# Patient Record
Sex: Male | Born: 1960 | Race: White | Hispanic: No | Marital: Married | Smoking: Former smoker
Health system: Southern US, Community
[De-identification: ages and names within clinical notes are randomized; demographics above are authoritative.]

## PROBLEM LIST (undated history)

## (undated) DIAGNOSIS — D649 Anemia, unspecified: Secondary | ICD-10-CM

## (undated) DIAGNOSIS — E119 Type 2 diabetes mellitus without complications: Secondary | ICD-10-CM

## (undated) DIAGNOSIS — E785 Hyperlipidemia, unspecified: Secondary | ICD-10-CM

## (undated) DIAGNOSIS — K219 Gastro-esophageal reflux disease without esophagitis: Secondary | ICD-10-CM

## (undated) DIAGNOSIS — K449 Diaphragmatic hernia without obstruction or gangrene: Secondary | ICD-10-CM

## (undated) DIAGNOSIS — F32A Depression, unspecified: Secondary | ICD-10-CM

## (undated) DIAGNOSIS — I10 Essential (primary) hypertension: Secondary | ICD-10-CM

## (undated) DIAGNOSIS — R413 Other amnesia: Secondary | ICD-10-CM

## (undated) DIAGNOSIS — F329 Major depressive disorder, single episode, unspecified: Secondary | ICD-10-CM

## (undated) DIAGNOSIS — R569 Unspecified convulsions: Secondary | ICD-10-CM

## (undated) DIAGNOSIS — J439 Emphysema, unspecified: Secondary | ICD-10-CM

## (undated) DIAGNOSIS — E079 Disorder of thyroid, unspecified: Secondary | ICD-10-CM

## (undated) HISTORY — PX: DENTAL SURGERY: SHX609

## (undated) HISTORY — DX: Other amnesia: R41.3

## (undated) HISTORY — PX: TONSILLECTOMY AND ADENOIDECTOMY: SHX28

## (undated) HISTORY — PX: EYE SURGERY: SHX253

## (undated) HISTORY — PX: APPENDECTOMY: SHX54

---

## 1999-12-27 ENCOUNTER — Inpatient Hospital Stay (HOSPITAL_COMMUNITY): Admission: AD | Admit: 1999-12-27 | Discharge: 2000-01-01 | Payer: Self-pay | Admitting: *Deleted

## 2003-07-29 ENCOUNTER — Inpatient Hospital Stay (HOSPITAL_COMMUNITY): Admission: EM | Admit: 2003-07-29 | Discharge: 2003-08-03 | Payer: Self-pay | Admitting: Emergency Medicine

## 2003-08-03 ENCOUNTER — Inpatient Hospital Stay (HOSPITAL_COMMUNITY): Admission: EM | Admit: 2003-08-03 | Discharge: 2003-08-10 | Payer: Self-pay | Admitting: Psychiatry

## 2005-07-12 ENCOUNTER — Emergency Department: Payer: Self-pay | Admitting: Emergency Medicine

## 2006-02-19 ENCOUNTER — Ambulatory Visit: Payer: Self-pay | Admitting: Internal Medicine

## 2010-03-01 ENCOUNTER — Ambulatory Visit (HOSPITAL_BASED_OUTPATIENT_CLINIC_OR_DEPARTMENT_OTHER): Admission: RE | Admit: 2010-03-01 | Discharge: 2010-03-01 | Payer: Self-pay | Admitting: Ophthalmology

## 2010-03-04 ENCOUNTER — Ambulatory Visit: Payer: Self-pay | Admitting: Internal Medicine

## 2011-02-09 NOTE — Discharge Summary (Signed)
NAME:  [name], [name] NO.:  [id number]   MEDICAL RECORD NO.:  [id number]                   PATIENT TYPE:  IPS   LOCATION:  0304                                 FACILITY:  [facility]   PHYSICIAN:  Jeanice Lim, M.D.              DATE OF BIRTH:  03/22/1961   DATE OF ADMISSION:  08/03/2003  DATE OF DISCHARGE:  08/10/2003                                 DISCHARGE SUMMARY   IDENTIFYING DATA:  This is a 50 year old Caucasian  male, married,  voluntarily admitted with a history of  opiate addiction, presented to the  emergency room with chest and abdominal pain, history of communicating  threats to a questionable  daughter. He says that they called him stupid.  The patient had been behaving erratically. He had been accused of possibly  stealing morphine. The patient denied any suicidal or homicidal ideation and  has been taking Xanax, which he reports is everywhere, possibly getting from  the street.   FAMILY HISTORY:  Bipolar.   MEDICATIONS:  Synthroid, Xanax, Colace, Protonix, Lexapro, aspirin,  methadone 80 mg daily.   ALLERGIES:  PENICILLIN.   PHYSICAL EXAMINATION:  Within normal limits. Neurologically nonfocal.   LABORATORY DATA:  Routine admission labs within normal limits.   MENTAL STATUS EXAM:  Fully alert, disheveled, tearful, cooperative. Speech  within normal limits. Mood irritable, depressed, tearful. Thought process  goal directed, positive thought blocking at times. No clear suicidal  ideation. Questionably passively suicidal with no acute intent and no  psychotic symptoms. Cognitively intact. Judgment and insight fair to poor.   ADMISSION DIAGNOSIS:   AXIS I:  1. Major depressive disorder, rule out bipolar.  2. Rule out benzodiazepine dependence, possible withdrawal syndrome and     substance induced mood disorder.   AXIS II:  Deferred.   AXIS III:  1. Hypertension.  2. History of myocardial infarction.  3. Constipation.  4.  Hypothyroidism.   AXIS IV:  Severe. Family conflict, occupational problems, economic problems  and possibly legal problems.   AXIS V:  30/60.   HOSPITAL COURSE:  The patient was admitted and ordered routine p.r.n.  medications and underwent further monitoring. He was encouraged to  participate in individual, group and milieu therapy. The patient was  optimized on medications, resumed on medical medications and placed on detox  protocol. The patient reported stabilization of mood with no mood swings, no  suicidal or homicidal ideation or psychotic symptoms and no side-effects  from medications.   His condition on discharge was improved. His mood was more euthymic, affect  brighter, thought process is goal directed, thought content negative for any  psychotic symptoms or dangerous ideation. The wife was informed and was  comfortable with discharge  plan.   DISCHARGE MEDICATIONS:  1. Protonix 40 mg q.a.m.  2. Colace 100 mg 1 b.i.d.  3. Depakote ER 250 mg b.i.d. and 2 q.h.s.  4. Risperdal  2 mg q.h.s.  5. Levothyroxine 200 mcg q.a.m.  6. Avapro 150 mg q.a.m.  7. Hydrochlorothiazide 12.5 mg q.a.m.  8. Lexapro 10 mg 1/2  q.a.m.  9. Aspirin 81 mg q.a.m.   FOLLOW UP:  The patient was discharged to follow up with [facility], with a  substance abuse  therapist.   DISCHARGE DIAGNOSES:   AXIS I:  1. Major depressive disorder, rule out bipolar.  2. Rule out benzodiazepine dependence, possible withdrawal syndrome and     substance induced mood disorder.   AXIS II:  Deferred.   AXIS III:  1. Hypertension.  2. History of myocardial infarction.  3. Constipation.  4. Hypothyroidism.   AXIS IV:  Severe. Family conflict, occupational problems, economic problems  and possibly legal problems.   AXIS VZachary Ayush, M.D.    JEM/MEDQ  D:  08/29/2003  T:  08/30/2003  Job:  [id number]

## 2011-02-09 NOTE — H&P (Signed)
NAME:  [name], [name] NO.:  [id number]   MEDICAL RECORD NO.:  [id number]                   PATIENT TYPE:  INP   LOCATION:  0357                                 FACILITY:  [facility]   PHYSICIAN:  Melissa L. Ladona Ridgel, MD               DATE OF BIRTH:  May 09, 1961   DATE OF ADMISSION:  07/29/2003  DATE OF DISCHARGE:                                HISTORY & PHYSICAL   PRIMARY CARE PHYSICIAN:  The patient is unable to identify his [facility] PCP.  He does, however, see Dr. Evelene Croon for his psychiatric care.   CHIEF COMPLAINT:  Abdominal pain and nausea.   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  He presents to the emergency room  with the complaint of intense abdominal pain without nausea, vomiting,  diarrhea, fever, or chills.  He is a very poor historian who is unable to  report when the pain started or rate the pain according to a 10 pain scale.  He denies shortness of breath, PND, or orthopnea.  He has pertinent family  history for stroke and MI in family members at a young age.  The patient is  obviously depressed, with psychomotor retardation, memory deficit, and  tearfulness.  He denies homicidal or suicidal ideation.  He is, however,  very tearful.  He was given permission to contact his wife, Darl Pikes, through a  family friend by the name of Kirstie Peri at [employer], telephone  number [phone].   PAST MEDICAL HISTORY:  1. Hypothyroidism.  2. Hypertension.  3. Depression.  4. Trauma to the lower extremities.   PAST SURGICAL HISTORY:  None.   ALLERGIES:  PENICILLIN.   MEDICATIONS:  1. Synthroid 100 mcg daily.  2. Wellbutrin XL 100 mg daily.  3. Xanax 2 mg daily.  4. Lexapro 10 mg q.h.s.  5. Diovan 80/12.5 mg daily.  6. Methadone 85 mg daily.   SOCIAL HISTORY:  The patient relates that he  smokes about three packs per  day of tobacco.  He started that nine years ago.  He denies any ethanol  abuse.  No IVDA or illicit drug use.   FAMILY HISTORY:  His father is living.  He is status post MI in his 8s and  presently is recovering from CABG.  His mother is deceased secondary to  stroke at age 69.  He has a brother who is alive but living with mental  retardation.   REVIEW OF SYSTEMS:  The patient expresses depression.  Abdominal pain which  radiates to the side.  He states he has chronic pain with urination and  generally has trouble initiating his stream.  He states this has been since  a child.  He denies shortness of breath, diarrhea, melena, hematochezia.  All other  review of systems are negative.   LABORATORY VALUES:  On admission reveal white count of 8, hemoglobin of  11.3, hematocrit of 32.5, and platelet count of 205.  His basic metabolic  panel reveals a BUN of 35, creatinine of 1.5, glucose of 93.  All other  values are within normal parameters.  Liver function tests are within normal  parameters.  His lipase is 22.  His UA is negative.  His tox screen is  positive for benzodiazepines.  His first set of cardiac enzymes revealed a  CK total of 1378 and a CK-MB of 12.7 and troponin of less than 0.01.   EKG shows heart rate of 83, with no ST and T-wave changes and normal sinus  rhythm.   I was able to contact the patient's wife of seven years.  Her cell phone  number is [phone].  Her work number is [phone].  She related that the  patient developed acute onset of verbally abusive behavior, which generally  addressed issues surrounding his daughter's recent pregnancy loss.  His  threats were specific enough and scary enough that his daughter took out a  warrant against him, alleging terroristic and violent threats.  He also  behaves in an inappropriate manner in front of his wife's employer, accusing  his wife of stealing morphine from the vet where she works.   The patient was  served a warrant and taken before a physician for evaluation.  He was  released as a [employer] broadcasting/film/video.  His wife alleges that she initiated an  involuntary commitment in [place].  It is unclear whether the two events  described are the same event.  The patient relates that the patient himself  has acted this way only once before, and that was when he was taking crystal  meth.  She is not aware that he has been taking anything other than his  methadone which he gets at the [facility] [place].  According to the  patient's wife, the patient takes 85 mg daily of methadone.  The current  telephone number for the [facility] is [phone].  Interestingly enough, the  patient's drug screen is negative for opiates.  The patient's wife relates  that there is a family history of bipolar disease which was related to her  from the patient's stepfather.  The patient's wife confirmed his statements  regarding small strokes and complaints of abdominal pain for at least two  days.  The patient's wife states that she has recently applied for Medicare  for them both, and this was initiated on June 24, 2003, but the  paperwork is yet to be completed.   PHYSICAL EXAM:  VITAL SIGNS:  Temperature 98.4, blood pressure 123/83, with  earlier readings in the high 90s systolic.  Pulse is 82, respirations are  20.  He is 97% on room air.  GENERAL:  Well-developed, well-nourished white male in mild distress  secondary to abdominal pain.  HEENT:  Pupils are equal, round, reactive to light.  His extraocular muscles  are intact.  His mucous membranes are moist.  He is anicteric.  There are no  oral or nasal lesions.  NECK:  Supple.  There is no JVD, no lymph node, no bruits, no macroglossia.  CHEST:  Mild rhonchi which clear with cough.  Occasionally there is a wheeze  best heard anteriorly and over the right neck.  There does appear to be an asymmetry to the right lobe of the thyroid which is palpably larger  than  the  left.  CARDIOVASCULAR:  Regular rate and rhythm.  Positive S1, S2.  No S3 or S4.  No murmurs, rubs, or gallops.  ABDOMEN:  Soft.  Mildly tender in the midepigastric area.  Nondistended,  with positive bowel sounds.  EXTREMITIES:  Revealed 2+ pulses with no edema and no other lesions.  NEUROLOGIC:  Cranial nerves II-XII are intact.  He is awake but not oriented  to time or place.  Power is 5/5 with deep tendon reflexes that are 1 in all  extremities and are symmetrical.   ASSESSMENT AND PLAN:  This is a 50 year old white male with an unusual  affect, memory loss, depression, tearfulness, who presents with atypical  abdominal pain that may represent anginal equivalent.  He will be admitted  to telemetry to rule out myocardial infarction.  We will assess his  abdominal source for his atypical abdominal pain with a CT of his abdomen.  We will also evaluate his mental health issues trending the last 48 hours.   1. Cardiovascular:  We will obtain serial enzymes and place the patient on     telemetry to rule out MI.  We will continue his Diovan,     hydrochlorothiazide if his blood pressure holds greater than a systolic     of 100.  We will also check his TSH.  2. Endocrine:  Continue on Synthroid.  We will follow up his TSH.  If the     patient continues to deteriorate we will consider myxedema coma,  which     would also alter our therapy.  3. The patient is opiate dependent on methadone; however, his drug screen     here in the ER shows no opiates which raises the question as to where his     medications are going.  We will call [facility] in the morning to confirm the     patient's enrollment in the drug program.  4. 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  A head CT will be     examined while the patient is undergoing abdominal CT to ascertain     whether there is any new finding that could be  consistent for stroke or     mass effect.  5. Psychiatric:  The patient is obviously depressed, with recent violent     threats.  It is unclear whether there is a physical source for this or a     psychiatric cause for this.  We will at this time continue his Lexapro,     his p.r.n. Ativan.  Continue his Wellbutrin, and Dr. Jeanie Sewer has been     consulted to see him in the morning.  We will not hesitate to pursue an     involuntary commitment should the patient repeat his behavior of the last     48 hours with verbal abusiveness.  6. Abdominal pain:  Question of whether or not this is constipation versus     opiate withdrawal versus another etiology.  Abdominal CT is pending.                                               Melissa L. Ladona Ridgel, MD    MLT/MEDQ  D:  07/30/2003  T:  07/30/2003  Job:  [id number]

## 2011-02-09 NOTE — Discharge Summary (Signed)
NAME:  [name] NO.:  [id number]   MEDICAL RECORD NO.:  [id number]                   PATIENT TYPE:  INP   LOCATION:  0357                                 FACILITY:  [facility]   PHYSICIAN:  Deirdre Peer. Polite, M.D.              DATE OF BIRTH:  1961-06-08   DATE OF ADMISSION:  07/29/2003  DATE OF DISCHARGE:  08/03/2003                                 DISCHARGE SUMMARY   DISCHARGE DIAGNOSES:  1. Abdominal pain secondary to constipation.  2. Severe hypothyroidism.  3. Hypertension.  4. Depression.  5. Elevated CK with normal troponin I probably secondary to severe     hypothyroidism.   DISCHARGE MEDICATIONS:  1. Protonix 40 mg every day.  2. Methadone 30 mg every day.  3. Synthroid 25 mcg every day.  4. Lactulose 30 mg b.i.d.  5. Lexapro 5 mg every day.   CONSULTATIONS:  Dr. Jeanie Sewer, Psychiatry.   DISPOSITION:  To [facility].   HISTORY OF PRESENT ILLNESS:  This 50 year old male with history of above  medical problems including mini strokes which resulted in __________  presented to the ED with complaint of intense abdominal pain without nausea,  vomiting, diarrhea, fever, or chills.  As stated, the patient is a poor  historian with a questionable history of mini strokes.  In the ED, the  patient was seen and evaluated, and admission was deemed necessary for  further evaluation and treatment.  Please see dictated report for further  details of history of present illness.   HOSPITAL COURSE:  Problem #1:  Severe abdominal pain.  As stated, this was  the patient's presenting complaint.  The patient has several studies for  evaluation of this.  Amylase, lipase, LFTs which were within normal limits.  Patient had x-ray and ultimately CAT scan which was consistent with  constipation.  Patient was treated with laxative with good result.  There  was no further evaluation for the patient's abdominal pain.  Patient also  had severe  hypothyroidism.  This was most likely secondary to noncompliance  and this was probably a big contributing factor to his constipation.   Problem #2:  Severe hypothyroidism.  The patient's TSH was 33.4.  The  patient was initially on 100 mcg of Synthroid which was corrected to  __________.   Problem #2:  Psych disorder.  The patient was seen in consultation by  Psychiatry.  There were several consultations with the patient's wife and  there was concerns for the patient's mental stability.  After being seen by  Dr. Jeanie Sewer has recommended inpatient evaluation be undertaken.  The  patient was ultimately transferred to [facility].   Problem #3:  Elevated CK with normal troponin I and normal EKG.  Patient was  without any cardiac complaint.  It was felt the elevation in his CK was  secondary to his severe hypothyroidism.  No further evaluation was  undertaken.   Problem #4:  Narcotic dependence.  Patient was continued on baseline dose of  methadone he was receiving on an outpatient basis.   Problem #5:  Hypertension.  As stated, the patient carries a history of  hypertension, however, had been noncompliant with his medication on an  outpatient basis.  Was to re-institute his medication.  He had bouts of  hypotension and he occasionally complained of feeling weak and having  headaches.  Therefore BP meds were held and the patient's BP is  approximately 105/70.  At this time the patient is discharged without  hypertensive medications.                                               Deirdre Peer. Polite, M.D.    RDP/MEDQ  D:  08/17/2003  T:  08/17/2003  Job:  [id number]

## 2011-10-15 ENCOUNTER — Other Ambulatory Visit (HOSPITAL_COMMUNITY): Payer: Self-pay | Admitting: Radiology

## 2011-10-15 DIAGNOSIS — R609 Edema, unspecified: Secondary | ICD-10-CM

## 2011-10-16 ENCOUNTER — Ambulatory Visit (HOSPITAL_COMMUNITY): Payer: Medicare Other | Attending: Cardiology | Admitting: Radiology

## 2011-10-16 DIAGNOSIS — F17200 Nicotine dependence, unspecified, uncomplicated: Secondary | ICD-10-CM | POA: Insufficient documentation

## 2011-10-16 DIAGNOSIS — I10 Essential (primary) hypertension: Secondary | ICD-10-CM | POA: Insufficient documentation

## 2011-10-16 DIAGNOSIS — R609 Edema, unspecified: Secondary | ICD-10-CM | POA: Insufficient documentation

## 2011-10-16 DIAGNOSIS — Z8249 Family history of ischemic heart disease and other diseases of the circulatory system: Secondary | ICD-10-CM | POA: Insufficient documentation

## 2011-10-16 DIAGNOSIS — E785 Hyperlipidemia, unspecified: Secondary | ICD-10-CM | POA: Insufficient documentation

## 2011-10-16 DIAGNOSIS — E119 Type 2 diabetes mellitus without complications: Secondary | ICD-10-CM | POA: Insufficient documentation

## 2011-10-17 ENCOUNTER — Encounter (HOSPITAL_COMMUNITY): Payer: Self-pay | Admitting: Family Medicine

## 2012-04-02 ENCOUNTER — Emergency Department (HOSPITAL_COMMUNITY)
Admission: EM | Admit: 2012-04-02 | Discharge: 2012-04-02 | Disposition: A | Discharge status: Home / Self Care | Payer: Medicare Other | Attending: Emergency Medicine | Admitting: Emergency Medicine

## 2012-04-02 ENCOUNTER — Encounter (HOSPITAL_COMMUNITY): Payer: Self-pay

## 2012-04-02 DIAGNOSIS — R079 Chest pain, unspecified: Secondary | ICD-10-CM | POA: Insufficient documentation

## 2012-04-02 NOTE — ED Notes (Signed)
sts chest pain on left side, describes as cyst on back and arm. Sharp in nature. Then feels abn heart beats.

## 2014-03-03 ENCOUNTER — Inpatient Hospital Stay (HOSPITAL_COMMUNITY)
Admission: EM | Admit: 2014-03-03 | Discharge: 2014-03-05 | DRG: 871 | Disposition: A | Discharge status: Home / Self Care | Payer: Medicare Other | Attending: Internal Medicine | Admitting: Internal Medicine

## 2014-03-03 ENCOUNTER — Emergency Department (HOSPITAL_COMMUNITY): Payer: Medicare Other

## 2014-03-03 ENCOUNTER — Encounter (HOSPITAL_COMMUNITY): Payer: Self-pay | Admitting: Emergency Medicine

## 2014-03-03 DIAGNOSIS — F3289 Other specified depressive episodes: Secondary | ICD-10-CM | POA: Diagnosis present

## 2014-03-03 DIAGNOSIS — F17200 Nicotine dependence, unspecified, uncomplicated: Secondary | ICD-10-CM | POA: Diagnosis present

## 2014-03-03 DIAGNOSIS — J811 Chronic pulmonary edema: Secondary | ICD-10-CM | POA: Diagnosis present

## 2014-03-03 DIAGNOSIS — F32A Depression, unspecified: Secondary | ICD-10-CM | POA: Diagnosis present

## 2014-03-03 DIAGNOSIS — F411 Generalized anxiety disorder: Secondary | ICD-10-CM | POA: Diagnosis present

## 2014-03-03 DIAGNOSIS — E875 Hyperkalemia: Secondary | ICD-10-CM | POA: Diagnosis present

## 2014-03-03 DIAGNOSIS — D649 Anemia, unspecified: Secondary | ICD-10-CM | POA: Diagnosis present

## 2014-03-03 DIAGNOSIS — I959 Hypotension, unspecified: Secondary | ICD-10-CM

## 2014-03-03 DIAGNOSIS — E039 Hypothyroidism, unspecified: Secondary | ICD-10-CM | POA: Diagnosis present

## 2014-03-03 DIAGNOSIS — K089 Disorder of teeth and supporting structures, unspecified: Secondary | ICD-10-CM | POA: Diagnosis present

## 2014-03-03 DIAGNOSIS — K219 Gastro-esophageal reflux disease without esophagitis: Secondary | ICD-10-CM | POA: Diagnosis present

## 2014-03-03 DIAGNOSIS — N179 Acute kidney failure, unspecified: Secondary | ICD-10-CM | POA: Diagnosis present

## 2014-03-03 DIAGNOSIS — L0201 Cutaneous abscess of face: Secondary | ICD-10-CM | POA: Diagnosis present

## 2014-03-03 DIAGNOSIS — A419 Sepsis, unspecified organism: Principal | ICD-10-CM | POA: Diagnosis present

## 2014-03-03 DIAGNOSIS — R4182 Altered mental status, unspecified: Secondary | ICD-10-CM

## 2014-03-03 DIAGNOSIS — R6520 Severe sepsis without septic shock: Secondary | ICD-10-CM | POA: Diagnosis present

## 2014-03-03 DIAGNOSIS — Z7982 Long term (current) use of aspirin: Secondary | ICD-10-CM

## 2014-03-03 DIAGNOSIS — L03211 Cellulitis of face: Secondary | ICD-10-CM | POA: Diagnosis present

## 2014-03-03 DIAGNOSIS — I10 Essential (primary) hypertension: Secondary | ICD-10-CM | POA: Diagnosis present

## 2014-03-03 DIAGNOSIS — E785 Hyperlipidemia, unspecified: Secondary | ICD-10-CM | POA: Diagnosis present

## 2014-03-03 DIAGNOSIS — J438 Other emphysema: Secondary | ICD-10-CM | POA: Diagnosis present

## 2014-03-03 DIAGNOSIS — K044 Acute apical periodontitis of pulpal origin: Secondary | ICD-10-CM | POA: Diagnosis present

## 2014-03-03 DIAGNOSIS — F329 Major depressive disorder, single episode, unspecified: Secondary | ICD-10-CM | POA: Diagnosis present

## 2014-03-03 DIAGNOSIS — G9340 Encephalopathy, unspecified: Secondary | ICD-10-CM | POA: Diagnosis present

## 2014-03-03 DIAGNOSIS — K047 Periapical abscess without sinus: Secondary | ICD-10-CM

## 2014-03-03 DIAGNOSIS — E861 Hypovolemia: Secondary | ICD-10-CM | POA: Diagnosis present

## 2014-03-03 DIAGNOSIS — Z79899 Other long term (current) drug therapy: Secondary | ICD-10-CM

## 2014-03-03 DIAGNOSIS — E119 Type 2 diabetes mellitus without complications: Secondary | ICD-10-CM | POA: Diagnosis present

## 2014-03-03 DIAGNOSIS — E8779 Other fluid overload: Secondary | ICD-10-CM | POA: Diagnosis present

## 2014-03-03 DIAGNOSIS — G40909 Epilepsy, unspecified, not intractable, without status epilepticus: Secondary | ICD-10-CM | POA: Diagnosis present

## 2014-03-03 HISTORY — DX: Hyperlipidemia, unspecified: E78.5

## 2014-03-03 HISTORY — DX: Unspecified convulsions: R56.9

## 2014-03-03 HISTORY — DX: Diaphragmatic hernia without obstruction or gangrene: K44.9

## 2014-03-03 HISTORY — DX: Depression, unspecified: F32.A

## 2014-03-03 HISTORY — DX: Emphysema, unspecified: J43.9

## 2014-03-03 HISTORY — DX: Major depressive disorder, single episode, unspecified: F32.9

## 2014-03-03 HISTORY — DX: Gastro-esophageal reflux disease without esophagitis: K21.9

## 2014-03-03 HISTORY — DX: Essential (primary) hypertension: I10

## 2014-03-03 HISTORY — DX: Disorder of thyroid, unspecified: E07.9

## 2014-03-03 HISTORY — DX: Anemia, unspecified: D64.9

## 2014-03-03 HISTORY — DX: Type 2 diabetes mellitus without complications: E11.9

## 2014-03-03 LAB — CBC
HEMATOCRIT: 31.2 % — AB (ref 39.0–52.0)
Hemoglobin: 10.1 g/dL — ABNORMAL LOW (ref 13.0–17.0)
MCH: 31.3 pg (ref 26.0–34.0)
MCHC: 32.4 g/dL (ref 30.0–36.0)
MCV: 96.6 fL (ref 78.0–100.0)
PLATELETS: 136 10*3/uL — AB (ref 150–400)
RBC: 3.23 MIL/uL — ABNORMAL LOW (ref 4.22–5.81)
RDW: 14.9 % (ref 11.5–15.5)
WBC: 8 10*3/uL (ref 4.0–10.5)

## 2014-03-03 LAB — CREATININE, SERUM
Creatinine, Ser: 1.68 mg/dL — ABNORMAL HIGH (ref 0.50–1.35)
GFR calc Af Amer: 51 mL/min — ABNORMAL LOW (ref >90)
GFR, EST NON AFRICAN AMERICAN: 44 mL/min — AB (ref >90)

## 2014-03-03 LAB — CBC WITH DIFFERENTIAL/PLATELET
BASOS ABS: 0 10*3/uL (ref 0.0–0.1)
Basophils Relative: 0 % (ref 0–1)
EOS PCT: 0 % (ref 0–5)
Eosinophils Absolute: 0 10*3/uL (ref 0.0–0.7)
HCT: 35.6 % — ABNORMAL LOW (ref 39.0–52.0)
Hemoglobin: 11.5 g/dL — ABNORMAL LOW (ref 13.0–17.0)
LYMPHS PCT: 40 % (ref 12–46)
Lymphs Abs: 4.3 10*3/uL — ABNORMAL HIGH (ref 0.7–4.0)
MCH: 30.7 pg (ref 26.0–34.0)
MCHC: 32.3 g/dL (ref 30.0–36.0)
MCV: 94.9 fL (ref 78.0–100.0)
MONO ABS: 1.7 10*3/uL — AB (ref 0.1–1.0)
Monocytes Relative: 16 % — ABNORMAL HIGH (ref 3–12)
NEUTROS ABS: 4.7 10*3/uL (ref 1.7–7.7)
Neutrophils Relative %: 44 % (ref 43–77)
PLATELETS: 160 10*3/uL (ref 150–400)
RBC: 3.75 MIL/uL — ABNORMAL LOW (ref 4.22–5.81)
RDW: 14.6 % (ref 11.5–15.5)
WBC: 10.8 10*3/uL — AB (ref 4.0–10.5)

## 2014-03-03 LAB — BASIC METABOLIC PANEL
BUN: 40 mg/dL — ABNORMAL HIGH (ref 6–23)
CALCIUM: 9.4 mg/dL (ref 8.4–10.5)
CHLORIDE: 95 meq/L — AB (ref 96–112)
CO2: 27 meq/L (ref 19–32)
CREATININE: 2.1 mg/dL — AB (ref 0.50–1.35)
GFR calc non Af Amer: 34 mL/min — ABNORMAL LOW (ref >90)
GFR, EST AFRICAN AMERICAN: 39 mL/min — AB (ref >90)
Glucose, Bld: 106 mg/dL — ABNORMAL HIGH (ref 70–99)
Potassium: 5.7 mEq/L — ABNORMAL HIGH (ref 3.7–5.3)
SODIUM: 136 meq/L — AB (ref 137–147)

## 2014-03-03 LAB — RAPID URINE DRUG SCREEN, HOSP PERFORMED
AMPHETAMINES: NOT DETECTED
BARBITURATES: NOT DETECTED
BENZODIAZEPINES: POSITIVE — AB
Cocaine: NOT DETECTED
Opiates: NOT DETECTED
TETRAHYDROCANNABINOL: NOT DETECTED

## 2014-03-03 LAB — CBG MONITORING, ED
GLUCOSE-CAPILLARY: 92 mg/dL (ref 70–99)
Glucose-Capillary: 112 mg/dL — ABNORMAL HIGH (ref 70–99)

## 2014-03-03 LAB — URINALYSIS, ROUTINE W REFLEX MICROSCOPIC
Bilirubin Urine: NEGATIVE
Glucose, UA: NEGATIVE mg/dL
Hgb urine dipstick: NEGATIVE
KETONES UR: NEGATIVE mg/dL
LEUKOCYTES UA: NEGATIVE
NITRITE: NEGATIVE
PROTEIN: NEGATIVE mg/dL
Specific Gravity, Urine: 1.014 (ref 1.005–1.030)
UROBILINOGEN UA: 0.2 mg/dL (ref 0.0–1.0)
pH: 5 (ref 5.0–8.0)

## 2014-03-03 LAB — COMPREHENSIVE METABOLIC PANEL
ALK PHOS: 31 U/L — AB (ref 39–117)
ALT: 8 U/L (ref 0–53)
AST: 12 U/L (ref 0–37)
Albumin: 3 g/dL — ABNORMAL LOW (ref 3.5–5.2)
BUN: 38 mg/dL — ABNORMAL HIGH (ref 6–23)
CO2: 27 mEq/L (ref 19–32)
Calcium: 8.3 mg/dL — ABNORMAL LOW (ref 8.4–10.5)
Chloride: 103 mEq/L (ref 96–112)
Creatinine, Ser: 1.9 mg/dL — ABNORMAL HIGH (ref 0.50–1.35)
GFR calc non Af Amer: 38 mL/min — ABNORMAL LOW (ref >90)
GFR, EST AFRICAN AMERICAN: 44 mL/min — AB (ref >90)
GLUCOSE: 92 mg/dL (ref 70–99)
POTASSIUM: 5.4 meq/L — AB (ref 3.7–5.3)
SODIUM: 139 meq/L (ref 137–147)
TOTAL PROTEIN: 6.2 g/dL (ref 6.0–8.3)
Total Bilirubin: <0.2 mg/dL — ABNORMAL LOW (ref 0.3–1.2)

## 2014-03-03 LAB — SALICYLATE LEVEL: Salicylate Lvl: <2 mg/dL — ABNORMAL LOW (ref 2.8–20.0)

## 2014-03-03 LAB — ETHANOL: Alcohol, Ethyl (B): <11 mg/dL (ref 0–11)

## 2014-03-03 LAB — LACTIC ACID, PLASMA: LACTIC ACID, VENOUS: 0.6 mmol/L (ref 0.5–2.2)

## 2014-03-03 LAB — TSH: TSH: 0.372 u[IU]/mL (ref 0.350–4.500)

## 2014-03-03 LAB — GLUCOSE, CAPILLARY: Glucose-Capillary: 89 mg/dL (ref 70–99)

## 2014-03-03 LAB — VALPROIC ACID LEVEL: VALPROIC ACID LVL: 70.9 ug/mL (ref 50.0–100.0)

## 2014-03-03 LAB — I-STAT CG4 LACTIC ACID, ED: Lactic Acid, Venous: 1.62 mmol/L (ref 0.5–2.2)

## 2014-03-03 MED ORDER — ALBUTEROL SULFATE (2.5 MG/3ML) 0.083% IN NEBU: 2.5000 mg | INHALATION_SOLUTION | RESPIRATORY_TRACT | Status: DC | PRN

## 2014-03-03 MED ORDER — CLINDAMYCIN PHOSPHATE 600 MG/50ML IV SOLN
600.0000 mg | Freq: Once | INTRAVENOUS | Status: AC
  Administered 2014-03-03: 600 mg via INTRAVENOUS
  Filled 2014-03-03: qty 50

## 2014-03-03 MED ORDER — SODIUM CHLORIDE 0.9 % IV SOLN
INTRAVENOUS | Status: DC
  Administered 2014-03-03: 22:00:00 via INTRAVENOUS
  Administered 2014-03-04: 100 mL/h via INTRAVENOUS

## 2014-03-03 MED ORDER — PANTOPRAZOLE SODIUM 40 MG IV SOLR
40.0000 mg | INTRAVENOUS | Status: DC
  Administered 2014-03-03 – 2014-03-04 (×2): 40 mg via INTRAVENOUS
  Filled 2014-03-03 (×2): qty 40

## 2014-03-03 MED ORDER — SODIUM CHLORIDE 0.9 % IV BOLUS (SEPSIS)
1000.0000 mL | Freq: Once | INTRAVENOUS | Status: AC
  Administered 2014-03-03: 1000 mL via INTRAVENOUS

## 2014-03-03 MED ORDER — SODIUM CHLORIDE 0.9 % IV BOLUS (SEPSIS)
1000.0000 mL | Freq: Once | INTRAVENOUS | Status: AC
Start: 2014-03-03 — End: 2014-03-03
  Administered 2014-03-03: 1000 mL via INTRAVENOUS

## 2014-03-03 MED ORDER — SODIUM POLYSTYRENE SULFONATE 15 GM/60ML PO SUSP
30.0000 g | Freq: Once | ORAL | Status: DC
  Filled 2014-03-03: qty 120

## 2014-03-03 MED ORDER — CIPROFLOXACIN IN D5W 400 MG/200ML IV SOLN
400.0000 mg | Freq: Two times a day (BID) | INTRAVENOUS | Status: DC
  Administered 2014-03-04 – 2014-03-05 (×3): 400 mg via INTRAVENOUS
  Filled 2014-03-03 (×5): qty 200

## 2014-03-03 MED ORDER — SODIUM CHLORIDE 0.9 % IV SOLN: 250.0000 mL | INTRAVENOUS | Status: DC | PRN

## 2014-03-03 MED ORDER — BIOTENE DRY MOUTH MT LIQD
15.0000 mL | Freq: Two times a day (BID) | OROMUCOSAL | Status: DC
  Administered 2014-03-04 – 2014-03-05 (×3): 15 mL via OROMUCOSAL

## 2014-03-03 MED ORDER — LEVOTHYROXINE SODIUM 100 MCG IV SOLR
87.5000 ug | Freq: Every day | INTRAVENOUS | Status: DC
  Administered 2014-03-03 – 2014-03-05 (×3): 87.5 ug via INTRAVENOUS
  Filled 2014-03-03 (×3): qty 5

## 2014-03-03 MED ORDER — CHLORHEXIDINE GLUCONATE 0.12 % MT SOLN
15.0000 mL | Freq: Two times a day (BID) | OROMUCOSAL | Status: DC
  Administered 2014-03-03 – 2014-03-05 (×4): 15 mL via OROMUCOSAL
  Filled 2014-03-03 (×5): qty 15

## 2014-03-03 MED ORDER — VALPROATE SODIUM 500 MG/5ML IV SOLN
500.0000 mg | Freq: Two times a day (BID) | INTRAVENOUS | Status: DC
  Administered 2014-03-03 – 2014-03-05 (×4): 500 mg via INTRAVENOUS
  Filled 2014-03-03 (×5): qty 5

## 2014-03-03 MED ORDER — IPRATROPIUM-ALBUTEROL 0.5-2.5 (3) MG/3ML IN SOLN
3.0000 mL | Freq: Four times a day (QID) | RESPIRATORY_TRACT | Status: DC
  Administered 2014-03-03 – 2014-03-04 (×2): 3 mL via RESPIRATORY_TRACT
  Filled 2014-03-03 (×3): qty 3

## 2014-03-03 MED ORDER — CLINDAMYCIN PHOSPHATE 600 MG/50ML IV SOLN
600.0000 mg | Freq: Three times a day (TID) | INTRAVENOUS | Status: DC
  Administered 2014-03-04 – 2014-03-05 (×4): 600 mg via INTRAVENOUS
  Filled 2014-03-03 (×8): qty 50

## 2014-03-03 MED ORDER — CIPROFLOXACIN IN D5W 400 MG/200ML IV SOLN
400.0000 mg | Freq: Once | INTRAVENOUS | Status: AC
  Administered 2014-03-03: 400 mg via INTRAVENOUS

## 2014-03-03 MED ORDER — VANCOMYCIN HCL IN DEXTROSE 750-5 MG/150ML-% IV SOLN
750.0000 mg | Freq: Two times a day (BID) | INTRAVENOUS | Status: DC
  Administered 2014-03-04 – 2014-03-05 (×3): 750 mg via INTRAVENOUS
  Filled 2014-03-03 (×4): qty 150

## 2014-03-03 MED ORDER — INSULIN ASPART 100 UNIT/ML ~~LOC~~ SOLN
0.0000 [IU] | SUBCUTANEOUS | Status: DC
  Administered 2014-03-05 (×2): 3 [IU] via SUBCUTANEOUS

## 2014-03-03 MED ORDER — VANCOMYCIN HCL IN DEXTROSE 1-5 GM/200ML-% IV SOLN
1000.0000 mg | Freq: Once | INTRAVENOUS | Status: AC
  Administered 2014-03-03: 1000 mg via INTRAVENOUS
  Filled 2014-03-03: qty 200

## 2014-03-03 MED ORDER — HEPARIN SODIUM (PORCINE) 5000 UNIT/ML IJ SOLN
5000.0000 [IU] | Freq: Three times a day (TID) | INTRAMUSCULAR | Status: DC
  Administered 2014-03-03 – 2014-03-04 (×4): 5000 [IU] via SUBCUTANEOUS
  Filled 2014-03-03 (×8): qty 1

## 2014-03-03 NOTE — ED Notes (Signed)
Pt arrived by [facility] from pcp office. Was being seen for dental abscess and breakthrough seizures. Sent here due to low bp and altered mental status. Per family, pt has focal seizures and unsure of last one, reports confusion is normal for him. bp 100/48 on arrival.

## 2014-03-03 NOTE — ED Notes (Signed)
Dr Jeraldine Loots and resident aware of abnormal vital signs

## 2014-03-03 NOTE — ED Notes (Signed)
NOTIFIED DR. LOCKWOOD IN PERSON FOR PATIENTS LAB RESULTS OF CG4+ LACTIC ACID ,@16 :00 PM .03/03/2014.

## 2014-03-03 NOTE — ED Notes (Signed)
pts wife suzanne needs to leave and will come back later, her # [phone]

## 2014-03-03 NOTE — Progress Notes (Signed)
ANTIBIOTIC CONSULT NOTE - INITIAL  Pharmacy Consult for ciprofloxacin, vancomycin and clindamycin Indication: dental abscess/sepsis  Allergies  Allergen Reactions  . Penicillins Other (See Comments)    Reaction unknown    Patient Measurements: Height: 5\' 7"  (170.2 cm) Weight: 224 lb (101.606 kg) IBW/kg (Calculated) : 66.1   Vital Signs: Temp: 97.8 F (36.6 C) (06/10 1907) Temp src: Oral (06/10 1907) BP: 106/62 mmHg (06/10 2030) Pulse Rate: 69 (06/10 2030) Intake/Output from previous day:   Intake/Output from this shift:    Labs:  Recent Labs  03/03/14 1500 03/03/14 1733  WBC 10.8*  --   HGB 11.5*  --   PLT 160  --   CREATININE 2.10* 1.90*   Estimated Creatinine Clearance: 49.9 ml/min (by C-G formula based on Cr of 1.9). No results found for this basename: VANCOTROUGH, VANCOPEAK, VANCORANDOM, GENTTROUGH, GENTPEAK, GENTRANDOM, TOBRATROUGH, TOBRAPEAK, TOBRARND, AMIKACINPEAK, AMIKACINTROU, AMIKACIN,  in the last 72 hours   Microbiology: No results found for this or any previous visit (from the past 720 hour(s)).  Medical History: Past Medical History  Diagnosis Date  . Seizures   . Hypertension   . Emphysema of lung   . Depression   . Diabetes mellitus without complication   . Thyroid disease   . Hyperlipidemia   . Hiatal hernia   . Anemia   . GERD (gastroesophageal reflux disease)     Assessment: 33 YOM presenting with left mandible swelling and mouth/tooth pain x3 days. Found to have sepsis with WBC 10.8, LA 1.62, soft BP. Currently afebrile. SCr 1.9 with est CrCL ~60mL/min. Has already received vancomycin 1000mg  IV x1 and clindamycin 600mg  IV x1, ciprofloxacin 400mg  IV x1 has been ordered, but not yet given.   Goal of Therapy:  Vancomycin trough level 15-20 mcg/ml  Plan:  1. Vancomycin 750mg  IV q12h starting at 0900  2. Clindamycin 600mg  IV q8h starting at 0400 3. Ciprofloxacin 400mg  IV q12h starting at 0800 4. Follow c/s, LOT, renal function,  trough at [facility]  Arasely Akkerman D. Angelika Jerrett, PharmD, BCPS Clinical Pharmacist Pager: [phone] 03/03/2014 9:16 PM

## 2014-03-03 NOTE — H&P (Signed)
PULMONARY / CRITICAL CARE MEDICINE   Name: [name] MRN: [id number] DOB: 1961-03-06    ADMISSION DATE:  03/03/2014 CONSULTATION DATE:  03/03/2014  REFERRING MD :  EDP PRIMARY SERVICE: PCCM  CHIEF COMPLAINT:  Severe Sepsis  BRIEF PATIENT DESCRIPTION: 53 y.o. M brought to ED with left mandible swelling and mouth/tooth pain x 3 days.  In ED pt found to have severe sepsis secondary to dental infection that was revealed on CT.  PCCM was consulted for admission.  SIGNIFICANT EVENTS / STUDIES:  6/10 maxillofacial CT >>> Multiple areas of dental infection. Possible cellulitis related to dental infection left maxilla.  LINES / TUBES: None  CULTURES: Blood 6/10 >>>  ANTIBIOTICS: Cipro 6/10 >>> Vancomycin 6/10 >>> Clindamycin 6/10 >>>   HISTORY OF PRESENT ILLNESS: [name] is a 53 y.o. M with PMH as outlined below who went to his PCP 6/10 for left facial/mandible swelling and tooth/mouth pain x 3 days.  He had been having "hot flashes" and chills at home.  Denied cough, SOB, abd pain, N/V.  He states that symptoms worsened since initial onset prompting him to go in for evaluation.  PCP was concerned for dental abscess so sent him to the ED where a maxillofacial CT revealed multiple areas of dental infection with possible cellulitis related to infection in left maxilla. In ED, he was hypotensive with SBP in 70's and had mildly elevated WBC of 11.  He was given 3L IVF and started on empiric abx.  PCCM was consulted for admission.   PAST MEDICAL HISTORY :  Past Medical History  Diagnosis Date  . Seizures   . Hypertension   . Emphysema of lung   . Depression   . Diabetes mellitus without complication   . Thyroid disease   . Hyperlipidemia   . Hiatal hernia   . Anemia   . GERD (gastroesophageal reflux disease)    History reviewed. No pertinent past surgical history. Prior to Admission medications   Medication Sig Start Date End Date Taking? Authorizing Provider  albuterol  (PROVENTIL HFA;VENTOLIN HFA) 108 (90 BASE) MCG/ACT inhaler Inhale 2 puffs into the lungs every 4 (four) hours as needed for wheezing or shortness of breath.   Yes Historical Provider, MD  alprazolam Prudy Feeler(XANAX) 2 MG tablet Take 1 mg by mouth 4 (four) times daily.    Yes Historical Provider, MD  aspirin EC 81 MG tablet Take 81 mg by mouth daily.   Yes Historical Provider, MD  atorvastatin (LIPITOR) 40 MG tablet Take 40 mg by mouth daily.   Yes Historical Provider, MD  Choline Fenofibrate (FENOFIBRIC ACID) 135 MG CPDR Take 1 capsule by mouth daily.   Yes Historical Provider, MD  citalopram (CELEXA) 40 MG tablet Take 40 mg by mouth daily.   Yes Historical Provider, MD  clindamycin (CLEOCIN) 300 MG capsule Take 300 mg by mouth 3 (three) times daily.   Yes Historical Provider, MD  cyclobenzaprine (FLEXERIL) 10 MG tablet Take 10 mg by mouth 3 (three) times daily as needed for muscle spasms.   Yes Historical Provider, MD  divalproex (DEPAKOTE ER) 500 MG 24 hr tablet Take 500 mg by mouth 2 (two) times daily.   Yes Historical Provider, MD  furosemide (LASIX) 20 MG tablet Take 10-20 mg by mouth daily as needed for fluid.    Yes Historical Provider, MD  ketoconazole (NIZORAL) 2 % cream Apply 1 application topically daily.   Yes Historical Provider, MD  levothyroxine (SYNTHROID, LEVOTHROID) 175 MCG tablet Take 175 mcg  by mouth daily.   Yes Historical Provider, MD  lisinopril (PRINIVIL,ZESTRIL) 5 MG tablet Take 5 mg by mouth daily.   Yes Historical Provider, MD  metFORMIN (GLUCOPHAGE) 850 MG tablet Take 850 mg by mouth every morning.   Yes Historical Provider, MD  omeprazole (PRILOSEC) 20 MG capsule Take 20 mg by mouth daily.   Yes Historical Provider, MD  ondansetron (ZOFRAN) 4 MG tablet Take 4 mg by mouth every 8 (eight) hours as needed for nausea or vomiting.   Yes Historical Provider, MD  oxyCODONE-acetaminophen (PERCOCET/ROXICET) 5-325 MG per tablet Take 1 tablet by mouth every 8 (eight) hours as needed for  severe pain.   Yes Historical Provider, MD  tiotropium (SPIRIVA) 18 MCG inhalation capsule Place 18 mcg into inhaler and inhale daily.   Yes Historical Provider, MD  traZODone (DESYREL) 150 MG tablet Take 150 mg by mouth at bedtime.   Yes Historical Provider, MD   Allergies  Allergen Reactions  . Penicillins Other (See Comments)    Reaction unknown    FAMILY HISTORY:  History reviewed. No pertinent family history. SOCIAL HISTORY:  reports that he has been smoking.  He does not have any smokeless tobacco history on file. He reports that he does not drink alcohol or use illicit drugs.  REVIEW OF SYSTEMS:  All negative; except for those that are bolded, which indicate positives.  Constitutional: weight loss, weight gain, night sweats, fevers, chills, fatigue, weakness.  HEENT: headaches, sore throat, sneezing, nasal congestion, post nasal drip, difficulty swallowing, tooth/dental problems, visual complaints, visual changes, ear aches. Neuro: difficulty with speech, weakness, numbness, ataxia. CV:  chest pain, orthopnea, PND, swelling in lower extremities, dizziness, palpitations, syncope.  Resp: cough, hemoptysis, dyspnea, wheezing. GI  heartburn, indigestion, abdominal pain, nausea, vomiting, diarrhea, constipation, change in bowel habits, loss of appetite, hematemesis, melena, hematochezia.  GU: dysuria, change in color of urine, urgency or frequency, flank pain, hematuria. MSK: joint pain or swelling, decreased range of motion. Psych: change in mood or affect, depression, anxiety, suicidal ideations, homicidal ideations. Skin: rash, itching, bruising.   SUBJECTIVE:  Continued tooth/mouth pain though better than it was.  VITAL SIGNS: Temp:  [97.8 F (36.6 C)-98.9 F (37.2 C)] 97.8 F (36.6 C) (06/10 1907) Pulse Rate:  [71-79] 72 (06/10 1915) Resp:  [11-21] 13 (06/10 1915) BP: (76-102)/(41-63) 98/63 mmHg (06/10 1915) SpO2:  [94 %-100 %] 100 % (06/10 1915) HEMODYNAMICS:    VENTILATOR SETTINGS:   INTAKE / OUTPUT: Intake/Output     06/10 0701 - 06/11 0700   I.V. 3250   Total Intake 3250   Net +3250         PHYSICAL EXAMINATION: General: Obese male, resting in bed, in NAD. Neuro: A&O x 3, non-focal.  HEENT: Left mandibular swelling. Poor dental hygiene, left mandible/neck tender.  PERRL, sclerae anicteric. Cardiovascular: tachy but reg, no M/R/G.  Lungs: Respirations even and unlabored.  Faint wheezes. Abdomen: BS x 4, soft, NT/ND.  Musculoskeletal: No gross deformities, no edema.  Skin: Intact, warm, no rashes.    LABS:  CBC  Recent Labs Lab 03/03/14 1500  WBC 10.8*  HGB 11.5*  HCT 35.6*  PLT 160   Coag's No results found for this basename: APTT, INR,  in the last 168 hours BMET  Recent Labs Lab 03/03/14 1500 03/03/14 1733  NA 136* 139  K 5.7* 5.4*  CL 95* 103  CO2 27 27  BUN 40* 38*  CREATININE 2.10* 1.90*  GLUCOSE 106* 92   Electrolytes  Recent Labs Lab 03/03/14 1500 03/03/14 1733  CALCIUM 9.4 8.3*   Sepsis Markers  Recent Labs Lab 03/03/14 1553  LATICACIDVEN 1.62   ABG No results found for this basename: PHART, PCO2ART, PO2ART,  in the last 168 hours Liver Enzymes  Recent Labs Lab 03/03/14 1733  AST 12  ALT 8  ALKPHOS 31*  BILITOT <0.2*  ALBUMIN 3.0*   Cardiac Enzymes No results found for this basename: TROPONINI, PROBNP,  in the last 168 hours Glucose  Recent Labs Lab 03/03/14 1509 03/03/14 1554  GLUCAP 112* 92    Imaging Ct Head Wo Contrast  03/03/2014   CLINICAL DATA:  Seizure.  Dental infection.  EXAM: CT HEAD WITHOUT CONTRAST  CT MAXILLOFACIAL WITHOUT CONTRAST  TECHNIQUE: Multidetector CT imaging of the head and maxillofacial structures were performed using the standard protocol without intravenous contrast. Multiplanar CT image reconstructions of the maxillofacial structures were also generated.  COMPARISON:  CT head 07/30/2003  FINDINGS: CT HEAD FINDINGS  Ventricle size is normal.  Negative for acute infarct. Negative for hemorrhage or mass. No significant chronic ischemia. No skull abnormality.  CT MAXILLOFACIAL FINDINGS  Numerous dental caries are present. Numerous areas of periapical lucency are present around teeth. Lucency around tooth 11 with cortical breakthrough and surrounding soft tissue swelling compatible with cellulitis. Lucency around tooth and tooth 21 and 30. Soft tissue abscess not definitely present however no intravenous contrast was given which could limit detection of soft tissue abscess.  Negative for facial fracture. No air-fluid levels in the sinuses. Mild mucosal thickening in the paranasal sinuses.  IMPRESSION: No acute intracranial abnormality.  Negative for facial fracture.  Multiple areas of dental infection. Possible cellulitis related to dental infection left maxilla.   Electronically Signed   By: Marlan Palau M.D.   On: 03/03/2014 17:45   Dg Chest Port 1 View  03/03/2014   CLINICAL DATA:  Hypotension  EXAM: PORTABLE CHEST - 1 VIEW  COMPARISON:  None.  FINDINGS: Hypoventilation with decreased lung volume and bibasilar atelectasis. Vascular congestion may be due to hypoventilation. No edema or effusion.  IMPRESSION: Hypoventilation with low lung volumes and bibasilar atelectasis.   Electronically Signed   By: Marlan Palau M.D.   On: 03/03/2014 16:07   Ct Maxillofacial Wo Cm  03/03/2014   CLINICAL DATA:  Seizure.  Dental infection.  EXAM: CT HEAD WITHOUT CONTRAST  CT MAXILLOFACIAL WITHOUT CONTRAST  TECHNIQUE: Multidetector CT imaging of the head and maxillofacial structures were performed using the standard protocol without intravenous contrast. Multiplanar CT image reconstructions of the maxillofacial structures were also generated.  COMPARISON:  CT head 07/30/2003  FINDINGS: CT HEAD FINDINGS  Ventricle size is normal. Negative for acute infarct. Negative for hemorrhage or mass. No significant chronic ischemia. No skull abnormality.  CT MAXILLOFACIAL  FINDINGS  Numerous dental caries are present. Numerous areas of periapical lucency are present around teeth. Lucency around tooth 11 with cortical breakthrough and surrounding soft tissue swelling compatible with cellulitis. Lucency around tooth and tooth 21 and 30. Soft tissue abscess not definitely present however no intravenous contrast was given which could limit detection of soft tissue abscess.  Negative for facial fracture. No air-fluid levels in the sinuses. Mild mucosal thickening in the paranasal sinuses.  IMPRESSION: No acute intracranial abnormality.  Negative for facial fracture.  Multiple areas of dental infection. Possible cellulitis related to dental infection left maxilla.   Electronically Signed   By: Marlan Palau M.D.   On: 03/03/2014 17:45  ASSESSMENT / PLAN:  PULMONARY A: Reported hx COPD - no PFT's found in EPIC. P:   - DuoNebs q6hr / Albuterol q3hr PRN.  CARDIOVASCULAR A:  Hx HTN - now hypotensive Hx HLD P:  - NS bolus x 5 total. - Hold home Lipitor, Fenofibrate, Lasix, Lisinopril.  RENAL A:   AKI - likely secondary to hypovolemia / pre-renal. Hyperkalemia P:   - NS bolus x 4 total, then 100cc/hr. - Kayexalate. - BMP in AM.  GASTROINTESTINAL A:   Nutrition GERD P:   - NPO for now. - Pantoprazole.  HEMATOLOGIC A:  Hx Anemia P:  - VTE Proph:  Heparin / SCD's. - CBC in AM.  INFECTIOUS A:   Severe Sepsis - secondary to dental abscess. P:   - Cultures and antibiotics as above, narrow abx as cultures result. - Have called ENT and was informed that Oral Surgery needs to be consulted instead as ENT does not handle dental abscesses. - Rounding team, please consult Oral Surgery in AM.  ENDOCRINE A:   DM Hypothyroidism P:   - CBG's q4hr. - SSI. - IV Synthroid at 1/2 home dose. - Hold home metformin.  NEUROLOGIC A:   Hx epileptic seizures - valproic acid level WNL (70) Acute encephalopathy - per chart review, wife notes some confusion  at baseline. Hx Depression P:   - IV Depakote while NPO. - Hold home Xanax, Celexa, Flexeril, Trazodone for now.   Rutherford Guys, [place] - [facility] Pgr: [phone]  or [phone]  03/03/2014, 8:36 PM  Reviewed above, examined and agree.  53 yo male with severe sepsis with cellulitis from dental source.  Has PCN allergy.  Will need oral surgery evaluation in AM.  CC time 40 minutes.  Coralyn Helling, MD [facility] 03/04/2014, 11:45 PM Pager:  [phone] After 3pm call: [phone]

## 2014-03-03 NOTE — ED Provider Notes (Signed)
CSN: [id number]     Arrival date & time 03/03/14  1452 History   First MD Initiated Contact with Patient 03/03/14 1505     Chief Complaint  Patient presents with  . Dental Problem  . Altered Mental Status     (Consider location/radiation/quality/duration/timing/severity/associated sxs/prior Treatment) Patient is a 53 y.o. male presenting with general illness. The history is provided by the patient and the spouse.  Illness Severity:  Severe Onset quality:  Gradual Timing:  Constant Progression:  Worsening Chronicity: acute on chronic. Associated symptoms: fatigue   Associated symptoms: no abdominal pain, no chest pain, no congestion, no cough, no diarrhea, no ear pain, no fever, no headaches, no nausea, no rash, no rhinorrhea, no shortness of breath and no vomiting     53 yo male pw facial swelling/pain and AMS. Left facial pain and swelling x3 days. Worsening. pcp today. Found to have decreased BP (as low as 70's systolics per report). Started IVF.  Per report, patient with possible increasing sz activity. Patient does not remember having sz's today, but states he usually does not remember them.  No cough. No cp or sob No abd pain, n/v.  No recent head injury.  Medication compliance. No recent decrease sleep. Denies drug or etoh use.  Reports baselin BP around 160's systolic.  Past Medical History  Diagnosis Date  . Seizures   . Hypertension   . Emphysema of lung   . Depression   . Diabetes mellitus without complication   . Thyroid disease   . Hyperlipidemia   . Hiatal hernia   . Anemia   . GERD (gastroesophageal reflux disease)    History reviewed. No pertinent past surgical history. History reviewed. No pertinent family history. History  Substance Use Topics  . Smoking status: Current Every Day Smoker  . Smokeless tobacco: Not on file  . Alcohol Use: No    Review of Systems  Constitutional: Positive for fatigue. Negative for fever and chills.  HENT: Negative  for congestion, ear pain, rhinorrhea, trouble swallowing and voice change.   Eyes: Negative for visual disturbance.  Respiratory: Negative for cough and shortness of breath.   Cardiovascular: Negative for chest pain and leg swelling.  Gastrointestinal: Negative for nausea, vomiting, abdominal pain and diarrhea.  Genitourinary: Negative for flank pain and difficulty urinating.  Musculoskeletal: Negative for back pain.  Skin: Negative for color change and rash.  Neurological: Negative for dizziness and headaches.  All other systems reviewed and are negative.     Allergies  Penicillins  Home Medications   Prior to Admission medications   Medication Sig Start Date End Date Taking? Authorizing Provider  albuterol (PROVENTIL HFA;VENTOLIN HFA) 108 (90 BASE) MCG/ACT inhaler Inhale 2 puffs into the lungs every 4 (four) hours as needed for wheezing or shortness of breath.   Yes Historical Provider, MD  alprazolam Prudy Feeler) 2 MG tablet Take 1 mg by mouth 4 (four) times daily.    Yes Historical Provider, MD  aspirin EC 81 MG tablet Take 81 mg by mouth daily.   Yes Historical Provider, MD  atorvastatin (LIPITOR) 40 MG tablet Take 40 mg by mouth daily.   Yes Historical Provider, MD  Choline Fenofibrate (FENOFIBRIC ACID) 135 MG CPDR Take 1 capsule by mouth daily.   Yes Historical Provider, MD  citalopram (CELEXA) 40 MG tablet Take 40 mg by mouth daily.   Yes Historical Provider, MD  clindamycin (CLEOCIN) 300 MG capsule Take 300 mg by mouth 3 (three) times daily.   Yes  Historical Provider, MD  cyclobenzaprine (FLEXERIL) 10 MG tablet Take 10 mg by mouth 3 (three) times daily as needed for muscle spasms.   Yes Historical Provider, MD  divalproex (DEPAKOTE ER) 500 MG 24 hr tablet Take 500 mg by mouth 2 (two) times daily.   Yes Historical Provider, MD  furosemide (LASIX) 20 MG tablet Take 10-20 mg by mouth daily as needed for fluid.    Yes Historical Provider, MD  ketoconazole (NIZORAL) 2 % cream Apply 1  application topically daily.   Yes Historical Provider, MD  levothyroxine (SYNTHROID, LEVOTHROID) 175 MCG tablet Take 175 mcg by mouth daily.   Yes Historical Provider, MD  lisinopril (PRINIVIL,ZESTRIL) 5 MG tablet Take 5 mg by mouth daily.   Yes Historical Provider, MD  metFORMIN (GLUCOPHAGE) 850 MG tablet Take 850 mg by mouth every morning.   Yes Historical Provider, MD  omeprazole (PRILOSEC) 20 MG capsule Take 20 mg by mouth daily.   Yes Historical Provider, MD  ondansetron (ZOFRAN) 4 MG tablet Take 4 mg by mouth every 8 (eight) hours as needed for nausea or vomiting.   Yes Historical Provider, MD  oxyCODONE-acetaminophen (PERCOCET/ROXICET) 5-325 MG per tablet Take 1 tablet by mouth every 8 (eight) hours as needed for severe pain.   Yes Historical Provider, MD  tiotropium (SPIRIVA) 18 MCG inhalation capsule Place 18 mcg into inhaler and inhale daily.   Yes Historical Provider, MD  traZODone (DESYREL) 150 MG tablet Take 150 mg by mouth at bedtime.   Yes Historical Provider, MD   BP 76/49  Pulse 76  Temp(Src) 98.8 F (37.1 C) (Rectal)  Resp 16  SpO2 94% Physical Exam  Nursing note and vitals reviewed. Constitutional: He is oriented to person, place, and time. He appears well-developed and well-nourished. No distress.  HENT:  Head: Atraumatic.  Nose: No sinus tenderness or nasal deformity.  No foreign bodies.  Mouth/Throat: Uvula is midline. No trismus in the jaw. Abnormal dentition (poor dentition in general. no focal dental pain or evidence of drainable fluid collection).  Mild left facial swelling to left cheek Oropharynx clear Dry mm  Eyes: Conjunctivae are normal. Right eye exhibits no discharge. Left eye exhibits no discharge.  Neck: No tracheal deviation present.  Cardiovascular: Normal heart sounds and intact distal pulses.   Pulmonary/Chest: Effort normal and breath sounds normal. No stridor. No respiratory distress. He has no wheezes. He has no rales.  Abdominal: Soft. He  exhibits no distension. There is no tenderness. There is no guarding.  Musculoskeletal: He exhibits no edema and no tenderness.  Neurological: He is alert and oriented to person, place, and time.  Skin: Skin is warm and dry.  Psychiatric: He has a normal mood and affect. His behavior is normal.    ED Course  Procedures (including critical care time) Labs Review Labs Reviewed  CBC WITH DIFFERENTIAL - Abnormal; Notable for the following:    WBC 10.8 (*)    RBC 3.75 (*)    Hemoglobin 11.5 (*)    HCT 35.6 (*)    Lymphs Abs 4.3 (*)    Monocytes Relative 16 (*)    Monocytes Absolute 1.7 (*)    All other components within normal limits  BASIC METABOLIC PANEL - Abnormal; Notable for the following:    Sodium 136 (*)    Potassium 5.7 (*)    Chloride 95 (*)    Glucose, Bld 106 (*)    BUN 40 (*)    Creatinine, Ser 2.10 (*)  GFR calc non Af Amer 34 (*)    GFR calc Af Amer 39 (*)    All other components within normal limits  URINE RAPID DRUG SCREEN (HOSP PERFORMED) - Abnormal; Notable for the following:    Benzodiazepines POSITIVE (*)    All other components within normal limits  COMPREHENSIVE METABOLIC PANEL - Abnormal; Notable for the following:    Potassium 5.4 (*)    BUN 38 (*)    Creatinine, Ser 1.90 (*)    Calcium 8.3 (*)    Albumin 3.0 (*)    Alkaline Phosphatase 31 (*)    Total Bilirubin <0.2 (*)    GFR calc non Af Amer 38 (*)    GFR calc Af Amer 44 (*)    All other components within normal limits  SALICYLATE LEVEL - Abnormal; Notable for the following:    Salicylate Lvl <2.0 (*)    All other components within normal limits  CBC - Abnormal; Notable for the following:    RBC 3.23 (*)    Hemoglobin 10.1 (*)    HCT 31.2 (*)    Platelets 136 (*)    All other components within normal limits  CREATININE, SERUM - Abnormal; Notable for the following:    Creatinine, Ser 1.68 (*)    GFR calc non Af Amer 44 (*)    GFR calc Af Amer 51 (*)    All other components within normal  limits  CBG MONITORING, ED - Abnormal; Notable for the following:    Glucose-Capillary 112 (*)    All other components within normal limits  CULTURE, BLOOD (ROUTINE X 2)  CULTURE, BLOOD (ROUTINE X 2)  MRSA PCR SCREENING  URINALYSIS, ROUTINE W REFLEX MICROSCOPIC  VALPROIC ACID LEVEL  TSH  ETHANOL  LACTIC ACID, PLASMA  GLUCOSE, CAPILLARY  CBC  BASIC METABOLIC PANEL  MAGNESIUM  PHOSPHORUS  LACTIC ACID, PLASMA  LACTIC ACID, PLASMA  CBG MONITORING, ED  I-STAT CG4 LACTIC ACID, ED    Imaging Review Ct Head Wo Contrast  03/03/2014   CLINICAL DATA:  Seizure.  Dental infection.  EXAM: CT HEAD WITHOUT CONTRAST  CT MAXILLOFACIAL WITHOUT CONTRAST  TECHNIQUE: Multidetector CT imaging of the head and maxillofacial structures were performed using the standard protocol without intravenous contrast. Multiplanar CT image reconstructions of the maxillofacial structures were also generated.  COMPARISON:  CT head 07/30/2003  FINDINGS: CT HEAD FINDINGS  Ventricle size is normal. Negative for acute infarct. Negative for hemorrhage or mass. No significant chronic ischemia. No skull abnormality.  CT MAXILLOFACIAL FINDINGS  Numerous dental caries are present. Numerous areas of periapical lucency are present around teeth. Lucency around tooth 11 with cortical breakthrough and surrounding soft tissue swelling compatible with cellulitis. Lucency around tooth and tooth 21 and 30. Soft tissue abscess not definitely present however no intravenous contrast was given which could limit detection of soft tissue abscess.  Negative for facial fracture. No air-fluid levels in the sinuses. Mild mucosal thickening in the paranasal sinuses.  IMPRESSION: No acute intracranial abnormality.  Negative for facial fracture.  Multiple areas of dental infection. Possible cellulitis related to dental infection left maxilla.   Electronically Signed   By: Marlan Palau M.D.   On: 03/03/2014 17:45   Dg Chest Port 1 View  03/03/2014    CLINICAL DATA:  Hypotension  EXAM: PORTABLE CHEST - 1 VIEW  COMPARISON:  None.  FINDINGS: Hypoventilation with decreased lung volume and bibasilar atelectasis. Vascular congestion may be due to hypoventilation. No edema or effusion.  IMPRESSION: Hypoventilation with low lung volumes and bibasilar atelectasis.   Electronically Signed   By: Marlan Palauharles  Clark M.D.   On: 03/03/2014 16:07   Ct Maxillofacial Wo Cm  03/03/2014   CLINICAL DATA:  Seizure.  Dental infection.  EXAM: CT HEAD WITHOUT CONTRAST  CT MAXILLOFACIAL WITHOUT CONTRAST  TECHNIQUE: Multidetector CT imaging of the head and maxillofacial structures were performed using the standard protocol without intravenous contrast. Multiplanar CT image reconstructions of the maxillofacial structures were also generated.  COMPARISON:  CT head 07/30/2003  FINDINGS: CT HEAD FINDINGS  Ventricle size is normal. Negative for acute infarct. Negative for hemorrhage or mass. No significant chronic ischemia. No skull abnormality.  CT MAXILLOFACIAL FINDINGS  Numerous dental caries are present. Numerous areas of periapical lucency are present around teeth. Lucency around tooth 11 with cortical breakthrough and surrounding soft tissue swelling compatible with cellulitis. Lucency around tooth and tooth 21 and 30. Soft tissue abscess not definitely present however no intravenous contrast was given which could limit detection of soft tissue abscess.  Negative for facial fracture. No air-fluid levels in the sinuses. Mild mucosal thickening in the paranasal sinuses.  IMPRESSION: No acute intracranial abnormality.  Negative for facial fracture.  Multiple areas of dental infection. Possible cellulitis related to dental infection left maxilla.   Electronically Signed   By: Marlan Palauharles  Clark M.D.   On: 03/03/2014 17:45     EKG Interpretation   Date/Time:  Wednesday March 03 2014 15:00:08 EDT Ventricular Rate:  83 PR Interval:  144 QRS Duration: 88 QT Interval:  345 QTC Calculation:  405 R Axis:   20 Text Interpretation:  Sinus rhythm Low voltage, precordial leads  Borderline ST elevation, inferior leads Sinus rhythm No significant change  since last tracing Normal ECG Confirmed by Gerhard MunchLOCKWOOD, ROBERT  MD [phone]([id number]) on  03/03/2014 7:01:09 PM      MDM   Final diagnoses:  Severe sepsis  Dental abscess  Hypotension  Altered mental status   Facial swelling and AMS.  Concern for possible sepsis although afebrile.  Hypotensive. Given NS bolus. Labs as above.  Without evidence of mucor on exam. Also without evidence of PTA, RPA, ludwigs.  Imaging with dental abscess. Given clinda.   discussed with wife. States baseline confusion and generalized weakness. Worse today. Does wax and wane. Uncertain etiology of this. No fevers. No change in meds. Reports taking "lots of goody powder" chronically. Try's to sneak it sometimes as well. Have been trying motrin as well recently. No other drug use per wife. Wife says she is unsure if patient was having sz's today or if he was just confused. No rhythmic movements.   AKI. No prior for comparison.  Salicylate level negative. Rest of labs as above.   Spoke with critical care. Plan to add vanc and cipro on top of clinda 2/2 dental abscess. BP's now 90's systolic after 3L. Patient fatigued, but arousable. Admit to ICU.  8:05 PM Patient easily arousable. BP low 90's systolic.   Labs and imaging reviewed by myself and considered in medical decision making if ordered. Imaging interpreted by radiology.   Discussed case with Dr. Jeraldine LootsLockwood who is in agreement with assessment and plan.   Stevie Kernyan Marvis Bakken, MD 03/03/14 [phone]

## 2014-03-04 ENCOUNTER — Inpatient Hospital Stay (HOSPITAL_COMMUNITY): Payer: Medicare Other

## 2014-03-04 DIAGNOSIS — N179 Acute kidney failure, unspecified: Secondary | ICD-10-CM

## 2014-03-04 DIAGNOSIS — I959 Hypotension, unspecified: Secondary | ICD-10-CM

## 2014-03-04 DIAGNOSIS — R4182 Altered mental status, unspecified: Secondary | ICD-10-CM

## 2014-03-04 DIAGNOSIS — L03211 Cellulitis of face: Secondary | ICD-10-CM | POA: Diagnosis present

## 2014-03-04 LAB — GLUCOSE, CAPILLARY
GLUCOSE-CAPILLARY: 101 mg/dL — AB (ref 70–99)
GLUCOSE-CAPILLARY: 103 mg/dL — AB (ref 70–99)
GLUCOSE-CAPILLARY: 77 mg/dL (ref 70–99)
GLUCOSE-CAPILLARY: 95 mg/dL (ref 70–99)
Glucose-Capillary: 86 mg/dL (ref 70–99)
Glucose-Capillary: 106 mg/dL — ABNORMAL HIGH (ref 70–99)
Glucose-Capillary: 159 mg/dL — ABNORMAL HIGH (ref 70–99)

## 2014-03-04 LAB — BASIC METABOLIC PANEL
BUN: 29 mg/dL — ABNORMAL HIGH (ref 6–23)
CO2: 30 mEq/L (ref 19–32)
Calcium: 8.4 mg/dL (ref 8.4–10.5)
Chloride: 102 mEq/L (ref 96–112)
Creatinine, Ser: 1.51 mg/dL — ABNORMAL HIGH (ref 0.50–1.35)
GFR, EST AFRICAN AMERICAN: 58 mL/min — AB (ref >90)
GFR, EST NON AFRICAN AMERICAN: 50 mL/min — AB (ref >90)
Glucose, Bld: 91 mg/dL (ref 70–99)
POTASSIUM: 5.7 meq/L — AB (ref 3.7–5.3)
SODIUM: 141 meq/L (ref 137–147)

## 2014-03-04 LAB — CBC
HCT: 32.3 % — ABNORMAL LOW (ref 39.0–52.0)
Hemoglobin: 10.2 g/dL — ABNORMAL LOW (ref 13.0–17.0)
MCH: 30.8 pg (ref 26.0–34.0)
MCHC: 31.6 g/dL (ref 30.0–36.0)
MCV: 97.6 fL (ref 78.0–100.0)
Platelets: 131 10*3/uL — ABNORMAL LOW (ref 150–400)
RBC: 3.31 MIL/uL — ABNORMAL LOW (ref 4.22–5.81)
RDW: 15.1 % (ref 11.5–15.5)
WBC: 7.6 10*3/uL (ref 4.0–10.5)

## 2014-03-04 LAB — LACTIC ACID, PLASMA
Lactic Acid, Venous: 0.8 mmol/L (ref 0.5–2.2)
Lactic Acid, Venous: 0.7 mmol/L (ref 0.5–2.2)

## 2014-03-04 LAB — MRSA PCR SCREENING: MRSA by PCR: NEGATIVE

## 2014-03-04 LAB — MAGNESIUM: Magnesium: 1.8 mg/dL (ref 1.5–2.5)

## 2014-03-04 LAB — PHOSPHORUS: PHOSPHORUS: 2.4 mg/dL (ref 2.3–4.6)

## 2014-03-04 MED ORDER — ALPRAZOLAM 0.25 MG PO TABS
0.2500 mg | ORAL_TABLET | Freq: Three times a day (TID) | ORAL | Status: DC
  Administered 2014-03-05: 0.25 mg via ORAL
  Filled 2014-03-04 (×2): qty 1

## 2014-03-04 MED ORDER — FENTANYL CITRATE 0.05 MG/ML IJ SOLN
25.0000 ug | INTRAMUSCULAR | Status: DC | PRN
  Administered 2014-03-04 – 2014-03-05 (×2): 50 ug via INTRAVENOUS
  Filled 2014-03-04 (×2): qty 2

## 2014-03-04 MED ORDER — ALBUTEROL SULFATE (2.5 MG/3ML) 0.083% IN NEBU
2.5000 mg | INHALATION_SOLUTION | RESPIRATORY_TRACT | Status: DC | PRN
Start: 2014-03-04 — End: 2014-03-05

## 2014-03-04 MED ORDER — FUROSEMIDE 10 MG/ML IJ SOLN
40.0000 mg | Freq: Once | INTRAMUSCULAR | Status: AC
  Administered 2014-03-04: 40 mg via INTRAVENOUS
  Filled 2014-03-04: qty 4

## 2014-03-04 MED ORDER — TRAZODONE HCL 150 MG PO TABS
150.0000 mg | ORAL_TABLET | Freq: Every day | ORAL | Status: DC
  Administered 2014-03-05: 150 mg via ORAL
  Filled 2014-03-04 (×2): qty 1

## 2014-03-04 MED ORDER — LORAZEPAM 2 MG/ML IJ SOLN: 1.0000 mg | INTRAMUSCULAR | Status: DC | PRN

## 2014-03-04 MED ORDER — ALPRAZOLAM 0.25 MG PO TABS: 0.2500 mg | ORAL_TABLET | Freq: Four times a day (QID) | ORAL | Status: DC

## 2014-03-04 MED ORDER — SODIUM POLYSTYRENE SULFONATE 15 GM/60ML PO SUSP
15.0000 g | Freq: Once | ORAL | Status: AC
  Administered 2014-03-04: 15 g via ORAL
  Filled 2014-03-04: qty 60

## 2014-03-04 MED ORDER — TIOTROPIUM BROMIDE MONOHYDRATE 18 MCG IN CAPS
18.0000 ug | ORAL_CAPSULE | Freq: Every day | RESPIRATORY_TRACT | Status: DC
  Administered 2014-03-05: 18 ug via RESPIRATORY_TRACT
  Filled 2014-03-04: qty 5

## 2014-03-04 NOTE — Consult Note (Addendum)
Reason for Consult: Left Mandibular Abscess Referring Physician: Dr. Purcell Mouton [name] is an 53 y.o. male.   HPI: [name] is 53 y.o. M brought to ED with left mandible swelling and mouth/tooth pain for three days. In the ED [name] was found to have severe sepsis secondary to an odontogenic infection (on CT scan).  PMHx:  Past Medical History  Diagnosis Date  . Seizures   . Hypertension   . Emphysema of lung   . Depression   . Diabetes mellitus without complication   . Thyroid disease   . Hyperlipidemia   . Hiatal hernia   . Anemia   . GERD (gastroesophageal reflux disease)     PSx: History reviewed. No pertinent past surgical history.  Family Hx: History reviewed. No pertinent family history.  Social Hx:  reports that he has been smoking.  He does not have any smokeless tobacco history on file. He reports that he does not drink alcohol or use illicit drugs.Currently uses an "e-cigarette."   Allergies:  Allergies  Allergen Reactions  . Penicillins Other (See Comments)    Reaction unknown    Medications: I have reviewed the patient's current medications.  Labs:  Results for orders placed during the hospital encounter of 03/03/14 (from the past 48 hour(s))  CBC WITH DIFFERENTIAL     Status: Abnormal   Collection Time    03/03/14  3:00 PM      Result Value Ref Range   WBC 10.8 (*) 4.0 - 10.5 K/uL   RBC 3.75 (*) 4.22 - 5.81 MIL/uL   Hemoglobin 11.5 (*) 13.0 - 17.0 g/dL   HCT 35.6 (*) 39.0 - 52.0 %   MCV 94.9  78.0 - 100.0 fL   MCH 30.7  26.0 - 34.0 pg   MCHC 32.3  30.0 - 36.0 g/dL   RDW 14.6  11.5 - 15.5 %   Platelets 160  150 - 400 K/uL   Neutrophils Relative % 44  43 - 77 %   Neutro Abs 4.7  1.7 - 7.7 K/uL   Lymphocytes Relative 40  12 - 46 %   Lymphs Abs 4.3 (*) 0.7 - 4.0 K/uL   Monocytes Relative 16 (*) 3 - 12 %   Monocytes Absolute 1.7 (*) 0.1 - 1.0 K/uL   Eosinophils Relative 0  0 - 5 %   Eosinophils Absolute 0.0  0.0 - 0.7 K/uL   Basophils  Relative 0  0 - 1 %   Basophils Absolute 0.0  0.0 - 0.1 K/uL  BASIC METABOLIC PANEL     Status: Abnormal   Collection Time    03/03/14  3:00 PM      Result Value Ref Range   Sodium 136 (*) 137 - 147 mEq/L   Potassium 5.7 (*) 3.7 - 5.3 mEq/L   Comment: HEMOLYSIS AT THIS LEVEL MAY AFFECT RESULT   Chloride 95 (*) 96 - 112 mEq/L   CO2 27  19 - 32 mEq/L   Glucose, Bld 106 (*) 70 - 99 mg/dL   BUN 40 (*) 6 - 23 mg/dL   Creatinine, Ser 2.10 (*) 0.50 - 1.35 mg/dL   Calcium 9.4  8.4 - 10.5 mg/dL   GFR calc non Af Amer 34 (*) >90 mL/min   GFR calc Af Amer 39 (*) >90 mL/min   Comment: (NOTE)     The eGFR has been calculated using the CKD EPI equation.     This calculation has not been validated in all  clinical situations.     eGFR's persistently <90 mL/min signify possible Chronic Kidney     Disease.  VALPROIC ACID LEVEL     Status: None   Collection Time    03/03/14  3:00 PM      Result Value Ref Range   Valproic Acid Lvl 70.9  50.0 - 100.0 ug/mL  CULTURE, BLOOD (ROUTINE X 2)     Status: None   Collection Time    03/03/14  3:01 PM      Result Value Ref Range   Specimen Description BLOOD HAND LEFT     Special Requests BOTTLES DRAWN AEROBIC AND ANAEROBIC 10CC     Culture  Setup Time       Value: 03/03/2014 22:28     Performed at [facility]   Culture       Value:        BLOOD CULTURE RECEIVED NO GROWTH TO DATE CULTURE WILL BE HELD FOR 5 DAYS BEFORE ISSUING A FINAL NEGATIVE REPORT     Performed at [facility]   Report Status PENDING    CBG MONITORING, ED     Status: Abnormal   Collection Time    03/03/14  3:09 PM      Result Value Ref Range   Glucose-Capillary 112 (*) 70 - 99 mg/dL  I-STAT CG4 LACTIC ACID, ED     Status: None   Collection Time    03/03/14  3:53 PM      Result Value Ref Range   Lactic Acid, Venous 1.62  0.5 - 2.2 mmol/L  CBG MONITORING, ED     Status: None   Collection Time    03/03/14  3:54 PM      Result Value Ref Range   Glucose-Capillary  92  70 - 99 mg/dL  TSH     Status: None   Collection Time    03/03/14  5:33 PM      Result Value Ref Range   TSH 0.372  0.350 - 4.500 uIU/mL  ETHANOL     Status: None   Collection Time    03/03/14  5:33 PM      Result Value Ref Range   Alcohol, Ethyl (B) <11  0 - 11 mg/dL   Comment:            LOWEST DETECTABLE LIMIT FOR     SERUM ALCOHOL IS 11 mg/dL     FOR MEDICAL PURPOSES ONLY  COMPREHENSIVE METABOLIC PANEL     Status: Abnormal   Collection Time    03/03/14  5:33 PM      Result Value Ref Range   Sodium 139  137 - 147 mEq/L   Potassium 5.4 (*) 3.7 - 5.3 mEq/L   Chloride 103  96 - 112 mEq/L   Comment: DELTA CHECK NOTED   CO2 27  19 - 32 mEq/L   Glucose, Bld 92  70 - 99 mg/dL   BUN 38 (*) 6 - 23 mg/dL   Creatinine, Ser 1.90 (*) 0.50 - 1.35 mg/dL   Calcium 8.3 (*) 8.4 - 10.5 mg/dL   Total Protein 6.2  6.0 - 8.3 g/dL   Albumin 3.0 (*) 3.5 - 5.2 g/dL   AST 12  0 - 37 U/L   ALT 8  0 - 53 U/L   Alkaline Phosphatase 31 (*) 39 - 117 U/L   Total Bilirubin <0.2 (*) 0.3 - 1.2 mg/dL   GFR calc non Af Amer 38 (*) >90  mL/min   GFR calc Af Amer 44 (*) >90 mL/min   Comment: (NOTE)     The eGFR has been calculated using the CKD EPI equation.     This calculation has not been validated in all clinical situations.     eGFR's persistently <90 mL/min signify possible Chronic Kidney     Disease.  SALICYLATE LEVEL     Status: Abnormal   Collection Time    03/03/14  5:33 PM      Result Value Ref Range   Salicylate Lvl <8.9 (*) 2.8 - 20.0 mg/dL  URINALYSIS, ROUTINE W REFLEX MICROSCOPIC     Status: None   Collection Time    03/03/14  5:50 PM      Result Value Ref Range   Color, Urine YELLOW  YELLOW   APPearance CLEAR  CLEAR   Specific Gravity, Urine 1.014  1.005 - 1.030   pH 5.0  5.0 - 8.0   Glucose, UA NEGATIVE  NEGATIVE mg/dL   Hgb urine dipstick NEGATIVE  NEGATIVE   Bilirubin Urine NEGATIVE  NEGATIVE   Ketones, ur NEGATIVE  NEGATIVE mg/dL   Protein, ur NEGATIVE  NEGATIVE mg/dL    Urobilinogen, UA 0.2  0.0 - 1.0 mg/dL   Nitrite NEGATIVE  NEGATIVE   Leukocytes, UA NEGATIVE  NEGATIVE   Comment: MICROSCOPIC NOT DONE ON URINES WITH NEGATIVE PROTEIN, BLOOD, LEUKOCYTES, NITRITE, OR GLUCOSE <1000 mg/dL.  URINE RAPID DRUG SCREEN (HOSP PERFORMED)     Status: Abnormal   Collection Time    03/03/14  5:50 PM      Result Value Ref Range   Opiates NONE DETECTED  NONE DETECTED   Cocaine NONE DETECTED  NONE DETECTED   Benzodiazepines POSITIVE (*) NONE DETECTED   Amphetamines NONE DETECTED  NONE DETECTED   Tetrahydrocannabinol NONE DETECTED  NONE DETECTED   Barbiturates NONE DETECTED  NONE DETECTED   Comment:            DRUG SCREEN FOR MEDICAL PURPOSES     ONLY.  IF CONFIRMATION IS NEEDED     FOR ANY PURPOSE, NOTIFY LAB     WITHIN 5 DAYS.                LOWEST DETECTABLE LIMITS     FOR URINE DRUG SCREEN     Drug Class       Cutoff (ng/mL)     Amphetamine      1000     Barbiturate      200     Benzodiazepine   381     Tricyclics       017     Opiates          300     Cocaine          300     THC              50  CBC     Status: Abnormal   Collection Time    03/03/14  8:54 PM      Result Value Ref Range   WBC 8.0  4.0 - 10.5 K/uL   RBC 3.23 (*) 4.22 - 5.81 MIL/uL   Hemoglobin 10.1 (*) 13.0 - 17.0 g/dL   HCT 31.2 (*) 39.0 - 52.0 %   MCV 96.6  78.0 - 100.0 fL   MCH 31.3  26.0 - 34.0 pg   MCHC 32.4  30.0 - 36.0 g/dL   RDW 14.9  11.5 - 15.5 %  Platelets 136 (*) 150 - 400 K/uL  CREATININE, SERUM     Status: Abnormal   Collection Time    03/03/14  8:54 PM      Result Value Ref Range   Creatinine, Ser 1.68 (*) 0.50 - 1.35 mg/dL   GFR calc non Af Amer 44 (*) >90 mL/min   GFR calc Af Amer 51 (*) >90 mL/min   Comment: (NOTE)     The eGFR has been calculated using the CKD EPI equation.     This calculation has not been validated in all clinical situations.     eGFR's persistently <90 mL/min signify possible Chronic Kidney     Disease.  GLUCOSE, CAPILLARY     Status:  None   Collection Time    03/03/14  9:28 PM      Result Value Ref Range   Glucose-Capillary 89  70 - 99 mg/dL  MRSA PCR SCREENING     Status: None   Collection Time    03/03/14  9:34 PM      Result Value Ref Range   MRSA by PCR NEGATIVE  NEGATIVE   Comment:            The GeneXpert MRSA Assay (FDA     approved for NASAL specimens     only), is one component of a     comprehensive MRSA colonization     surveillance program. It is not     intended to diagnose MRSA     infection nor to guide or     monitor treatment for     MRSA infections.  LACTIC ACID, PLASMA     Status: None   Collection Time    03/03/14 10:21 PM      Result Value Ref Range   Lactic Acid, Venous 0.6  0.5 - 2.2 mmol/L  GLUCOSE, CAPILLARY     Status: Abnormal   Collection Time    03/03/14 11:59 PM      Result Value Ref Range   Glucose-Capillary 103 (*) 70 - 99 mg/dL   Comment 1 Documented in Chart     Comment 2 Notify RN    CBC     Status: Abnormal   Collection Time    03/04/14  2:09 AM      Result Value Ref Range   WBC 7.6  4.0 - 10.5 K/uL   RBC 3.31 (*) 4.22 - 5.81 MIL/uL   Hemoglobin 10.2 (*) 13.0 - 17.0 g/dL   HCT 32.3 (*) 39.0 - 52.0 %   MCV 97.6  78.0 - 100.0 fL   MCH 30.8  26.0 - 34.0 pg   MCHC 31.6  30.0 - 36.0 g/dL   RDW 15.1  11.5 - 15.5 %   Platelets 131 (*) 150 - 400 K/uL  BASIC METABOLIC PANEL     Status: Abnormal   Collection Time    03/04/14  2:09 AM      Result Value Ref Range   Sodium 141  137 - 147 mEq/L   Potassium 5.7 (*) 3.7 - 5.3 mEq/L   Chloride 102  96 - 112 mEq/L   CO2 30  19 - 32 mEq/L   Glucose, Bld 91  70 - 99 mg/dL   BUN 29 (*) 6 - 23 mg/dL   Creatinine, Ser 1.51 (*) 0.50 - 1.35 mg/dL   Calcium 8.4  8.4 - 10.5 mg/dL   GFR calc non Af Amer 50 (*) >90 mL/min   GFR calc Af Wyvonnia Lora  58 (*) >90 mL/min   Comment: (NOTE)     The eGFR has been calculated using the CKD EPI equation.     This calculation has not been validated in all clinical situations.     eGFR's  persistently <90 mL/min signify possible Chronic Kidney     Disease.  MAGNESIUM     Status: None   Collection Time    03/04/14  2:09 AM      Result Value Ref Range   Magnesium 1.8  1.5 - 2.5 mg/dL  PHOSPHORUS     Status: None   Collection Time    03/04/14  2:09 AM      Result Value Ref Range   Phosphorus 2.4  2.3 - 4.6 mg/dL  LACTIC ACID, PLASMA     Status: None   Collection Time    03/04/14  2:10 AM      Result Value Ref Range   Lactic Acid, Venous 0.8  0.5 - 2.2 mmol/L  GLUCOSE, CAPILLARY     Status: None   Collection Time    03/04/14  4:04 AM      Result Value Ref Range   Glucose-Capillary 95  70 - 99 mg/dL  GLUCOSE, CAPILLARY     Status: None   Collection Time    03/04/14  8:04 AM      Result Value Ref Range   Glucose-Capillary 77  70 - 99 mg/dL  LACTIC ACID, PLASMA     Status: None   Collection Time    03/04/14  8:44 AM      Result Value Ref Range   Lactic Acid, Venous 0.7  0.5 - 2.2 mmol/L  GLUCOSE, CAPILLARY     Status: None   Collection Time    03/04/14 11:33 AM      Result Value Ref Range   Glucose-Capillary 86  70 - 99 mg/dL  GLUCOSE, CAPILLARY     Status: Abnormal   Collection Time    03/04/14  3:32 PM      Result Value Ref Range   Glucose-Capillary 101 (*) 70 - 99 mg/dL    Radiology: Ct Head Wo Contrast  03/03/2014   CLINICAL DATA:  Seizure.  Dental infection.  EXAM: CT HEAD WITHOUT CONTRAST  CT MAXILLOFACIAL WITHOUT CONTRAST  TECHNIQUE: Multidetector CT imaging of the head and maxillofacial structures were performed using the standard protocol without intravenous contrast. Multiplanar CT image reconstructions of the maxillofacial structures were also generated.  COMPARISON:  CT head 07/30/2003  FINDINGS: CT HEAD FINDINGS  Ventricle size is normal. Negative for acute infarct. Negative for hemorrhage or mass. No significant chronic ischemia. No skull abnormality.  CT MAXILLOFACIAL FINDINGS  Numerous dental caries are present. Numerous areas of periapical  lucency are present around teeth. Lucency around tooth 11 with cortical breakthrough and surrounding soft tissue swelling compatible with cellulitis. Lucency around tooth and tooth 21 and 30. Soft tissue abscess not definitely present however no intravenous contrast was given which could limit detection of soft tissue abscess.  Negative for facial fracture. No air-fluid levels in the sinuses. Mild mucosal thickening in the paranasal sinuses.  IMPRESSION: No acute intracranial abnormality.  Negative for facial fracture.  Multiple areas of dental infection. Possible cellulitis related to dental infection left maxilla.   Electronically Signed   By: Franchot Gallo M.D.   On: 03/03/2014 17:45   Dg Chest Port 1 View  03/03/2014   CLINICAL DATA:  Hypotension  EXAM: PORTABLE CHEST - 1 VIEW  COMPARISON:  None.  FINDINGS: Hypoventilation with decreased lung volume and bibasilar atelectasis. Vascular congestion may be due to hypoventilation. No edema or effusion.  IMPRESSION: Hypoventilation with low lung volumes and bibasilar atelectasis.   Electronically Signed   By: Franchot Gallo M.D.   On: 03/03/2014 16:07   Ct Maxillofacial Wo Cm  03/03/2014   CLINICAL DATA:  Seizure.  Dental infection.  EXAM: CT HEAD WITHOUT CONTRAST  CT MAXILLOFACIAL WITHOUT CONTRAST  TECHNIQUE: Multidetector CT imaging of the head and maxillofacial structures were performed using the standard protocol without intravenous contrast. Multiplanar CT image reconstructions of the maxillofacial structures were also generated.  COMPARISON:  CT head 07/30/2003  FINDINGS: CT HEAD FINDINGS  Ventricle size is normal. Negative for acute infarct. Negative for hemorrhage or mass. No significant chronic ischemia. No skull abnormality.  CT MAXILLOFACIAL FINDINGS  Numerous dental caries are present. Numerous areas of periapical lucency are present around teeth. Lucency around tooth 11 with cortical breakthrough and surrounding soft tissue swelling compatible with  cellulitis. Lucency around tooth and tooth 21 and 30. Soft tissue abscess not definitely present however no intravenous contrast was given which could limit detection of soft tissue abscess.  Negative for facial fracture. No air-fluid levels in the sinuses. Mild mucosal thickening in the paranasal sinuses.  IMPRESSION: No acute intracranial abnormality.  Negative for facial fracture.  Multiple areas of dental infection. Possible cellulitis related to dental infection left maxilla.   Electronically Signed   By: Franchot Gallo M.D.   On: 03/03/2014 17:45    JSH:[id number] items are noted in HPI.  Vital Signs: BP 126/62  Pulse 77  Temp(Src) 98.6 F (37 C) (Oral)  Resp 11  Ht '5\' 7"'  (1.702 m)  Wt 107 kg (235 lb 14.3 oz)  BMI 36.94 kg/m2  SpO2 97%  Physical Exam: The patient has mild facial swelling along the left mandible.  The patient explains that the swelling has gone down significantly with antibiotics. His cranial nerves are grossly intact.  Intra-orally he has rampant tooth decay.  He has gingival swelling extending from #19 to #22 along the vestibule.  The floor of the mouth is soft and non-raised and he has no dysphonia or dysphasia.    Assessment/Plan: [name] has rampant caries and an odontogenic abscess of the left mandible.   1. I recommend panorex dental radiograph 2. I also recommend the patient continue on clindamycin for at least 7 days.   3. Given that he has responded so well to medical treatment and is swelling has improved.  I would recommend that we the patient be discharged when he is stable.  We will then schedule him in our office for extraction of remaining teeth as all of this teeth are non-salvageable.   4. Please coordinate with our office at [phone].   5. The patient may eat a diet as tolerated.  [place],Tyrion Glaude L  03/04/2014, 8:06 PM     2

## 2014-03-04 NOTE — Progress Notes (Signed)
Inpatient Diabetes Program Recommendations  AACE/ADA: New Consensus Statement on Inpatient Glycemic Control (2013)  Target Ranges:  Prepandial:   less than 140 mg/dL      Peak postprandial:   less than 180 mg/dL (1-2 hours)      Critically ill patients:  140 - 180 mg/dL   Reason for Visit:  Referral received.    Diabetes history: Type 2 diabetes Outpatient Diabetes medications: Metformin 850 mg daily Current orders for Inpatient glycemic control:  Novolog resistant q 4 hours  Spoke to patient regarding home monitoring supplies.  He states that he has a meter but cannot find any strips that will go with the meter.  It has been a while since he received the meter in the mail.   MD at discharge, patient will need Rx. For meter,strips, and lancets.   Will also order patient "Living Well with Diabetes" booklet for he and his wife to review.    Thanks,  Beryl Meager, RN, BC-ADM Inpatient Diabetes Coordinator Pager [phone]

## 2014-03-04 NOTE — Progress Notes (Signed)
UR Completed.  Jaymar Loeber Jane [phone] 03/04/2014  

## 2014-03-04 NOTE — Progress Notes (Signed)
PULMONARY / CRITICAL CARE MEDICINE   Name: [name] MRN: [id number] DOB: 05-17-1961    ADMISSION DATE:  03/03/2014 CONSULTATION DATE:  03/03/14  REFERRING MD :  EDP PRIMARY SERVICE: PCCM  CHIEF COMPLAINT:  Sepsis related to dental abscess  BRIEF PATIENT DESCRIPTION: 53 y.o. M brought to ED with left mandible swelling and mouth/tooth pain x 3 days. In ED pt found to have severe sepsis secondary to dental infection that was revealed on CT. PCCM was consulted for admission.   SIGNIFICANT EVENTS:  6/10 maxillofacial CT >>> Multiple areas of dental infection. Possible cellulitis related to dental infection left maxilla  STUDIES:  CT head/CT maxillofacial 6/10: No acute intracranial abnormality. Negative for facial fracture.  Multiple areas of dental infection. Possible cellulitis related to dental infection left maxilla.  CXR 6/10: Hypoventilation with low lung volumes and bibasilar atelectasis vs pulm edema   LINES / TUBES: none  CULTURES: Blood 6/10 >>>   ANTIBIOTICS: Cipro 6/10 >>>  Vancomycin 6/10 >>>  Clindamycin 6/10 >>>   SUBJECTIVE:  Feeling better this morning  VITAL SIGNS: Temp:  [97.7 F (36.5 C)-98.9 F (37.2 C)] 98.8 F (37.1 C) (06/11 0831) Pulse Rate:  [66-86] 84 (06/11 0900) Resp:  [8-24] 15 (06/11 0900) BP: (67-131)/(41-96) 95/74 mmHg (06/11 0900) SpO2:  [89 %-100 %] 98 % (06/11 0900) Weight:  [101.606 kg (224 lb)-107 kg (235 lb 14.3 oz)] 107 kg (235 lb 14.3 oz) (06/11 0500) HEMODYNAMICS:      INTAKE / OUTPUT: Intake/Output     06/10 0701 - 06/11 0700 06/11 0701 - 06/12 0700   I.V. (mL/kg) 4200 (39.3) 100 (0.9)   IV Piggyback 305 200   Total Intake(mL/kg) 4505 (42.1) 300 (2.8)   Urine (mL/kg/hr) 900 325 (0.7)   Total Output 900 325   Net +3605 -25          PHYSICAL EXAMINATION: General:  Chronically ill appearing male Neuro: A/ox3. Tremors HEENT: [name]  Cardiovascular: RRR Lungs: diminished Abdomen: soft, non  distended Musculoskeletal: no deformity Skin: left sided facial swelling and redness, painful to palpation  LABS:  CBC  Recent Labs Lab 03/03/14 1500 03/03/14 2054 03/04/14 0209  WBC 10.8* 8.0 7.6  HGB 11.5* 10.1* 10.2*  HCT 35.6* 31.2* 32.3*  PLT 160 136* 131*   Coag's No results found for this basename: APTT, INR,  in the last 168 hours BMET  Recent Labs Lab 03/03/14 1500 03/03/14 1733 03/03/14 2054 03/04/14 0209  NA 136* 139  --  141  K 5.7* 5.4*  --  5.7*  CL 95* 103  --  102  CO2 27 27  --  30  BUN 40* 38*  --  29*  CREATININE 2.10* 1.90* 1.68* 1.51*  GLUCOSE 106* 92  --  91   Electrolytes  Recent Labs Lab 03/03/14 1500 03/03/14 1733 03/04/14 0209  CALCIUM 9.4 8.3* 8.4  MG  --   --  1.8  PHOS  --   --  2.4   Sepsis Markers  Recent Labs Lab 03/03/14 2221 03/04/14 0210 03/04/14 0844  LATICACIDVEN 0.6 0.8 0.7   ABG No results found for this basename: PHART, PCO2ART, PO2ART,  in the last 168 hours Liver Enzymes  Recent Labs Lab 03/03/14 1733  AST 12  ALT 8  ALKPHOS 31*  BILITOT <0.2*  ALBUMIN 3.0*   Cardiac Enzymes No results found for this basename: TROPONINI, PROBNP,  in the last 168 hours Glucose  Recent Labs Lab 03/03/14 1509 03/03/14 1554 03/03/14 2128  03/03/14 2359 03/04/14 0404 03/04/14 0804  GLUCAP 112* 92 89 103* 95 77    ASSESSMENT / PLAN:  PULMONARY  A:  Pulmonary Edema - related to fluid overload Reported hx COPD - no PFT's found in EPIC.  P:  F/u CXR Lasix x1 today DuoNebs q6hr / Albuterol q3hr PRN.  Restart home spiriva IS/FV  CARDIOVASCULAR  A:  Hypotension> resolved Hx HLD  P:   Given NS bolus x 5 total Hold home Lipitor, Fenofibrate, Lasix, Lisinopril.   RENAL  A:  AKI - likely secondary to hypovolemia / pre-renal - Scr improving Hyperkalemia - persistent P:  Order calcium gluconate 1 gram IV Dextrose + Insulin Order Kayexalate kvo fluids BMP in AM.   GASTROINTESTINAL  A:   Nutrition  GERD  P:  NPO until oral surgery consult Pantoprazole.   HEMATOLOGIC  A:  Hx Anemia - CBC stable P:  VTE Proph: Heparin / SCD's.  CBC in AM.   INFECTIOUS  A:  Severe Sepsis - secondary to dental abscess.  P:  Cultures and antibiotics as above, narrow abx as cultures result.  C/s Oral Surgery   ENDOCRINE  A:  DM  Hypothyroidism  P:  CBG's q4hr.  SSI.  IV Synthroid at 1/2 home dose.  Hold home metformin.   NEUROLOGIC  A:  Hx epileptic seizures - valproic acid level WNL (70)  Acute encephalopathy - per chart review, wife notes some confusion at baseline after possible silent seizures Hx Depression  P:  IV Depakote while NPO.  Hold home Xanax, Celexa, Flexeril, Trazodone for now.   TODAY'S SUMMARY:  Hypotension resolved with fluid resuscitation. Consult oral surgery today  Transfer to floor Incentive spirometry Lasix x1 Consult oral surgery  To TR[facility], PCCM off  Heber CarolinaBrent Yoav Okane, MD [place] [facility] Pager: [phone] Cell: [phone] If no response, call [phone]   03/04/2014, 11:13 AM

## 2014-03-05 ENCOUNTER — Inpatient Hospital Stay (HOSPITAL_COMMUNITY): Payer: Medicare Other

## 2014-03-05 DIAGNOSIS — G9340 Encephalopathy, unspecified: Secondary | ICD-10-CM | POA: Diagnosis present

## 2014-03-05 DIAGNOSIS — E119 Type 2 diabetes mellitus without complications: Secondary | ICD-10-CM | POA: Diagnosis present

## 2014-03-05 DIAGNOSIS — E039 Hypothyroidism, unspecified: Secondary | ICD-10-CM | POA: Diagnosis present

## 2014-03-05 DIAGNOSIS — N179 Acute kidney failure, unspecified: Secondary | ICD-10-CM | POA: Diagnosis present

## 2014-03-05 DIAGNOSIS — E785 Hyperlipidemia, unspecified: Secondary | ICD-10-CM | POA: Diagnosis present

## 2014-03-05 DIAGNOSIS — G40909 Epilepsy, unspecified, not intractable, without status epilepticus: Secondary | ICD-10-CM | POA: Diagnosis present

## 2014-03-05 DIAGNOSIS — K089 Disorder of teeth and supporting structures, unspecified: Secondary | ICD-10-CM | POA: Diagnosis present

## 2014-03-05 DIAGNOSIS — I10 Essential (primary) hypertension: Secondary | ICD-10-CM | POA: Diagnosis present

## 2014-03-05 DIAGNOSIS — F411 Generalized anxiety disorder: Secondary | ICD-10-CM | POA: Diagnosis present

## 2014-03-05 DIAGNOSIS — F32A Depression, unspecified: Secondary | ICD-10-CM | POA: Diagnosis present

## 2014-03-05 DIAGNOSIS — F329 Major depressive disorder, single episode, unspecified: Secondary | ICD-10-CM | POA: Diagnosis present

## 2014-03-05 LAB — BASIC METABOLIC PANEL
BUN: 22 mg/dL (ref 6–23)
CHLORIDE: 103 meq/L (ref 96–112)
CO2: 26 meq/L (ref 19–32)
CREATININE: 1.11 mg/dL (ref 0.50–1.35)
Calcium: 8.8 mg/dL (ref 8.4–10.5)
GFR calc non Af Amer: 73 mL/min — ABNORMAL LOW (ref >90)
GFR, EST AFRICAN AMERICAN: 85 mL/min — AB (ref >90)
Glucose, Bld: 95 mg/dL (ref 70–99)
POTASSIUM: 4.3 meq/L (ref 3.7–5.3)
Sodium: 140 mEq/L (ref 137–147)

## 2014-03-05 LAB — CBC
HCT: 32 % — ABNORMAL LOW (ref 39.0–52.0)
HEMOGLOBIN: 10.6 g/dL — AB (ref 13.0–17.0)
MCH: 30.9 pg (ref 26.0–34.0)
MCHC: 33.1 g/dL (ref 30.0–36.0)
MCV: 93.3 fL (ref 78.0–100.0)
Platelets: 172 10*3/uL (ref 150–400)
RBC: 3.43 MIL/uL — AB (ref 4.22–5.81)
RDW: 14.3 % (ref 11.5–15.5)
WBC: 7.9 10*3/uL (ref 4.0–10.5)

## 2014-03-05 LAB — GLUCOSE, CAPILLARY
GLUCOSE-CAPILLARY: 107 mg/dL — AB (ref 70–99)
GLUCOSE-CAPILLARY: 129 mg/dL — AB (ref 70–99)
GLUCOSE-CAPILLARY: 142 mg/dL — AB (ref 70–99)
Glucose-Capillary: 100 mg/dL — ABNORMAL HIGH (ref 70–99)

## 2014-03-05 MED ORDER — TRAMADOL HCL 50 MG PO TABS
100.0000 mg | ORAL_TABLET | ORAL | Status: AC
  Administered 2014-03-05: 100 mg via ORAL
  Filled 2014-03-05: qty 2

## 2014-03-05 MED ORDER — ALPRAZOLAM 0.5 MG PO TABS
0.5000 mg | ORAL_TABLET | Freq: Three times a day (TID) | ORAL | Status: DC
  Administered 2014-03-05 (×2): 0.5 mg via ORAL
  Filled 2014-03-05 (×2): qty 1

## 2014-03-05 MED ORDER — IBUPROFEN 400 MG PO TABS
400.0000 mg | ORAL_TABLET | ORAL | Status: AC
Start: 2014-03-05 — End: 2014-03-05
  Administered 2014-03-05: 400 mg via ORAL
  Filled 2014-03-05 (×2): qty 1

## 2014-03-05 MED ORDER — LIVING WELL WITH DIABETES BOOK
Freq: Once | Status: AC
  Administered 2014-03-05: 14:00:00
  Filled 2014-03-05 (×2): qty 1

## 2014-03-05 MED ORDER — CLINDAMYCIN HCL 300 MG PO CAPS: 300.0000 mg | ORAL_CAPSULE | Freq: Four times a day (QID) | ORAL | Status: DC

## 2014-03-05 MED ORDER — ACETAMINOPHEN 500 MG PO TABS
500.0000 mg | ORAL_TABLET | ORAL | Status: AC
  Administered 2014-03-05: 500 mg via ORAL
  Filled 2014-03-05: qty 1

## 2014-03-05 MED ORDER — OXYCODONE HCL 5 MG PO TABS: 5.0000 mg | ORAL_TABLET | ORAL | Status: DC | PRN

## 2014-03-05 NOTE — Discharge Summary (Signed)
Physician Discharge Summary  [name] OKH:[id number] DOB: 18-Nov-1960 DOA: 03/03/2014  PCP: Hayden Rasmussen., MD  Admit date: 03/03/2014 Discharge date: 03/05/2014  Time spent: 35 minutes  Recommendations for Outpatient Follow-up:  1. Patient will follow up with his PCP in the next month 2. He'll follow up with Dr. Buelah Manis, dentistry in the next 2 weeks for further tooth extraction 3. New medication: OxyIR 5 mg every 4 hours when necessary pain 4. New medication: Clindamycin 300 mg 4 times a day x7 days  Discharge Diagnoses:  Active Problems:   Severe sepsis   Cellulitis, face  hypertension Hypothyroidism Hyperlipidemia Diabetes mellitus Anxiety Depression Seizure disorder  Poor dentition Acute renal failure Acute encephalopathy  Discharge Condition: Improved, being discharged home  Diet recommendation: Carb modified low sodium  Filed Weights   03/03/14 2130 03/04/14 0500 03/05/14 0451  Weight: 106.2 kg (234 lb 2.1 oz) 107 kg (235 lb 14.3 oz) 102.785 kg (226 lb 9.6 oz)    History of present illness:  53 year old white male with poor dentition who presented to the emergency room on 6/10 with left mandible swelling plus mouth/tooth pain x3 days. He was found to have severe sepsis secondary to dental infection revealed on CT and admitted to the critical care service  Hospital Course:   Severe sepsis secondary to multiple dental abscess ease and secondary cellulitis of the face: Patient met criteria given a mild leukocytosis, but with hypotension and systolic blood pressure in the 70s as well as end organ decreased perfusion affecting renal function with creatinine of 2.10. In addition he also had some acute encephalopathy. Blood cultures drawn, but no growth to date. Given swelling, CT scan confirmed multiple dental abscess ease. Ear nose and throat on call at the time deferred at dental abscess he is to dentistry and oral surgery not available until 6/11. Patient started in  the interim on broad-spectrum IV antibiotics and responded well along with aggressive fluid resuscitation. Patient underwent [facility] x-ray I dentistry on 6/11 evening. This confirmed poor dentition. Patient is quite much more stable and recommended to be discharged on oral antibiotics, soft food and followup appointment with dentistry to remove remaining teeth  hypertension: Stable. Antihypertensives restarted on discharge.  Hypothyroidism: Stable. On IV Synthroid during hospitalization. Restart oral Synthroid on discharge.  Hyperlipidemia: Stable. Restart statin on discharge.  Diabetes mellitus: Normal and Glucophage. CBG stable. Restart by mouth meds on discharge.  Acute encephalopathy: Secondary part to sepsis as well as reports the patient in onset confusion after possible silent seizures. IV Depakote started while he was n.p.o. His home doses of Xanax, Celexa, Flexeril and trazodone were put on hold and highly sedating medications. This has since resolved patient is fully awake and alert by day of discharge  Anxiety: Patient is on Xanax 1 mg 4 times a day. Somewhat somnolent, so next dose taper back during his hospitalization.  Acute renal failure: Patient creatinine on admission was 2.10 with elevated BUN of 40. This is felt to be prerenal azotemia secondary to hypotension secondary to sepsis. Following aggressive fluid resuscitation, creatinine on day of discharge is 1.11 with normal BUN of 22.  Depression: Stable. When patient able to take by mouth, Celexa restarted.  Seizure disorder : Patient normally on Depakote. Valproic acid level within normal limits. Poor dentition: As underlying cause for cellulitis and secondary sepsis. Followup with dentistry as outpatient. For further tooth extraction.  Procedures: None Consultations:  Dentistry-[facility]  Critical care-[facility]  Discharge Exam: Filed Vitals:   03/05/14 9532  BP: 131/70  Pulse: 83  Temp: 98.6 F (37 C)  Resp: 17     General: Alert and oriented x3, no acute distress Cardiovascular: Regular rate and rhythm, S1 and S2 Respiratory: Clear to auscultation bilaterally  Discharge Instructions You were cared for by a hospitalist during your hospital stay. If you have any questions about your discharge medications or the care you received while you were in the hospital after you are discharged, you can call the unit and asked to speak with the hospitalist on call if the hospitalist that took care of you is not available. Once you are discharged, your primary care physician will handle any further medical issues. Please note that NO REFILLS for any discharge medications will be authorized once you are discharged, as it is imperative that you return to your primary care physician (or establish a relationship with a primary care physician if you do not have one) for your aftercare needs so that they can reassess your need for medications and monitor your lab values.     Medication List    STOP taking these medications       oxyCODONE-acetaminophen 5-325 MG per tablet  Commonly known as:  PERCOCET/ROXICET      TAKE these medications       albuterol 108 (90 BASE) MCG/ACT inhaler  Commonly known as:  PROVENTIL HFA;VENTOLIN HFA  Inhale 2 puffs into the lungs every 4 (four) hours as needed for wheezing or shortness of breath.     alprazolam 2 MG tablet  Commonly known as:  XANAX  Take 1 mg by mouth 4 (four) times daily.     aspirin EC 81 MG tablet  Take 81 mg by mouth daily.     atorvastatin 40 MG tablet  Commonly known as:  LIPITOR  Take 40 mg by mouth daily.     citalopram 40 MG tablet  Commonly known as:  CELEXA  Take 40 mg by mouth daily.     clindamycin 300 MG capsule  Commonly known as:  CLEOCIN  Take 1 capsule (300 mg total) by mouth 4 (four) times daily.     cyclobenzaprine 10 MG tablet  Commonly known as:  FLEXERIL  Take 10 mg by mouth 3 (three) times daily as needed for muscle spasms.      divalproex 500 MG 24 hr tablet  Commonly known as:  DEPAKOTE ER  Take 500 mg by mouth 2 (two) times daily.     Fenofibric Acid 135 MG Cpdr  Take 1 capsule by mouth daily.     furosemide 20 MG tablet  Commonly known as:  LASIX  Take 10-20 mg by mouth daily as needed for fluid.     ketoconazole 2 % cream  Commonly known as:  NIZORAL  Apply 1 application topically daily.     levothyroxine 175 MCG tablet  Commonly known as:  SYNTHROID, LEVOTHROID  Take 175 mcg by mouth daily.     lisinopril 5 MG tablet  Commonly known as:  PRINIVIL,ZESTRIL  Take 5 mg by mouth daily.     metFORMIN 850 MG tablet  Commonly known as:  GLUCOPHAGE  Take 850 mg by mouth every morning.     omeprazole 20 MG capsule  Commonly known as:  PRILOSEC  Take 20 mg by mouth daily.     ondansetron 4 MG tablet  Commonly known as:  ZOFRAN  Take 4 mg by mouth every 8 (eight) hours as needed for nausea or vomiting.  oxyCODONE 5 MG immediate release tablet  Commonly known as:  Oxy IR/ROXICODONE  Take 1 tablet (5 mg total) by mouth every 4 (four) hours as needed for severe pain.     tiotropium 18 MCG inhalation capsule  Commonly known as:  SPIRIVA  Place 18 mcg into inhaler and inhale daily.     traZODone 150 MG tablet  Commonly known as:  DESYREL  Take 150 mg by mouth at bedtime.       Allergies  Allergen Reactions  . Penicillins Other (See Comments)    Reaction unknown       Follow-up Information   Follow up with Horald Pollen L., MD In 1 month.   Specialty:  Family Medicine   Contact information:   [address] [place]  [address] [phone]        The results of significant diagnostics from this hospitalization (including imaging, microbiology, ancillary and laboratory) are listed below for reference.    Significant Diagnostic Studies: Dg Orthopantogram  03/04/2014   CLINICAL DATA:  53 year old male with left lower dental abscess. Initial  encounter.  EXAM: ORTHOPANTOGRAM/PANORAMIC  COMPARISON:  Face CT 03/03/2014.  FINDINGS: Multifocal poor dentition, as demonstrated on the comparison. Diffuse dental caries. Prominent periapical lucency at the residual anterior left mandibular bicuspid and posterior right mandibular bicuspid.  IMPRESSION: Widespread poor dentition.   Electronically Signed   By: Lars Pinks M.D.   On: 03/04/2014 20:37   Ct Head Wo Contrast  03/03/2014   CLINICAL DATA:  Seizure.  Dental infection.  EXAM: CT HEAD WITHOUT CONTRAST  CT MAXILLOFACIAL WITHOUT CONTRAST  TECHNIQUE: Multidetector CT imaging of the head and maxillofacial structures were performed using the standard protocol without intravenous contrast. Multiplanar CT image reconstructions of the maxillofacial structures were also generated.  COMPARISON:  CT head 07/30/2003  FINDINGS: CT HEAD FINDINGS  Ventricle size is normal. Negative for acute infarct. Negative for hemorrhage or mass. No significant chronic ischemia. No skull abnormality.  CT MAXILLOFACIAL FINDINGS  Numerous dental caries are present. Numerous areas of periapical lucency are present around teeth. Lucency around tooth 11 with cortical breakthrough and surrounding soft tissue swelling compatible with cellulitis. Lucency around tooth and tooth 21 and 30. Soft tissue abscess not definitely present however no intravenous contrast was given which could limit detection of soft tissue abscess.  Negative for facial fracture. No air-fluid levels in the sinuses. Mild mucosal thickening in the paranasal sinuses.  IMPRESSION: No acute intracranial abnormality.  Negative for facial fracture.  Multiple areas of dental infection. Possible cellulitis related to dental infection left maxilla.   Electronically Signed   By: Franchot Gallo M.D.   On: 03/03/2014 17:45   Dg Chest Port 1 View  03/05/2014   CLINICAL DATA:  Pulmonary edema  EXAM: PORTABLE CHEST - 1 VIEW  COMPARISON:  03/03/2014  FINDINGS: Cardiomediastinal  silhouette is stable. Mild improvement in aeration. No segmental infiltrate or pulmonary edema. Mild basilar atelectasis.  IMPRESSION: Mild improved aeration. No segmental infiltrate or pulmonary edema. Mild basilar atelectasis.   Electronically Signed   By: Lahoma Crocker M.D.   On: 03/05/2014 08:30   Dg Chest Port 1 View  03/03/2014   CLINICAL DATA:  Hypotension  EXAM: PORTABLE CHEST - 1 VIEW  COMPARISON:  None.  FINDINGS: Hypoventilation with decreased lung volume and bibasilar atelectasis. Vascular congestion may be due to hypoventilation. No edema or effusion.  IMPRESSION: Hypoventilation with low lung volumes and bibasilar atelectasis.   Electronically Signed  By: Franchot Gallo M.D.   On: 03/03/2014 16:07   Ct Maxillofacial Wo Cm  03/03/2014   CLINICAL DATA:  Seizure.  Dental infection.  EXAM: CT HEAD WITHOUT CONTRAST  CT MAXILLOFACIAL WITHOUT CONTRAST  TECHNIQUE: Multidetector CT imaging of the head and maxillofacial structures were performed using the standard protocol without intravenous contrast. Multiplanar CT image reconstructions of the maxillofacial structures were also generated.  COMPARISON:  CT head 07/30/2003  FINDINGS: CT HEAD FINDINGS  Ventricle size is normal. Negative for acute infarct. Negative for hemorrhage or mass. No significant chronic ischemia. No skull abnormality.  CT MAXILLOFACIAL FINDINGS  Numerous dental caries are present. Numerous areas of periapical lucency are present around teeth. Lucency around tooth 11 with cortical breakthrough and surrounding soft tissue swelling compatible with cellulitis. Lucency around tooth and tooth 21 and 30. Soft tissue abscess not definitely present however no intravenous contrast was given which could limit detection of soft tissue abscess.  Negative for facial fracture. No air-fluid levels in the sinuses. Mild mucosal thickening in the paranasal sinuses.  IMPRESSION: No acute intracranial abnormality.  Negative for facial fracture.  Multiple  areas of dental infection. Possible cellulitis related to dental infection left maxilla.   Electronically Signed   By: Franchot Gallo M.D.   On: 03/03/2014 17:45    Microbiology: Recent Results (from the past 240 hour(s))  CULTURE, BLOOD (ROUTINE X 2)     Status: None   Collection Time    03/03/14  3:01 PM      Result Value Ref Range Status   Specimen Description BLOOD HAND LEFT   Final   Special Requests BOTTLES DRAWN AEROBIC AND ANAEROBIC 10CC   Final   Culture  Setup Time     Final   Value: 03/03/2014 22:28     Performed at [facility]   Culture     Final   Value:        BLOOD CULTURE RECEIVED NO GROWTH TO DATE CULTURE WILL BE HELD FOR 5 DAYS BEFORE ISSUING A FINAL NEGATIVE REPORT     Performed at [facility]   Report Status PENDING   Incomplete  CULTURE, BLOOD (ROUTINE X 2)     Status: None   Collection Time    03/03/14  7:55 PM      Result Value Ref Range Status   Specimen Description BLOOD ARM LEFT   Final   Special Requests BOTTLES DRAWN AEROBIC ONLY 10CC   Final   Culture  Setup Time     Final   Value: 03/04/2014 01:06     Performed at [facility]   Culture     Final   Value:        BLOOD CULTURE RECEIVED NO GROWTH TO DATE CULTURE WILL BE HELD FOR 5 DAYS BEFORE ISSUING A FINAL NEGATIVE REPORT     Performed at [facility]   Report Status PENDING   Incomplete  MRSA PCR SCREENING     Status: None   Collection Time    03/03/14  9:34 PM      Result Value Ref Range Status   MRSA by PCR NEGATIVE  NEGATIVE Final   Comment:            The GeneXpert MRSA Assay (FDA     approved for NASAL specimens     only), is one component of a     comprehensive MRSA colonization     surveillance program. It is not  intended to diagnose MRSA     infection nor to guide or     monitor treatment for     MRSA infections.     Labs: Basic Metabolic Panel:  Recent Labs Lab 03/03/14 1500 03/03/14 1733 03/03/14 2054 03/04/14 0209 03/05/14 0715   NA 136* 139  --  141 140  K 5.7* 5.4*  --  5.7* 4.3  CL 95* 103  --  102 103  CO2 27 27  --  30 26  GLUCOSE 106* 92  --  91 95  BUN 40* 38*  --  29* 22  CREATININE 2.10* 1.90* 1.68* 1.51* 1.11  CALCIUM 9.4 8.3*  --  8.4 8.8  MG  --   --   --  1.8  --   PHOS  --   --   --  2.4  --    Liver Function Tests:  Recent Labs Lab 03/03/14 1733  AST 12  ALT 8  ALKPHOS 31*  BILITOT <0.2*  PROT 6.2  ALBUMIN 3.0*   No results found for this basename: LIPASE, AMYLASE,  in the last 168 hours No results found for this basename: AMMONIA,  in the last 168 hours CBC:  Recent Labs Lab 03/03/14 1500 03/03/14 2054 03/04/14 0209 03/05/14 0715  WBC 10.8* 8.0 7.6 7.9  NEUTROABS 4.7  --   --   --   HGB 11.5* 10.1* 10.2* 10.6*  HCT 35.6* 31.2* 32.3* 32.0*  MCV 94.9 96.6 97.6 93.3  PLT 160 136* 131* 172   Cardiac Enzymes: No results found for this basename: CKTOTAL, CKMB, CKMBINDEX, TROPONINI,  in the last 168 hours BNP: BNP (last 3 results) No results found for this basename: PROBNP,  in the last 8760 hours CBG:  Recent Labs Lab 03/04/14 1953 03/04/14 2241 03/05/14 0030 03/05/14 0337 03/05/14 0742  GLUCAP 106* 159* 142* 107* 100*       Signed:  KRISHNAN,SENDIL K  [facility] 03/05/2014, 11:24 AM

## 2014-03-05 NOTE — Discharge Instructions (Signed)
°  Dental Abscess °A dental abscess is a collection of infected fluid (pus) from a bacterial infection in the inner part of the tooth (pulp). It usually occurs at the end of the tooth's root.  °CAUSES  °· Severe tooth decay. °· Trauma to the tooth that allows bacteria to enter into the pulp, such as a broken or chipped tooth. °SYMPTOMS  °· Severe pain in and around the infected tooth. °· Swelling and redness around the abscessed tooth or in the mouth or face. °· Tenderness. °· Pus drainage. °· Bad breath. °· Bitter taste in the mouth. °· Difficulty swallowing. °· Difficulty opening the mouth. °· Nausea. °· Vomiting. °· Chills. °· Swollen neck glands. °DIAGNOSIS  °· A medical and dental history will be taken. °· An examination will be performed by tapping on the abscessed tooth. °· X-rays may be taken of the tooth to identify the abscess. °TREATMENT °The goal of treatment is to eliminate the infection. You may be prescribed antibiotic medicine to stop the infection from spreading. A root canal may be performed to save the tooth. If the tooth cannot be saved, it may be pulled (extracted) and the abscess may be drained.  °HOME CARE INSTRUCTIONS °· Only take over-the-counter or prescription medicines for pain, fever, or discomfort as directed by your caregiver. °· Rinse your mouth (gargle) often with salt water (¼ tsp salt in 8 oz [250 ml] of warm water) to relieve pain or swelling. °· Do not drive after taking pain medicine (narcotics). °· Do not apply heat to the outside of your face. °· Return to your dentist for further treatment as directed. °SEEK MEDICAL CARE IF: °· Your pain is not helped by medicine. °· Your pain is getting worse instead of better. °SEEK IMMEDIATE MEDICAL CARE IF: °· You have a fever or persistent symptoms for more than 2 3 days. °· You have a fever and your symptoms suddenly get worse. °· You have chills or a very bad headache. °· You have problems breathing or swallowing. °· You have trouble  opening your mouth. °· You have swelling in the neck or around the eye. °Document Released: 09/10/2005 Document Revised: 06/04/2012 Document Reviewed: 12/19/2010 °ExitCare® Patient Information ©2014 [employer]. ° ° °

## 2014-03-06 NOTE — ED Provider Notes (Signed)
This patient was seen in conjunction with Dr. Roxy CedarMcLennan (the resident physician). The documentation accurately reflects the patients ED evaluation and management with the following clarifications.  On my exams the patient the patient had variable levels of interactivity, and was not consistently oriented x3.  His evaluation for notable for AKI, hyperkalemia, and likely cellulitis of the face.    Patient was persistently hypotensive,and with (likely) new AKI, concern for possible sepsis, patient had IVF resuscitation (3L) and received broad spectrum ABX before being transferred to ICU.  I saw the ECG, agree with the interpretation.   CRITICAL CARE Performed by: Gerhard MunchLOCKWOOD, Velvia Mehrer Total critical care time: 35 Critical care time was exclusive of separately billable procedures and treating other patients. Critical care was necessary to treat or prevent imminent or life-threatening deterioration. Critical care was time spent personally by me on the following activities: development of treatment plan with patient and/or surrogate as well as nursing, discussions with consultants, evaluation of patient's response to treatment, examination of patient, obtaining history from patient or surrogate, ordering and performing treatments and interventions, ordering and review of laboratory studies, ordering and review of radiographic studies, pulse oximetry and re-evaluation of patient's condition.     Gerhard Munchobert Neri Vieyra, MD 03/06/14 Moses [id number]

## 2014-03-09 LAB — CULTURE, BLOOD (ROUTINE X 2): CULTURE: NO GROWTH

## 2014-03-10 LAB — CULTURE, BLOOD (ROUTINE X 2): Culture: NO GROWTH

## 2014-11-08 ENCOUNTER — Ambulatory Visit: Payer: Medicare Other | Admitting: Neurology

## 2014-11-15 ENCOUNTER — Ambulatory Visit (INDEPENDENT_AMBULATORY_CARE_PROVIDER_SITE_OTHER): Payer: Medicare Other | Admitting: Neurology

## 2014-11-15 ENCOUNTER — Encounter: Payer: Self-pay | Admitting: Neurology

## 2014-11-15 VITALS — BP 134/76 | HR 80 | Resp 16 | Ht 67.0 in | Wt 222.0 lb

## 2014-11-15 DIAGNOSIS — G40409 Other generalized epilepsy and epileptic syndromes, not intractable, without status epilepticus: Secondary | ICD-10-CM

## 2014-11-15 DIAGNOSIS — R569 Unspecified convulsions: Secondary | ICD-10-CM

## 2014-11-15 DIAGNOSIS — G40309 Generalized idiopathic epilepsy and epileptic syndromes, not intractable, without status epilepticus: Secondary | ICD-10-CM

## 2014-11-15 DIAGNOSIS — G40A19 Absence epileptic syndrome, intractable, without status epilepticus: Secondary | ICD-10-CM

## 2014-11-15 NOTE — Patient Instructions (Signed)
Seizure, Adult A seizure means there is unusual activity in the brain. A seizure can cause changes in attention or behavior. Seizures often cause shaking (convulsions). Seizures often last from 30 seconds to 2 minutes. HOME CARE   If you are given medicines, take them exactly as told by your doctor.  Keep all doctor visits as told.  Do not swim or drive until your doctor says it is okay.  Teach others what to do if you have a seizure. They should:  Lay you on the ground.  Put a cushion under your head.  Loosen any tight clothing around your neck.  Turn you on your side.  Stay with you until you get better. GET HELP RIGHT AWAY IF:   The seizure lasts longer than 2 to 5 minutes.  The seizure is very bad.  The person does not wake up after the seizure.  The person's attention or behavior changes. Drive the person to the emergency room or call your local emergency services (911 in [place] MAKE SURE YOU:   Understand these instructions.  Will watch your condition.  Will get help right away if you are not doing well or get worse. Document Released: 02/27/2008 Document Revised: 12/03/2011 Document Reviewed: 08/29/2011 ExitCare Patient Information 2015 [employer]. This information is not intended to replace advice given to you by your health care provider. Make sure you discuss any questions you have with your health care provider.  
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
## 2014-12-24 ENCOUNTER — Ambulatory Visit: Payer: Medicare Other | Admitting: Neurology

## 2015-01-03 ENCOUNTER — Ambulatory Visit: Payer: Medicare Other | Admitting: Neurology

## 2015-03-07 ENCOUNTER — Other Ambulatory Visit (INDEPENDENT_AMBULATORY_CARE_PROVIDER_SITE_OTHER): Payer: Medicare Other

## 2015-03-07 ENCOUNTER — Encounter: Payer: Self-pay | Admitting: Neurology

## 2015-03-07 ENCOUNTER — Ambulatory Visit (INDEPENDENT_AMBULATORY_CARE_PROVIDER_SITE_OTHER): Payer: Medicare Other | Admitting: Neurology

## 2015-03-07 VITALS — BP 128/80 | HR 65 | Resp 16 | Wt 228.0 lb

## 2015-03-07 DIAGNOSIS — R519 Headache, unspecified: Secondary | ICD-10-CM

## 2015-03-07 DIAGNOSIS — G40219 Localization-related (focal) (partial) symptomatic 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, intractable, without status epilepticus: Secondary | ICD-10-CM

## 2015-03-07 DIAGNOSIS — R51 Headache: Secondary | ICD-10-CM | POA: Diagnosis not present

## 2015-03-07 DIAGNOSIS — R413 Other amnesia: Secondary | ICD-10-CM | POA: Diagnosis not present

## 2015-03-07 MED ORDER — ZONISAMIDE 100 MG PO CAPS: ORAL_CAPSULE | ORAL | Status: DC
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
## 2015-03-08 ENCOUNTER — Encounter: Payer: Self-pay | Admitting: Neurology

## 2015-03-08 DIAGNOSIS — R51 Headache: Secondary | ICD-10-CM

## 2015-03-08 DIAGNOSIS — R519 Headache, unspecified: Secondary | ICD-10-CM | POA: Insufficient documentation

## 2015-03-08 LAB — BUN: BUN: 26 mg/dL — ABNORMAL HIGH (ref 6–23)

## 2015-03-08 LAB — CREATININE, SERUM: Creatinine, Ser: 0.92 mg/dL (ref 0.40–1.50)

## 2015-03-09 NOTE — Progress Notes (Signed)
I agree with the assessment and plan as directed by NP .The patient is known to me .   Monty Spicher, MD  

## 2015-03-09 NOTE — Progress Notes (Signed)
I agree with the assessment and plan as directed by Dr. Karel Jarvis,   The patient is known to me  I thank  her for taking care of this patient.    Dougles Kimmey, MD

## 2015-04-25 ENCOUNTER — Telehealth: Payer: Self-pay | Admitting: Neurology

## 2015-04-25 NOTE — Telephone Encounter (Signed)
Called to set up appt for 48 hour appt and left message for him to call to schedule. Stated August 22 at 9:00 am looked like the next 48 hour EEG appt. Available. SRR

## 2015-05-23 ENCOUNTER — Telehealth: Payer: Self-pay | Admitting: Neurology

## 2015-05-23 ENCOUNTER — Ambulatory Visit (INDEPENDENT_AMBULATORY_CARE_PROVIDER_SITE_OTHER): Payer: Medicare Other | Admitting: Neurology

## 2015-05-23 DIAGNOSIS — G40219 Localization-related (focal) (partial) symptomatic 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, intractable, without status epilepticus: Secondary | ICD-10-CM
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
## 2015-05-23 NOTE — Telephone Encounter (Signed)
When patient got home he pulled off electrodes. [name] is going to call the patient's wife back for advisement with EEG machine. Patient is very upset about something.
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
## 2015-06-03 ENCOUNTER — Telehealth: Payer: Self-pay | Admitting: Family Medicine

## 2015-06-03 NOTE — Telephone Encounter (Signed)
Patient's wife/[name] was notified of results & advisement.

## 2015-06-03 NOTE — Telephone Encounter (Signed)
-----   Message from Van Clines, MD sent at 06/03/2015  8:58 AM EDT ----- Pls let wife know I reviewed MRI brain, no evidence of tumor, stroke, or bleed. It shows age-related changes. The EEG did not show any seizure discharges. Continue current medications. Thanks

## 2015-06-07 ENCOUNTER — Ambulatory Visit: Payer: Medicare Other | Admitting: Neurology

## 2015-11-01 ENCOUNTER — Encounter (HOSPITAL_COMMUNITY): Payer: Self-pay | Admitting: Emergency Medicine

## 2015-11-01 ENCOUNTER — Emergency Department (HOSPITAL_COMMUNITY): Payer: Medicare Other

## 2015-11-01 ENCOUNTER — Inpatient Hospital Stay (HOSPITAL_COMMUNITY)
Admission: EM | Admit: 2015-11-01 | Discharge: 2015-11-03 | DRG: 918 | Disposition: A | Discharge status: Home / Self Care | Payer: Medicare Other | Attending: Internal Medicine | Admitting: Internal Medicine

## 2015-11-01 DIAGNOSIS — I10 Essential (primary) hypertension: Secondary | ICD-10-CM | POA: Diagnosis present

## 2015-11-01 DIAGNOSIS — J449 Chronic obstructive pulmonary disease, unspecified: Secondary | ICD-10-CM | POA: Diagnosis present

## 2015-11-01 DIAGNOSIS — T426X5A Adverse effect of other antiepileptic and sedative-hypnotic drugs, initial encounter: Principal | ICD-10-CM | POA: Diagnosis present

## 2015-11-01 DIAGNOSIS — E785 Hyperlipidemia, unspecified: Secondary | ICD-10-CM | POA: Diagnosis present

## 2015-11-01 DIAGNOSIS — E119 Type 2 diabetes mellitus without complications: Secondary | ICD-10-CM | POA: Diagnosis present

## 2015-11-01 DIAGNOSIS — G4710 Hypersomnia, unspecified: Secondary | ICD-10-CM | POA: Diagnosis present

## 2015-11-01 DIAGNOSIS — R51 Headache: Secondary | ICD-10-CM

## 2015-11-01 DIAGNOSIS — F419 Anxiety disorder, unspecified: Secondary | ICD-10-CM | POA: Diagnosis present

## 2015-11-01 DIAGNOSIS — G40219 Localization-related (focal) (partial) symptomatic 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, intractable, without status epilepticus: Secondary | ICD-10-CM | POA: Diagnosis present

## 2015-11-01 DIAGNOSIS — F329 Major depressive disorder, single episode, unspecified: Secondary | ICD-10-CM | POA: Diagnosis present

## 2015-11-01 DIAGNOSIS — R5383 Other fatigue: Secondary | ICD-10-CM | POA: Diagnosis not present

## 2015-11-01 DIAGNOSIS — Z9181 History of falling: Secondary | ICD-10-CM

## 2015-11-01 DIAGNOSIS — F17200 Nicotine dependence, unspecified, uncomplicated: Secondary | ICD-10-CM | POA: Diagnosis present

## 2015-11-01 DIAGNOSIS — G9340 Encephalopathy, unspecified: Secondary | ICD-10-CM | POA: Diagnosis not present

## 2015-11-01 DIAGNOSIS — E875 Hyperkalemia: Secondary | ICD-10-CM | POA: Diagnosis present

## 2015-11-01 DIAGNOSIS — D696 Thrombocytopenia, unspecified: Secondary | ICD-10-CM | POA: Diagnosis present

## 2015-11-01 DIAGNOSIS — R519 Headache, unspecified: Secondary | ICD-10-CM | POA: Diagnosis present

## 2015-11-01 DIAGNOSIS — L219 Seborrheic dermatitis, unspecified: Secondary | ICD-10-CM | POA: Diagnosis present

## 2015-11-01 DIAGNOSIS — F411 Generalized anxiety disorder: Secondary | ICD-10-CM | POA: Diagnosis present

## 2015-11-01 DIAGNOSIS — E039 Hypothyroidism, unspecified: Secondary | ICD-10-CM | POA: Diagnosis present

## 2015-11-01 LAB — CBC WITH DIFFERENTIAL/PLATELET
Basophils Absolute: 0 10*3/uL (ref 0.0–0.1)
Basophils Relative: 0 %
EOS PCT: 1 %
Eosinophils Absolute: 0.1 10*3/uL (ref 0.0–0.7)
HEMATOCRIT: 42.4 % (ref 39.0–52.0)
Hemoglobin: 14.3 g/dL (ref 13.0–17.0)
LYMPHS ABS: 3.1 10*3/uL (ref 0.7–4.0)
LYMPHS PCT: 49 %
MCH: 31.8 pg (ref 26.0–34.0)
MCHC: 33.7 g/dL (ref 30.0–36.0)
MCV: 94.2 fL (ref 78.0–100.0)
Monocytes Absolute: 0.8 10*3/uL (ref 0.1–1.0)
Monocytes Relative: 13 %
NEUTROS ABS: 2.3 10*3/uL (ref 1.7–7.7)
Neutrophils Relative %: 36 %
PLATELETS: 108 10*3/uL — AB (ref 150–400)
RBC: 4.5 MIL/uL (ref 4.22–5.81)
RDW: 14.2 % (ref 11.5–15.5)
WBC: 6.2 10*3/uL (ref 4.0–10.5)

## 2015-11-01 LAB — COMPREHENSIVE METABOLIC PANEL
ALT: 12 U/L — AB (ref 17–63)
AST: 17 U/L (ref 15–41)
Albumin: 3.3 g/dL — ABNORMAL LOW (ref 3.5–5.0)
Alkaline Phosphatase: 50 U/L (ref 38–126)
Anion gap: 10 (ref 5–15)
BUN: 24 mg/dL — AB (ref 6–20)
CHLORIDE: 100 mmol/L — AB (ref 101–111)
CO2: 28 mmol/L (ref 22–32)
CREATININE: 1 mg/dL (ref 0.61–1.24)
Calcium: 9.1 mg/dL (ref 8.9–10.3)
GLUCOSE: 106 mg/dL — AB (ref 65–99)
Potassium: 3.8 mmol/L (ref 3.5–5.1)
Sodium: 138 mmol/L (ref 135–145)
Total Bilirubin: 0.5 mg/dL (ref 0.3–1.2)
Total Protein: 6.9 g/dL (ref 6.5–8.1)

## 2015-11-01 LAB — AMMONIA: AMMONIA: 28 umol/L (ref 9–35)

## 2015-11-01 LAB — URINALYSIS, ROUTINE W REFLEX MICROSCOPIC
Glucose, UA: NEGATIVE mg/dL
HGB URINE DIPSTICK: NEGATIVE
Ketones, ur: 15 mg/dL — AB
Leukocytes, UA: NEGATIVE
NITRITE: NEGATIVE
PH: 7 (ref 5.0–8.0)
Protein, ur: NEGATIVE mg/dL
SPECIFIC GRAVITY, URINE: 1.025 (ref 1.005–1.030)

## 2015-11-01 LAB — RAPID URINE DRUG SCREEN, HOSP PERFORMED
Amphetamines: NOT DETECTED
Barbiturates: NOT DETECTED
Benzodiazepines: POSITIVE — AB
Cocaine: NOT DETECTED
Opiates: NOT DETECTED
TETRAHYDROCANNABINOL: POSITIVE — AB

## 2015-11-01 LAB — I-STAT ARTERIAL BLOOD GAS, ED
Acid-Base Excess: 1 mmol/L (ref 0.0–2.0)
Bicarbonate: 27.1 mEq/L — ABNORMAL HIGH (ref 20.0–24.0)
O2 Saturation: 95 %
PH ART: 7.363 (ref 7.350–7.450)
TCO2: 29 mmol/L (ref 0–100)
pCO2 arterial: 47.7 mmHg — ABNORMAL HIGH (ref 35.0–45.0)
pO2, Arterial: 82 mmHg (ref 80.0–100.0)

## 2015-11-01 LAB — CARBOXYHEMOGLOBIN
Carboxyhemoglobin: 1.1 % (ref 0.5–1.5)
Methemoglobin: 0.9 % (ref 0.0–1.5)
O2 Saturation: 78.2 %
TOTAL HEMOGLOBIN: 14.7 g/dL (ref 13.5–18.0)

## 2015-11-01 LAB — GLUCOSE, CAPILLARY
GLUCOSE-CAPILLARY: 92 mg/dL (ref 65–99)
GLUCOSE-CAPILLARY: 92 mg/dL (ref 65–99)
Glucose-Capillary: 94 mg/dL (ref 65–99)

## 2015-11-01 LAB — VALPROIC ACID LEVEL: VALPROIC ACID LVL: 103 ug/mL — AB (ref 50.0–100.0)

## 2015-11-01 LAB — CBG MONITORING, ED: GLUCOSE-CAPILLARY: 98 mg/dL (ref 65–99)

## 2015-11-01 LAB — ETHANOL: Alcohol, Ethyl (B): <5 mg/dL (ref <5)

## 2015-11-01 LAB — ACETAMINOPHEN LEVEL: Acetaminophen (Tylenol), Serum: <10 ug/mL — ABNORMAL LOW (ref 10–30)

## 2015-11-01 LAB — SALICYLATE LEVEL

## 2015-11-01 LAB — TROPONIN I

## 2015-11-01 LAB — TSH: TSH: 3.11 u[IU]/mL (ref 0.350–4.500)

## 2015-11-01 MED ORDER — SODIUM CHLORIDE 0.9 % IV SOLN
INTRAVENOUS | Status: AC
  Administered 2015-11-01: 21:00:00 via INTRAVENOUS

## 2015-11-01 MED ORDER — CETYLPYRIDINIUM CHLORIDE 0.05 % MT LIQD
7.0000 mL | Freq: Two times a day (BID) | OROMUCOSAL | Status: DC
  Administered 2015-11-01 – 2015-11-03 (×4): 7 mL via OROMUCOSAL

## 2015-11-01 MED ORDER — SODIUM CHLORIDE 0.9 % IV SOLN
INTRAVENOUS | Status: AC
  Administered 2015-11-01: 12:00:00 via INTRAVENOUS

## 2015-11-01 MED ORDER — ALPRAZOLAM 0.5 MG PO TABS: 1.0000 mg | ORAL_TABLET | Freq: Two times a day (BID) | ORAL | Status: DC

## 2015-11-01 MED ORDER — LORAZEPAM 2 MG/ML IJ SOLN: 0.5000 mg | Freq: Four times a day (QID) | INTRAMUSCULAR | Status: DC | PRN

## 2015-11-01 MED ORDER — SODIUM CHLORIDE 0.9% FLUSH
3.0000 mL | Freq: Two times a day (BID) | INTRAVENOUS | Status: DC
  Administered 2015-11-02 – 2015-11-03 (×3): 3 mL via INTRAVENOUS

## 2015-11-01 MED ORDER — TIOTROPIUM BROMIDE MONOHYDRATE 18 MCG IN CAPS
18.0000 ug | ORAL_CAPSULE | Freq: Every day | RESPIRATORY_TRACT | Status: DC
  Administered 2015-11-02 – 2015-11-03 (×2): 18 ug via RESPIRATORY_TRACT
  Filled 2015-11-01: qty 5

## 2015-11-01 MED ORDER — LORAZEPAM 2 MG/ML IJ SOLN
0.5000 mg | Freq: Once | INTRAMUSCULAR | Status: DC
Start: 2015-11-01 — End: 2015-11-01

## 2015-11-01 MED ORDER — SODIUM CHLORIDE 0.9% FLUSH
3.0000 mL | Freq: Two times a day (BID) | INTRAVENOUS | Status: DC
  Administered 2015-11-02 – 2015-11-03 (×2): 3 mL via INTRAVENOUS

## 2015-11-01 MED ORDER — LEVETIRACETAM 500 MG/5ML IV SOLN
500.0000 mg | Freq: Two times a day (BID) | INTRAVENOUS | Status: DC
  Administered 2015-11-01 – 2015-11-03 (×4): 500 mg via INTRAVENOUS
  Filled 2015-11-01 (×5): qty 5

## 2015-11-01 MED ORDER — LEVOTHYROXINE SODIUM 75 MCG PO TABS
150.0000 ug | ORAL_TABLET | Freq: Every day | ORAL | Status: DC
  Administered 2015-11-02 – 2015-11-03 (×2): 150 ug via ORAL
  Filled 2015-11-01 (×2): qty 2

## 2015-11-01 MED ORDER — ALBUTEROL SULFATE HFA 108 (90 BASE) MCG/ACT IN AERS: 2.0000 | INHALATION_SPRAY | RESPIRATORY_TRACT | Status: DC | PRN

## 2015-11-01 MED ORDER — INSULIN ASPART 100 UNIT/ML ~~LOC~~ SOLN
0.0000 [IU] | Freq: Three times a day (TID) | SUBCUTANEOUS | Status: DC
  Administered 2015-11-02: 1 [IU] via SUBCUTANEOUS

## 2015-11-01 NOTE — Consult Note (Signed)
NEURO HOSPITALIST CONSULT NOTE   Requestig physician: teaching service   Reason for Consult: lethargy  HPI:                                                                                                                                          [name] is an 55 y.o. male brought in by ambulance, who presents to the Emergency Department s/p a fall after exhibiting generalized weakness. Pt was found on the floor of the bathroom after experiencing bladder incontinence. Pt denies any head, neck, or back pain. Pt's family attempted to help pt for 2 hours PTA. Pt is alert to verbal stimuli but is lethargic per EMS.   Patient's wife is at the bedside. She reports that he has waxing and waning episodes of decreased mental status. She states that happens approximately 4 times a year. She states over the last several weeks he's been increasingly lethargic. He did fall last week and since that time has had general difficulty with his activities of daily living. She states she stood him up from the toilet or earlier this evening and his legs just buckled. She was unable to get him to ambulate out the door. Patient has a history of seizure disorder but no seizure activity noted. He is currently on Depakote 1 g twice a day.  HE is on multiple sedating medications including: Xanax, Depakote and Trazodone. VPA level 103  Past Medical History  Diagnosis Date  . Seizures (HCC)   . Hypertension   . Emphysema of lung (HCC)   . Depression   . Diabetes mellitus without complication (HCC)   . Thyroid disease   . Hyperlipidemia   . Hiatal hernia   . Anemia   . GERD (gastroesophageal reflux disease)   . Memory loss     Past Surgical History  Procedure Laterality Date  . Tonsillectomy and adenoidectomy    . Dental surgery    . Eye surgery    . Appendectomy      Family History  Problem Relation Age of Onset  . Stroke Mother   . Heart disease Father   . Thyroid disease  Father   . Thyroid disease Brother     Social History:  reports that he has been smoking E-cigarettes.  He has never used smokeless tobacco. He reports that he does not drink alcohol or use illicit drugs.  Allergies  Allergen Reactions  . Penicillins Other (See Comments)    Reaction unknown  . Topiramate Other (See Comments)  . Tegretol [Carbamazepine] Anxiety and Other (See Comments)    Induces seizures    MEDICATIONS:  Current Facility-Administered Medications  Medication Dose Route Frequency Provider Last Rate Last Dose  . 0.9 %  sodium chloride infusion   Intravenous Continuous Yolanda Manges, DO       Current Outpatient Prescriptions  Medication Sig Dispense Refill  . albuterol (PROVENTIL HFA;VENTOLIN HFA) 108 (90 BASE) MCG/ACT inhaler Inhale 2 puffs into the lungs every 4 (four) hours as needed for wheezing or shortness of breath.    . alprazolam (XANAX) 2 MG tablet Take 1 mg by mouth 4 (four) times daily.     . Aspirin-Salicylamide-Caffeine (BC HEADACHE POWDER PO) Take 2 packets by mouth 3 (three) times daily as needed (headache).    Marland Kitchen atorvastatin (LIPITOR) 40 MG tablet Take 40 mg by mouth daily.    . citalopram (CELEXA) 20 MG tablet Take 20 mg by mouth daily.     . divalproex (DEPAKOTE ER) 250 MG 24 hr tablet Take 500 mg by mouth 2 (two) times daily.     Marland Kitchen ketoconazole (NIZORAL) 2 % cream Apply 1 application topically daily.    Marland Kitchen levothyroxine (SYNTHROID, LEVOTHROID) 150 MCG tablet Take 150 mcg by mouth daily before breakfast.     . lisinopril (PRINIVIL,ZESTRIL) 5 MG tablet Take 5 mg by mouth daily.    . metFORMIN (GLUCOPHAGE-XR) 500 MG 24 hr tablet Take 500 mg by mouth daily with breakfast.    . omeprazole (PRILOSEC) 20 MG capsule Take 20 mg by mouth daily as needed (acid reflux).     Marland Kitchen tiotropium (SPIRIVA) 18 MCG inhalation capsule Place 18 mcg into inhaler and  inhale daily.    . traZODone (DESYREL) 100 MG tablet Take 100 mg by mouth at bedtime. Take 1 tablet daily        ROS:                                                                                                                                       History obtained from unobtainable from patient due to mental status   Blood pressure 124/78, pulse 78, temperature 97.5 F (36.4 C), temperature source Oral, resp. rate 16, SpO2 94 %.   Neurologic Examination:                                                                                                      HEENT-  Normocephalic, no lesions, without obvious abnormality.  Normal external eye and conjunctiva.  Normal TM's bilaterally.  Normal auditory canals and external ears. Normal external nose, mucus membranes and  septum.  Normal pharynx. Cardiovascular- S1, S2 normal, pulses palpable throughout   Lungs- chest clear, no wheezing, rales, normal symmetric air entry Abdomen- normal findings: no bruits heard Extremities- no clubbing Lymph-no adenopathy palpable Musculoskeletal-no joint tenderness, deformity or swelling Skin-warm and dry, no hyperpigmentation, vitiligo, or suspicious lesions  Neurological Examination Mental Status: Obtunded but awakens sternal rub and localizes.  Cranial Nerves: II: Discs flat bilaterally; blinks to threat, pupils equal, round, reactive to light and accommodation III,IV, VI: ptosis not present, doll's intact V,VII: smile symmetric, facial light touch sensation normal bilaterally VIII: hearing normal bilaterally IX,X: uvula rises symmetrically XI: bilateral shoulder shrug XII: midline tongue extension Motor: Moving all extremities antigravity Sensory: withdraws from pain Deep Tendon Reflexes: 2+ and symmetric throughout Plantars: Right: downgoing   Left: downgoing       Lab Results: Basic Metabolic Panel:  Recent Labs Lab 11/01/15 0432  NA 138  K 3.8  CL 100*  CO2 28  GLUCOSE 106*   BUN 24*  CREATININE 1.00  CALCIUM 9.1    Liver Function Tests:  Recent Labs Lab 11/01/15 0432  AST 17  ALT 12*  ALKPHOS 50  BILITOT 0.5  PROT 6.9  ALBUMIN 3.3*   No results for input(s): LIPASE, AMYLASE in the last 168 hours.  Recent Labs Lab 11/01/15 0757  AMMONIA 28    CBC:  Recent Labs Lab 11/01/15 0432  WBC 6.2  NEUTROABS 2.3  HGB 14.3  HCT 42.4  MCV 94.2  PLT 108*    Cardiac Enzymes: No results for input(s): CKTOTAL, CKMB, CKMBINDEX, TROPONINI in the last 168 hours.  Lipid Panel: No results for input(s): CHOL, TRIG, HDL, CHOLHDL, VLDL, LDLCALC in the last 168 hours.  CBG: No results for input(s): GLUCAP in the last 168 hours.  Microbiology: Results for orders placed or performed during the hospital encounter of 03/03/14  Blood culture (routine x 2)     Status: None   Collection Time: 03/03/14  3:01 PM  Result Value Ref Range Status   Specimen Description BLOOD HAND LEFT  Final   Special Requests BOTTLES DRAWN AEROBIC AND ANAEROBIC 10CC  Final   Culture  Setup Time   Final    03/03/2014 22:28 Performed at [facility]   Culture   Final    NO GROWTH 5 DAYS Performed at [facility]   Report Status 03/09/2014 FINAL  Final  Blood culture (routine x 2)     Status: None   Collection Time: 03/03/14  7:55 PM  Result Value Ref Range Status   Specimen Description BLOOD ARM LEFT  Final   Special Requests BOTTLES DRAWN AEROBIC ONLY 10CC  Final   Culture  Setup Time   Final    03/04/2014 01:06 Performed at [facility]   Culture   Final    NO GROWTH 5 DAYS Performed at [facility]   Report Status 03/10/2014 FINAL  Final  MRSA PCR Screening     Status: None   Collection Time: 03/03/14  9:34 PM  Result Value Ref Range Status   MRSA by PCR NEGATIVE NEGATIVE Final    Comment:        The GeneXpert MRSA Assay (FDA approved for NASAL specimens only), is one component of a comprehensive MRSA colonization surveillance  program. It is not intended to diagnose MRSA infection nor to guide or monitor treatment for MRSA infections.    Coagulation Studies: No results for input(s): LABPROT, INR in the last 72 hours.  Imaging: Ct Head Wo Contrast  11/01/2015  CLINICAL DATA:  Acute onset of lethargy and generalized weakness. Status post fall in bathroom. Incontinence. Initial encounter. EXAM: CT HEAD WITHOUT CONTRAST TECHNIQUE: Contiguous axial images were obtained from the base of the skull through the vertex without intravenous contrast. COMPARISON:  CT of the head performed 03/03/2014 FINDINGS: There is no evidence of acute infarction, mass lesion, or intra- or extra-axial hemorrhage on CT. The posterior fossa, including the cerebellum, brainstem and fourth ventricle, is within normal limits. The third and lateral ventricles, and basal ganglia are unremarkable in appearance. The cerebral hemispheres are symmetric in appearance, with normal gray-white differentiation. No mass effect or midline shift is seen. There is no evidence of fracture; visualized osseous structures are unremarkable in appearance. The orbits are within normal limits. The paranasal sinuses and mastoid air cells are well-aerated. No significant soft tissue abnormalities are seen. IMPRESSION: No evidence of traumatic intracranial injury or fracture. Electronically Signed   By: Roanna Raider M.D.   On: 11/01/2015 05:08       Assessment and plan per attending neurologist  Felicie Morn PA-C [facility] [phone]  11/01/2015, 11:30 AM   Assessment/Plan:  55 YO lethargic male with supra-thearapeutic VPA level. Etiology of lethargy not fully clear but suspect polypharmacy likely playing a role. At this time would change Depakote to Keppra 500 mg BID and stop Trazodone. Would also decrease Xanax to 1 MG BID and eventually titrate to off over time. Will follow up as needed.   Elspeth Cho,  DO [facility] [phone]  If 7pm- 7am, please page neurology on call as listed in AMION.

## 2015-11-01 NOTE — ED Provider Notes (Signed)
CSN: [id number]     Arrival date & time 11/01/15  0345 History  By signing my name below, I, Rohini Rajnarayanan, attest that this documentation has been prepared under the direction and in the presence of Shon Baton, MD Electronically Signed: Charlean Merl, ED Scribe 11/01/2015 at 4:07 AM.    Chief Complaint  Patient presents with  . Fall  . Fatigue  LEVEL 5 CAVEAT  The history is provided by the EMS personnel. The history is limited by the condition of the patient. No language interpreter was used.  HPI Comments: HPI Comments: [name] is a 55 y.o. male brought in by ambulance, who presents to the Emergency Department s/p a fall after exhibiting generalized weakness. Pt was found on the floor of the bathroom after experiencing bladder incontinence. Pt denies any head, neck, or back pain. Pt's family attempted to help pt for 2 hours PTA. Pt is alert to verbal stimuli but is lethargic per EMS.   Patient's wife is at the bedside. She reports that he has waxing and waning episodes of decreased mental status. She states that happens approximately 4 times a year. She states over the last several weeks he's been increasingly lethargic. He did fall last week and since that time has had general difficulty with his activities of daily living. She states she stood him up from the toilet or earlier this evening and his legs just buckled. She was unable to get him to ambulate out the door.  Patient has a history of seizure disorder but no seizure activity noted. He is currently on Depakote 1 g twice a day.   Past Medical History  Diagnosis Date  . Seizures (HCC)   . Hypertension   . Emphysema of lung (HCC)   . Depression   . Diabetes mellitus without complication (HCC)   . Thyroid disease   . Hyperlipidemia   . Hiatal hernia   . Anemia   . GERD (gastroesophageal reflux disease)   . Memory loss    Past Surgical History  Procedure Laterality Date  . Tonsillectomy and adenoidectomy     . Dental surgery    . Eye surgery    . Appendectomy     Family History  Problem Relation Age of Onset  . Stroke Mother   . Heart disease Father   . Thyroid disease Father   . Thyroid disease Brother    Social History  Substance Use Topics  . Smoking status: Current Every Day Smoker    Types: E-cigarettes  . Smokeless tobacco: Never Used  . Alcohol Use: No    Review of Systems  Unable to perform ROS: Mental status change  Genitourinary:       Bladder/urinary incontinence.  Musculoskeletal: Negative for back pain and neck pain.  Neurological: Negative for headaches.    Allergies  Penicillins; Topiramate; and Tegretol  Home Medications   Prior to Admission medications   Medication Sig Start Date End Date Taking? Authorizing Provider  albuterol (PROVENTIL HFA;VENTOLIN HFA) 108 (90 BASE) MCG/ACT inhaler Inhale 2 puffs into the lungs every 4 (four) hours as needed for wheezing or shortness of breath.   Yes Historical Provider, MD  alprazolam Prudy Feeler) 2 MG tablet Take 1 mg by mouth 4 (four) times daily.    Yes Historical Provider, MD  Aspirin-Salicylamide-Caffeine (BC HEADACHE POWDER PO) Take 2 packets by mouth 3 (three) times daily as needed (headache).   Yes Historical Provider, MD  atorvastatin (LIPITOR) 40 MG tablet Take 40 mg by  mouth daily.   Yes Historical Provider, MD  citalopram (CELEXA) 20 MG tablet Take 20 mg by mouth daily.  02/25/15  Yes Historical Provider, MD  divalproex (DEPAKOTE ER) 250 MG 24 hr tablet Take 500 mg by mouth 2 (two) times daily.    Yes Historical Provider, MD  ketoconazole (NIZORAL) 2 % cream Apply 1 application topically daily.   Yes Historical Provider, MD  levothyroxine (SYNTHROID, LEVOTHROID) 150 MCG tablet Take 150 mcg by mouth daily before breakfast.  12/30/14  Yes Historical Provider, MD  lisinopril (PRINIVIL,ZESTRIL) 5 MG tablet Take 5 mg by mouth daily.   Yes Historical Provider, MD  metFORMIN (GLUCOPHAGE-XR) 500 MG 24 hr tablet Take 500 mg  by mouth daily with breakfast.   Yes Historical Provider, MD  omeprazole (PRILOSEC) 20 MG capsule Take 20 mg by mouth daily as needed (acid reflux).    Yes Historical Provider, MD  tiotropium (SPIRIVA) 18 MCG inhalation capsule Place 18 mcg into inhaler and inhale daily.   Yes Historical Provider, MD  traZODone (DESYREL) 100 MG tablet Take 100 mg by mouth at bedtime. Take 1 tablet daily 02/25/15  Yes Historical Provider, MD   BP 124/70 mmHg  Pulse 76  Temp(Src) 97.5 F (36.4 C) (Oral)  Resp 13  SpO2 97% Physical Exam  Constitutional:  ABCs intact, nontoxic  HENT:  Head: Normocephalic and atraumatic.  Mucous membranes moist  Eyes:  Pupils 4 mm reactive bilaterally  Cardiovascular: Normal rate, regular rhythm and normal heart sounds.   No murmur heard. Pulmonary/Chest: Effort normal and breath sounds normal. No respiratory distress. He has no wheezes.  Abdominal: Soft. Bowel sounds are normal. There is no tenderness. There is no rebound.  Musculoskeletal: He exhibits no edema.  Neurological: He is alert.  Somnolent but arousable, follows simple commands, moves all 4 extremities symmetrically  Skin: Skin is warm and dry.  Nursing note and vitals reviewed.   ED Course  Procedures  DIAGNOSTIC STUDIES: Oxygen Saturation is 91% on RA, low by my interpretation.    COORDINATION OF CARE: 3:58 AM-Discussed treatment plan which includes blood work, urinalysis, and drug screen .   Labs Review Labs Reviewed  CBC WITH DIFFERENTIAL/PLATELET - Abnormal; Notable for the following:    Platelets 108 (*)    All other components within normal limits  COMPREHENSIVE METABOLIC PANEL - Abnormal; Notable for the following:    Chloride 100 (*)    Glucose, Bld 106 (*)    BUN 24 (*)    Albumin 3.3 (*)    ALT 12 (*)    All other components within normal limits  URINALYSIS, ROUTINE W REFLEX MICROSCOPIC (NOT AT [facility]) - Abnormal; Notable for the following:    Color, Urine AMBER (*)    Bilirubin Urine  SMALL (*)    Ketones, ur 15 (*)    All other components within normal limits  URINE RAPID DRUG SCREEN, HOSP PERFORMED - Abnormal; Notable for the following:    Benzodiazepines POSITIVE (*)    Tetrahydrocannabinol POSITIVE (*)    All other components within normal limits  VALPROIC ACID LEVEL - Abnormal; Notable for the following:    Valproic Acid Lvl 103 (*)    All other components within normal limits  I-STAT ARTERIAL BLOOD GAS, ED - Abnormal; Notable for the following:    pCO2 arterial 47.7 (*)    Bicarbonate 27.1 (*)    All other components within normal limits  ETHANOL  AMMONIA  CBG MONITORING, ED    Imaging Review  Ct Head Wo Contrast  11/01/2015  CLINICAL DATA:  Acute onset of lethargy and generalized weakness. Status post fall in bathroom. Incontinence. Initial encounter. EXAM: CT HEAD WITHOUT CONTRAST TECHNIQUE: Contiguous axial images were obtained from the base of the skull through the vertex without intravenous contrast. COMPARISON:  CT of the head performed 03/03/2014 FINDINGS: There is no evidence of acute infarction, mass lesion, or intra- or extra-axial hemorrhage on CT. The posterior fossa, including the cerebellum, brainstem and fourth ventricle, is within normal limits. The third and lateral ventricles, and basal ganglia are unremarkable in appearance. The cerebral hemispheres are symmetric in appearance, with normal gray-white differentiation. No mass effect or midline shift is seen. There is no evidence of fracture; visualized osseous structures are unremarkable in appearance. The orbits are within normal limits. The paranasal sinuses and mastoid air cells are well-aerated. No significant soft tissue abnormalities are seen. IMPRESSION: No evidence of traumatic intracranial injury or fracture. Electronically Signed   By: Roanna Raider M.D.   On: 11/01/2015 05:08   I have personally reviewed and evaluated these images and lab results as part of my medical decision-making.    EKG Interpretation None      MDM   Final diagnoses:  None   She presents with altered mental status, lethargy, and increased somnolence. No obvious focal deficits.  He is on multiple medications that can cause somnolence including trazodone, Depakote, and Xanax.  Workup initiated. UDS positive for benzos and THC.  Depakote level CIII. Discuss with neurologist. We'll obtain ammonia level as Depakote can sometimes cause hyperammonia.  Dr. Hosie Poisson requesting callback after ammonia level obtained.  I personally performed the services described in this documentation, which was scribed in my presence. The recorded information has been reviewed and is accurate.      Shon Baton, MD 11/01/15 [phone]

## 2015-11-01 NOTE — ED Notes (Signed)
Attempted report x1. 

## 2015-11-01 NOTE — Progress Notes (Signed)
Report received from Brooke, RN for admission to 5W03 

## 2015-11-01 NOTE — ED Notes (Signed)
Discussed pt status with Dr. Jeraldine Loots - pt is at baseline w/ not following commands, minimal verbal communication, opens eyes to voice.

## 2015-11-01 NOTE — ED Notes (Signed)
CBG 98  

## 2015-11-01 NOTE — ED Notes (Signed)
Pt presents from home with [facility] for fall and generalized weakness; pt was found in floor of bathroom and had an episode of incontinence- family reports pt laid in floor for 2 hrs because she could not get him out of floor; pt is alert to verbal stimuli per EMS and appears lethargic; family reports lethargy and slurred speech is normal for the patient; pt denies head, neck or back pain and passed EMS' c-spine precautions assessment

## 2015-11-01 NOTE — H&P (Signed)
Date: 11/01/2015               Patient Name:  [name] MRN: [id number]  DOB: 01-Feb-1961 Age / Sex: 55 y.o., male   PCP: Danny Mis, MD         Medical Service: Internal Medicine Teaching Service         Attending Physician: Dr. Gardiner Barefoot, MD    First Contact: [name] Pager: [phone]  Second Contact: [name] Pager: [phone]       After Hours (After 5p/  First Contact Pager: [phone]  weekends / holidays): Second Contact Pager: [phone]   Chief Complaint: increased generalized weakness x 2 weeks and somnolence since last night  History of Present Illness: [name] is a 55 year old male with PMH of seizure disorder, chronic headaches, HTN, DM, anxiety and depression here with generalized weakness and hypersomnolence.  Hx is obtained from his wife because of his somnolence.  Wife reports he has generalized weakness and intermittent confusion since seizure d/o diagnosed in 2004.  She says for the past two weeks he has seemed weaker than usual.  Sometimes he will become confused and think she is his sister, but this is not new.  She says he falls about 3 x per month because of weakness.  He fell two weeks ago and hit the back of his head on the wooden dining room table leg.  He did not loose consciousness, have bruising or change in behavior at that time.  He fell again last night while she was helping him in the bathroom.  She says he often needs help getting to and from the bathroom at night due to confusion and she feels his meds make him pretty drowsy.  He ended up falling after slipping on floor (she thinks urine got on the floor) and landed on his bottom.  He did not hit his head.  She says she attempted to lift him for about two hours but was unable to so she called 911.    Wife says he has not complained of chills or had decreased appetite, N/V, diarrhea, dark or blood stools, focal weakness or dyspnea.  He has not had recent seizure activity.  He has COPD but  no dyspnea.  He complained to his wife about chest pain last week.  He occasionally smokes THC but she says he does not use other illicit substances or EtOH.  He quit smoking 4 years ago but will occasionally have a cigarette with friends.  She has no reason to think he would have taken any other substances or overdosed on any medications.  There have been no new med changes.  He takes Depakote 500mg  BID, Xanax 2mg  3-4x daily (every day), Celexa 20mg  daily and Trazodone 100mg  daily.  Zonegran was supposed to be added to his regimen in June 2016 but she says the pharmacy never received the rx and she did not hear back from neuro office when she tried to call.  He has a walker at home that she has to encourage him to use.  She feels he is more confused at night and likens it to "sundowning."  She says this nighttime confusion has been present for about 6 months but she has not mentioned it to his neurologist.      In the ED:  VSS, he is asleep, arouses to voice but then drifts off again.   CT head - no acute abnormalities.  Neuro consulted  and IMTS asked to admit.  Meds: No current facility-administered medications for this encounter.   Current Outpatient Prescriptions  Medication Sig Dispense Refill  . albuterol (PROVENTIL HFA;VENTOLIN HFA) 108 (90 BASE) MCG/ACT inhaler Inhale 2 puffs into the lungs every 4 (four) hours as needed for wheezing or shortness of breath.    . alprazolam (XANAX) 2 MG tablet Take 1 mg by mouth 4 (four) times daily.     . Aspirin-Salicylamide-Caffeine (BC HEADACHE POWDER PO) Take 2 packets by mouth 3 (three) times daily as needed (headache).    Marland Kitchen atorvastatin (LIPITOR) 40 MG tablet Take 40 mg by mouth daily.    . citalopram (CELEXA) 20 MG tablet Take 20 mg by mouth daily.     . divalproex (DEPAKOTE ER) 250 MG 24 hr tablet Take 500 mg by mouth 2 (two) times daily.     Marland Kitchen ketoconazole (NIZORAL) 2 % cream Apply 1 application topically daily.    Marland Kitchen levothyroxine (SYNTHROID,  LEVOTHROID) 150 MCG tablet Take 150 mcg by mouth daily before breakfast.     . lisinopril (PRINIVIL,ZESTRIL) 5 MG tablet Take 5 mg by mouth daily.    . metFORMIN (GLUCOPHAGE-XR) 500 MG 24 hr tablet Take 500 mg by mouth daily with breakfast.    . omeprazole (PRILOSEC) 20 MG capsule Take 20 mg by mouth daily as needed (acid reflux).     Marland Kitchen tiotropium (SPIRIVA) 18 MCG inhalation capsule Place 18 mcg into inhaler and inhale daily.    . traZODone (DESYREL) 100 MG tablet Take 100 mg by mouth at bedtime. Take 1 tablet daily      Allergies: Allergies as of 11/01/2015 - Review Complete 11/01/2015  Allergen Reaction Noted  . Penicillins Other (See Comments) 04/02/2012  . Topiramate Other (See Comments) 03/07/2015  . Tegretol [carbamazepine] Anxiety and Other (See Comments) 03/07/2015   Past Medical History  Diagnosis Date  . Seizures (HCC)   . Hypertension   . Emphysema of lung (HCC)   . Depression   . Diabetes mellitus without complication (HCC)   . Thyroid disease   . Hyperlipidemia   . Hiatal hernia   . Anemia   . GERD (gastroesophageal reflux disease)   . Memory loss    Past Surgical History  Procedure Laterality Date  . Tonsillectomy and adenoidectomy    . Dental surgery    . Eye surgery    . Appendectomy     Family History  Problem Relation Age of Onset  . Stroke Mother   . Heart disease Father   . Thyroid disease Father   . Thyroid disease Brother    Social History   Social History  . Marital Status: Married    Spouse Name: [name]   . Number of Children: 2  . Years of Education: GED   Occupational History  .      Disabled   Social History Main Topics  . Smoking status: Current Every Day Smoker    Types: E-cigarettes  . Smokeless tobacco: Never Used  . Alcohol Use: No  . Drug Use: No  . Sexual Activity: Not on file   Other Topics Concern  . Not on file   Social History Narrative   Patient lives at home with his wife [name]).    Disabled.   Education  GED.   Left handed.   Caffeine sweet tea daily and diet coke.    Review of Systems: History could not be obtained from patient because of his acute encephalopathy. Rest per HPI.  Physical Exam: Blood pressure 110/67, pulse 75, temperature 97.5 F (36.4 C), temperature source Oral, resp. rate 18, SpO2 97 %. General: resting in bed in NAD HEENT: PERRL, EOMI, no scleral icterus, facial flushing Cardiac: RRR, no rubs, murmurs or gallops Pulm: clear to auscultation bilaterally, moving normal volumes of air Abd: soft, nontender, nondistended, BS present Ext: warm and well perfused, no pedal edema,  Neuro: alert and oriented x 1 (self), follows basic commands, cranial nerves II-XII grossly intact, upper extremity strength 5/5 B/L, lower extremity strength 4/5 B/L, sensation grossly intact and equal, he moves spontaneously and is able to roll over for lung exam, normal tone, no myoclonus  Lab results: Basic Metabolic Panel:  Recent Labs  69/62/95 0432  NA 138  K 3.8  CL 100*  CO2 28  GLUCOSE 106*  BUN 24*  CREATININE 1.00  CALCIUM 9.1   Liver Function Tests:  Recent Labs  11/01/15 0432  AST 17  ALT 12*  ALKPHOS 50  BILITOT 0.5  PROT 6.9  ALBUMIN 3.3*    Recent Labs  11/01/15 0757  AMMONIA 28   CBC:  Recent Labs  11/01/15 0432  WBC 6.2  NEUTROABS 2.3  HGB 14.3  HCT 42.4  MCV 94.2  PLT 108*   Urine Drug Screen: Drugs of Abuse     Component Value Date/Time   LABOPIA NONE DETECTED 11/01/2015 0537   COCAINSCRNUR NONE DETECTED 11/01/2015 0537   LABBENZ POSITIVE* 11/01/2015 0537   AMPHETMU NONE DETECTED 11/01/2015 0537   THCU POSITIVE* 11/01/2015 0537   LABBARB NONE DETECTED 11/01/2015 0537    Alcohol Level:  Recent Labs  11/01/15 0432  ETH <5   Urinalysis:  Recent Labs  11/01/15 0537  COLORURINE AMBER*  LABSPEC 1.025  PHURINE 7.0  GLUCOSEU NEGATIVE  HGBUR NEGATIVE  BILIRUBINUR SMALL*  KETONESUR 15*  PROTEINUR NEGATIVE  NITRITE NEGATIVE   LEUKOCYTESUR NEGATIVE    Imaging results:  Ct Head Wo Contrast  11/01/2015  CLINICAL DATA:  Acute onset of lethargy and generalized weakness. Status post fall in bathroom. Incontinence. Initial encounter. EXAM: CT HEAD WITHOUT CONTRAST TECHNIQUE: Contiguous axial images were obtained from the base of the skull through the vertex without intravenous contrast. COMPARISON:  CT of the head performed 03/03/2014 FINDINGS: There is no evidence of acute infarction, mass lesion, or intra- or extra-axial hemorrhage on CT. The posterior fossa, including the cerebellum, brainstem and fourth ventricle, is within normal limits. The third and lateral ventricles, and basal ganglia are unremarkable in appearance. The cerebral hemispheres are symmetric in appearance, with normal gray-white differentiation. No mass effect or midline shift is seen. There is no evidence of fracture; visualized osseous structures are unremarkable in appearance. The orbits are within normal limits. The paranasal sinuses and mastoid air cells are well-aerated. No significant soft tissue abnormalities are seen. IMPRESSION: No evidence of traumatic intracranial injury or fracture. Electronically Signed   By: Roanna Raider M.D.   On: 11/01/2015 05:08    Other results: EKG:  Slight ST elevation II, III, avF, also seen on prior EKG in 2015.  Assessment & Plan by Problem: 55 year old male with PMH as below here with increasing generalized weakness resulting in fall and increased somnolence.  Acute encephalopathy in the setting of epilepsy:  No leukocytosis, fever or symptoms to suggest infectious etiology, specifically meningitis/encehalitis unlikely.  PCO2 slightly elevated at 47.7 and wife reports he has hx of COPD (he is on Spiriva and prn albuterol).  No acute findings on CT.  He is on several CNS active medications for seizure and mood, including Xanax (3-4x daily per wife), Celexa, Depakote and Trazodone.  There is no fever or myoclonus to  suggest serotonin syndrome.  Depakote level is slightly above therapeutic range at 103 and wife says this hypersomnolence has happened several times in the past but never last this long.  I suspect this is due to supratherapeutic Depakote and combo of other medications.   - neurology consulted and recommendations appreciated - STOP Depakote and START Keppra per neuro recs - taper benzo - hold Celexa and trazodone for now - check CO given facial flushing - check TSH given hx of hypothyroidism on replacment - NPO until he passes swallow eval - seizure precautions  Generalized weakness and falls:  Chronic problem.  No focal findings on exam.  No acute findings on CT head.  Likely medication ADR/interactions. - CNS med changes as above - fall precautions  - PT and OT evaluations  COPD:  Stable.   - continue Spiriva and prn albuterol  Essential hypertension:  Normotensive.  Wife says he is compliant with daily lisinopril .  - hold for now while he is altered with normal BP - can likely resume once he is back to baseline mental status  Diabetes:  Blood sugar ok at 106 so not contributing to encephalopathy.  Wife says he is compliant with metformin  daily. - hold metformin during admission in case he requires contrast studies - SSI-S and CBG monitoring - resume metformin at discharge  Episode of chest pain:  Wife says he complained of chest pain one day last week.  She says he takes Tums when this happens and it resolves so he was likely reporting heartburn.  Per her report, he does not have hx of MI, heart failure, TIA or stroke.  She says he had a normal cardiac cath 10 years ago but says he required defib immediately after the procedure due to abnormal rhythm.  He denies chest pain today.  EKG unchanged from prior.  Unchanged EKG, no chest pain and troponin negative making ACS unlikely.  - telemetry for now  Generalized anxiety disorder:  Holding Celexa in the setting of acute  encephalopathy. - taper Xanax  Thrombocytopenia:  plt 108 confirmed by smear.  Other counts normal. - check HIV, HCV - hold pharmacologic VTE ppx  Diet:  NPO until he passes bedside swallow, then carb mod VTE ppx:  SCDs, no pharmacologic VTE for now given Plt 108K Code:  Full code confirmed with wife.  Dispo: Disposition is deferred at this time, awaiting improvement of current medical problems. Anticipated discharge in approximately 1 day(s).   The patient does have a current PCP (Danny Mis, MD) and does not need an [facility] hospital follow-up appointment after discharge.  The patient does not have transportation limitations that hinder transportation to clinic appointments.  Signed: Yolanda Manges, DO 11/01/2015, 9:58 AM

## 2015-11-01 NOTE — ED Provider Notes (Signed)
9:45 AM Patient remains listless, but awakens to verbal command. We have discussed this case with neurology. The recommendation is for changing to Keppra, with 500 mg, twice daily dosing. For the next 3 days, the patient should have half of his typical Depakote dosing. Subsequently the patient can stop this medication. On discharge, the patient can follow-up with go for neurology.   Gerhard Munch, MD 11/01/15 [phone]

## 2015-11-01 NOTE — Progress Notes (Signed)
Ilijah Doucet is a 55 y.o. male patient admitted from ED lethargic, responds to voice.  VSS - Blood pressure 114/65, pulse 73, temperature 97.2 F (36.2 C), temperature source Oral, resp. rate 19, height  (1.702 m), weight 102.059 kg (225 lb), SpO2 99 %.    IV in place, occlusive dsg intact without redness.   Admission INP armband ID verified with patient/family, and in place.   SR up x 2, fall assessment complete, with patient. Call light within reach, patient able to voice, and demonstrate understanding.  Skin, clean-dry- intact without evidence of bruising, or skin tears.   No evidence of skin break down noted on exam. Seizure precautions initiated.      Will cont to eval and treat per MD orders.  Kendall Flack, RN 11/01/2015 6:16 PM

## 2015-11-02 DIAGNOSIS — G40219 Localization-related (focal) (partial) symptomatic 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, intractable, without status epilepticus: Secondary | ICD-10-CM | POA: Diagnosis present

## 2015-11-02 DIAGNOSIS — J449 Chronic obstructive pulmonary disease, unspecified: Secondary | ICD-10-CM

## 2015-11-02 DIAGNOSIS — G4710 Hypersomnia, unspecified: Secondary | ICD-10-CM | POA: Diagnosis present

## 2015-11-02 DIAGNOSIS — R531 Weakness: Secondary | ICD-10-CM | POA: Diagnosis not present

## 2015-11-02 DIAGNOSIS — R5383 Other fatigue: Secondary | ICD-10-CM | POA: Diagnosis present

## 2015-11-02 DIAGNOSIS — R400 Somnolence: Secondary | ICD-10-CM

## 2015-11-02 DIAGNOSIS — F1210 Cannabis abuse, uncomplicated: Secondary | ICD-10-CM

## 2015-11-02 DIAGNOSIS — E119 Type 2 diabetes mellitus without complications: Secondary | ICD-10-CM | POA: Diagnosis present

## 2015-11-02 DIAGNOSIS — F17200 Nicotine dependence, unspecified, uncomplicated: Secondary | ICD-10-CM | POA: Diagnosis present

## 2015-11-02 DIAGNOSIS — Z7984 Long term (current) use of oral hypoglycemic drugs: Secondary | ICD-10-CM

## 2015-11-02 DIAGNOSIS — R251 Tremor, unspecified: Secondary | ICD-10-CM | POA: Diagnosis not present

## 2015-11-02 DIAGNOSIS — G9340 Encephalopathy, unspecified: Secondary | ICD-10-CM | POA: Diagnosis not present

## 2015-11-02 DIAGNOSIS — D696 Thrombocytopenia, unspecified: Secondary | ICD-10-CM

## 2015-11-02 DIAGNOSIS — Z9181 History of falling: Secondary | ICD-10-CM | POA: Diagnosis not present

## 2015-11-02 DIAGNOSIS — F411 Generalized anxiety disorder: Secondary | ICD-10-CM

## 2015-11-02 DIAGNOSIS — E875 Hyperkalemia: Secondary | ICD-10-CM | POA: Diagnosis present

## 2015-11-02 DIAGNOSIS — E785 Hyperlipidemia, unspecified: Secondary | ICD-10-CM | POA: Diagnosis present

## 2015-11-02 DIAGNOSIS — I10 Essential (primary) hypertension: Secondary | ICD-10-CM | POA: Diagnosis present

## 2015-11-02 DIAGNOSIS — E039 Hypothyroidism, unspecified: Secondary | ICD-10-CM | POA: Diagnosis present

## 2015-11-02 DIAGNOSIS — F329 Major depressive disorder, single episode, unspecified: Secondary | ICD-10-CM | POA: Diagnosis present

## 2015-11-02 DIAGNOSIS — F419 Anxiety disorder, unspecified: Secondary | ICD-10-CM | POA: Diagnosis present

## 2015-11-02 DIAGNOSIS — T426X5A Adverse effect of other antiepileptic and sedative-hypnotic drugs, initial encounter: Secondary | ICD-10-CM | POA: Diagnosis present

## 2015-11-02 DIAGNOSIS — L219 Seborrheic dermatitis, unspecified: Secondary | ICD-10-CM | POA: Diagnosis present

## 2015-11-02 DIAGNOSIS — G40909 Epilepsy, unspecified, not intractable, without status epilepticus: Secondary | ICD-10-CM | POA: Diagnosis not present

## 2015-11-02 LAB — GLUCOSE, CAPILLARY
GLUCOSE-CAPILLARY: 146 mg/dL — AB (ref 65–99)
Glucose-Capillary: 69 mg/dL (ref 65–99)
Glucose-Capillary: 130 mg/dL — ABNORMAL HIGH (ref 65–99)
Glucose-Capillary: 89 mg/dL (ref 65–99)
Glucose-Capillary: 96 mg/dL (ref 65–99)
Glucose-Capillary: 100 mg/dL — ABNORMAL HIGH (ref 65–99)

## 2015-11-02 LAB — CBC
HCT: 45.2 % (ref 39.0–52.0)
Hemoglobin: 15.4 g/dL (ref 13.0–17.0)
MCH: 31.9 pg (ref 26.0–34.0)
MCHC: 34.1 g/dL (ref 30.0–36.0)
MCV: 93.6 fL (ref 78.0–100.0)
PLATELETS: 83 10*3/uL — AB (ref 150–400)
RBC: 4.83 MIL/uL (ref 4.22–5.81)
RDW: 14.1 % (ref 11.5–15.5)
WBC: 6.2 10*3/uL (ref 4.0–10.5)

## 2015-11-02 LAB — BASIC METABOLIC PANEL
ANION GAP: 10 (ref 5–15)
BUN: 16 mg/dL (ref 6–20)
CHLORIDE: 100 mmol/L — AB (ref 101–111)
CO2: 28 mmol/L (ref 22–32)
Calcium: 9.3 mg/dL (ref 8.9–10.3)
Creatinine, Ser: 0.64 mg/dL (ref 0.61–1.24)
GFR calc non Af Amer: >60 mL/min (ref >60)
Glucose, Bld: 123 mg/dL — ABNORMAL HIGH (ref 65–99)
POTASSIUM: 4 mmol/L (ref 3.5–5.1)
SODIUM: 138 mmol/L (ref 135–145)

## 2015-11-02 LAB — COMPREHENSIVE METABOLIC PANEL
ALBUMIN: 3.3 g/dL — AB (ref 3.5–5.0)
ALK PHOS: 50 U/L (ref 38–126)
ALT: 12 U/L — AB (ref 17–63)
ANION GAP: 10 (ref 5–15)
AST: 31 U/L (ref 15–41)
BUN: 19 mg/dL (ref 6–20)
CALCIUM: 9.2 mg/dL (ref 8.9–10.3)
CHLORIDE: 102 mmol/L (ref 101–111)
CO2: 26 mmol/L (ref 22–32)
Creatinine, Ser: 0.67 mg/dL (ref 0.61–1.24)
GFR calc Af Amer: >60 mL/min (ref >60)
GFR calc non Af Amer: >60 mL/min (ref >60)
GLUCOSE: 100 mg/dL — AB (ref 65–99)
Potassium: 6.3 mmol/L (ref 3.5–5.1)
SODIUM: 138 mmol/L (ref 135–145)
Total Bilirubin: 1.3 mg/dL — ABNORMAL HIGH (ref 0.3–1.2)
Total Protein: 7.1 g/dL (ref 6.5–8.1)

## 2015-11-02 LAB — HEPATITIS C ANTIBODY (REFLEX): HCV Ab: >11 s/co ratio — ABNORMAL HIGH (ref 0.0–0.9)

## 2015-11-02 LAB — HIV ANTIBODY (ROUTINE TESTING W REFLEX): HIV SCREEN 4TH GENERATION: NONREACTIVE

## 2015-11-02 LAB — COMMENT2 - HEP PANEL

## 2015-11-02 MED ORDER — ALPRAZOLAM 0.5 MG PO TABS
1.5000 mg | ORAL_TABLET | Freq: Four times a day (QID) | ORAL | Status: DC
  Administered 2015-11-02: 1.5 mg via ORAL
  Filled 2015-11-02: qty 3

## 2015-11-02 MED ORDER — SODIUM POLYSTYRENE SULFONATE 15 GM/60ML PO SUSP
15.0000 g | Freq: Once | ORAL | Status: DC
  Filled 2015-11-02: qty 60

## 2015-11-02 MED ORDER — ACETAMINOPHEN 325 MG PO TABS
650.0000 mg | ORAL_TABLET | Freq: Once | ORAL | Status: AC
  Administered 2015-11-02: 650 mg via ORAL
  Filled 2015-11-02: qty 2

## 2015-11-02 MED ORDER — DEXTROSE 50 % IV SOLN
25.0000 mL | Freq: Once | INTRAVENOUS | Status: AC
  Administered 2015-11-02: 25 mL via INTRAVENOUS

## 2015-11-02 MED ORDER — DEXTROSE 50 % IV SOLN
INTRAVENOUS | Status: AC
Start: 2015-11-02 — End: 2015-11-02
  Filled 2015-11-02: qty 50

## 2015-11-02 MED ORDER — SODIUM CHLORIDE 0.9 % IV BOLUS (SEPSIS): 500.0000 mL | Freq: Once | INTRAVENOUS | Status: DC

## 2015-11-02 MED ORDER — ALPRAZOLAM 0.5 MG PO TABS
1.5000 mg | ORAL_TABLET | Freq: Two times a day (BID) | ORAL | Status: DC
  Administered 2015-11-02 – 2015-11-03 (×2): 1.5 mg via ORAL
  Filled 2015-11-02 (×2): qty 3

## 2015-11-02 NOTE — Evaluation (Signed)
Clinical/Bedside Swallow Evaluation Patient Details  Name: [name] MRN: [id number] Date of Birth: [birth year]  Today's Date: 11/02/2015 Time: SLP Start Time (ACUTE ONLY): 7829 SLP Stop Time (ACUTE ONLY): 0826 SLP Time Calculation (min) (ACUTE ONLY): 15 min  Past Medical History:  Past Medical History  Diagnosis Date  . Seizures (HCC)   . Hypertension   . Emphysema of lung (HCC)   . Depression   . Diabetes mellitus without complication (HCC)   . Thyroid disease   . Hyperlipidemia   . Hiatal hernia   . Anemia   . GERD (gastroesophageal reflux disease)   . Memory loss    Past Surgical History:  Past Surgical History  Procedure Laterality Date  . Tonsillectomy and adenoidectomy    . Dental surgery    . Eye surgery    . Appendectomy     HPI:  [name] is a 55 year old male with PMH of seizure disorder, COPD, GERD, hiatal hernia,chronic headaches, HTN, DM, anxiety and depression here with generalized weakness, hypersomnolence and recent falls.CT head - no acute abnormalities.    Assessment / Plan / Recommendation Clinical Impression  No evidence of dysphagia and no s/s of penetration/aspiration observed. Pt endentulous, however presented with no issue masticating. Swallow was timely and efficient. Pt educated re: diet recommendation and aspiration precautions. Recommend regular diet, thin liquids, meds whole in puree. No SLP intervention warranted at this time.    Aspiration Risk  Mild aspiration risk    Diet Recommendation Regular;Thin liquid   Liquid Administration via: Cup;Straw Medication Administration: Whole meds with puree Supervision: Patient able to self feed;Staff to assist with self feeding Compensations: Minimize environmental distractions;Small sips/bites;Slow rate Postural Changes: Seated upright at 90 degrees    Other  Recommendations Oral Care Recommendations: Oral care BID   Follow up Recommendations  None           Prognosis Prognosis for  Safe Diet Advancement: Good      Swallow Study   General HPI: [name] is a 55 year old male with PMH of seizure disorder, COPD, GERD, hiatal hernia,chronic headaches, HTN, DM, anxiety and depression here with generalized weakness, hypersomnolence and recent falls.CT head - no acute abnormalities.  Type of Study: Bedside Swallow Evaluation Previous Swallow Assessment: none found Diet Prior to this Study: NPO Temperature Spikes Noted: No Respiratory Status: Nasal cannula History of Recent Intubation: No Behavior/Cognition: Alert;Cooperative;Requires cueing Oral Cavity Assessment: Within Functional Limits Oral Care Completed by SLP: No Oral Cavity - Dentition: Edentulous Vision: Impaired for self-feeding Self-Feeding Abilities: Able to feed self;Needs assist Patient Positioning: Upright in bed Baseline Vocal Quality: Normal;Other (comment) (gravelly) Volitional Cough: Strong Volitional Swallow: Able to elicit    Oral/Motor/Sensory Function Overall Oral Motor/Sensory Function: Within functional limits   Ice Chips Ice chips: Not tested   Thin Liquid Thin Liquid: Within functional limits Presentation: Cup;Self Fed;Straw    Nectar Thick Nectar Thick Liquid: Not tested   Honey Thick Honey Thick Liquid: Not tested   Puree Puree: Within functional limits Presentation: Self Fed;Spoon   Solid   GO   Solid: Within functional limits Presentation: Self Fed    Functional Assessment Tool Used: skilled clinical judgement Functional Limitations: Swallowing Swallow Current Status (F6213): 0 percent impaired, limited or restricted Swallow Goal Status (Y8657): 0 percent impaired, limited or restricted Swallow Discharge Status [phone]): 0 percent impaired, limited or restricted   Lynita Lombard 11/02/2015,9:06 AM   Lynita Lombard, Student-SLP

## 2015-11-02 NOTE — Progress Notes (Addendum)
Hypoglycemic Event  CBG: 69  Treatment: D50 IV 25 mL  Symptoms: Pale  Follow-up CBG: Time: 0540 CBG Result: 130  Possible Reasons for Event: Inadequate meal intake  Comments/MD notified: Information passed along to day shift nurse, will monitor pt.     Cresenciano Lick M

## 2015-11-02 NOTE — Progress Notes (Signed)
Subjective: [name] is doing better this morning, he is more alert and talkative. He has no complaints. States he has taken Keppra in the past and it made him "sick."  HCV antibody screen at admission positive. Objective: Vital signs in last 24 hours: Filed Vitals:   11/02/15 0000 11/02/15 0500 11/02/15 0538 11/02/15 0910  BP:   146/64   Pulse:   58   Temp:   97.9 F (36.6 C)   TempSrc:   Oral   Resp:   18   Height:      Weight: 225 lb 5 oz (102.2 kg) 227 lb 1.2 oz (103 kg)    SpO2:   98% 92%   Weight change:   Intake/Output Summary (Last 24 hours) at 11/02/15 0953 Last data filed at 11/02/15 0900  Gross per 24 hour  Intake 1305.42 ml  Output      0 ml  Net 1305.42 ml   General: sitting up in bed, comfortable appearing, no distress, mentating well HEENT: EOMI, no scleral icterus Cardiac: RRR, no rubs, murmurs or gallops Pulm: normal work of breathing Abd: soft, nontender, nondistended Ext: warm and well perfused, no pedal edema Neuro: alert and oriented, no focal deificts, follows commands, resting and essential tremor, possible cogwheel rigidity Skin: seborrheic dermatitis present on face and abdomen  Lab Results: Basic Metabolic Panel:  Recent Labs Lab 11/01/15 0432 11/02/15 0749  NA 138 138  K 3.8 6.3*  CL 100* 102  CO2 28 26  GLUCOSE 106* 100*  BUN 24* 19  CREATININE 1.00 0.67  CALCIUM 9.1 9.2   Liver Function Tests:  Recent Labs Lab 11/01/15 0432 11/02/15 0749  AST 17 31  ALT 12* 12*  ALKPHOS 50 50  BILITOT 0.5 1.3*  PROT 6.9 7.1  ALBUMIN 3.3* 3.3*   No results for input(s): LIPASE, AMYLASE in the last 168 hours.  Recent Labs Lab 11/01/15 0757  AMMONIA 28   CBC:  Recent Labs Lab 11/01/15 0432 11/02/15 0455  WBC 6.2 6.2  NEUTROABS 2.3  --   HGB 14.3 15.4  HCT 42.4 45.2  MCV 94.2 93.6  PLT 108* 83*   Cardiac Enzymes:  Recent Labs Lab 11/01/15 1206  TROPONINI <0.03   BNP: No results for input(s): PROBNP in the last  168 hours. D-Dimer: No results for input(s): DDIMER in the last 168 hours. CBG:  Recent Labs Lab 11/01/15 1802 11/01/15 2113 11/01/15 2130 11/02/15 0435 11/02/15 0541 11/02/15 0811  GLUCAP 94 92 92 69 130* 89   Hemoglobin A1C: No results for input(s): HGBA1C in the last 168 hours. Fasting Lipid Panel: No results for input(s): CHOL, HDL, LDLCALC, TRIG, CHOLHDL, LDLDIRECT in the last 168 hours. Thyroid Function Tests:  Recent Labs Lab 11/01/15 1206  TSH 3.110   Coagulation: No results for input(s): LABPROT, INR in the last 168 hours. Anemia Panel: No results for input(s): VITAMINB12, FOLATE, FERRITIN, TIBC, IRON, RETICCTPCT in the last 168 hours. Urine Drug Screen: Drugs of Abuse     Component Value Date/Time   LABOPIA NONE DETECTED 11/01/2015 0537   COCAINSCRNUR NONE DETECTED 11/01/2015 0537   LABBENZ POSITIVE* 11/01/2015 0537   AMPHETMU NONE DETECTED 11/01/2015 0537   THCU POSITIVE* 11/01/2015 0537   LABBARB NONE DETECTED 11/01/2015 0537    Alcohol Level:  Recent Labs Lab 11/01/15 0432  ETH <5   Urinalysis:  Recent Labs Lab 11/01/15 0537  COLORURINE AMBER*  LABSPEC 1.025  PHURINE 7.0  GLUCOSEU NEGATIVE  HGBUR NEGATIVE  BILIRUBINUR  SMALL*  KETONESUR 15*  PROTEINUR NEGATIVE  NITRITE NEGATIVE  LEUKOCYTESUR NEGATIVE   Misc. Labs:   Micro Results: No results found for this or any previous visit (from the past 240 hour(s)). Studies/Results: Ct Head Wo Contrast  11/01/2015  CLINICAL DATA:  Acute onset of lethargy and generalized weakness. Status post fall in bathroom. Incontinence. Initial encounter. EXAM: CT HEAD WITHOUT CONTRAST TECHNIQUE: Contiguous axial images were obtained from the base of the skull through the vertex without intravenous contrast. COMPARISON:  CT of the head performed 03/03/2014 FINDINGS: There is no evidence of acute infarction, mass lesion, or intra- or extra-axial hemorrhage on CT. The posterior fossa, including the cerebellum,  brainstem and fourth ventricle, is within normal limits. The third and lateral ventricles, and basal ganglia are unremarkable in appearance. The cerebral hemispheres are symmetric in appearance, with normal gray-white differentiation. No mass effect or midline shift is seen. There is no evidence of fracture; visualized osseous structures are unremarkable in appearance. The orbits are within normal limits. The paranasal sinuses and mastoid air cells are well-aerated. No significant soft tissue abnormalities are seen. IMPRESSION: No evidence of traumatic intracranial injury or fracture. Electronically Signed   By: Roanna Raider M.D.   On: 11/01/2015 05:08   Medications: I have reviewed the patient's current medications. Scheduled Meds: . ALPRAZolam  1.5 mg Oral QID  . antiseptic oral rinse  7 mL Mouth Rinse BID  . insulin aspart  0-9 Units Subcutaneous TID WC  . levETIRAcetam  500 mg Intravenous Q12H  . levothyroxine  150 mcg Oral QAC breakfast  . sodium chloride  500 mL Intravenous Once  . sodium chloride flush  3 mL Intravenous Q12H  . sodium chloride flush  3 mL Intravenous Q12H  . sodium polystyrene  15 g Oral Once  . tiotropium  18 mcg Inhalation Daily   Continuous Infusions:  PRN Meds:. Assessment/Plan: Principal Problem:   Acute encephalopathy Active Problems:   Essential hypertension, benign   Diabetes (HCC)   Generalized anxiety disorder   Hypothyroidism   Localization-related symptomatic 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, intractable, without status epilepticus (HCC)   Chronic daily headache  Hyperkalemia: morning labs returned a critical value of 6.3 - stat EKG, repeat BMP - bolus - Kayexalate 15g once - repeat BMP this afternoon  Acute Encephalopathy in the Setting of Epilepsy: patient improved today and is more alert.  Depakote was stopped yesterday at the recommendation of neurology and patient started on Keppra.  Per wife,  hypersomnolence has happened many times in the past but just has not lasted this long.  Suspect this was due to combination of multiple CNS acting medications and supratherapeutic Depakote.  Due to resistant seborrheic dermatitis and resting tremor, should consider Parkinsons in the differential and will recommend evaluation as an outpatient.  Could also be related to benzodiazepine withdrawal - appreciate neurology recs - continue Keppra - taper alprazolam from 2mg  QID to 1.5 TID x 3 days then 1mg  TID x 3 days and so forth until off - hold celexa and trazodone for now.  Stop trazodone at discharge - seizure precautions - swallow study complete - TSH normal  Generalized Weakness and Falls: Chronic problem. No focal findings on exam. No acute findings on CT head. Likely medication ADR/interactions. - CNS med changes as above - fall precautions  - PT and OT evaluations pending  COPD: Stable.  - continue Spiriva and prn albuterol  Essential Hypertension: Normotensive. Wife says he is compliant  with daily lisinopril .  - hold for now while he was altered with normal BP and elevated potassium - can likely resume once he is back to baseline mental status  Diabetes: Blood sugar a little low at 69 yesterday and given amp of D-50 but was NPO. Wife says he is compliant with metformin  daily. - SSI-S and CBG monitoring - resume metformin at discharge  Generalized Anxiety Disorder: Holding Celexa in the setting of acute encephalopathy. - taper Xanax as above  Thrombocytopenia: Platelet count 108 at admission down to 83 this morning confirmed by smear. Other counts normal. - HIV negative - HCV antibody positive, will check viral load with reflex to genotype - hold pharmacologic DVT PPx  Diet: Carb modified  DVT PPx: SCDs  Code: Full code confirmed with wife.  Dispo: Disposition is deferred at this time, awaiting improvement of current medical problems.   Anticipated discharge in approximately 0-1 day(s).   The patient does have a current PCP (Macy Mis, MD) and does not need an [facility] hospital follow-up appointment after discharge.  The patient does not have transportation limitations that hinder transportation to clinic appointments.  .Services Needed at time of discharge: Y = Yes, Blank = No PT:   OT:   RN:   Equipment:   Other:       Gwynn Burly, DO 11/02/2015, 9:53 AM

## 2015-11-02 NOTE — Care Management Note (Addendum)
Case Management Note  Patient Details  Name: Marrio Scribner MRN: [id number] Date of Birth: 1960-10-26  Subjective/Objective:                 Spoke with patient and wife at the bedside. They request DME rolator and shower sheet. Referral made to [facility] OT rec SNF at [place]. Medicare in OBS will not qualify for sklilled care; if SNF patient would be self pay. Spoke with wife about HH options, chose [facility], will continue to follow for dispo plan. No other CM needs identified.   Action/Plan:  Will follow for [facility] needs. Patient would like [facility] for [facility] if needed.   Expected Discharge Date:                  Expected Discharge Plan:  Home w Home Health Services  In-House Referral:     Discharge planning Services  CM Consult  Post Acute Care Choice:  Home Health Choice offered to:  Spouse  DME Arranged:    DME Agency:     HH Arranged:    HH Agency:     Status of Service:  In process, will continue to follow  Medicare Important Message Given:    Date Medicare IM Given:    Medicare IM give by:    Date Additional Medicare IM Given:    Additional Medicare Important Message give by:     If discussed at Long Length of Stay Meetings, dates discussed:    Additional Comments:  Lawerance Sabal, RN 11/02/2015, 3:21 PM

## 2015-11-02 NOTE — Care Management Obs Status (Signed)
MEDICARE OBSERVATION STATUS NOTIFICATION   Patient Details  Name: Noor Vidales MRN: [id number] Date of Birth: November 06, 1960   Medicare Observation Status Notification Given:  Yes    Lawerance Sabal, RN 11/02/2015, 3:25 PM

## 2015-11-02 NOTE — Progress Notes (Signed)
Dr. Earlene Plater made aware K level now 4.0. Verbal order to hold kayexalate , bolus and 12 lead.

## 2015-11-02 NOTE — Progress Notes (Addendum)
CRITICAL VALUE ALERT  Critical value received:  K 6.3 Date of notification:  11/02/2015  Time of notification:  9:09 AM  Critical value read back:Yes.    Nurse who received alert:  Billee Cashing  MD made aware via telephone at  9:09 AM and stated they will place orders

## 2015-11-02 NOTE — Progress Notes (Signed)
Occupational Therapy Evaluation Patient Details Name: [name] MRN: [id number] DOB: Jul 15, 1961 Today's Date: 11/02/2015    History of Present Illness 55 year old male with PMH of seizure disorder, chronic headaches, HTN, DM, anxiety and depression here with generalized weakness and hypersomnolence.   Clinical Impression   Patient presents to OT with decreased ADL independence and safety due to the deficits listed below. Patient will benefit from skilled OT to maximize function and facilitate a safe discharge. OT will follow.    Follow Up Recommendations  SNF;Supervision/Assistance - 24 hour (if patient does not meet criteria for SNF, recommend HHOT and HH aide)   Equipment Recommendations       Recommendations for Other Services       Precautions / Restrictions Precautions Precautions: Fall;Other (comment) (seizure) Precaution Comments: chart indicates patient falls 3x/month; a fall in bathroom prompted this admission Restrictions Weight Bearing Restrictions: No      Mobility Bed Mobility Overal bed mobility: Needs Assistance Bed Mobility: Supine to Sit     Supine to sit: Min assist        Transfers Overall transfer level: Needs assistance Equipment used: Rolling walker (2 wheeled) Transfers: Sit to/from Stand Sit to Stand: Mod assist;Min assist;+2 safety/equipment         General transfer comment: min A from bed; mod A from low toilet    Balance                                            ADL Overall ADL's : Needs assistance/impaired Eating/Feeding: Set up;Bed level   Grooming: Wash/dry hands;Minimal assistance               Lower Body Dressing: Minimal assistance;Bed level (don L slipper which had fallen off)   Toilet Transfer: Moderate assistance;Ambulation;Regular Toilet;RW;Grab bars   Toileting- Clothing Manipulation and Hygiene: Minimal assistance;Sitting/lateral lean       Functional mobility during ADLs: Minimal  assistance;Moderate assistance;Rolling walker General ADL Comments: Patient lethargic, but awake enough to participate. Patient agreeable to OT/PT session. Patient participated in bed mobility, don L slipper, ambulation to bathroom, ambulation in hallway, grooming, and was left up in chair with chair alarm at end of session. He gets walker too far out in front of him and leans posteriorly. High fall risk. Also has B UE tremors. Patient is poor historian. OT will follow.     Vision Additional Comments: needed help navigating environment   Perception     Praxis      Pertinent Vitals/Pain Pain Assessment: No/denies pain     Hand Dominance Left   Extremity/Trunk Assessment Upper Extremity Assessment Upper Extremity Assessment: Generalized weakness;RUE deficits/detail;LUE deficits/detail RUE Deficits / Details: tremors noted LUE Deficits / Details: tremors noted   Lower Extremity Assessment Lower Extremity Assessment: Defer to PT evaluation   Cervical / Trunk Assessment Cervical / Trunk Assessment: Kyphotic   Communication Communication Communication: No difficulties   Cognition Arousal/Alertness: Lethargic Behavior During Therapy: Flat affect Overall Cognitive Status: No family/caregiver present to determine baseline cognitive functioning       Memory: Decreased recall of precautions;Decreased short-term memory             General Comments       Exercises       Shoulder Instructions      Home Living Family/patient expects to be discharged to:: Private residence Living Arrangements: Spouse/significant other  Available Help at Discharge: Family;Available 24 hours/day Type of Home: House Home Access: Stairs to enter [employer] of Steps: 4 Entrance Stairs-Rails: Right Home Layout: One level     Bathroom Shower/Tub: Chief Strategy Officer: Standard     Home Equipment: Cane - single point;Walker - standard;Bedside commode   Additional  Comments: patient not a good historian and this information is from patient      Prior Functioning/Environment Level of Independence: Independent with assistive device(s)        Comments: used cane at home per patient    OT Diagnosis: Generalized weakness;Cognitive deficits;Altered mental status   OT Problem List: Decreased strength;Decreased activity tolerance;Impaired balance (sitting and/or standing);Impaired vision/perception;Decreased cognition;Decreased safety awareness;Decreased knowledge of precautions   OT Treatment/Interventions: Self-care/ADL training;DME and/or AE instruction;Therapeutic activities;Patient/family education    OT Goals(Current goals can be found in the care plan section) Acute Rehab OT Goals Patient Stated Goal: none stated OT Goal Formulation: With patient Time For Goal Achievement: 11/16/15 Potential to Achieve Goals: Good ADL Goals Pt Will Perform Upper Body Bathing: with supervision;sitting Pt Will Perform Lower Body Bathing: with supervision;sit to/from stand Pt Will Perform Upper Body Dressing: with supervision;sitting Pt Will Perform Lower Body Dressing: with supervision;sit to/from stand Pt Will Transfer to Toilet: with supervision;ambulating;regular height toilet;bedside commode Pt Will Perform Toileting - Clothing Manipulation and hygiene: with supervision;sit to/from stand  OT Frequency: Min 2X/week   Barriers to D/C:            Co-evaluation PT/OT/SLP Co-Evaluation/Treatment: Yes Reason for Co-Treatment: Complexity of the patient's impairments (multi-system involvement);Necessary to address cognition/behavior during functional activity;For patient/therapist safety PT goals addressed during session: Mobility/safety with mobility OT goals addressed during session: ADL's and self-care      End of Session Equipment Utilized During Treatment: Rolling walker Nurse Communication: Mobility status  Activity Tolerance: Patient tolerated  treatment well Patient left: in chair;with call bell/phone within reach;with chair alarm set;with nursing/sitter in room   Time: 1610-9604 OT Time Calculation (min): 35 min Charges:  OT General Charges $OT Visit: 1 Procedure OT Evaluation $OT Eval Moderate Complexity: 1 Procedure G-Codes: OT G-codes **NOT FOR INPATIENT CLASS** Functional Assessment Tool Used: clinical judgment Functional Limitation: Self care Self Care Current Status (V4098): At least 40 percent but less than 60 percent impaired, limited or restricted Self Care Goal Status (J1914): At least 1 percent but less than 20 percent impaired, limited or restricted  Arraya Buck A 11/02/2015, 1:19 PM

## 2015-11-02 NOTE — Evaluation (Signed)
Physical Therapy Evaluation Patient Details Name: [name] MRN: [id number] DOB: [birth year] Today's Date: 11/02/2015   History of Present Illness  55 year old male with PMH of seizure disorder, chronic headaches, HTN, DM, anxiety and depression here with generalized weakness and hypersomnolence.  Clinical Impression  Pt admitted with/for general weakness, hypersomnolence and AMS.  Pt currently limited functionally due to the problems listed. ( See problems list.)   Pt will benefit from PT to maximize function and safety in order to get ready for next venue listed below.     Follow Up Recommendations SNF    Equipment Recommendations  Other (comment) (?RW is slow to progress)    Recommendations for Other Services       Precautions / Restrictions Precautions Precautions: Fall;Other (comment) Precaution Comments: chart indicates patient falls 3x/month; a fall in bathroom prompted this admission Restrictions Weight Bearing Restrictions: No      Mobility  Bed Mobility Overal bed mobility: Needs Assistance Bed Mobility: Supine to Sit     Supine to sit: Min assist        Transfers Overall transfer level: Needs assistance Equipment used: Rolling walker (2 wheeled) Transfers: Sit to/from Stand Sit to Stand: Mod assist;Min assist;+2 safety/equipment         General transfer comment: min A from bed; mod A from low toilet  Ambulation/Gait Ambulation/Gait assistance: Min assist Ambulation Distance (Feet): 15 Feet (to bathroom, then additional 100' with RW) Assistive device: Rolling walker (2 wheeled) Gait Pattern/deviations: Step-through pattern;Shuffle Gait velocity: slower Gait velocity interpretation: Below normal speed for age/gender General Gait Details: slow, shuffle steps becoming a little more unsteady with fatigue.  Stairs            Wheelchair Mobility    Modified Rankin (Stroke Patients Only)       Balance Overall balance assessment: Needs  assistance   Sitting balance-Leahy Scale: Fair     Standing balance support: Bilateral upper extremity supported;Single extremity supported Standing balance-Leahy Scale: Poor Standing balance comment: reliant on AD                             Pertinent Vitals/Pain Pain Assessment: No/denies pain    Home Living Family/patient expects to be discharged to:: Private residence Living Arrangements: Spouse/significant other Available Help at Discharge: Family;Available 24 hours/day Type of Home: House Home Access: Stairs to enter Entrance Stairs-Rails: Right Entrance Stairs-Number of Steps: 4 Home Layout: One level Home Equipment: Cane - single point;Walker - standard;Bedside commode Additional Comments: patient not a good historian and this information is from patient    Prior Function Level of Independence: Independent with assistive device(s)         Comments: used cane at home per patient     Hand Dominance   Dominant Hand: Left    Extremity/Trunk Assessment   Upper Extremity Assessment: Defer to OT evaluation RUE Deficits / Details: tremors noted     LUE Deficits / Details: tremors noted   Lower Extremity Assessment: Overall WFL for tasks assessed;Generalized weakness      Cervical / Trunk Assessment: Kyphotic  Communication   Communication: No difficulties  Cognition Arousal/Alertness: Lethargic Behavior During Therapy: Flat affect Overall Cognitive Status: No family/caregiver present to determine baseline cognitive functioning       Memory: Decreased recall of precautions;Decreased short-term memory              General Comments      Exercises  Assessment/Plan    PT Assessment Patient needs continued PT services  PT Diagnosis Abnormality of gait;Generalized weakness   PT Problem List Decreased strength;Decreased activity tolerance;Decreased balance;Decreased mobility;Decreased knowledge of use of DME  PT Treatment  Interventions Gait training;DME instruction;Stair training;Functional mobility training;Therapeutic activities;Balance training;Patient/family education   PT Goals (Current goals can be found in the Care Plan section) Acute Rehab PT Goals Patient Stated Goal: none stated PT Goal Formulation: Patient unable to participate in goal setting Time For Goal Achievement: 11/16/15 Potential to Achieve Goals: Good    Frequency Min 3X/week   Barriers to discharge        Co-evaluation PT/OT/SLP Co-Evaluation/Treatment: Yes Reason for Co-Treatment: Complexity of the patient's impairments (multi-system involvement);Necessary to address cognition/behavior during functional activity PT goals addressed during session: Mobility/safety with mobility OT goals addressed during session: ADL's and self-care       End of Session   Activity Tolerance: Patient tolerated treatment well Patient left: in chair;with call bell/phone within reach;with chair alarm set Nurse Communication: Mobility status         Time: 1610-9604 PT Time Calculation (min) (ACUTE ONLY): 35 min   Charges:   PT Evaluation $PT Eval Moderate Complexity: 1 Procedure     PT G Codes:        Elena Davia, Eliseo Gum 11/02/2015, 4:12 PM  11/02/2015  [place] Bing, PT [phone]  (pager)

## 2015-11-03 DIAGNOSIS — G9340 Encephalopathy, unspecified: Secondary | ICD-10-CM

## 2015-11-03 LAB — CBC
HEMATOCRIT: 39.1 % (ref 39.0–52.0)
Hemoglobin: 13.1 g/dL (ref 13.0–17.0)
MCH: 30.8 pg (ref 26.0–34.0)
MCHC: 33.5 g/dL (ref 30.0–36.0)
MCV: 92 fL (ref 78.0–100.0)
Platelets: 118 10*3/uL — ABNORMAL LOW (ref 150–400)
RBC: 4.25 MIL/uL (ref 4.22–5.81)
RDW: 14 % (ref 11.5–15.5)
WBC: 5 10*3/uL (ref 4.0–10.5)

## 2015-11-03 LAB — GLUCOSE, CAPILLARY: GLUCOSE-CAPILLARY: 111 mg/dL — AB (ref 65–99)

## 2015-11-03 LAB — VITAMIN B12: Vitamin B-12: 1113 pg/mL — ABNORMAL HIGH (ref 180–914)

## 2015-11-03 MED ORDER — LEVETIRACETAM 500 MG PO TABS: 500.0000 mg | ORAL_TABLET | Freq: Two times a day (BID) | ORAL | Status: DC

## 2015-11-03 MED ORDER — ALPRAZOLAM 2 MG PO TABS: 1.0000 mg | ORAL_TABLET | Freq: Two times a day (BID) | ORAL | Status: DC

## 2015-11-03 NOTE — Discharge Instructions (Signed)
Please go to your appointments scheduled for you.  If you are not able to make these appointments, please call their offices and reschedule.  We have stopped your Depakote and started you on Keppra for your seizures.  This medication was sent to your pharmacy.  We have stopped your Trazodone.  Slowly begin to taper your Xanax until you are no longer taking it.  For example, if you were taking  four times daily, start taking 1 mg twice daily for 4 days, then 0.5mg  twice daily for 4 days, then 0.25mg  twice daily for 4 days and so forth until off.

## 2015-11-03 NOTE — Care Management Note (Signed)
Case Management Note  Patient Details  Name: Vedant Shehadeh MRN: [id number] Date of Birth: 05-Dec-1960                    Subjective/Objective: Spoke with patient and wife at the bedside. They request DME rolator and shower sheet. Referral made to [facility]. OT rec SNF at [place]. Medicare in OBS will not qualify for sklilled care; if SNF patient would be self pay. Spoke with wife about HH options, chose [facility], will continue to follow for dispo plan. No other CM needs identified.   Action/Plan:  Will follow for [facility] needs. Patient would like [facility] for [facility] if needed. HH set up with [facility]      Expected Discharge Date:                  Expected Discharge Plan:  Home w Home Health Services  In-House Referral:     Discharge planning Services  CM Consult  Post Acute Care Choice:  Home Health Choice offered to:  Spouse  DME Arranged:  Walker rolling with seat, Shower stool DME Agency:  [facility]  HH Arranged:  RN, PT, OT, Social Work, Nurse's Aide HH Agency:  [facility]  Status of Service:  Completed, signed off  Medicare Important Message Given:    Date Medicare IM Given:    Medicare IM give by:    Date Additional Medicare IM Given:    Additional Medicare Important Message give by:     If discussed at Long Length of Stay Meetings, dates discussed:    Additional Comments:  Lawerance Sabal, RN 11/03/2015, 11:32 AM

## 2015-11-03 NOTE — Discharge Summary (Signed)
Name: [name] MRN: [id number] DOB: [birth year] 55 y.o. PCP: Macy Mis, MD  Date of Admission: 11/01/2015  3:45 AM Date of Discharge: 11/03/2015 Attending Physician: Gardiner Barefoot, MD  Discharge Diagnosis:  Principal Problem:   Acute encephalopathy Active Problems:   Essential hypertension, benign   Diabetes (HCC)   Generalized anxiety disorder   Hypothyroidism   Localization-related symptomatic 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, intractable, without status epilepticus (HCC)   Chronic daily headache  Discharge Medications:   Medication List    STOP taking these medications        BC HEADACHE POWDER PO     divalproex 250 MG 24 hr tablet  Commonly known as:  DEPAKOTE ER     traZODone 100 MG tablet  Commonly known as:  DESYREL      TAKE these medications        albuterol 108 (90 Base) MCG/ACT inhaler  Commonly known as:  PROVENTIL HFA;VENTOLIN HFA  Inhale 2 puffs into the lungs every 4 (four) hours as needed for wheezing or shortness of breath.     alprazolam 2 MG tablet  Commonly known as:  XANAX  Take 0.5 tablets (1 mg total) by mouth 2 (two) times daily.     atorvastatin 40 MG tablet  Commonly known as:  LIPITOR  Take 40 mg by mouth daily.     citalopram 20 MG tablet  Commonly known as:  CELEXA  Take 20 mg by mouth daily.     ketoconazole 2 % cream  Commonly known as:  NIZORAL  Apply 1 application topically daily.     levETIRAcetam 500 MG tablet  Commonly known as:  KEPPRA  Take 1 tablet (500 mg total) by mouth 2 (two) times daily.     levothyroxine 150 MCG tablet  Commonly known as:  SYNTHROID, LEVOTHROID  Take 150 mcg by mouth daily before breakfast.     lisinopril 5 MG tablet  Commonly known as:  PRINIVIL,ZESTRIL  Take 5 mg by mouth daily.     metFORMIN 500 MG 24 hr tablet  Commonly known as:  GLUCOPHAGE-XR  Take 500 mg by mouth daily with breakfast.     omeprazole 20 MG capsule  Commonly known as:   PRILOSEC  Take 20 mg by mouth daily as needed (acid reflux).     tiotropium 18 MCG inhalation capsule  Commonly known as:  SPIRIVA  Place 18 mcg into inhaler and inhale daily.        Disposition and follow-up:   [name] was discharged from [facility] in Stable condition.    1.  At the hospital follow up visit please address:  - adherence and tolerance of Keppra - tapering of Xanax, follow up with psychiatry - neurology follow up for consideration of parkinson's disease - possible ID referral for hepatitis C referral    2.  Labs / imaging needed at time of follow-up: CBC to monitor platelets  3.  Pending labs/ test needing follow-up: Hep C viral load and genotype  Follow-up Appointments: Follow-up Information    Follow up with Delbert Harness, MD On 11/11/2015.   Specialty:  Family Medicine   Why:  appointment time changed to 10:30   Contact information:   [address] [place] [address] [phone]       Follow up with [facility], MD. Nyra Capes on 11/16/2015.   Specialty:  Neurology   Why:  appointment time 2pm, please arrive at  130pm   Contact information:   [address] [place] [address] [phone]       Discharge Instructions:   Consultations:    Procedures Performed:  Ct Head Wo Contrast  11/01/2015  CLINICAL DATA:  Acute onset of lethargy and generalized weakness. Status post fall in bathroom. Incontinence. Initial encounter. EXAM: CT HEAD WITHOUT CONTRAST TECHNIQUE: Contiguous axial images were obtained from the base of the skull through the vertex without intravenous contrast. COMPARISON:  CT of the head performed 03/03/2014 FINDINGS: There is no evidence of acute infarction, mass lesion, or intra- or extra-axial hemorrhage on CT. The posterior fossa, including the cerebellum, brainstem and fourth ventricle, is within normal limits. The third and lateral ventricles, and basal ganglia are  unremarkable in appearance. The cerebral hemispheres are symmetric in appearance, with normal Danny-white differentiation. No mass effect or midline shift is seen. There is no evidence of fracture; visualized osseous structures are unremarkable in appearance. The orbits are within normal limits. The paranasal sinuses and mastoid air cells are well-aerated. No significant soft tissue abnormalities are seen. IMPRESSION: No evidence of traumatic intracranial injury or fracture. Electronically Signed   By: Roanna Raider M.D.   On: 11/01/2015 05:08    2D Echo:  none  Cardiac Cath:  none  Admission HPI:  [name] is a 55 year old male with PMH of seizure disorder, chronic headaches, HTN, DM, anxiety and depression here with generalized weakness and hypersomnolence. Hx is obtained from his wife because of his somnolence. Wife reports he has generalized weakness and intermittent confusion since seizure d/o diagnosed in 2004. She says for the past two weeks he has seemed weaker than usual. Sometimes he will become confused and think she is his sister, but this is not new. She says he falls about 3 x per month because of weakness. He fell two weeks ago and hit the back of his head on the wooden dining room table leg. He did not loose consciousness, have bruising or change in behavior at that time. He fell again last night while she was helping him in the bathroom. She says he often needs help getting to and from the bathroom at night due to confusion and she feels his meds make him pretty drowsy. He ended up falling after slipping on floor (she thinks urine got on the floor) and landed on his bottom. He did not hit his head. She says she attempted to lift him for about two hours but was unable to so she called 911.   Wife says he has not complained of chills or had decreased appetite, N/V, diarrhea, dark or blood stools, focal weakness or dyspnea. He has not had recent seizure activity. He has  COPD but no dyspnea. He complained to his wife about chest pain last week. He occasionally smokes THC but she says he does not use other illicit substances or EtOH. He quit smoking 4 years ago but will occasionally have a cigarette with friends. She has no reason to think he would have taken any other substances or overdosed on any medications. There have been no new med changes. He takes Depakote 500mg  BID, Xanax 2mg  3-4x daily (every day), Celexa 20mg  daily and Trazodone 100mg  daily. Zonegran was supposed to be added to his regimen in June 2016 but she says the pharmacy never received the rx and she did not hear back from neuro office when she tried to call. He has a walker at home that she has  to encourage him to use. She feels he is more confused at night and likens it to "sundowning." She says this nighttime confusion has been present for about 6 months but she has not mentioned it to his neurologist.   In the ED: VSS, he is asleep, arouses to voice but then drifts off again. CT head - no acute abnormalities. Neuro consulted and IMTS asked to admit.  Hospital Course by problem list: Principal Problem:   Acute encephalopathy Active Problems:   Essential hypertension, benign   Diabetes (HCC)   Generalized anxiety disorder   Hypothyroidism   Localization-related symptomatic 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, intractable, without status epilepticus (HCC)   Chronic daily headache   Acute Encephalopathy in the Setting of Epilepsy:neurology consulted in the emergency department.  Felt this was attributable to multiple CNS acting medications.  Recommended stopping Depakote due to being supratherapeutic and switching to Keppra 500mg  BID.  Also recommended tapering Xanax and stopping Trazodone.  Patient expressed agreement.  TSH was normal, B12 was mildly elevated above normal.  We continued his Celexa at discharge. Due to resistant seborrheic dermatitis and  resting tremor, possible cogwheel rigidity, broad-based shuffling gait w/ PT we should consider Parkinsons in the differential and will recommend evaluation as an outpatient. Tremor could also be related to benzodiazepine withdrawal, supratherapeutic Depakote  Generalized Weakness and Falls: Chronic problem. No focal findings on exam. No acute findings on CT head, MRI in September 2016 unremarkable. Likely medication ADR/interactions.  PT and OT evaluations recommending SNF. Patient states he would not want to go to SNF but is agreeable to home health servicess o these were ordered at discharge.  Positive HCV antibody: need to follow up viral load and genotype.  Consider ID referral if viral load elevated.  Generalized Anxiety Disorder: Continued celexa at discharge and recommended tapering Xanax.    Thrombocytopenia: Platelet count this morning 118 on day of discharge.  HIV negative, HCV antibody positive.  Monitor as outpatient.  Discharge Vitals:   BP 141/72 mmHg  Pulse 59  Temp(Src) 97.5 F (36.4 C) (Oral)  Resp 17  Ht 5\' 7"  (1.702 m)  Wt 228 lb 9.9 oz (103.7 kg)  BMI 35.80 kg/m2  SpO2 96%  Discharge Labs:  Results for orders placed or performed during the hospital encounter of 11/01/15 (from the past 24 hour(s))  Basic metabolic panel     Status: Abnormal   Collection Time: 11/02/15 10:05 AM  Result Value Ref Range   Sodium 138 135 - 145 mmol/L   Potassium 4.0 3.5 - 5.1 mmol/L   Chloride 100 (L) 101 - 111 mmol/L   CO2 28 22 - 32 mmol/L   Glucose, Bld 123 (H) 65 - 99 mg/dL   BUN 16 6 - 20 mg/dL   Creatinine, Ser 1.61 0.61 - 1.24 mg/dL   Calcium 9.3 8.9 - 09.6 mg/dL   GFR calc non Af Amer >60 >60 mL/min   GFR calc Af Amer >60 >60 mL/min   Anion gap 10 5 - 15  Glucose, capillary     Status: Abnormal   Collection Time: 11/02/15 12:05 PM  Result Value Ref Range   Glucose-Capillary 146 (H) 65 - 99 mg/dL  Glucose, capillary     Status: None   Collection Time: 11/02/15   4:33 PM  Result Value Ref Range   Glucose-Capillary 96 65 - 99 mg/dL  Glucose, capillary     Status: Abnormal   Collection Time: 11/02/15  9:44 PM  Result Value Ref Range   Glucose-Capillary 100 (H) 65 - 99 mg/dL  CBC     Status: Abnormal   Collection Time: 11/03/15  7:22 AM  Result Value Ref Range   WBC 5.0 4.0 - 10.5 K/uL   RBC 4.25 4.22 - 5.81 MIL/uL   Hemoglobin 13.1 13.0 - 17.0 g/dL   HCT 16.1 09.6 - 04.5 %   MCV 92.0 78.0 - 100.0 fL   MCH 30.8 26.0 - 34.0 pg   MCHC 33.5 30.0 - 36.0 g/dL   RDW 40.9 81.1 - 91.4 %   Platelets 118 (L) 150 - 400 K/uL  Glucose, capillary     Status: Abnormal   Collection Time: 11/03/15  7:47 AM  Result Value Ref Range   Glucose-Capillary 111 (H) 65 - 99 mg/dL  Vitamin N82     Status: Abnormal   Collection Time: 11/03/15  8:19 AM  Result Value Ref Range   Vitamin B-12 1113 (H) 180 - 914 pg/mL    Signed: Gwynn Burly, DO 11/03/2015, 10:04 AM    Services Ordered on Discharge: home health PT, OT, aide Equipment Ordered on Discharge: rolling walker, shower stool

## 2015-11-03 NOTE — Progress Notes (Signed)
Subjective: [name] was initially sleeping when I walked in the room but was easily arousable.  He has no complaints this morning.  He states he would like to go home and shower.  Worked with PT and OT yesterday.  Objective: Vital signs in last 24 hours: Filed Vitals:   11/02/15 0910 11/02/15 1359 11/02/15 2118 11/03/15 0410  BP:  127/64 124/66 141/72  Pulse:  82 59 59  Temp:  97.9 F (36.6 C) 98.2 F (36.8 C) 97.5 F (36.4 C)  TempSrc:  Oral Oral Oral  Resp:  Height:      Weight:    228 lb 9.9 oz (103.7 kg)  SpO2: 92% 95% 93% 96%   Weight change: 3 lb 9.9 oz (1.641 kg)  Intake/Output Summary (Last 24 hours) at 11/03/15 0929 Last data filed at 11/03/15 1610  Gross per 24 hour  Intake    108 ml  Output   2300 ml  Net  -2192 ml   General: lying in bed, comfortable appearing, no distress, mentating well HEENT: EOMI, no scleral icterus Pulm: normal work of breathing Ext: warm and well perfused, no pedal edema Neuro: alert and oriented, no focal deificts, follows commands, resting and essential tremor, possible cogwheel rigidity Skin: seborrheic dermatitis present on face  Lab Results: Basic Metabolic Panel:  Recent Labs Lab 11/02/15 0749 11/02/15 1005  NA 138 138  K 6.3* 4.0  CL 102 100*  CO2 26 28  GLUCOSE 100* 123*  BUN 19 16  CREATININE 0.67 0.64  CALCIUM 9.2 9.3   Liver Function Tests:  Recent Labs Lab 11/01/15 0432 11/02/15 0749  AST 17 31  ALT 12* 12*  ALKPHOS 50 50  BILITOT 0.5 1.3*  PROT 6.9 7.1  ALBUMIN 3.3* 3.3*   No results for input(s): LIPASE, AMYLASE in the last 168 hours.  Recent Labs Lab 11/01/15 0757  AMMONIA 28   CBC:  Recent Labs Lab 11/01/15 0432 11/02/15 0455 11/03/15 0722  WBC 6.2 6.2 5.0  NEUTROABS 2.3  --   --   HGB 14.3 15.4 13.1  HCT 42.4 45.2 39.1  MCV 94.2 93.6 92.0  PLT 108* 83* 118*   Cardiac Enzymes:  Recent Labs Lab 11/01/15 1206  TROPONINI <0.03   BNP: No results for input(s):  PROBNP in the last 168 hours. D-Dimer: No results for input(s): DDIMER in the last 168 hours. CBG:  Recent Labs Lab 11/02/15 0541 11/02/15 0811 11/02/15 1205 11/02/15 1633 11/02/15 2144 11/03/15 0747  GLUCAP 130* 89 146* 96 100* 111*   Hemoglobin A1C: No results for input(s): HGBA1C in the last 168 hours. Fasting Lipid Panel: No results for input(s): CHOL, HDL, LDLCALC, TRIG, CHOLHDL, LDLDIRECT in the last 168 hours. Thyroid Function Tests:  Recent Labs Lab 11/01/15 1206  TSH 3.110   Coagulation: No results for input(s): LABPROT, INR in the last 168 hours. Anemia Panel: No results for input(s): VITAMINB12, FOLATE, FERRITIN, TIBC, IRON, RETICCTPCT in the last 168 hours. Urine Drug Screen: Drugs of Abuse     Component Value Date/Time   LABOPIA NONE DETECTED 11/01/2015 0537   COCAINSCRNUR NONE DETECTED 11/01/2015 0537   LABBENZ POSITIVE* 11/01/2015 0537   AMPHETMU NONE DETECTED 11/01/2015 0537   THCU POSITIVE* 11/01/2015 0537   LABBARB NONE DETECTED 11/01/2015 0537    Alcohol Level:  Recent Labs Lab 11/01/15 0432  ETH <5   Urinalysis:  Recent Labs Lab 11/01/15 0537  COLORURINE AMBER*  LABSPEC 1.025  PHURINE 7.0  GLUCOSEU NEGATIVE  HGBUR NEGATIVE  BILIRUBINUR SMALL*  KETONESUR 15*  PROTEINUR NEGATIVE  NITRITE NEGATIVE  LEUKOCYTESUR NEGATIVE   Misc. Labs:   Micro Results: No results found for this or any previous visit (from the past 240 hour(s)). Studies/Results: No results found. Medications: I have reviewed the patient's current medications. Scheduled Meds: . ALPRAZolam  1.5 mg Oral BID  . antiseptic oral rinse  7 mL Mouth Rinse BID  . insulin aspart  0-9 Units Subcutaneous TID WC  . levETIRAcetam  500 mg Intravenous Q12H  . levothyroxine  150 mcg Oral QAC breakfast  . sodium chloride flush  3 mL Intravenous Q12H  . sodium chloride flush  3 mL Intravenous Q12H  . tiotropium  18 mcg Inhalation Daily   Continuous Infusions:  PRN  Meds:. Assessment/Plan: Principal Problem:   Acute encephalopathy Active Problems:   Essential hypertension, benign   Diabetes (HCC)   Generalized anxiety disorder   Hypothyroidism   Localization-related symptomatic 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, intractable, without status epilepticus (HCC)   Chronic daily headache   Acute Encephalopathy in the Setting of Epilepsy:doing well this morning and without complaints.  He is alert and oriented, tolerating switch to Keppra without issue.  Suspect his encephalopathy was due to combination of multiple CNS acting medications and supratherapeutic Depakote.  Due to resistant seborrheic dermatitis and resting tremor, possible cogwheel rigidity, broad-based shuffling gait w/ PT we should consider Parkinsons in the differential and will recommend evaluation as an outpatient.  Tremor could also be related to benzodiazepine withdrawal, supratherapeutic Depakote - appreciate neurology recs - continue Keppra - taper alprazolam >> currently on 1.5mg  BID - hold celexa and trazodone for now.  Stop trazodone at discharge - seizure precautions - MRI done in September only notable for mild cerebral atrophy - TSH normal - B12 pending  Generalized Weakness and Falls: Chronic problem. No focal findings on exam. No acute findings on CT head, MRI in September 2016 unremarkable. Likely medication ADR/interactions. - CNS med changes as above - fall precautions  - PT and OT evaluations recommending SNF.  Patient states he would not want to go to SNF but is agreeable to home health services.  COPD: Stable.  - continue Spiriva and prn albuterol  Essential Hypertension: Normotensive. Wife says he is compliant with daily lisinopril .  - hold for now - can likely resume once he is back to baseline mental status  Diabetes: Wife says he is compliant with metformin  daily. - SSI-S and CBG monitoring - resume metformin  at discharge  Generalized Anxiety Disorder: Holding Celexa in the setting of acute encephalopathy. - taper Xanax as above  Thrombocytopenia: Platelet count this morning 118. Other counts normal. - HIV negative - HCV antibody positive, viral load and genotype pending.  Will need to be followed up at hospital discharge. - hold pharmacologic DVT PPx  Diet: Carb modified  DVT PPx: SCDs  Code: Full code confirmed with wife.  Dispo: Home today  The patient does have a current PCP (Macy Mis, MD) and does not need an [facility] hospital follow-up appointment after discharge.  The patient does not have transportation limitations that hinder transportation to clinic appointments.  .Services Needed at time of discharge: Y = Yes, Blank = No PT:  home health  OT:  home health  RN:   Equipment:  rolling walker, shower stool  Other:  home health aide    LOS: 1 day   Gwynn Burly, DO 11/03/2015,  9:29 AM

## 2015-11-07 ENCOUNTER — Ambulatory Visit: Payer: Medicare Other | Admitting: Neurology

## 2015-11-08 NOTE — Progress Notes (Signed)
Lab called to inform charge RN that patient sample for Hep C quantitative was insuffincent and will have to be re drawn.Marland Kitchen Spoke to Dr. Patsy Baltimore to pass this information on.

## 2015-11-08 NOTE — Progress Notes (Signed)
I received a page from the 5W charge nurse that the sample was inadequate to obtain a Hepatitis C viral load and genotype. I called his primary care doctor and informed them that this would need to be drawn as an outpatient to guide future therapy.

## 2015-11-16 ENCOUNTER — Ambulatory Visit: Payer: Medicare Other | Admitting: Neurology

## 2015-12-05 ENCOUNTER — Ambulatory Visit: Payer: Medicare Other | Admitting: Neurology

## 2016-01-22 ENCOUNTER — Emergency Department (HOSPITAL_COMMUNITY): Payer: Medicare Other

## 2016-01-22 ENCOUNTER — Observation Stay (HOSPITAL_COMMUNITY)
Admission: EM | Admit: 2016-01-22 | Discharge: 2016-01-24 | Disposition: A | Discharge status: Home / Self Care | Payer: Medicare Other | Attending: Internal Medicine | Admitting: Internal Medicine

## 2016-01-22 ENCOUNTER — Encounter (HOSPITAL_COMMUNITY): Payer: Self-pay | Admitting: Emergency Medicine

## 2016-01-22 DIAGNOSIS — S0081XA Abrasion of other part of head, initial encounter: Secondary | ICD-10-CM | POA: Diagnosis not present

## 2016-01-22 DIAGNOSIS — D649 Anemia, unspecified: Secondary | ICD-10-CM | POA: Insufficient documentation

## 2016-01-22 DIAGNOSIS — K219 Gastro-esophageal reflux disease without esophagitis: Secondary | ICD-10-CM | POA: Diagnosis not present

## 2016-01-22 DIAGNOSIS — G92 Toxic encephalopathy: Secondary | ICD-10-CM | POA: Diagnosis present

## 2016-01-22 DIAGNOSIS — Y998 Other external cause status: Secondary | ICD-10-CM | POA: Diagnosis not present

## 2016-01-22 DIAGNOSIS — F17210 Nicotine dependence, cigarettes, uncomplicated: Secondary | ICD-10-CM | POA: Insufficient documentation

## 2016-01-22 DIAGNOSIS — E785 Hyperlipidemia, unspecified: Secondary | ICD-10-CM | POA: Insufficient documentation

## 2016-01-22 DIAGNOSIS — E038 Other specified hypothyroidism: Secondary | ICD-10-CM

## 2016-01-22 DIAGNOSIS — J439 Emphysema, unspecified: Secondary | ICD-10-CM | POA: Diagnosis not present

## 2016-01-22 DIAGNOSIS — J438 Other emphysema: Secondary | ICD-10-CM | POA: Diagnosis not present

## 2016-01-22 DIAGNOSIS — E079 Disorder of thyroid, unspecified: Secondary | ICD-10-CM | POA: Insufficient documentation

## 2016-01-22 DIAGNOSIS — Z880 Allergy status to penicillin: Secondary | ICD-10-CM | POA: Diagnosis not present

## 2016-01-22 DIAGNOSIS — G9340 Encephalopathy, unspecified: Secondary | ICD-10-CM | POA: Diagnosis not present

## 2016-01-22 DIAGNOSIS — Y9289 Other specified places as the place of occurrence of the external cause: Secondary | ICD-10-CM | POA: Diagnosis not present

## 2016-01-22 DIAGNOSIS — E119 Type 2 diabetes mellitus without complications: Secondary | ICD-10-CM | POA: Diagnosis not present

## 2016-01-22 DIAGNOSIS — G928 Other toxic encephalopathy: Secondary | ICD-10-CM | POA: Diagnosis present

## 2016-01-22 DIAGNOSIS — L8990 Pressure ulcer of unspecified site, unspecified stage: Secondary | ICD-10-CM | POA: Diagnosis present

## 2016-01-22 DIAGNOSIS — D696 Thrombocytopenia, unspecified: Secondary | ICD-10-CM

## 2016-01-22 DIAGNOSIS — J449 Chronic obstructive pulmonary disease, unspecified: Secondary | ICD-10-CM | POA: Diagnosis present

## 2016-01-22 DIAGNOSIS — S40212A Abrasion of left shoulder, initial encounter: Secondary | ICD-10-CM | POA: Diagnosis not present

## 2016-01-22 DIAGNOSIS — F329 Major depressive disorder, single episode, unspecified: Secondary | ICD-10-CM | POA: Diagnosis not present

## 2016-01-22 DIAGNOSIS — I10 Essential (primary) hypertension: Secondary | ICD-10-CM | POA: Insufficient documentation

## 2016-01-22 DIAGNOSIS — X58XXXA Exposure to other specified factors, initial encounter: Secondary | ICD-10-CM | POA: Diagnosis not present

## 2016-01-22 DIAGNOSIS — F32A Depression, unspecified: Secondary | ICD-10-CM | POA: Diagnosis present

## 2016-01-22 DIAGNOSIS — Z79899 Other long term (current) drug therapy: Secondary | ICD-10-CM | POA: Insufficient documentation

## 2016-01-22 DIAGNOSIS — Z7984 Long term (current) use of oral hypoglycemic drugs: Secondary | ICD-10-CM | POA: Diagnosis not present

## 2016-01-22 DIAGNOSIS — S0001XA Abrasion of scalp, initial encounter: Secondary | ICD-10-CM | POA: Diagnosis not present

## 2016-01-22 DIAGNOSIS — R4182 Altered mental status, unspecified: Secondary | ICD-10-CM | POA: Diagnosis present

## 2016-01-22 DIAGNOSIS — F411 Generalized anxiety disorder: Secondary | ICD-10-CM | POA: Diagnosis present

## 2016-01-22 DIAGNOSIS — E039 Hypothyroidism, unspecified: Secondary | ICD-10-CM | POA: Diagnosis present

## 2016-01-22 DIAGNOSIS — Y9389 Activity, other specified: Secondary | ICD-10-CM | POA: Insufficient documentation

## 2016-01-22 DIAGNOSIS — G40909 Epilepsy, unspecified, not intractable, without status epilepticus: Secondary | ICD-10-CM

## 2016-01-22 LAB — RAPID URINE DRUG SCREEN, HOSP PERFORMED
Amphetamines: NOT DETECTED
Barbiturates: NOT DETECTED
Benzodiazepines: POSITIVE — AB
Cocaine: NOT DETECTED
Opiates: NOT DETECTED
Tetrahydrocannabinol: NOT DETECTED

## 2016-01-22 LAB — I-STAT VENOUS BLOOD GAS, ED
ACID-BASE EXCESS: 1 mmol/L (ref 0.0–2.0)
Bicarbonate: 27.7 mEq/L — ABNORMAL HIGH (ref 20.0–24.0)
O2 Saturation: 79 %
PCO2 VEN: 49.1 mmHg (ref 45.0–50.0)
PO2 VEN: 45 mmHg (ref 31.0–45.0)
TCO2: 29 mmol/L (ref 0–100)
pH, Ven: 7.358 — ABNORMAL HIGH (ref 7.250–7.300)

## 2016-01-22 LAB — URINALYSIS, ROUTINE W REFLEX MICROSCOPIC
BILIRUBIN URINE: NEGATIVE
Glucose, UA: NEGATIVE mg/dL
HGB URINE DIPSTICK: NEGATIVE
KETONES UR: 15 mg/dL — AB
Leukocytes, UA: NEGATIVE
Nitrite: NEGATIVE
PROTEIN: 30 mg/dL — AB
Specific Gravity, Urine: 1.027 (ref 1.005–1.030)
pH: 8.5 — ABNORMAL HIGH (ref 5.0–8.0)

## 2016-01-22 LAB — COMPREHENSIVE METABOLIC PANEL
ALT: 14 U/L — AB (ref 17–63)
AST: 33 U/L (ref 15–41)
Albumin: 3.5 g/dL (ref 3.5–5.0)
Alkaline Phosphatase: 44 U/L (ref 38–126)
Anion gap: 8 (ref 5–15)
BUN: 23 mg/dL — ABNORMAL HIGH (ref 6–20)
CALCIUM: 8.9 mg/dL (ref 8.9–10.3)
CHLORIDE: 106 mmol/L (ref 101–111)
CO2: 27 mmol/L (ref 22–32)
CREATININE: 0.86 mg/dL (ref 0.61–1.24)
Glucose, Bld: 120 mg/dL — ABNORMAL HIGH (ref 65–99)
Potassium: 4.4 mmol/L (ref 3.5–5.1)
Sodium: 141 mmol/L (ref 135–145)
TOTAL PROTEIN: 6.7 g/dL (ref 6.5–8.1)
Total Bilirubin: 0.6 mg/dL (ref 0.3–1.2)

## 2016-01-22 LAB — CBC WITH DIFFERENTIAL/PLATELET
BASOS ABS: 0 10*3/uL (ref 0.0–0.1)
BASOS PCT: 0 %
Eosinophils Absolute: 0 10*3/uL (ref 0.0–0.7)
Eosinophils Relative: 0 %
HEMATOCRIT: 42.3 % (ref 39.0–52.0)
Hemoglobin: 13.9 g/dL (ref 13.0–17.0)
LYMPHS PCT: 28 %
Lymphs Abs: 2 10*3/uL (ref 0.7–4.0)
MCH: 31.9 pg (ref 26.0–34.0)
MCHC: 32.9 g/dL (ref 30.0–36.0)
MCV: 97 fL (ref 78.0–100.0)
Monocytes Absolute: 1 10*3/uL (ref 0.1–1.0)
Monocytes Relative: 14 %
NEUTROS ABS: 4.1 10*3/uL (ref 1.7–7.7)
NEUTROS PCT: 58 %
Platelets: 120 10*3/uL — ABNORMAL LOW (ref 150–400)
RBC: 4.36 MIL/uL (ref 4.22–5.81)
RDW: 13.3 % (ref 11.5–15.5)
WBC: 7.2 10*3/uL (ref 4.0–10.5)

## 2016-01-22 LAB — URINE MICROSCOPIC-ADD ON
RBC / HPF: NONE SEEN RBC/hpf (ref 0–5)
WBC UA: NONE SEEN WBC/hpf (ref 0–5)

## 2016-01-22 LAB — I-STAT TROPONIN, ED: Troponin i, poc: 0 ng/mL (ref 0.00–0.08)

## 2016-01-22 LAB — ETHANOL: Alcohol, Ethyl (B): <5 mg/dL (ref <5)

## 2016-01-22 LAB — AMMONIA: AMMONIA: 40 umol/L — AB (ref 9–35)

## 2016-01-22 LAB — VALPROIC ACID LEVEL: VALPROIC ACID LVL: 82 ug/mL (ref 50.0–100.0)

## 2016-01-22 MED ORDER — ONDANSETRON HCL 4 MG/2ML IJ SOLN: 4.0000 mg | Freq: Four times a day (QID) | INTRAMUSCULAR | Status: DC | PRN

## 2016-01-22 MED ORDER — PANTOPRAZOLE SODIUM 40 MG PO TBEC
40.0000 mg | DELAYED_RELEASE_TABLET | Freq: Every day | ORAL | Status: DC
  Administered 2016-01-23 – 2016-01-24 (×2): 40 mg via ORAL
  Filled 2016-01-22 (×2): qty 1

## 2016-01-22 MED ORDER — SODIUM CHLORIDE 0.9% FLUSH
3.0000 mL | Freq: Two times a day (BID) | INTRAVENOUS | Status: DC
  Administered 2016-01-22 – 2016-01-24 (×3): 3 mL via INTRAVENOUS

## 2016-01-22 MED ORDER — LEVOTHYROXINE SODIUM 75 MCG PO TABS
150.0000 ug | ORAL_TABLET | Freq: Every day | ORAL | Status: DC
  Administered 2016-01-23 – 2016-01-24 (×2): 150 ug via ORAL
  Filled 2016-01-22 (×2): qty 2
  Filled 2016-01-22: qty 1

## 2016-01-22 MED ORDER — LISINOPRIL 5 MG PO TABS
2.5000 mg | ORAL_TABLET | Freq: Every day | ORAL | Status: DC
  Administered 2016-01-23 – 2016-01-24 (×2): 2.5 mg via ORAL
  Filled 2016-01-22 (×3): qty 1

## 2016-01-22 MED ORDER — POLYETHYLENE GLYCOL 3350 17 G PO PACK
17.0000 g | PACK | Freq: Every day | ORAL | Status: DC | PRN
  Filled 2016-01-22: qty 1

## 2016-01-22 MED ORDER — INSULIN ASPART 100 UNIT/ML ~~LOC~~ SOLN
0.0000 [IU] | Freq: Three times a day (TID) | SUBCUTANEOUS | Status: DC
Start: 2016-01-23 — End: 2016-01-24
  Administered 2016-01-23: 1 [IU] via SUBCUTANEOUS
  Administered 2016-01-23: 2 [IU] via SUBCUTANEOUS

## 2016-01-22 MED ORDER — TIOTROPIUM BROMIDE MONOHYDRATE 18 MCG IN CAPS
18.0000 ug | ORAL_CAPSULE | Freq: Every day | RESPIRATORY_TRACT | Status: DC
  Administered 2016-01-23: 18 ug via RESPIRATORY_TRACT
  Filled 2016-01-22: qty 5

## 2016-01-22 MED ORDER — ENOXAPARIN SODIUM 40 MG/0.4ML ~~LOC~~ SOLN
40.0000 mg | Freq: Every day | SUBCUTANEOUS | Status: DC
  Administered 2016-01-23: 40 mg via SUBCUTANEOUS
  Filled 2016-01-22 (×3): qty 0.4

## 2016-01-22 MED ORDER — ALPRAZOLAM 0.5 MG PO TABS
0.5000 mg | ORAL_TABLET | Freq: Two times a day (BID) | ORAL | Status: DC | PRN
  Administered 2016-01-23 – 2016-01-24 (×2): 0.5 mg via ORAL
  Filled 2016-01-22 (×2): qty 1

## 2016-01-22 MED ORDER — ACETAMINOPHEN 325 MG PO TABS
650.0000 mg | ORAL_TABLET | Freq: Four times a day (QID) | ORAL | Status: DC | PRN
  Administered 2016-01-24 (×2): 650 mg via ORAL
  Filled 2016-01-22 (×2): qty 2

## 2016-01-22 MED ORDER — ACETAMINOPHEN 650 MG RE SUPP: 650.0000 mg | Freq: Four times a day (QID) | RECTAL | Status: DC | PRN

## 2016-01-22 MED ORDER — ALBUTEROL SULFATE HFA 108 (90 BASE) MCG/ACT IN AERS: 2.0000 | INHALATION_SPRAY | RESPIRATORY_TRACT | Status: DC | PRN

## 2016-01-22 MED ORDER — ATORVASTATIN CALCIUM 40 MG PO TABS
40.0000 mg | ORAL_TABLET | Freq: Every day | ORAL | Status: DC
  Administered 2016-01-23 – 2016-01-24 (×2): 40 mg via ORAL
  Filled 2016-01-22 (×2): qty 1

## 2016-01-22 MED ORDER — DIVALPROEX SODIUM 500 MG PO DR TAB
500.0000 mg | DELAYED_RELEASE_TABLET | Freq: Two times a day (BID) | ORAL | Status: DC
  Administered 2016-01-22 – 2016-01-24 (×4): 500 mg via ORAL
  Filled 2016-01-22 (×3): qty 1
  Filled 2016-01-22: qty 2

## 2016-01-22 MED ORDER — ONDANSETRON HCL 4 MG PO TABS: 4.0000 mg | ORAL_TABLET | Freq: Four times a day (QID) | ORAL | Status: DC | PRN

## 2016-01-22 MED ORDER — SODIUM CHLORIDE 0.9 % IV SOLN
INTRAVENOUS | Status: AC
  Administered 2016-01-22 – 2016-01-23 (×2): via INTRAVENOUS

## 2016-01-22 MED ORDER — HYDROCODONE-ACETAMINOPHEN 5-325 MG PO TABS
1.0000 | ORAL_TABLET | ORAL | Status: DC | PRN
  Administered 2016-01-23: 1 via ORAL
  Filled 2016-01-22: qty 2

## 2016-01-22 MED ORDER — CITALOPRAM HYDROBROMIDE 20 MG PO TABS
20.0000 mg | ORAL_TABLET | Freq: Every day | ORAL | Status: DC
  Administered 2016-01-23 – 2016-01-24 (×2): 20 mg via ORAL
  Filled 2016-01-22 (×2): qty 1

## 2016-01-22 MED ORDER — BISACODYL 5 MG PO TBEC
5.0000 mg | DELAYED_RELEASE_TABLET | Freq: Every day | ORAL | Status: DC | PRN
  Filled 2016-01-22: qty 1

## 2016-01-22 NOTE — H&P (Signed)
History and Physical    [name] WUJ:[id number] DOB: May 28, 1961 DOA: 01/22/2016  Referring Provider: EDP PCP: Delbert Harness, MD  Outpatient Specialists: Dr. Karel Jarvis (neurology)   Patient coming from: Home  Chief Complaint: Found down, confused  HPI: [name] is a 55 y.o. male with medical history significant for seizure disorder, depression, anxiety, COPD, hypertension, and type 2 diabetes mellitus who presents to the ED with confusion and numerous superficial abrasions. Patient reports being in his usual state of health upon waking this morning and was dropped off at a shop to work on his motorcycle at approximately 1 PM. His wife called EMS for welfare check when the patient would not answer his phone at around 6 PM and he was found on the ground in the shop with confusion and scattered superficial abrasions. Patient is unable to recall the events that led to his current state. He denies any recent fevers, chills, or increased cough. There is been no recent long distance travel or sick contacts. Patient denies chest pain, palpitations, abdominal pain, nausea, vomiting, or diarrhea. There has been no recent change in his medications, and the patient denies any recent use of alcohol or illicit substances. Of note, he was admitted to this institution in February 2017 under very similar circumstances, neurology was involved in the case, and his condition was thought to be secondary to medications. He was discharged from the hospital at that time with instructions to discontinue trazodone and taper down his Xanax.  ED Course: Upon arrival to the ED, patient is found to be afebrile, requiring 2 L/m supplemental oxygen to maintain adequate saturations, and with vitals otherwise stable. EKG features a sinus rhythm and troponin is undetectable. Chest x-ray is negative for acute cardiopulmonary disease. CT of the head and cervical spine are negative for acute intracranial or cervical pathology. CMP  features in elevation and BUN to creatinine ratio and CBC is notable for a thrombocytopenia with platelet count 120,000. UDS is positive only for benzodiazepines and urinalysis is grossly negative for infection. Patient had no real complaints in the emergency department, but remained confused. He remains hemodynamically stable and will be admitted to the hospital for ongoing evaluation and management of acute encephalopathy.  Review of Systems:  All other systems reviewed and apart from HPI, are negative.  Past Medical History  Diagnosis Date  . Seizures (HCC)   . Hypertension   . Emphysema of lung (HCC)   . Depression   . Diabetes mellitus without complication (HCC)   . Thyroid disease   . Hyperlipidemia   . Hiatal hernia   . Anemia   . GERD (gastroesophageal reflux disease)   . Memory loss     Past Surgical History  Procedure Laterality Date  . Tonsillectomy and adenoidectomy    . Dental surgery    . Eye surgery    . Appendectomy       reports that he has been smoking E-cigarettes and Cigarettes.  He has smoked for the past 40 years. He has never used smokeless tobacco. He reports that he does not drink alcohol or use illicit drugs.  Allergies  Allergen Reactions  . Levetiracetam Other (See Comments)    Severe anxiety, agitation, hyperactivity, very negative disposition  . Tegretol [Carbamazepine] Anxiety and Other (See Comments)    Increase seizures/ Induces seizures  . Penicillins Other (See Comments)    Reaction unknown  . Topiramate Other (See Comments)    Family History  Problem Relation Age of Onset  .  Stroke Mother   . Heart disease Father   . Thyroid disease Father   . Thyroid disease Brother      Prior to Admission medications   Medication Sig Start Date End Date Taking? Authorizing Provider  albuterol (PROVENTIL HFA;VENTOLIN HFA) 108 (90 BASE) MCG/ACT inhaler Inhale 2 puffs into the lungs every 4 (four) hours as needed for wheezing or shortness of  breath.   Yes Historical Provider, MD  alprazolam Prudy Feeler) 2 MG tablet Take 0.5 tablets (1 mg total) by mouth 2 (two) times daily. 11/03/15  Yes Gwynn Burly, DO  atorvastatin (LIPITOR) 40 MG tablet Take 40 mg by mouth daily.   Yes Historical Provider, MD  citalopram (CELEXA) 20 MG tablet Take 20 mg by mouth daily.  02/25/15  Yes Historical Provider, MD  divalproex (DEPAKOTE) 250 MG DR tablet Take 500 mg by mouth 2 (two) times daily. 03/23/14  Yes Historical Provider, MD  ketoconazole (NIZORAL) 2 % cream Apply 1 application topically daily.   Yes Historical Provider, MD  levothyroxine (SYNTHROID, LEVOTHROID) 150 MCG tablet Take 150 mcg by mouth daily before breakfast.  12/30/14  Yes Historical Provider, MD  lisinopril (PRINIVIL,ZESTRIL) 2.5 MG tablet Take 2.5 mg by mouth daily. 05/09/15  Yes Historical Provider, MD  metformin (FORTAMET) 500 MG (OSM) 24 hr tablet Take 500 mg by mouth 2 (two) times daily. 01/20/16 01/19/17 Yes Historical Provider, MD  omeprazole (PRILOSEC) 20 MG capsule Take 20 mg by mouth daily.   Yes Historical Provider, MD  tiotropium (SPIRIVA) 18 MCG inhalation capsule Place 18 mcg into inhaler and inhale daily.   Yes Historical Provider, MD  traZODone (DESYREL) 150 MG tablet Take 150 mg by mouth at bedtime as needed for sleep.  05/28/12  Yes Historical Provider, MD  levETIRAcetam (KEPPRA) 500 MG tablet Take 1 tablet (500 mg total) by mouth 2 (two) times daily. 11/03/15   Gwynn Burly, DO    Physical Exam: Filed Vitals:   01/22/16 1945 01/22/16 2029 01/22/16 2100 01/22/16 2200  BP: 106/66 107/55 102/57 124/86  Pulse: 86 80 79 80  Temp:      TempSrc:      Resp: 25 20 18 13   Height:      Weight:      SpO2: 95% 94% 94% 96%      Constitutional: NAD, calm, comfortable, lethargic, superficial abrasions  Eyes: PERTLA, lids and conjunctivae normal ENMT: Mucous membranes are moist. Posterior pharynx clear of any exudate or lesions.   Neck: normal, supple, no masses, no  thyromegaly Respiratory: clear to auscultation bilaterally, no wheezing, no crackles. Normal respiratory effort.   Cardiovascular: S1 & S2 heard, regular rate and rhythm, no murmurs / rubs / gallops. No extremity edema.    Abdomen: No distension, no tenderness, no masses palpated. Bowel sounds normal.  Musculoskeletal: no clubbing / cyanosis. No joint deformity upper and lower extremities. Normal muscle tone.  Skin: no rashes or ulcers. No induration. Scattered superficial abrasions Neurologic: CN 2-12 grossly intact. Sensation intact, DTR normal. Strength 5/5 in all 4 limbs. Lethargic, oriented to person, but not year, knows he is in a hospital Psychiatric:  Normal mood and affect.     Labs on Admission: I have personally reviewed following labs and imaging studies  CBC:  Recent Labs Lab 01/22/16 1736  WBC 7.2  NEUTROABS 4.1  HGB 13.9  HCT 42.3  MCV 97.0  PLT 120*   Basic Metabolic Panel:  Recent Labs Lab 01/22/16 1736  NA 141  K 4.4  CL 106  CO2 27  GLUCOSE 120*  BUN 23*  CREATININE 0.86  CALCIUM 8.9   GFR: Estimated Creatinine Clearance: 108 mL/min (by C-G formula based on Cr of 0.86). Liver Function Tests:  Recent Labs Lab 01/22/16 1736  AST 33  ALT 14*  ALKPHOS 44  BILITOT 0.6  PROT 6.7  ALBUMIN 3.5   No results for input(s): LIPASE, AMYLASE in the last 168 hours.  Recent Labs Lab 01/22/16 2134  AMMONIA 40*   Coagulation Profile: No results for input(s): INR, PROTIME in the last 168 hours. Cardiac Enzymes: No results for input(s): CKTOTAL, CKMB, CKMBINDEX, TROPONINI in the last 168 hours. BNP (last 3 results) No results for input(s): PROBNP in the last 8760 hours. HbA1C: No results for input(s): HGBA1C in the last 72 hours. CBG: No results for input(s): GLUCAP in the last 168 hours. Lipid Profile: No results for input(s): CHOL, HDL, LDLCALC, TRIG, CHOLHDL, LDLDIRECT in the last 72 hours. Thyroid Function Tests: No results for input(s):  TSH, T4TOTAL, FREET4, T3FREE, THYROIDAB in the last 72 hours. Anemia Panel: No results for input(s): VITAMINB12, FOLATE, FERRITIN, TIBC, IRON, RETICCTPCT in the last 72 hours. Urine analysis:    Component Value Date/Time   COLORURINE YELLOW 01/22/2016 1927   APPEARANCEUR CLEAR 01/22/2016 1927   LABSPEC 1.027 01/22/2016 1927   PHURINE 8.5* 01/22/2016 1927   GLUCOSEU NEGATIVE 01/22/2016 1927   HGBUR NEGATIVE 01/22/2016 1927   BILIRUBINUR NEGATIVE 01/22/2016 1927   KETONESUR 15* 01/22/2016 1927   PROTEINUR 30* 01/22/2016 1927   UROBILINOGEN 0.2 03/03/2014 1750   NITRITE NEGATIVE 01/22/2016 1927   LEUKOCYTESUR NEGATIVE 01/22/2016 1927   Sepsis Labs: (procalcitonin:4,lacticidven:4) )No results found for this or any previous visit (from the past 240 hour(s)).   Radiological Exams on Admission: Ct Head Wo Contrast  01/22/2016  CLINICAL DATA:  Found on ground next to his motorcycle.  Confused. EXAM: CT HEAD WITHOUT CONTRAST CT CERVICAL SPINE WITHOUT CONTRAST TECHNIQUE: Multidetector CT imaging of the head and cervical spine was performed following the standard protocol without intravenous contrast. Multiplanar CT image reconstructions of the cervical spine were also generated. COMPARISON:  11/01/2015 FINDINGS: CT HEAD FINDINGS There is no intracranial hemorrhage, mass or evidence of acute infarction. There is no extra-axial fluid collection. There is mild generalized atrophy. Gray matter and white matter are unremarkable, with normal differentiation. Calvarium and skullbase are intact. There is soft tissue swelling in the right posterior parietal scalp. There is a 4 mm radiopaque foreign body of the skin in this location. Orbits are intact, unremarkable. CT CERVICAL SPINE FINDINGS The vertebral column, pedicles and facet articulations are intact. There is no evidence of acute fracture. No acute soft tissue abnormalities are evident. No significant arthritic changes are evident. IMPRESSION:  1. Negative for acute intracranial traumatic injury. 2. Right posterior parietal scalp soft tissue swelling with a foreign body of the skin 3. Negative for acute cervical spine fracture. Electronically Signed   By: Ellery Plunk M.D.   On: 01/22/2016 20:37   Ct Cervical Spine Wo Contrast  01/22/2016  CLINICAL DATA:  Found on ground next to his motorcycle.  Confused. EXAM: CT HEAD WITHOUT CONTRAST CT CERVICAL SPINE WITHOUT CONTRAST TECHNIQUE: Multidetector CT imaging of the head and cervical spine was performed following the standard protocol without intravenous contrast. Multiplanar CT image reconstructions of the cervical spine were also generated. COMPARISON:  11/01/2015 FINDINGS: CT HEAD FINDINGS There is no intracranial hemorrhage, mass or evidence of acute infarction. There is no extra-axial fluid  collection. There is mild generalized atrophy. Gray matter and white matter are unremarkable, with normal differentiation. Calvarium and skullbase are intact. There is soft tissue swelling in the right posterior parietal scalp. There is a 4 mm radiopaque foreign body of the skin in this location. Orbits are intact, unremarkable. CT CERVICAL SPINE FINDINGS The vertebral column, pedicles and facet articulations are intact. There is no evidence of acute fracture. No acute soft tissue abnormalities are evident. No significant arthritic changes are evident. IMPRESSION: 1. Negative for acute intracranial traumatic injury. 2. Right posterior parietal scalp soft tissue swelling with a foreign body of the skin 3. Negative for acute cervical spine fracture. Electronically Signed   By: Ellery Plunkaniel R Mitchell M.D.   On: 01/22/2016 20:37   Dg Chest Port 1 View  01/22/2016  CLINICAL DATA:  Altered mental status. Found on ground near his motorcycle. EXAM: PORTABLE CHEST 1 VIEW COMPARISON:  03/05/2014 FINDINGS: A single AP portable view of the chest demonstrates no focal airspace consolidation or alveolar edema. The lungs are  grossly clear. There is no large effusion or pneumothorax. Cardiac and mediastinal contours appear unremarkable. IMPRESSION: No active disease. Electronically Signed   By: Ellery Plunkaniel R Mitchell M.D.   On: 01/22/2016 19:58    EKG: Independently reviewed. Sinus rhythm   Assessment/Plan  1. Acute encephalopathy  - CT with no acute intracranial findings  - Electrolytes wnl; UDS positive for benzo only (prescribed)  - Lasting longer than would expect for post-ictal period  - Suspect polypharmacy is contributing  - Check thyroid studies, B12, folate, thiamine, RPR, ammonia  - Admitted in February 2017 under similar circumstances and neuro consultant advised stopping trazodone and tapering off Xanax at that time  - Trazodone still on med list, will hold; Xanax previously 2 mg BID, reduced to 1 mg BID at time of hospital d/c in Feb '17, will drop to 0.5 mg BID now   2. Seizure disorder  - Managed with Depakote 500 BID, VPA level in the therapeutic range  - Was changed to Keppra during last admission, but reportedly experienced aggression and agitation so was discontinued - Continue current-dose Depakote  - Seizure precautions   3. COPD - Appears to be stable  - No wheezes on auscultation  - Continue Spiriva  - DuoNeb q4h prn   4. Hypothyroidism - Thyroid studies ordered and pending  - Continue current-dose Synthroid for now   5. Depression, anxiety  - Difficult to assess currently given the acute encephalopathy  - Continue Celxa  - Hold trazodone per neuro recs from last admit with same, consider discontinuing  - Continue Xanax at reduced-dose    6. Hypertension  - At goal currently  - Continue current-dose lisinopril  7. Type II DM  - Managed at home with metformin 500 mg qD  - No A1c on file  - Hold metformin while inpt  - Check CBG with meals and qHS  - Manage with low-intensity sliding-scale insulin while inpt, adjust prn  - A1c ordered   8. Thrombocytopenia  - Platelet  count 120k on admission, consistent with prior measurements  - Likely secondary to Depakote use  - No sign of active blood loss    DVT prophylaxis: sq Lovenox  Code Status: Full  Family Communication: None available  Disposition Plan: Admit to telemetry  Consults called: None  Admission status: Observation    Briscoe Deutscherimothy S Opyd MD [facility] Pager [phone]  If 7PM-7AM, please contact night-coverage [email or web address] Password [email or web address]  01/22/2016, 10:33 PM

## 2016-01-22 NOTE — ED Notes (Signed)
Cleaned the back of pt's head with Saf- Clens.

## 2016-01-22 NOTE — ED Notes (Signed)
Ems reports pt was found on the ground near his motorcycle that he was working on/ in [place] [employer] [place]. Pt is confused and doesn't remember what happened. Pt has abrasions on both elbows and head.

## 2016-01-22 NOTE — ED Provider Notes (Signed)
CSN: [id number]     Arrival date & time 01/22/16  1844 History   First MD Initiated Contact with Patient 01/22/16 1903     Chief Complaint  Patient presents with  . Altered Mental Status    The history is provided by the EMS personnel and the spouse. No language interpreter was used.   [name] is a 55 y.o. male who presents to the Emergency Department complaining of AMS. Caveat due to altered mental status. History is provided by EMS and the patient's wife. Patient's wife dropped him off at their storage building about 1 PM. He was not answering the phone about 6pm and she called police who went to check on him. He was found confused on the floor with abrasions to his head and shoulder. He has a history of seizures and confusion. Patient has no complaints in the emergency department. He does not recall what happened today.  Past Medical History  Diagnosis Date  . Seizures (HCC)   . Hypertension   . Emphysema of lung (HCC)   . Depression   . Diabetes mellitus without complication (HCC)   . Thyroid disease   . Hyperlipidemia   . Hiatal hernia   . Anemia   . GERD (gastroesophageal reflux disease)   . Memory loss    Past Surgical History  Procedure Laterality Date  . Tonsillectomy and adenoidectomy    . Dental surgery    . Eye surgery    . Appendectomy     Family History  Problem Relation Age of Onset  . Stroke Mother   . Heart disease Father   . Thyroid disease Father   . Thyroid disease Brother    Social History  Substance Use Topics  . Smoking status: Current Every Day Smoker -- 40 years    Types: E-cigarettes, Cigarettes  . Smokeless tobacco: Never Used     Comment: 11/01/2015 "did smoke 3 ppd; he's been vaping for 4 years; no nicotine for the past year; smoked a cigarette 2 days ago"  . Alcohol Use: No    Review of Systems  Unable to perform ROS: Mental status change      Allergies  Levetiracetam; Tegretol; Penicillins; and Topiramate  Home Medications    Prior to Admission medications   Medication Sig Start Date End Date Taking? Authorizing Provider  albuterol (PROVENTIL HFA;VENTOLIN HFA) 108 (90 BASE) MCG/ACT inhaler Inhale 2 puffs into the lungs every 4 (four) hours as needed for wheezing or shortness of breath.   Yes Historical Provider, MD  alprazolam Prudy Feeler(XANAX) 2 MG tablet Take 0.5 tablets (1 mg total) by mouth 2 (two) times daily. 11/03/15  Yes Gwynn BurlyAndrew Wallace, DO  atorvastatin (LIPITOR) 40 MG tablet Take 40 mg by mouth daily.   Yes Historical Provider, MD  citalopram (CELEXA) 20 MG tablet Take 20 mg by mouth daily.  02/25/15  Yes Historical Provider, MD  divalproex (DEPAKOTE) 250 MG DR tablet Take 500 mg by mouth 2 (two) times daily. 03/23/14  Yes Historical Provider, MD  ketoconazole (NIZORAL) 2 % cream Apply 1 application topically daily.   Yes Historical Provider, MD  levothyroxine (SYNTHROID, LEVOTHROID) 150 MCG tablet Take 150 mcg by mouth daily before breakfast.  12/30/14  Yes Historical Provider, MD  lisinopril (PRINIVIL,ZESTRIL) 2.5 MG tablet Take 2.5 mg by mouth daily. 05/09/15  Yes Historical Provider, MD  metformin (FORTAMET) 500 MG (OSM) 24 hr tablet Take 500 mg by mouth 2 (two) times daily. 01/20/16 01/19/17 Yes Historical Provider, MD  omeprazole (  PRILOSEC) 20 MG capsule Take 20 mg by mouth daily.   Yes Historical Provider, MD  tiotropium (SPIRIVA) 18 MCG inhalation capsule Place 18 mcg into inhaler and inhale daily.   Yes Historical Provider, MD  traZODone (DESYREL) 150 MG tablet Take 150 mg by mouth at bedtime as needed for sleep.  05/28/12  Yes Historical Provider, MD  levETIRAcetam (KEPPRA) 500 MG tablet Take 1 tablet (500 mg total) by mouth 2 (two) times daily. 11/03/15   Gwynn Burly, DO   BP 119/68 mmHg  Pulse 76  Temp(Src) 98.6 F (37 C) (Oral)  Resp 21  Ht  (1.702 m)  Wt 220 lb (99.791 kg)  BMI 34.45 kg/m2  SpO2 96% Physical Exam  Constitutional: He appears well-developed and well-nourished.  HENT:  Head:  Normocephalic.  Abrasion to left forehead and posterior scalp  Eyes: Pupils are equal, round, and reactive to light.  Cardiovascular: Normal rate and regular rhythm.   No murmur heard. Pulmonary/Chest: Effort normal and breath sounds normal. No respiratory distress.  Abdominal: Soft. There is no tenderness. There is no rebound and no guarding.  Musculoskeletal: He exhibits no tenderness.  1+ pitting edema bilateral lower x-rays. Abrasion to left shoulder without any local tenderness  Neurological:  Lethargic, arousable to verbal stimuli. Disoriented to place and time. Generalized weakness.  Skin: Skin is warm and dry.  Psychiatric: He has a normal mood and affect. His behavior is normal.  Nursing note and vitals reviewed.   ED Course  Procedures (including critical care time) Labs Review Labs Reviewed  COMPREHENSIVE METABOLIC PANEL - Abnormal; Notable for the following:    Glucose, Bld 120 (*)    BUN 23 (*)    ALT 14 (*)    All other components within normal limits  URINALYSIS, ROUTINE W REFLEX MICROSCOPIC (NOT AT [facility]) - Abnormal; Notable for the following:    pH 8.5 (*)    Ketones, ur 15 (*)    Protein, ur 30 (*)    All other components within normal limits  URINE RAPID DRUG SCREEN, HOSP PERFORMED - Abnormal; Notable for the following:    Benzodiazepines POSITIVE (*)    All other components within normal limits  CBC WITH DIFFERENTIAL/PLATELET - Abnormal; Notable for the following:    Platelets 120 (*)    All other components within normal limits  URINE MICROSCOPIC-ADD ON - Abnormal; Notable for the following:    Squamous Epithelial / LPF 0-5 (*)    Bacteria, UA RARE (*)    All other components within normal limits  AMMONIA - Abnormal; Notable for the following:    Ammonia 40 (*)    All other components within normal limits  I-STAT VENOUS BLOOD GAS, ED - Abnormal; Notable for the following:    pH, Ven 7.358 (*)    Bicarbonate 27.7 (*)    All other components within  normal limits  ETHANOL  VALPROIC ACID LEVEL  BASIC METABOLIC PANEL  TSH  T4, FREE  VITAMIN B1  VITAMIN B12  FOLATE RBC  HIV ANTIBODY (ROUTINE TESTING)  I-STAT TROPOININ, ED    Imaging Review Ct Head Wo Contrast  01/22/2016  CLINICAL DATA:  Found on ground next to his motorcycle.  Confused. EXAM: CT HEAD WITHOUT CONTRAST CT CERVICAL SPINE WITHOUT CONTRAST TECHNIQUE: Multidetector CT imaging of the head and cervical spine was performed following the standard protocol without intravenous contrast. Multiplanar CT image reconstructions of the cervical spine were also generated. COMPARISON:  11/01/2015 FINDINGS: CT HEAD FINDINGS There  is no intracranial hemorrhage, mass or evidence of acute infarction. There is no extra-axial fluid collection. There is mild generalized atrophy. Gray matter and white matter are unremarkable, with normal differentiation. Calvarium and skullbase are intact. There is soft tissue swelling in the right posterior parietal scalp. There is a 4 mm radiopaque foreign body of the skin in this location. Orbits are intact, unremarkable. CT CERVICAL SPINE FINDINGS The vertebral column, pedicles and facet articulations are intact. There is no evidence of acute fracture. No acute soft tissue abnormalities are evident. No significant arthritic changes are evident. IMPRESSION: 1. Negative for acute intracranial traumatic injury. 2. Right posterior parietal scalp soft tissue swelling with a foreign body of the skin 3. Negative for acute cervical spine fracture. Electronically Signed   By: Ellery Plunk M.D.   On: 01/22/2016 20:37   Ct Cervical Spine Wo Contrast  01/22/2016  CLINICAL DATA:  Found on ground next to his motorcycle.  Confused. EXAM: CT HEAD WITHOUT CONTRAST CT CERVICAL SPINE WITHOUT CONTRAST TECHNIQUE: Multidetector CT imaging of the head and cervical spine was performed following the standard protocol without intravenous contrast. Multiplanar CT image reconstructions of  the cervical spine were also generated. COMPARISON:  11/01/2015 FINDINGS: CT HEAD FINDINGS There is no intracranial hemorrhage, mass or evidence of acute infarction. There is no extra-axial fluid collection. There is mild generalized atrophy. Gray matter and white matter are unremarkable, with normal differentiation. Calvarium and skullbase are intact. There is soft tissue swelling in the right posterior parietal scalp. There is a 4 mm radiopaque foreign body of the skin in this location. Orbits are intact, unremarkable. CT CERVICAL SPINE FINDINGS The vertebral column, pedicles and facet articulations are intact. There is no evidence of acute fracture. No acute soft tissue abnormalities are evident. No significant arthritic changes are evident. IMPRESSION: 1. Negative for acute intracranial traumatic injury. 2. Right posterior parietal scalp soft tissue swelling with a foreign body of the skin 3. Negative for acute cervical spine fracture. Electronically Signed   By: Ellery Plunk M.D.   On: 01/22/2016 20:37   Dg Chest Port 1 View  01/22/2016  CLINICAL DATA:  Altered mental status. Found on ground near his motorcycle. EXAM: PORTABLE CHEST 1 VIEW COMPARISON:  03/05/2014 FINDINGS: A single AP portable view of the chest demonstrates no focal airspace consolidation or alveolar edema. The lungs are grossly clear. There is no large effusion or pneumothorax. Cardiac and mediastinal contours appear unremarkable. IMPRESSION: No active disease. Electronically Signed   By: Ellery Plunk M.D.   On: 01/22/2016 19:58   I have personally reviewed and evaluated these images and lab results as part of my medical decision-making.   EKG Interpretation   Date/Time:  Sunday January 22 2016 19:13:44 EDT Ventricular Rate:  83 PR Interval:  164 QRS Duration: 96 QT Interval:  390 QTC Calculation: 458 R Axis:   2 Text Interpretation:  Sinus rhythm Minimal ST elevation, inferior leads  Baseline wander in lead(s) II III  aVF Confirmed by Lincoln Brigham [phone]) on  01/22/2016 7:32:54 PM      MDM   Final diagnoses:  Acute encephalopathy    Patient here for evaluation of altered mental status after being found on the ground. He is persistently lethargic and confused in the department. He has a nonfocal neurologic exam. Plan to admit for further evaluation of this change in mental status.  Tilden Fossa, MD 01/22/16 2330

## 2016-01-23 DIAGNOSIS — G9340 Encephalopathy, unspecified: Secondary | ICD-10-CM | POA: Diagnosis not present

## 2016-01-23 DIAGNOSIS — I10 Essential (primary) hypertension: Secondary | ICD-10-CM

## 2016-01-23 DIAGNOSIS — G40909 Epilepsy, unspecified, not intractable, without status epilepticus: Secondary | ICD-10-CM

## 2016-01-23 DIAGNOSIS — L8990 Pressure ulcer of unspecified site, unspecified stage: Secondary | ICD-10-CM | POA: Diagnosis present

## 2016-01-23 DIAGNOSIS — E119 Type 2 diabetes mellitus without complications: Secondary | ICD-10-CM | POA: Insufficient documentation

## 2016-01-23 LAB — GLUCOSE, CAPILLARY
GLUCOSE-CAPILLARY: 92 mg/dL (ref 65–99)
GLUCOSE-CAPILLARY: 178 mg/dL — AB (ref 65–99)
Glucose-Capillary: 143 mg/dL — ABNORMAL HIGH (ref 65–99)

## 2016-01-23 LAB — BASIC METABOLIC PANEL
Anion gap: 9 (ref 5–15)
BUN: 20 mg/dL (ref 6–20)
CHLORIDE: 104 mmol/L (ref 101–111)
CO2: 25 mmol/L (ref 22–32)
Calcium: 9 mg/dL (ref 8.9–10.3)
Creatinine, Ser: 0.75 mg/dL (ref 0.61–1.24)
GFR calc non Af Amer: >60 mL/min (ref >60)
Glucose, Bld: 147 mg/dL — ABNORMAL HIGH (ref 65–99)
POTASSIUM: 4.4 mmol/L (ref 3.5–5.1)
SODIUM: 138 mmol/L (ref 135–145)

## 2016-01-23 LAB — VITAMIN B12: VITAMIN B 12: 845 pg/mL (ref 180–914)

## 2016-01-23 LAB — T4, FREE: FREE T4: 0.77 ng/dL (ref 0.61–1.12)

## 2016-01-23 LAB — TSH: TSH: 1.972 u[IU]/mL (ref 0.350–4.500)

## 2016-01-23 MED ORDER — SODIUM CHLORIDE 0.9% FLUSH
10.0000 mL | Freq: Two times a day (BID) | INTRAVENOUS | Status: DC
  Administered 2016-01-24: 10 mL

## 2016-01-23 MED ORDER — IPRATROPIUM-ALBUTEROL 0.5-2.5 (3) MG/3ML IN SOLN: 3.0000 mL | RESPIRATORY_TRACT | Status: DC | PRN

## 2016-01-23 MED ORDER — ALBUTEROL SULFATE (2.5 MG/3ML) 0.083% IN NEBU: 2.5000 mg | INHALATION_SOLUTION | RESPIRATORY_TRACT | Status: DC | PRN

## 2016-01-23 NOTE — Care Management Obs Status (Signed)
MEDICARE OBSERVATION STATUS NOTIFICATION   Patient Details  Name: [name] MRN: [id number] Date of Birth: 06/01/1961   Medicare Observation Status Notification Given:  Yes    [name], Annamarie MajorCheryl U, RN 01/23/2016, 11:46 AM

## 2016-01-23 NOTE — Progress Notes (Signed)
New Admission Note:  Arrival Method: Via stretcher with nurse Tech Mental Orientation: Alert and oriented x3 Telemetry: box 20, CCMD notified Assessment: Completed Skin: see doc flowsheet IV: Right AC Pain: see MAR Tubes: N/A Safety Measures: Safety Fall Prevention Plan was given, discussed and signed. Admission: initiated [facility] Orientation: Patient has been orientated to the room, unit and the staff. Family: none at bedside  Orders have been reviewed and implemented. Will continue to monitor the patient. Call light has been placed within reach and bed alarm has been activated.   Tempie DonningMercy Takyah Ciaramitaro BSN, RN  Phone Number: [phone] [facility] [place]

## 2016-01-23 NOTE — Progress Notes (Signed)
PROGRESS NOTE                                                                                                                                                                                                             Patient Demographics:    [name], is a 55 y.o. male, DOB - 11-16-1960, YNW:[id number]  Admit date - 01/22/2016   Admitting Physician Briscoe Deutscherimothy S Opyd, MD  Outpatient Primary MD for the patient is Delbert HarnessBRISCOE, KIM, MD  LOS -   Outpatient Specialists:  Dr.Aquino ( neurology)  Chief Complaint  Patient presents with  . Altered Mental Status       Brief Narrative   55 year old male with history of seizure disorder, anxiety, depression, COPD, hypertension, type 2 diabetes mellitus presented with  confusion and multiple superficial abrasions. On the morning of admission he was at his usual health and was dropped off at a shop to work on his motorcycle in the afternoon. He wasn't answering his wife's phone call by the evening and EMS was called. He was found on the ground in the shop with confusion and scattered superficial abrasions. He is unable to recall the events. Denied chest discomfort, palpitations, abdominal pain, nausea, vomiting or diarrhea. Reports being compliant with his medications and follow-ups. Denies of alcohol or illicit drug use. He was admitted to the hospital in February 2017 with similar symptoms of confusion. Neurology had evaluated him at that time and recommended this was secondary to multiple medications he is on. They have recommended stopping Depakote (due to supratherapeutic level) and switching to Keppra. Also recommended tapering Xanax and stopping trazodone. However patient continued to take Xanax and trazodone. Vitals in the ED were stable. EKG unremarkable. Depakote level therapeutic. Chest x-ray negative for infiltrate or fluid. Labs unremarkable, urine drug screen positive for benzos  only. He did remain confused and somnolent. Head and cervical CT on admission unremarkable except for a small right posterior parietal scalp hematoma. Admitted to telemetry for further management.    Subjective:   Patient reports being tired and sleepy.   Assessment  & Plan :    Principal Problem:   Acute encephalopathy Suspected due to polypharmacy. Patient continues to take 1-2 mg of Xanax 3-4 times a day, continue to take trazodone despite being told to stop it. Patient is somnolent but easily arousable and  oriented. He reports that he has unsteady gait mostly and has been addressed by his neurologist. No seizure-like activity. Depakote was discontinued during prior hospitalization but is still showing up in his home medications. It is also reported that patient is allergic to Keppra (symptoms of anxiety, hyperactivity and agitation). Continue Depakote for now. Level therapeutic. PT evaluation. Continue neuro checks.     Active Problems:   Seizure disorder (HCC) Continue Depakote. Level is therapeutic.     Essential hypertension, benign Stable. Continue lisinopril     Diabetes mellitus, type II (HCC) Continue sliding scale insulin    Depression Continue Celexa. Discontinue trazodone    Generalized anxiety disorder Minimize Xanax. Discontinue trazodone     COPD (chronic obstructive pulmonary disease) (HCC) Continue  Spiriva. Albuterol nebs when necessary   Thrombocytopenia (HCC) Mild and chronic. Stable  Hyperlipidemia Continue Lipitor  Hypothyroidism Continue Synthroid. Check TSH  Positive HCV antibody. Needs referral to outpatient ID.    Code Status : Full code  Family Communication  : None at bedside  Disposition Plan  :  Home in 1-2 days if symptoms improving  Barriers For Discharge :  Ongoing symptoms, needs at least 24 hours monitoring  Consults  :  None  Procedures  : CT head and cervical spine  DVT Prophylaxis  :  Lovenox -   Lab  Results  Component Value Date   PLT 120* 01/22/2016    Antibiotics  :  None  Anti-infectives    None        Objective:   Filed Vitals:   01/23/16 0530 01/23/16 0700 01/23/16 0938 01/23/16 1018  BP: 112/78 127/72 112/60   Pulse: 68 72 69 77  Temp:  97.6 F (36.4 C) 97.6 F (36.4 C)   TempSrc:  Oral Oral   Resp: Height:      Weight:  100.699 kg (222 lb)    SpO2: 96% 100% 98% 94%    Wt Readings from Last 3 Encounters:  01/23/16 100.699 kg (222 lb)  11/03/15 103.7 kg (228 lb 9.9 oz)  03/07/15 103.42 kg (228 lb)     Intake/Output Summary (Last 24 hours) at 01/23/16 1613 Last data filed at 01/23/16 0900  Gross per 24 hour  Intake    240 ml  Output    200 ml  Net     40 ml     Physical Exam  Gen: not in distressSomnolent but easily arousable  HEENT: no pallor, moist mucosa, supple neck, right posterior scalp hematoma  Chest: clear b/l, no added sounds CVS: N S1&S2, no murmurs, rubs or gallop GI: soft, NT, ND, BS+ Musculoskeletal: warm, no edema [id number] but arousable, oriented and nonfocal    Data Review:    CBC  Recent Labs Lab 01/22/16 1736  WBC 7.2  HGB 13.9  HCT 42.3  PLT 120*  MCV 97.0  MCH 31.9  MCHC 32.9  RDW 13.3  LYMPHSABS 2.0  MONOABS 1.0  EOSABS 0.0  BASOSABS 0.0    Chemistries   Recent Labs Lab 01/22/16 1736 01/22/16 2345  NA 141 138  K 4.4 4.4  CL 106 104  CO2 27 25  GLUCOSE 120* 147*  BUN 23* 20  CREATININE 0.86 0.75  CALCIUM 8.9 9.0  AST 33  --   ALT 14*  --   ALKPHOS 44  --   BILITOT 0.6  --    ------------------------------------------------------------------------------------------------------------------ No results for input(s): CHOL, HDL, LDLCALC, TRIG, CHOLHDL, LDLDIRECT in the  last 72 hours.  No results found for: HGBA1C ------------------------------------------------------------------------------------------------------------------  Recent Labs  01/22/16 2345  TSH 1.972    ------------------------------------------------------------------------------------------------------------------  Recent Labs  01/22/16 2345  VITAMINB12 845    Coagulation profile No results for input(s): INR, PROTIME in the last 168 hours.  No results for input(s): DDIMER in the last 72 hours.  Cardiac Enzymes No results for input(s): CKMB, TROPONINI, MYOGLOBIN in the last 168 hours.  Invalid input(s): CK ------------------------------------------------------------------------------------------------------------------ No results found for: BNP  Inpatient Medications  Scheduled Meds: . atorvastatin  40 mg Oral Daily  . citalopram  20 mg Oral Daily  . divalproex  500 mg Oral BID  . enoxaparin (LOVENOX) injection  40 mg Subcutaneous Daily  . insulin aspart  0-9 Units Subcutaneous TID WC  . levothyroxine  150 mcg Oral QAC breakfast  . lisinopril  2.5 mg Oral Daily  . pantoprazole  40 mg Oral Daily  . sodium chloride flush  3 mL Intravenous Q12H  . tiotropium  18 mcg Inhalation Daily   Continuous Infusions:  PRN Meds:.acetaminophen **OR** acetaminophen, albuterol, alprazolam, bisacodyl, HYDROcodone-acetaminophen, ipratropium-albuterol, ondansetron **OR** ondansetron (ZOFRAN) IV, polyethylene glycol  Micro Results No results found for this or any previous visit (from the past 240 hour(s)).  Radiology Reports Ct Head Wo Contrast  01/22/2016  CLINICAL DATA:  Found on ground next to his motorcycle.  Confused. EXAM: CT HEAD WITHOUT CONTRAST CT CERVICAL SPINE WITHOUT CONTRAST TECHNIQUE: Multidetector CT imaging of the head and cervical spine was performed following the standard protocol without intravenous contrast. Multiplanar CT image reconstructions of the cervical spine were also generated. COMPARISON:  11/01/2015 FINDINGS: CT HEAD FINDINGS There is no intracranial hemorrhage, mass or evidence of acute infarction. There is no extra-axial fluid collection. There is mild  generalized atrophy. Gray matter and white matter are unremarkable, with normal differentiation. Calvarium and skullbase are intact. There is soft tissue swelling in the right posterior parietal scalp. There is a 4 mm radiopaque foreign body of the skin in this location. Orbits are intact, unremarkable. CT CERVICAL SPINE FINDINGS The vertebral column, pedicles and facet articulations are intact. There is no evidence of acute fracture. No acute soft tissue abnormalities are evident. No significant arthritic changes are evident. IMPRESSION: 1. Negative for acute intracranial traumatic injury. 2. Right posterior parietal scalp soft tissue swelling with a foreign body of the skin 3. Negative for acute cervical spine fracture. Electronically Signed   By: Ellery Plunk M.D.   On: 01/22/2016 20:37   Ct Cervical Spine Wo Contrast  01/22/2016  CLINICAL DATA:  Found on ground next to his motorcycle.  Confused. EXAM: CT HEAD WITHOUT CONTRAST CT CERVICAL SPINE WITHOUT CONTRAST TECHNIQUE: Multidetector CT imaging of the head and cervical spine was performed following the standard protocol without intravenous contrast. Multiplanar CT image reconstructions of the cervical spine were also generated. COMPARISON:  11/01/2015 FINDINGS: CT HEAD FINDINGS There is no intracranial hemorrhage, mass or evidence of acute infarction. There is no extra-axial fluid collection. There is mild generalized atrophy. Gray matter and white matter are unremarkable, with normal differentiation. Calvarium and skullbase are intact. There is soft tissue swelling in the right posterior parietal scalp. There is a 4 mm radiopaque foreign body of the skin in this location. Orbits are intact, unremarkable. CT CERVICAL SPINE FINDINGS The vertebral column, pedicles and facet articulations are intact. There is no evidence of acute fracture. No acute soft tissue abnormalities are evident. No significant arthritic changes are evident. IMPRESSION: 1. Negative  for  acute intracranial traumatic injury. 2. Right posterior parietal scalp soft tissue swelling with a foreign body of the skin 3. Negative for acute cervical spine fracture. Electronically Signed   By: Ellery Plunk M.D.   On: 01/22/2016 20:37   Dg Chest Port 1 View  01/22/2016  CLINICAL DATA:  Altered mental status. Found on ground near his motorcycle. EXAM: PORTABLE CHEST 1 VIEW COMPARISON:  03/05/2014 FINDINGS: A single AP portable view of the chest demonstrates no focal airspace consolidation or alveolar edema. The lungs are grossly clear. There is no large effusion or pneumothorax. Cardiac and mediastinal contours appear unremarkable. IMPRESSION: No active disease. Electronically Signed   By: Ellery Plunk M.D.   On: 01/22/2016 19:58    Time Spent in minutes  25   Eddie North M.D on 01/23/2016 at 4:13 PM  Between 7am to 7pm - Pager - [phone]  After 7pm go to [email or web address] - password [facility]  [facility] -  Office  [phone]

## 2016-01-23 NOTE — ED Notes (Signed)
Patient given a turkey sandwich and sprite  

## 2016-01-23 NOTE — ED Notes (Signed)
Ordered patient's  Breakfast.

## 2016-01-24 DIAGNOSIS — E039 Hypothyroidism, unspecified: Secondary | ICD-10-CM | POA: Diagnosis not present

## 2016-01-24 DIAGNOSIS — I10 Essential (primary) hypertension: Secondary | ICD-10-CM | POA: Diagnosis not present

## 2016-01-24 DIAGNOSIS — G928 Other toxic encephalopathy: Secondary | ICD-10-CM | POA: Diagnosis present

## 2016-01-24 DIAGNOSIS — F329 Major depressive disorder, single episode, unspecified: Secondary | ICD-10-CM | POA: Diagnosis not present

## 2016-01-24 DIAGNOSIS — G9340 Encephalopathy, unspecified: Secondary | ICD-10-CM | POA: Diagnosis not present

## 2016-01-24 DIAGNOSIS — G92 Toxic encephalopathy: Secondary | ICD-10-CM

## 2016-01-24 LAB — GLUCOSE, CAPILLARY
GLUCOSE-CAPILLARY: 137 mg/dL — AB (ref 65–99)
Glucose-Capillary: 180 mg/dL — ABNORMAL HIGH (ref 65–99)
Glucose-Capillary: 132 mg/dL — ABNORMAL HIGH (ref 65–99)

## 2016-01-24 LAB — HIV ANTIBODY (ROUTINE TESTING W REFLEX): HIV SCREEN 4TH GENERATION: NONREACTIVE

## 2016-01-24 LAB — TSH: TSH: 2.539 u[IU]/mL (ref 0.350–4.500)

## 2016-01-24 MED ORDER — TRAZODONE HCL 50 MG PO TABS: 50.0000 mg | ORAL_TABLET | Freq: Every evening | ORAL | Status: DC | PRN

## 2016-01-24 NOTE — Discharge Summary (Signed)
Physician Discharge Summary  [name] ZOX:[id number] DOB: Jan 30, 1961 DOA: 01/22/2016  PCP: Delbert Harness, MD  Admit date: 01/22/2016 Discharge date: 01/24/2016  Time spent: 25 minutes  Recommendations for Outpatient Follow-up:  1. Discharge home with PCP follow-up in one week. 2. Please refer to ID as outpatient for positive HCV ab.   Discharge Diagnoses:  Principal Problem: Toxic metabolic encephalopathy   Active Problems:   Seizure disorder (HCC)   Essential hypertension, benign   Diabetes (HCC)   Depression   Generalized anxiety disorder   Hypothyroidism   COPD (chronic obstructive pulmonary disease) (HCC)   Thrombocytopenia (HCC)   Pressure ulcer   Discharge Condition: Fair  Diet recommendation: Diabetic  Filed Weights   01/22/16 1905 01/23/16 0700 01/23/16 2135  Weight: 99.791 kg (220 lb) 100.699 kg (222 lb) 103.193 kg (227 lb 8 oz)    History of present illness:  55 year old male with history of seizure disorder, anxiety, depression, COPD, hypertension, type 2 diabetes mellitus presented with confusion and multiple superficial abrasions. On the morning of admission he was at his usual health and was dropped off at a shop to work on his motorcycle in the afternoon. He wasn't answering his wife's phone call by the evening and EMS was called. He was found on the ground in the shop with confusion and scattered superficial abrasions. He is unable to recall the events. Denied chest discomfort, palpitations, abdominal pain, nausea, vomiting or diarrhea. Reports being compliant with his medications and follow-ups. Denies of alcohol or illicit drug use. He was admitted to the hospital in February 2017 with similar symptoms of confusion. Neurology had evaluated him at that time and recommended this was secondary to multiple medications he is on. Had  recommended tapering Xanax and stopping trazodone. However patient continued to take Xanax and trazodone at higher  dose. Vitals in the ED were stable. EKG unremarkable. Depakote level therapeutic. Chest x-ray negative for infiltrate or fluid. Labs unremarkable, urine drug screen positive for benzos only. He did remain confused and somnolent. Head and cervical CT on admission unremarkable except for a small right posterior parietal scalp hematoma. Admitted to telemetry for further management.    Hospital Course:  Principal Problem:  Acute metabolic encephalopathy Suspected due to polypharmacy. Patient continues to take 1-2 mg of Xanax 3-4 times a day for anxiety, continue to take trazodone despite being told to stop it. -Mental status at baseline now. He reports that he has unsteady gait mostly and has been addressed by his neurologist. No seizure-like activity. TSH, B12 normal. Mildly elevated ammonia level. HIV antibody negative. Urine drug positive for benzodiazepines only. -Patient was recommended to stop Depakote and switched to Keppra dating last hospitalization but could not tolerate it as it made him very anxious and agitated. His neurologist and switched him back to Depakote. Wife is concerned that patient has fall risk and is trying to be with him as much possible. I encouraged him to continue using his walker and would arrange for home health RN and PT. I have instructed patient and his wife to reduce dose of Xanax to 0.5 mg twice a day as needed only. Also reduced his dose of trazodone (takes for sleep) 50 mg at bedtime only as needed. Both these medications are likely causing drug interactions and aggravating his symptoms. Patient should follow-up with his PCP and neurologist     Active Problems:  Seizure disorder (HCC) Continue Depakote. Level is therapeutic.    Essential hypertension, benign Stable. Continue lisinopril  Diabetes mellitus, type II (HCC) Resume metformin.   Depression Continue Celexa.    Generalized anxiety disorder Minimize Xanax. Discontinue trazodone  if possible. I have reduced the dose.    COPD (chronic obstructive pulmonary disease) (HCC) Continue Spiriva.    Thrombocytopenia (HCC) Mild and chronic. Stable  Hyperlipidemia Continue Lipitor  Hypothyroidism Continue Synthroid. TSH normal.  Positive HCV antibody. Needs referral to outpatient ID.    Code Status : Full code  Family Communication : Wife at bedside  Disposition Plan :  Home with home health RN and PT    Consults : None  Procedures : CT head and cervical spine  Discharge Exam: Filed Vitals:   01/24/16 0213 01/24/16 0802  BP: 125/81 131/65  Pulse:  75  Temp: 97.6 F (36.4 C) 98.5 F (36.9 C)  Resp: 18 19    Gen: not in distress. HEENT: no pallor, moist mucosa, supple neck, small right posterior scalp hematoma  Chest: clear b/l, no added sounds CVS: N S1&S2, no murmurs, rubs or gallop GI: soft, NT, ND, BS+ Musculoskeletal: warm, no edema CNS: Alert and oriented, has slow speech, nonfocal  Discharge Instructions    Current Discharge Medication List    CONTINUE these medications which have CHANGED   Details  traZODone (DESYREL) 50 MG tablet Take 1 tablet (50 mg total) by mouth at bedtime as needed for sleep. Qty: 20 tablet, Refills: 0      CONTINUE these medications which have NOT CHANGED   Details  albuterol (PROVENTIL HFA;VENTOLIN HFA) 108 (90 BASE) MCG/ACT inhaler Inhale 2 puffs into the lungs every 4 (four) hours as needed for wheezing or shortness of breath.    alprazolam (XANAX) 2 MG tablet Take 0.5 tablets (1 mg total) by mouth 2 (two) times daily. Qty: 30 tablet, Refills: 0    atorvastatin (LIPITOR) 40 MG tablet Take 40 mg by mouth daily.    citalopram (CELEXA) 20 MG tablet Take 20 mg by mouth daily.     divalproex (DEPAKOTE) 250 MG DR tablet Take 500 mg by mouth 2 (two) times daily.    ketoconazole (NIZORAL) 2 % cream Apply 1 application topically daily.    levothyroxine (SYNTHROID, LEVOTHROID) 150 MCG tablet  Take 150 mcg by mouth daily before breakfast.     lisinopril (PRINIVIL,ZESTRIL) 2.5 MG tablet Take 2.5 mg by mouth daily.    metformin (FORTAMET) 500 MG (OSM) 24 hr tablet Take 500 mg by mouth 2 (two) times daily.    omeprazole (PRILOSEC) 20 MG capsule Take 20 mg by mouth daily.    tiotropium (SPIRIVA) 18 MCG inhalation capsule Place 18 mcg into inhaler and inhale daily.      STOP taking these medications     levETIRAcetam (KEPPRA) 500 MG tablet        Allergies  Allergen Reactions  . Levetiracetam Other (See Comments)    Severe anxiety, agitation, hyperactivity, very negative disposition  . Tegretol [Carbamazepine] Anxiety and Other (See Comments)    Increase seizures/ Induces seizures  . Penicillins Other (See Comments)    Reaction unknown  . Topiramate Other (See Comments)   Follow-up Information    Follow up with Delbert Harness, MD In 1 week.   Specialty:  Family Medicine   Contact information:   [address] [place] [address] [phone]        The results of significant diagnostics from this hospitalization (including imaging, microbiology, ancillary and laboratory) are listed below for reference.    Significant Diagnostic  Studies: Ct Head Wo Contrast  01/22/2016  CLINICAL DATA:  Found on ground next to his motorcycle.  Confused. EXAM: CT HEAD WITHOUT CONTRAST CT CERVICAL SPINE WITHOUT CONTRAST TECHNIQUE: Multidetector CT imaging of the head and cervical spine was performed following the standard protocol without intravenous contrast. Multiplanar CT image reconstructions of the cervical spine were also generated. COMPARISON:  11/01/2015 FINDINGS: CT HEAD FINDINGS There is no intracranial hemorrhage, mass or evidence of acute infarction. There is no extra-axial fluid collection. There is mild generalized atrophy. Gray matter and white matter are unremarkable, with normal differentiation. Calvarium and skullbase are intact. There is soft tissue  swelling in the right posterior parietal scalp. There is a 4 mm radiopaque foreign body of the skin in this location. Orbits are intact, unremarkable. CT CERVICAL SPINE FINDINGS The vertebral column, pedicles and facet articulations are intact. There is no evidence of acute fracture. No acute soft tissue abnormalities are evident. No significant arthritic changes are evident. IMPRESSION: 1. Negative for acute intracranial traumatic injury. 2. Right posterior parietal scalp soft tissue swelling with a foreign body of the skin 3. Negative for acute cervical spine fracture. Electronically Signed   By: Ellery Plunkaniel R Mitchell M.D.   On: 01/22/2016 20:37   Ct Cervical Spine Wo Contrast  01/22/2016  CLINICAL DATA:  Found on ground next to his motorcycle.  Confused. EXAM: CT HEAD WITHOUT CONTRAST CT CERVICAL SPINE WITHOUT CONTRAST TECHNIQUE: Multidetector CT imaging of the head and cervical spine was performed following the standard protocol without intravenous contrast. Multiplanar CT image reconstructions of the cervical spine were also generated. COMPARISON:  11/01/2015 FINDINGS: CT HEAD FINDINGS There is no intracranial hemorrhage, mass or evidence of acute infarction. There is no extra-axial fluid collection. There is mild generalized atrophy. Gray matter and white matter are unremarkable, with normal differentiation. Calvarium and skullbase are intact. There is soft tissue swelling in the right posterior parietal scalp. There is a 4 mm radiopaque foreign body of the skin in this location. Orbits are intact, unremarkable. CT CERVICAL SPINE FINDINGS The vertebral column, pedicles and facet articulations are intact. There is no evidence of acute fracture. No acute soft tissue abnormalities are evident. No significant arthritic changes are evident. IMPRESSION: 1. Negative for acute intracranial traumatic injury. 2. Right posterior parietal scalp soft tissue swelling with a foreign body of the skin 3. Negative for acute  cervical spine fracture. Electronically Signed   By: Ellery Plunkaniel R Mitchell M.D.   On: 01/22/2016 20:37   Dg Chest Port 1 View  01/22/2016  CLINICAL DATA:  Altered mental status. Found on ground near his motorcycle. EXAM: PORTABLE CHEST 1 VIEW COMPARISON:  03/05/2014 FINDINGS: A single AP portable view of the chest demonstrates no focal airspace consolidation or alveolar edema. The lungs are grossly clear. There is no large effusion or pneumothorax. Cardiac and mediastinal contours appear unremarkable. IMPRESSION: No active disease. Electronically Signed   By: Ellery Plunkaniel R Mitchell M.D.   On: 01/22/2016 19:58    Microbiology: No results found for this or any previous visit (from the past 240 hour(s)).   Labs: Basic Metabolic Panel:  Recent Labs Lab 01/22/16 1736 01/22/16 2345  NA 141 138  K 4.4 4.4  CL 106 104  CO2 27 25  GLUCOSE 120* 147*  BUN 23* 20  CREATININE 0.86 0.75  CALCIUM 8.9 9.0   Liver Function Tests:  Recent Labs Lab 01/22/16 1736  AST 33  ALT 14*  ALKPHOS 44  BILITOT 0.6  PROT 6.7  ALBUMIN 3.5  No results for input(s): LIPASE, AMYLASE in the last 168 hours.  Recent Labs Lab 01/22/16 2134  AMMONIA 40*   CBC:  Recent Labs Lab 01/22/16 1736  WBC 7.2  NEUTROABS 4.1  HGB 13.9  HCT 42.3  MCV 97.0  PLT 120*   Cardiac Enzymes: No results for input(s): CKTOTAL, CKMB, CKMBINDEX, TROPONINI in the last 168 hours. BNP: BNP (last 3 results) No results for input(s): BNP in the last 8760 hours.  ProBNP (last 3 results) No results for input(s): PROBNP in the last 8760 hours.  CBG:  Recent Labs Lab 01/23/16 0741 01/23/16 1133 01/23/16 1649 01/23/16 2107 01/24/16 0800  GLUCAP 92 178* 143* 137* 180*       Signed:  Eddie North MD.  [facility] 01/24/2016, 11:08 AM

## 2016-01-24 NOTE — Care Management Note (Signed)
Case Management Note  Patient Details  Name: [name] MRN: [id number] Date of Birth: 1961/03/17  Subjective/Objective:          CM following for progression and d/c planning.          Action/Plan: 01/24/2016 Met with pt and wife on 01/23/16 re OBS status on 01/24/2016 re [facility] services. Pt has used [facility] in the past and wishes to use these services again. [facility] notified.   Expected Discharge Date:     01/24/2016             Expected Discharge Plan:  [place]  In-House Referral:  NA  Discharge planning Services  CM Consult  Post Acute Care Choice:  Home Health Choice offered to:  Spouse  DME Arranged:  N/A DME Agency:  NA  HH Arranged:  RN, PT [place] Agency:  [place]  Status of Service:  Completed, signed off  Medicare Important Message Given:    Date Medicare IM Given:    Medicare IM give by:    Date Additional Medicare IM Given:    Additional Medicare Important Message give by:     If discussed at [place] of Stay Meetings, dates discussed:    Additional Comments:  Adron Bene, RN 01/24/2016, 12:13 PM

## 2016-01-24 NOTE — Progress Notes (Signed)
Pt being discharged home via wheelchair with family. Pt alert and oriented x4. VSS. Pt c/o no pain at this time. No signs of respiratory distress. Education complete and care plans resolved. Midline IV removed via IV team, no issues noted. No further issues at this time. Pt to follow up with PCP. Jillyn HiddenStone,Caley Volkert R, RN

## 2016-01-25 LAB — VITAMIN B1: VITAMIN B1 (THIAMINE): 196.1 nmol/L (ref 66.5–200.0)

## 2019-04-19 ENCOUNTER — Encounter (HOSPITAL_COMMUNITY): Payer: Self-pay | Admitting: Emergency Medicine

## 2019-04-19 ENCOUNTER — Emergency Department (HOSPITAL_COMMUNITY)
Admission: EM | Admit: 2019-04-19 | Discharge: 2019-04-19 | Disposition: A | Discharge status: Home / Self Care | Payer: Medicare Other | Attending: Emergency Medicine | Admitting: Emergency Medicine

## 2019-04-19 ENCOUNTER — Other Ambulatory Visit: Payer: Self-pay

## 2019-04-19 ENCOUNTER — Emergency Department (HOSPITAL_COMMUNITY): Payer: Medicare Other

## 2019-04-19 DIAGNOSIS — E119 Type 2 diabetes mellitus without complications: Secondary | ICD-10-CM | POA: Diagnosis not present

## 2019-04-19 DIAGNOSIS — R1011 Right upper quadrant pain: Secondary | ICD-10-CM | POA: Insufficient documentation

## 2019-04-19 DIAGNOSIS — I10 Essential (primary) hypertension: Secondary | ICD-10-CM | POA: Insufficient documentation

## 2019-04-19 DIAGNOSIS — F17210 Nicotine dependence, cigarettes, uncomplicated: Secondary | ICD-10-CM | POA: Insufficient documentation

## 2019-04-19 DIAGNOSIS — R1033 Periumbilical pain: Secondary | ICD-10-CM | POA: Diagnosis not present

## 2019-04-19 DIAGNOSIS — E039 Hypothyroidism, unspecified: Secondary | ICD-10-CM | POA: Diagnosis not present

## 2019-04-19 DIAGNOSIS — R109 Unspecified abdominal pain: Secondary | ICD-10-CM

## 2019-04-19 DIAGNOSIS — Z79899 Other long term (current) drug therapy: Secondary | ICD-10-CM | POA: Insufficient documentation

## 2019-04-19 LAB — COMPREHENSIVE METABOLIC PANEL
ALT: 16 U/L (ref 0–44)
AST: 17 U/L (ref 15–41)
Albumin: 4 g/dL (ref 3.5–5.0)
Alkaline Phosphatase: 66 U/L (ref 38–126)
Anion gap: 11 (ref 5–15)
BUN: 17 mg/dL (ref 6–20)
CO2: 23 mmol/L (ref 22–32)
Calcium: 9.3 mg/dL (ref 8.9–10.3)
Chloride: 96 mmol/L — ABNORMAL LOW (ref 98–111)
Creatinine, Ser: 0.83 mg/dL (ref 0.61–1.24)
GFR calc Af Amer: >60 mL/min (ref >60)
GFR calc non Af Amer: >60 mL/min (ref >60)
Glucose, Bld: 101 mg/dL — ABNORMAL HIGH (ref 70–99)
Potassium: 4.6 mmol/L (ref 3.5–5.1)
Sodium: 130 mmol/L — ABNORMAL LOW (ref 135–145)
Total Bilirubin: 0.5 mg/dL (ref 0.3–1.2)
Total Protein: 7.2 g/dL (ref 6.5–8.1)

## 2019-04-19 LAB — CBC
HCT: 45.9 % (ref 39.0–52.0)
Hemoglobin: 15.6 g/dL (ref 13.0–17.0)
MCH: 31.3 pg (ref 26.0–34.0)
MCHC: 34 g/dL (ref 30.0–36.0)
MCV: 92.2 fL (ref 80.0–100.0)
Platelets: 215 10*3/uL (ref 150–400)
RBC: 4.98 MIL/uL (ref 4.22–5.81)
RDW: 13.7 % (ref 11.5–15.5)
WBC: 8.8 10*3/uL (ref 4.0–10.5)
nRBC: 0 % (ref 0.0–0.2)

## 2019-04-19 LAB — URINALYSIS, ROUTINE W REFLEX MICROSCOPIC
Bilirubin Urine: NEGATIVE
Glucose, UA: NEGATIVE mg/dL
Hgb urine dipstick: NEGATIVE
Ketones, ur: NEGATIVE mg/dL
Leukocytes,Ua: NEGATIVE
Nitrite: NEGATIVE
Protein, ur: NEGATIVE mg/dL
Specific Gravity, Urine: 1.016 (ref 1.005–1.030)
pH: 7 (ref 5.0–8.0)

## 2019-04-19 LAB — LIPASE, BLOOD: Lipase: 25 U/L (ref 11–51)

## 2019-04-19 MED ORDER — MORPHINE SULFATE (PF) 4 MG/ML IV SOLN
4.0000 mg | Freq: Once | INTRAVENOUS | Status: AC
  Administered 2019-04-19: 4 mg via INTRAVENOUS
  Filled 2019-04-19: qty 1

## 2019-04-19 MED ORDER — SODIUM CHLORIDE 0.9% FLUSH
3.0000 mL | Freq: Once | INTRAVENOUS | Status: AC
  Administered 2019-04-19: 3 mL via INTRAVENOUS

## 2019-04-19 MED ORDER — IOHEXOL 350 MG/ML SOLN: 100.0000 mL | Freq: Once | INTRAVENOUS | Status: DC | PRN

## 2019-04-19 MED ORDER — DOXYCYCLINE HYCLATE 100 MG PO CAPS: 100.0000 mg | ORAL_CAPSULE | Freq: Two times a day (BID) | ORAL | 0 refills | Status: AC

## 2019-04-19 MED ORDER — IOHEXOL 300 MG/ML  SOLN
100.0000 mL | Freq: Once | INTRAMUSCULAR | Status: AC | PRN
  Administered 2019-04-19: 100 mL via INTRAVENOUS

## 2019-04-19 NOTE — ED Provider Notes (Signed)
[facility] Provider Note   CSN: [id number] Arrival date & time: 04/19/19  1811    History   Chief Complaint Chief Complaint  Patient presents with  . Abdominal Pain  . Flank Pain    HPI Winferd HumphreyGeorge Hurtado is a [age] y.o. male with past medical history of diabetes, GERD, hypertension, status post appendectomy, COPD, presenting to the emergency department with 1.5 weeks of right mid abdominal pain that has been gradually worsening.  Pain radiates to his back and is worse with laying flat.  Pain has no relation to meals.  He states initially he felt constipated and took MiraLAX and had multiple large bowel movements.  He states that did not provide any relief of his pain.  He has a little bit of nausea without vomiting and some urinary urgency.  He denies associated fever, new cough, diarrhea, dysuria, urinary frequency, pain with bowel movements.  He does not drink alcohol. He does note he got bitten by a deer tick over a week ago in his umbilicus, he removed the tick in whole and hasn't had any rashes or fevers.     The history is provided by the patient.    Past Medical History:  Diagnosis Date  . Anemia   . Depression   . Diabetes mellitus without complication (HCC)   . Emphysema of lung (HCC)   . GERD (gastroesophageal reflux disease)   . Hiatal hernia   . Hyperlipidemia   . Hypertension   . Memory loss   . Seizures (HCC)   . Thyroid disease     Patient Active Problem List   Diagnosis Date Noted  . Toxic metabolic encephalopathy 01/24/2016  . Pressure ulcer 01/23/2016  . Non-insulin dependent type 2 diabetes mellitus (HCC)   . Altered mental status 01/22/2016  . COPD (chronic obstructive pulmonary disease) (HCC) 01/22/2016  . Thrombocytopenia (HCC) 01/22/2016  . Acute encephalopathy 11/01/2015  . Chronic daily headache 03/08/2015  . Localization-related symptomatic 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,  intractable, without status epilepticus (HCC) 03/07/2015  . Memory loss 03/07/2015  . Seizure disorder (HCC) 03/05/2014  . Other and unspecified hyperlipidemia 03/05/2014  . Essential hypertension, benign 03/05/2014  . Diabetes (HCC) 03/05/2014  . Depression 03/05/2014  . Generalized anxiety disorder 03/05/2014  . Hypothyroidism 03/05/2014  . Poor dentition 03/05/2014    Past Surgical History:  Procedure Laterality Date  . APPENDECTOMY    . DENTAL SURGERY    . EYE SURGERY    . TONSILLECTOMY AND ADENOIDECTOMY          Home Medications    Prior to Admission medications   Medication Sig Start Date End Date Taking? Authorizing Provider  albuterol (PROVENTIL HFA;VENTOLIN HFA) 108 (90 BASE) MCG/ACT inhaler Inhale 2 puffs into the lungs every 4 (four) hours as needed for wheezing or shortness of breath.    [provider]  alprazolam Prudy Feeler(XANAX) 2 MG tablet Take 0.5 tablets (1 mg total) by mouth 2 (two) times daily. 11/03/15   Gwynn BurlyWallace, Andrew, DO  atorvastatin (LIPITOR) 40 MG tablet Take 40 mg by mouth daily.    [provider]  citalopram (CELEXA) 20 MG tablet Take 20 mg by mouth daily.  02/25/15   [provider]  divalproex (DEPAKOTE) 250 MG DR tablet Take 500 mg by mouth 2 (two) times daily. 03/23/14   [provider]  doxycycline (VIBRAMYCIN) 100 MG capsule Take 1 capsule (100 mg total) by mouth 2 (two) times daily for  7 days. 04/19/19 04/26/19  Marianna Cid, [place] N, PA-C  ketoconazole (NIZORAL) 2 % cream Apply 1 application topically daily.    [provider]  levothyroxine (SYNTHROID, LEVOTHROID) 150 MCG tablet Take 150 mcg by mouth daily before breakfast.  12/30/14   [provider]  lisinopril (PRINIVIL,ZESTRIL) 2.5 MG tablet Take 2.5 mg by mouth daily. 05/09/15   [provider]  omeprazole (PRILOSEC) 20 MG capsule Take 20 mg by mouth daily.    [provider]  tiotropium (SPIRIVA) 18 MCG inhalation capsule Place 18 mcg  into inhaler and inhale daily.    [provider]  traZODone (DESYREL) 50 MG tablet Take 1 tablet (50 mg total) by mouth at bedtime as needed for sleep. 01/24/16   Dhungel, Theda BelfastNishant, MD    Family History Family History  Problem Relation Age of Onset  . Stroke Mother   . Heart disease Father   . Thyroid disease Father   . Thyroid disease Brother     Social History Social History   Tobacco Use  . Smoking status: Current Every Day Smoker    Years: 40.00    Types: E-cigarettes, Cigarettes  . Smokeless tobacco: Never Used  . Tobacco comment: 11/01/2015 "did smoke 3 ppd; he's been vaping for 4 years; no nicotine for the past year; smoked a cigarette 2 days ago"  Substance Use Topics  . Alcohol use: No    Alcohol/week: 0.0 standard drinks  . Drug use: No     Allergies   Levetiracetam, Tegretol [carbamazepine], Penicillins, and Topiramate   Review of Systems Review of Systems  Constitutional: Negative for fever.  Respiratory: Negative for cough and shortness of breath.   Gastrointestinal: Positive for abdominal pain, constipation and nausea. Negative for diarrhea and vomiting.  Genitourinary: Negative for dysuria, frequency and hematuria.  Musculoskeletal: Positive for back pain.  All other systems reviewed and are negative.    Physical Exam Updated Vital Signs BP (!) 189/90 (BP Location: Right Arm)   Pulse (!) 58   Temp 98.7 F (37.1 C) (Oral)   Resp 13   SpO2 96%   Physical Exam Vitals signs and nursing note reviewed.  Constitutional:      General: He is not in acute distress.    Appearance: He is well-developed.  HENT:     Head: Normocephalic and atraumatic.  Eyes:     Conjunctiva/sclera: Conjunctivae normal.  Cardiovascular:     Rate and Rhythm: Normal rate and regular rhythm.  Pulmonary:     Effort: Pulmonary effort is normal.     Breath sounds: Normal breath sounds.  Abdominal:     General: Bowel sounds are normal.     Palpations: Abdomen is  soft.     Tenderness: There is abdominal tenderness in the right upper quadrant and periumbilical area. There is right CVA tenderness. There is no guarding. Negative signs include Murphy's sign.  Skin:    General: Skin is warm.     Comments: Small punctate area of redness inside umbilicus. No target lesion. No pustule/vesicle, no drainage.  nontender  Neurological:     Mental Status: He is alert.  Psychiatric:        Behavior: Behavior normal.      ED Treatments / Results  Labs (all labs ordered are listed, but only abnormal results are displayed) Labs Reviewed  COMPREHENSIVE METABOLIC PANEL - Abnormal; Notable for the following components:      Result Value   Sodium 130 (*)    Chloride 96 (*)  Glucose, Bld 101 (*)    All other components within normal limits  LIPASE, BLOOD  CBC  URINALYSIS, ROUTINE W REFLEX MICROSCOPIC    EKG None  Radiology Ct Abdomen Pelvis W Contrast  Result Date: 04/19/2019 CLINICAL DATA:  Right-sided abdominal pain radiating to back, dysuria. Prior appendectomy. EXAM: CT ABDOMEN AND PELVIS WITH CONTRAST TECHNIQUE: Multidetector CT imaging of the abdomen and pelvis was performed using the standard protocol following bolus administration of intravenous contrast. CONTRAST:  100mL OMNIPAQUE IOHEXOL 300 MG/ML  SOLN COMPARISON:  07/30/2003 FINDINGS: Lower chest: Lung bases are clear. Hepatobiliary: Liver is within normal limits. Gallbladder is unremarkable. No intrahepatic or extrahepatic ductal dilatation. Pancreas: Within normal limits. Spleen: Within normal limits. Adrenals/Urinary Tract: Adrenal glands are within normal limits. Kidneys are within normal limits.  No hydronephrosis. Bladder is within normal limits. Stomach/Bowel: Stomach is within normal limits. No evidence of bowel obstruction. Prior appendectomy. No colonic wall thickening or inflammatory changes. Vascular/Lymphatic: No evidence of abdominal aortic aneurysm. Atherosclerotic calcifications of  the abdominal aorta and branch vessels. No suspicious abdominopelvic lymphadenopathy. Reproductive: Prostate is unremarkable. Other: No abdominopelvic ascites. No evidence of inguinal or ventral hernia. Musculoskeletal: Very mild degenerative changes of the lower thoracic spine. IMPRESSION: No evidence of bowel obstruction.  Prior appendectomy. No CT findings to account for the patient's right abdominal pain. Electronically Signed   By: Charline BillsSriyesh  Krishnan M.D.   On: 04/19/2019 20:38    Procedures Procedures (including critical care time)  Medications Ordered in ED Medications  sodium chloride flush (NS) 0.9 % injection 3 mL (3 mLs Intravenous Given 04/19/19 2007)  morphine 4 MG/ML injection 4 mg (4 mg Intravenous Given 04/19/19 2006)  iohexol (OMNIPAQUE) 300 MG/ML solution 100 mL (100 mLs Intravenous Contrast Given 04/19/19 2020)     Initial Impression / Assessment and Plan / ED Course  I have reviewed the triage vital signs and the nursing notes.  Pertinent labs & imaging results that were available during my care of the patient were reviewed by me and considered in my medical decision making (see chart for details).        Patient presenting with gradual onset of right sided abdominal pain that began 1.5 weeks ago. Associated symptoms include mild intermittent nausea. Patient is nontoxic, nonseptic appearing, in no apparent distress. Abdomen is soft with mild tenderness in the right mid abdomen and upper quadrant, no guarding or rebound.   Neg murphy. Labs, CT abd and vitals reviewed. Slight hyponatremia replaced with IVF. No leukocytosis.  Normal renal and hepatic function.  Normal lipase.  Nl white count.  CT scan is negative for intra-abdominal cause of patient's symptoms.  Patient does not meet the SIRS or Sepsis criteria.    patient's pain and other symptoms adequately managed in emergency department.  Discussed with Dr. Silverio LayYao.  Patient tick bite and lack of rash or systemic symptoms is  unlikely cause of his belly pain.  Will prescribe prescription of doxycycline for patient to use only if he develops a rash localized to the tick site.  Patient discharged home with symptomatic treatment and given strict instructions for follow-up with their primary care physician.  Pt safe for discharge.   Discussed results, findings, treatment and follow up. Patient advised of return precautions. Patient verbalized understanding and agreed with plan.  Final Clinical Impressions(s) / ED Diagnoses   Final diagnoses:  Right sided abdominal pain    ED Discharge Orders         Ordered    doxycycline (  VIBRAMYCIN) 100 MG capsule  2 times daily     04/19/19 2153           Kayelee Herbig, [place] N, PA-C 04/19/19 2202    Drenda Freeze, MD 04/21/19 [phone]

## 2019-04-19 NOTE — ED Notes (Signed)
Patient transported to CT 

## 2019-04-19 NOTE — Discharge Instructions (Addendum)
Your labs and CT scan are reassuring today. It is unlikely that your tick bite is causing your abdominal pain. The site where you were bitten looks normal today. I have prescribed you an antibiotic to fill and take ONLY if you develop a rash that starts at the tick bite and is expanding. You do not need to fill this prescription unless you develop such a rash. Follow up closely with your primary care provider to follow up on your visit today. Return to the ER if you develop severely worsening abdominal pain, fever, uncontrollable vomiting, or new or concerning symptoms.

## 2019-04-19 NOTE — ED Triage Notes (Addendum)
Pt here for eval of abdominal pain radiating into the back on right side. Pt took milk of magnesia for constipation and had a large BM after. Pain is worse when lying flat. Some pain with urination. No N/V.

## 2019-04-19 NOTE — ED Notes (Signed)
Patient verbalizes understanding of discharge instructions. Opportunity for questioning and answers were provided. Armband removed by staff, pt discharged from ED.  

## 2021-08-14 ENCOUNTER — Emergency Department (HOSPITAL_COMMUNITY)
Admission: EM | Admit: 2021-08-14 | Discharge: 2021-08-14 | Disposition: A | Discharge status: Home / Self Care | Payer: Medicare (Managed Care) | Attending: Emergency Medicine | Admitting: Emergency Medicine

## 2021-08-14 ENCOUNTER — Emergency Department (HOSPITAL_COMMUNITY): Payer: Medicare (Managed Care)

## 2021-08-14 ENCOUNTER — Encounter (HOSPITAL_COMMUNITY): Payer: Self-pay | Admitting: Emergency Medicine

## 2021-08-14 DIAGNOSIS — S8012XA Contusion of left lower leg, initial encounter: Secondary | ICD-10-CM | POA: Diagnosis not present

## 2021-08-14 DIAGNOSIS — E871 Hypo-osmolality and hyponatremia: Secondary | ICD-10-CM | POA: Insufficient documentation

## 2021-08-14 DIAGNOSIS — S8992XA Unspecified injury of left lower leg, initial encounter: Secondary | ICD-10-CM | POA: Diagnosis present

## 2021-08-14 DIAGNOSIS — E119 Type 2 diabetes mellitus without complications: Secondary | ICD-10-CM | POA: Insufficient documentation

## 2021-08-14 DIAGNOSIS — T148XXA Other injury of unspecified body region, initial encounter: Secondary | ICD-10-CM

## 2021-08-14 DIAGNOSIS — W228XXA Striking against or struck by other objects, initial encounter: Secondary | ICD-10-CM | POA: Diagnosis not present

## 2021-08-14 DIAGNOSIS — Z79899 Other long term (current) drug therapy: Secondary | ICD-10-CM | POA: Diagnosis not present

## 2021-08-14 DIAGNOSIS — I10 Essential (primary) hypertension: Secondary | ICD-10-CM | POA: Diagnosis not present

## 2021-08-14 DIAGNOSIS — L02416 Cutaneous abscess of left lower limb: Secondary | ICD-10-CM | POA: Diagnosis not present

## 2021-08-14 DIAGNOSIS — F17210 Nicotine dependence, cigarettes, uncomplicated: Secondary | ICD-10-CM | POA: Insufficient documentation

## 2021-08-14 DIAGNOSIS — E039 Hypothyroidism, unspecified: Secondary | ICD-10-CM | POA: Diagnosis not present

## 2021-08-14 DIAGNOSIS — J449 Chronic obstructive pulmonary disease, unspecified: Secondary | ICD-10-CM | POA: Insufficient documentation

## 2021-08-14 DIAGNOSIS — Z7951 Long term (current) use of inhaled steroids: Secondary | ICD-10-CM | POA: Insufficient documentation

## 2021-08-14 LAB — COMPREHENSIVE METABOLIC PANEL
ALT: 10 U/L (ref 0–44)
AST: 20 U/L (ref 15–41)
Albumin: 3.8 g/dL (ref 3.5–5.0)
Alkaline Phosphatase: 48 U/L (ref 38–126)
Anion gap: 7 (ref 5–15)
BUN: 17 mg/dL (ref 8–23)
CO2: 35 mmol/L — ABNORMAL HIGH (ref 22–32)
Calcium: 9.5 mg/dL (ref 8.9–10.3)
Chloride: 90 mmol/L — ABNORMAL LOW (ref 98–111)
Creatinine, Ser: 1.13 mg/dL (ref 0.61–1.24)
GFR, Estimated: >60 mL/min (ref >60)
Glucose, Bld: 101 mg/dL — ABNORMAL HIGH (ref 70–99)
Potassium: 4.5 mmol/L (ref 3.5–5.1)
Sodium: 132 mmol/L — ABNORMAL LOW (ref 135–145)
Total Bilirubin: 0.6 mg/dL (ref 0.3–1.2)
Total Protein: 7.4 g/dL (ref 6.5–8.1)

## 2021-08-14 LAB — CBC WITH DIFFERENTIAL/PLATELET
Abs Immature Granulocytes: 0.04 10*3/uL (ref 0.00–0.07)
Basophils Absolute: 0.1 10*3/uL (ref 0.0–0.1)
Basophils Relative: 1 %
Eosinophils Absolute: 0.1 10*3/uL (ref 0.0–0.5)
Eosinophils Relative: 1 %
HCT: 44.6 % (ref 39.0–52.0)
Hemoglobin: 14.7 g/dL (ref 13.0–17.0)
Immature Granulocytes: 0 %
Lymphocytes Relative: 53 %
Lymphs Abs: 4.7 10*3/uL — ABNORMAL HIGH (ref 0.7–4.0)
MCH: 32 pg (ref 26.0–34.0)
MCHC: 33 g/dL (ref 30.0–36.0)
MCV: 97 fL (ref 80.0–100.0)
Monocytes Absolute: 0.7 10*3/uL (ref 0.1–1.0)
Monocytes Relative: 8 %
Neutro Abs: 3.3 10*3/uL (ref 1.7–7.7)
Neutrophils Relative %: 37 %
Platelets: 183 10*3/uL (ref 150–400)
RBC: 4.6 MIL/uL (ref 4.22–5.81)
RDW: 13.6 % (ref 11.5–15.5)
WBC: 8.9 10*3/uL (ref 4.0–10.5)
nRBC: 0 % (ref 0.0–0.2)

## 2021-08-14 LAB — LACTIC ACID, PLASMA: Lactic Acid, Venous: 0.9 mmol/L (ref 0.5–1.9)

## 2021-08-14 MED ORDER — SULFAMETHOXAZOLE-TRIMETHOPRIM 800-160 MG PO TABS: 1.0000 | ORAL_TABLET | Freq: Two times a day (BID) | ORAL | 0 refills | Status: AC

## 2021-08-14 MED ORDER — LIDOCAINE-EPINEPHRINE (PF) 2 %-1:200000 IJ SOLN
10.0000 mL | Freq: Once | INTRAMUSCULAR | Status: AC
  Administered 2021-08-14: 10 mL via INTRADERMAL

## 2021-08-14 NOTE — ED Notes (Addendum)
Discharge instructions reviewed with patient's wife. Wife verbalized understanding of instructions. Follow-up care and medications were reviewed. Patient ambulatory with steady gait. VSS upon discharge.

## 2021-08-14 NOTE — ED Provider Notes (Signed)
[facility] EMERGENCY DEPARTMENT Provider Note   CSN: [id number] Arrival date & time: 08/14/21  1039     History Chief Complaint  Patient presents with   Abscess    [name] is a 60 y.o. male with history of diabetes who presents with wound to the left lower leg which was sustained 9 days ago.  Patient also has history of seizure disorder on Depakote.  His wife believes that he had a seizure that led to the wound he sustained.  He states that he hit the left lower leg on the Leg of the bed 9 days ago.  Initially was quite bruised and began to swell.  He was seen by his PCP who is nonconcerning.  Eventually his home health nurse yesterday, 8 days after the initial incident, felt that the area looked like it may be getting infected.  Patient states that the area has opened up and noticed some blood but has never used any pus.  He denies any fevers or chills.  I personally reviewed the patient's medical records.  He has history of epilepsy on Depakote, COPD smoking 1.5 packs of cigarettes per day, hypothyroidism ,and type 2 diabetes.  HPI     Past Medical History:  Diagnosis Date   Anemia    Depression    Diabetes mellitus without complication (HCC)    Emphysema of lung (HCC)    GERD (gastroesophageal reflux disease)    Hiatal hernia    Hyperlipidemia    Hypertension    Memory loss    Seizures (HCC)    Thyroid disease     Patient Active Problem List   Diagnosis Date Noted   Toxic metabolic encephalopathy 01/24/2016   Pressure ulcer 01/23/2016   Non-insulin dependent type 2 diabetes mellitus (HCC)    Altered mental status 01/22/2016   COPD (chronic obstructive pulmonary disease) (HCC) 01/22/2016   Thrombocytopenia (HCC) 01/22/2016   Acute encephalopathy 11/01/2015   Chronic daily headache 03/08/2015   Localization-related symptomatic 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, intractable, without status epilepticus (HCC)  03/07/2015   Memory loss 03/07/2015   Seizure disorder (HCC) 03/05/2014   Other and unspecified hyperlipidemia 03/05/2014   Essential hypertension, benign 03/05/2014   Diabetes (HCC) 03/05/2014   Depression 03/05/2014   Generalized anxiety disorder 03/05/2014   Hypothyroidism 03/05/2014   Poor dentition 03/05/2014    Past Surgical History:  Procedure Laterality Date   APPENDECTOMY     DENTAL SURGERY     EYE SURGERY     TONSILLECTOMY AND ADENOIDECTOMY         Family History  Problem Relation Age of Onset   Stroke Mother    Heart disease Father    Thyroid disease Father    Thyroid disease Brother     Social History   Tobacco Use   Smoking status: Every Day    Years: 40.00    Types: E-cigarettes, Cigarettes   Smokeless tobacco: Never   Tobacco comments:    11/01/2015 "did smoke 3 ppd; he's been vaping for 4 years; no nicotine for the past year; smoked a cigarette 2 days ago"  Substance Use Topics   Alcohol use: No    Alcohol/week: 0.0 standard drinks   Drug use: No    Home Medications Prior to Admission medications   Medication Sig Start Date End Date Taking? Authorizing Provider  sulfamethoxazole-trimethoprim (BACTRIM DS) 800-160 MG tablet Take 1 tablet by mouth 2 (two) times daily for 7 days. 08/14/21 08/21/21  Yes Nick Armel R, PA-C  albuterol (PROVENTIL HFA;VENTOLIN HFA) 108 (90 BASE) MCG/ACT inhaler Inhale 2 puffs into the lungs every 4 (four) hours as needed for wheezing or shortness of breath.    [provider]  alprazolam Prudy Feeler) 2 MG tablet Take 0.5 tablets (1 mg total) by mouth 2 (two) times daily. 11/03/15   Kathlynn Grate, DO  atorvastatin (LIPITOR) 40 MG tablet Take 40 mg by mouth daily.    [provider]  citalopram (CELEXA) 20 MG tablet Take 20 mg by mouth daily.  02/25/15   [provider]  divalproex (DEPAKOTE) 250 MG DR tablet Take 500 mg by mouth 2 (two) times daily. 03/23/14   [provider]   ketoconazole (NIZORAL) 2 % cream Apply 1 application topically daily.    [provider]  levothyroxine (SYNTHROID, LEVOTHROID) 150 MCG tablet Take 150 mcg by mouth daily before breakfast.  12/30/14   [provider]  lisinopril (PRINIVIL,ZESTRIL) 2.5 MG tablet Take 2.5 mg by mouth daily. 05/09/15   [provider]  omeprazole (PRILOSEC) 20 MG capsule Take 20 mg by mouth daily.    [provider]  tiotropium (SPIRIVA) 18 MCG inhalation capsule Place 18 mcg into inhaler and inhale daily.    [provider]  traZODone (DESYREL) 50 MG tablet Take 1 tablet (50 mg total) by mouth at bedtime as needed for sleep. 01/24/16   Dhungel, Theda Belfast, MD    Allergies    Levetiracetam, Tegretol [carbamazepine], Penicillins, and Topiramate  Review of Systems   Review of Systems  Constitutional: Negative.   HENT: Negative.    Respiratory: Negative.    Cardiovascular: Negative.   Gastrointestinal: Negative.   Genitourinary: Negative.   Musculoskeletal: Negative.   Skin:  Positive for wound.  Neurological: Negative.    Physical Exam Updated Vital Signs BP (!) 159/102 (BP Location: Right Arm)   Pulse 67   Temp 98.6 F (37 C)   Resp 16   SpO2 97%   Physical Exam Vitals and nursing note reviewed.  Constitutional:      Appearance: He is not ill-appearing or toxic-appearing.  HENT:     Head: Normocephalic and atraumatic.     Nose: Nose normal. No congestion.     Mouth/Throat:     Mouth: Mucous membranes are moist.     Pharynx: No oropharyngeal exudate or posterior oropharyngeal erythema.  Eyes:     General:        Right eye: No discharge.        Left eye: No discharge.     Extraocular Movements: Extraocular movements intact.     Conjunctiva/sclera: Conjunctivae normal.     Pupils: Pupils are equal, round, and reactive to light.  Cardiovascular:     Rate and Rhythm: Normal rate and regular rhythm.     Pulses: Normal pulses.     Heart sounds: Normal  heart sounds. No murmur heard. Pulmonary:     Effort: Pulmonary effort is normal. No respiratory distress.     Breath sounds: Normal breath sounds. No wheezing or rales.  Abdominal:     General: Bowel sounds are normal. There is no distension.     Palpations: Abdomen is soft.     Tenderness: There is no abdominal tenderness.  Musculoskeletal:        General: No deformity.     Cervical back: Neck supple.     Right lower leg: No edema.     Left lower leg: No edema.  Legs:  Skin:    General: Skin is warm and dry.     Capillary Refill: Capillary refill takes less than 2 seconds.     Findings: Wound present.  Neurological:     General: No focal deficit present.     Mental Status: He is alert and oriented to person, place, and time. Mental status is at baseline.  Psychiatric:        Mood and Affect: Mood normal.    ED Results / Procedures / Treatments   Labs (all labs ordered are listed, but only abnormal results are displayed) Labs Reviewed  COMPREHENSIVE METABOLIC PANEL - Abnormal; Notable for the following components:      Result Value   Sodium 132 (*)    Chloride 90 (*)    CO2 35 (*)    Glucose, Bld 101 (*)    All other components within normal limits  CBC WITH DIFFERENTIAL/PLATELET - Abnormal; Notable for the following components:   Lymphs Abs 4.7 (*)    All other components within normal limits  LACTIC ACID, PLASMA  URINALYSIS, ROUTINE W REFLEX MICROSCOPIC    EKG None  Radiology DG Tibia/Fibula Left  Result Date: 08/14/2021 CLINICAL DATA:  Pain and swelling EXAM: LEFT TIBIA AND FIBULA - 2 VIEW COMPARISON:  None. FINDINGS: No fracture or dislocation is seen. There are no focal lytic lesions. There are no opaque foreign bodies. There is subcutaneous stranding, possibly edema. IMPRESSION: No radiographic abnormality is seen in the left tibia and fibula. Electronically Signed   By: Ernie Avena M.D.   On: 08/14/2021 11:35    Procedures .Marland KitchenIncision and  Drainage  Date/Time: 08/14/2021 4:12 PM Performed by: Paris Lore, PA-C Authorized by: Paris Lore, PA-C   Consent:    Consent obtained:  Verbal   Consent given by:  Patient   Risks discussed:  Bleeding, incomplete drainage, pain and damage to other organs   Alternatives discussed:  No treatment Universal protocol:    Procedure explained and questions answered to patient or proxy's satisfaction: yes     Relevant documents present and verified: yes     Test results available : yes     Imaging studies available: yes     Required blood products, implants, devices, and special equipment available: yes     Site/side marked: yes     Immediately prior to procedure, a time out was called: yes     Patient identity confirmed:  Verbally with patient Location:    Type:  Abscess   Size:  4 cm round   Location:  Lower extremity   Lower extremity location:  Leg   Leg location:  L lower leg Pre-procedure details:    Skin preparation:  Betadine Sedation:    Sedation type:  None Anesthesia:    Anesthesia method:  Local infiltration   Local anesthetic:  Lidocaine 2% WITH epi Procedure type:    Complexity:  Complex Procedure details:    Incision types:  Cruciate   Incision depth:  Subcutaneous   Wound management:  Probed and deloculated, irrigated with saline and extensive cleaning   Drainage:  Bloody   Drainage amount:  Moderate   Wound treatment:  Wound left open   Packing materials:  None Post-procedure details:    Procedure completion:  Tolerated well, no immediate complications Ultrasound ED Soft Tissue  Date/Time: 08/14/2021 4:13 PM Performed by: Paris Lore, PA-C Authorized by: Paris Lore, PA-C   Procedure details:    Indications: localization of  abscess and evaluate for cellulitis     Transverse view:  Visualized   Longitudinal view:  Visualized   Images: not archived   Location:    Location: lower extremity     Side:   Left Findings:     abscess present    cellulitis present Comments:     Abscess versus hematoma identified.  Given erythema and tenderness palpation we will proceed with I&D   Medications Ordered in ED Medications  lidocaine-EPINEPHrine (XYLOCAINE W/EPI) 2 %-1:200000 (PF) injection 10 mL (10 mLs Intradermal Given by Other 08/14/21 1513)    ED Course  I have reviewed the triage vital signs and the nursing notes.  Pertinent labs & imaging results that were available during my care of the patient were reviewed by me and considered in my medical decision making (see chart for details).    MDM Rules/Calculators/A&P                          60 year old male presents with concern for left lower extremity hematoma versus abscess sustained 9 days ago.  Differential diagnosis includes resolving acute abscess, cellulitis, hematoma, erysipelas, necrotizing soft tissue infection.  Hypertensive on intake vitals evaluation 1.  Cardiopulmonary exam is normal, abdominal exam is benign.  Left lower extremity exam revealed 4 cm hematoma versus abscess in the left lower leg with surrounding bruising, erythema, and tenderness to palpation.  Central area of the abscess/hematoma is open and oozing small amount of blood. Bedside ultrasound performed which revealed a collection of fluid though it was quite cloudy and concerning for possible coagulated blood.  Shared decision making with the patient and his wife regarding the role of incision and drainage in the setting and they expressed wish to proceed with I&D to evaluate for possible purulent drainage.  CBC unremarkable, CMP with mild hyponatremia 132.  Lactic acid is normal  Incision and drainage as above with moderate amount of bloody output, evidence of coagulated blood within, and no purulent discharge.  Favored diagnosis of hematoma secondary to injury, however given erythema of the skin and warmth to the touch we will proceed with antibiosis in the  outpatient setting as well as outpatient follow-up.  Patient with penicillin allergy, will discharge with Bactrim prescription.  No further Coborns in the ER at this time.  Casmir voiced understanding with medical evaluation and treatment plan.  He was questions answered to his expressed inspection.  Return precautions are given.  Patient is well-appearing, stable, and appropriate for discharge at this time.  This chart was dictated using voice recognition software, Dragon. Despite the best efforts of this provider to proofread and correct errors, errors may still occur which can change documentation meaning.   Final Clinical Impression(s) / ED Diagnoses Final diagnoses:  Hematoma    Rx / DC Orders ED Discharge Orders          Ordered    sulfamethoxazole-trimethoprim (BACTRIM DS) 800-160 MG tablet  2 times daily        08/14/21 1557             Percival Glasheen, Eugene Gavia, PA-C 08/14/21 1617    Terrilee Files, MD 08/15/21 1230

## 2021-08-14 NOTE — ED Notes (Signed)
Pt was found to be in lobby. EDT obtained vitals in the lobby. Discharge instructions gone over with pt's wife.

## 2021-08-14 NOTE — ED Notes (Signed)
Pt support person noted to be sitting on stretcher. Per pt's support person, pt went to the bathroom "over there" pointing towards Tra [address] and stated, "He is probably waiting for me in the waiting room, we are just waiting on discharge papers". EDT checking bathrooms to locate pt.

## 2021-08-14 NOTE — ED Triage Notes (Signed)
Patient with history of diabetes here for evaluation of abscess on left leg that appeared several days ago. Patient alert, oriented, and in no apparent distress at this time.

## 2021-08-14 NOTE — ED Provider Notes (Signed)
Emergency Medicine Provider Triage Evaluation Note  [name] , a 60 y.o. male  was evaluated in triage.  Pt complains of abscess to left lower leg x1 week.  Patient was evaluated by his primary care provider within the last week.  Patient was not prescribed antibiotics.  He has had a home health nurse to evaluate the area and was informed to return to the ED for further evaluation.  Patient has a past medical history of diabetes.  Patient denies leg pain, fever, chills.  Review of Systems  Positive: Abscess to LLE Negative: Fever  Physical Exam  BP (!) 154/70 (BP Location: Right Arm)   Pulse 76   Temp 97.7 F (36.5 C) (Oral)   Resp 14   SpO2 93%  Gen:   Awake, no distress   Resp:  Normal effort  MSK:   Moves extremities without difficulty  Other:  Area cellulitis noted to left shin.  No obvious fluctuance.  Medical Decision Making  Medically screening exam initiated at 11:06 AM.  Appropriate orders placed.  Auryn Paige was informed that the remainder of the evaluation will be completed by another provider, this initial triage assessment does not replace that evaluation, and the importance of remaining in the ED until their evaluation is complete.    Yerick Eggebrecht A, PA-C 08/14/21 1108    Mancel Bale, MD 08/14/21 1723

## 2021-08-14 NOTE — Discharge Instructions (Signed)
[name] was seen in the ER today for the swelling and skin changes to his lower leg.  This was drained with a small amount of blood removed but there was primarily clotted blood inside.  There was no pus drained which is reassuring.  Given the skin changes around the area do suspect there may be early skin infection therefore has been placed on antibiotics which she should take as prescribed for the entire course.  Please follow-up with your primary care doctor and return to the ER with any new severe symptoms.

## 2022-06-28 ENCOUNTER — Institutional Professional Consult (permissible substitution): Payer: Medicare (Managed Care) | Admitting: Pulmonary Disease

## 2022-07-10 ENCOUNTER — Telehealth: Payer: Self-pay

## 2022-07-11 ENCOUNTER — Ambulatory Visit (INDEPENDENT_AMBULATORY_CARE_PROVIDER_SITE_OTHER): Payer: Medicare (Managed Care) | Admitting: Pulmonary Disease

## 2022-07-11 ENCOUNTER — Encounter: Payer: Self-pay | Admitting: Pulmonary Disease

## 2022-07-11 VITALS — BP 118/60 | HR 69 | Ht 66.0 in | Wt 228.0 lb

## 2022-07-11 DIAGNOSIS — J439 Emphysema, unspecified: Secondary | ICD-10-CM | POA: Diagnosis not present

## 2022-07-11 DIAGNOSIS — J9611 Chronic respiratory failure with hypoxia: Secondary | ICD-10-CM

## 2022-07-11 DIAGNOSIS — D696 Thrombocytopenia, unspecified: Secondary | ICD-10-CM | POA: Diagnosis not present

## 2022-07-11 NOTE — Patient Instructions (Signed)
Advanced chronic obstructive pulmonary disease  Chronic respiratory failure  Needs oxygen supplementation  Oxygen supplementation at 3 L with activity  Follow-up in 4 weeks to see how he is doing  Schedule for pulmonary function test

## 2022-07-11 NOTE — Progress Notes (Signed)
[name]    [id number]    03/22/1961  Primary Care Physician:Briscoe, Jannifer Rodney, MD  Referring Physician: Katherina Mires, MD [address] [place],  [place] [address]  Chief complaint:   Patient being seen for shortness of breath  HPI:  He was told about low oxygen levels when he was been evaluated for cataract surgery  Patient with a history of obstructive lung disease  Active smoker, smoked about 3 packs a day, currently down to about a pack a day on walking on cutting down even further  Was previously on Spiriva, recently started on Trelegy and this seems to be working better for him  Exercise ability only limited to about 25 yards or less He is shaky on his feet  He does have cough and congestion, usually worse with smoking  Has a history of anxiety/depression/seizure disorder, diabetes, type 2 diabetes  His spouse was present with him today Outpatient Encounter Medications as of 07/11/2022  Medication Sig   albuterol (PROVENTIL HFA;VENTOLIN HFA) 108 (90 BASE) MCG/ACT inhaler Inhale 2 puffs into the lungs every 4 (four) hours as needed for wheezing or shortness of breath.   alprazolam (XANAX) 2 MG tablet Take 0.5 tablets (1 mg total) by mouth 2 (two) times daily.   atorvastatin (LIPITOR) 40 MG tablet Take 40 mg by mouth daily.   citalopram (CELEXA) 20 MG tablet Take 20 mg by mouth daily.    divalproex (DEPAKOTE) 250 MG DR tablet Take 500 mg by mouth 2 (two) times daily.   ketoconazole (NIZORAL) 2 % cream Apply 1 application topically daily.   levothyroxine (SYNTHROID, LEVOTHROID) 150 MCG tablet Take 150 mcg by mouth daily before breakfast.    lisinopril (PRINIVIL,ZESTRIL) 2.5 MG tablet Take 2.5 mg by mouth daily.   omeprazole (PRILOSEC) 20 MG capsule Take 20 mg by mouth daily.   tiotropium (SPIRIVA) 18 MCG inhalation capsule Place 18 mcg into inhaler and inhale daily.   traZODone (DESYREL) 50 MG tablet Take 1 tablet (50 mg total) by mouth at  bedtime as needed for sleep.   No facility-administered encounter medications on file as of 07/11/2022.    Allergies as of 07/11/2022 - Review Complete 07/11/2022  Allergen Reaction Noted   Levetiracetam Other (See Comments) 01/22/2016   Tegretol [carbamazepine] Anxiety and Other (See Comments) 03/07/2015   Penicillins Other (See Comments) 04/02/2012   Topiramate Other (See Comments) 03/07/2015    Past Medical History:  Diagnosis Date   Anemia    Depression    Diabetes mellitus without complication (HCC)    Emphysema of lung (HCC)    GERD (gastroesophageal reflux disease)    Hiatal hernia    Hyperlipidemia    Hypertension    Memory loss    Seizures ([place])    Thyroid disease     Past Surgical History:  Procedure Laterality Date   APPENDECTOMY     DENTAL SURGERY     EYE SURGERY     TONSILLECTOMY AND ADENOIDECTOMY      Family History  Problem Relation Age of Onset   Stroke Mother    Heart disease Father    Thyroid disease Father    Thyroid disease Brother     Social History   Socioeconomic History   Marital status: Married    Spouse name: [name]    Number of children: 2   Years of education: GED   Highest education level: Not on file  Occupational History  Comment: Disabled  Tobacco Use   Smoking status: Every Day    Years: 40.00    Types: E-cigarettes, Cigarettes   Smokeless tobacco: Never   Tobacco comments:    11/01/2015 "did smoke 3 ppd; he's been vaping for 4 years; no nicotine for the past year; smoked a cigarette 2 days ago"  Substance and Sexual Activity   Alcohol use: No    Alcohol/week: 0.0 standard drinks of alcohol   Drug use: No   Sexual activity: Not on file  Other Topics Concern   Not on file  Social History Narrative   Patient lives at home with his wife [name]).    Disabled.   Education GED.   Left handed.   Caffeine sweet tea daily and diet coke.   Social Determinants of Health   Financial Resource Strain: Not on file   Food Insecurity: Not on file  Transportation Needs: Not on file  Physical Activity: Not on file  Stress: Not on file  Social Connections: Not on file  Intimate Partner Violence: Not on file    Review of Systems  Constitutional:  Positive for fatigue.  Respiratory:  Positive for cough and shortness of breath.     Vitals:   07/11/22 1615  BP: 118/60  Pulse: 69  SpO2: (!) 88%     Physical Exam Constitutional:      Appearance: He is obese.  HENT:     Head: Normocephalic.     Mouth/Throat:     Mouth: Mucous membranes are moist.  Cardiovascular:     Rate and Rhythm: Normal rate and regular rhythm.     Heart sounds: No murmur heard.    No friction rub.  Pulmonary:     Effort: No respiratory distress.     Breath sounds: No stridor. Rhonchi present. No wheezing.  Musculoskeletal:     Cervical back: No rigidity.  Neurological:     Mental Status: He is alert.  Psychiatric:        Mood and Affect: Mood normal.    Data Reviewed: Low-dose CT performed recently 06/06/2022 shows some mucous plugging emphysema Patient remembers coughing and feeling congested all the time CT was done  Assessment:  Advanced chronic obstructive pulmonary disease  Active smoker  Recent history of significant hypoxemia  Class II obesity  Deconditioning  Plan/Recommendations: Smoking cessation counseling  Ambulatory oximetry testing  Graded exercise as tolerated  Repeat CT scan to be considered in about 3 months  Continue Trelegy  Albuterol use as needed  The importance of quitting and risks of continuing to smoke was discussed with the patient and his spouse  Follow-up in about 2 to 3 months  Will schedule for a pulmonary function test  Patient requires 3 L oxygen with activity   Virl Diamond MD [facility] 07/11/2022, 4:46 PM  CC: Danny Mis, MD

## 2022-07-12 NOTE — Addendum Note (Signed)
Addended by: Loma Sousa on: 07/12/2022 11:29 AM   Modules accepted: Orders

## 2022-08-14 NOTE — Telephone Encounter (Signed)
Closing encounter

## 2023-02-16 IMAGING — CR DG TIBIA/FIBULA 2V*L*
4 series · 4 of 4 positions shown · non-contrast
Comparison: None.

CLINICAL DATA: Pain and swelling

EXAM:
LEFT TIBIA AND FIBULA - 2 VIEW

[tibia ap (1 of 2)]
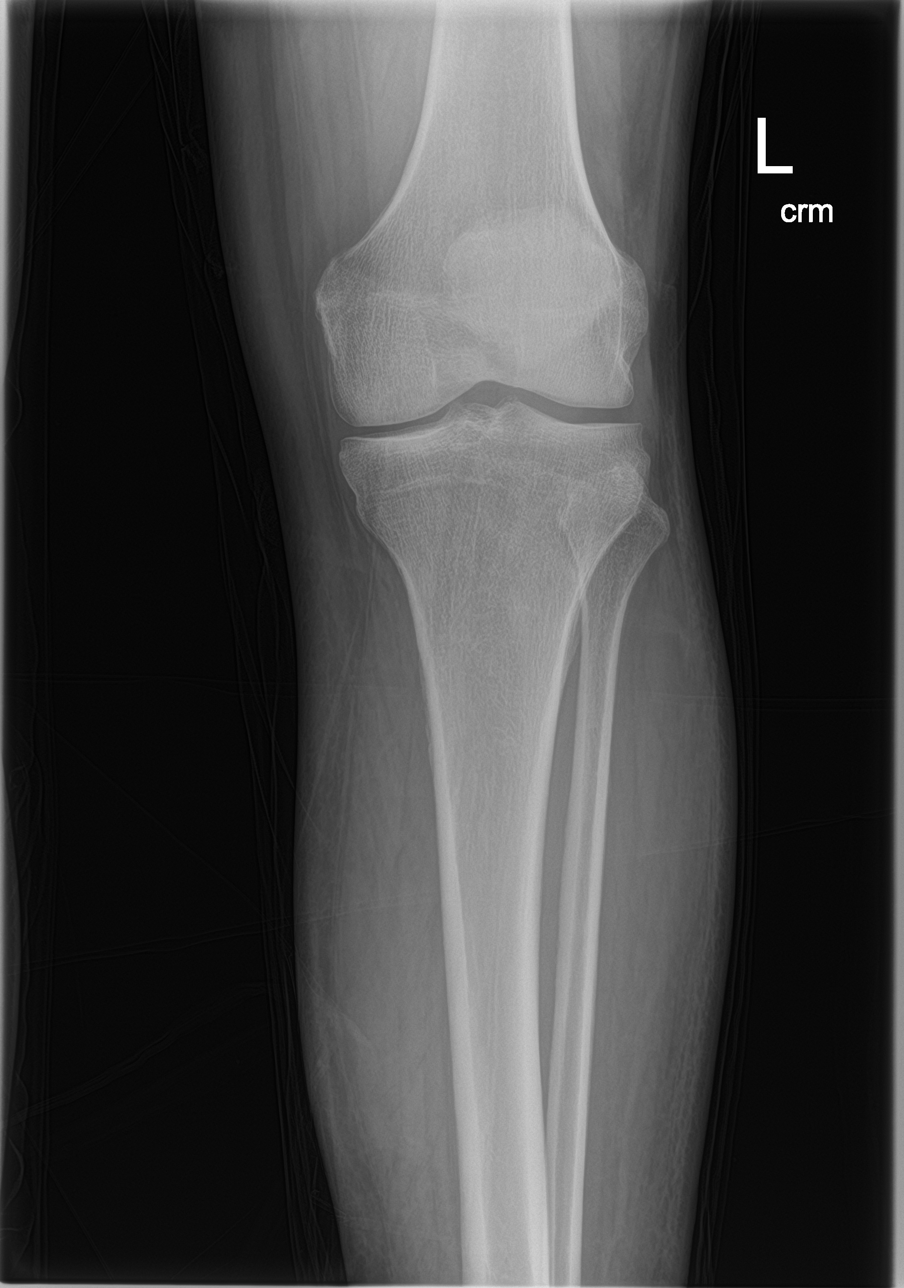

[tibia ap (2 of 2)]
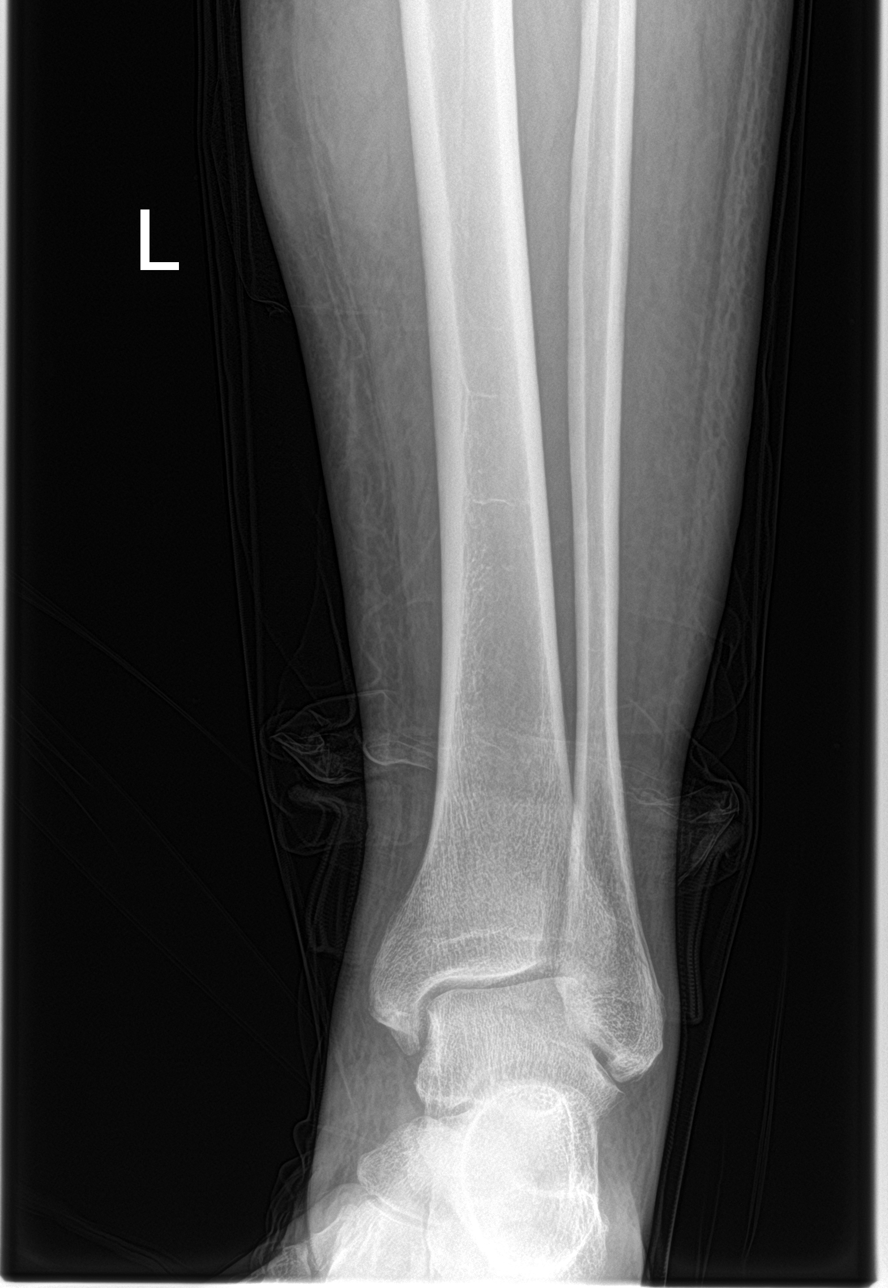

[tibia lat (1 of 2)]
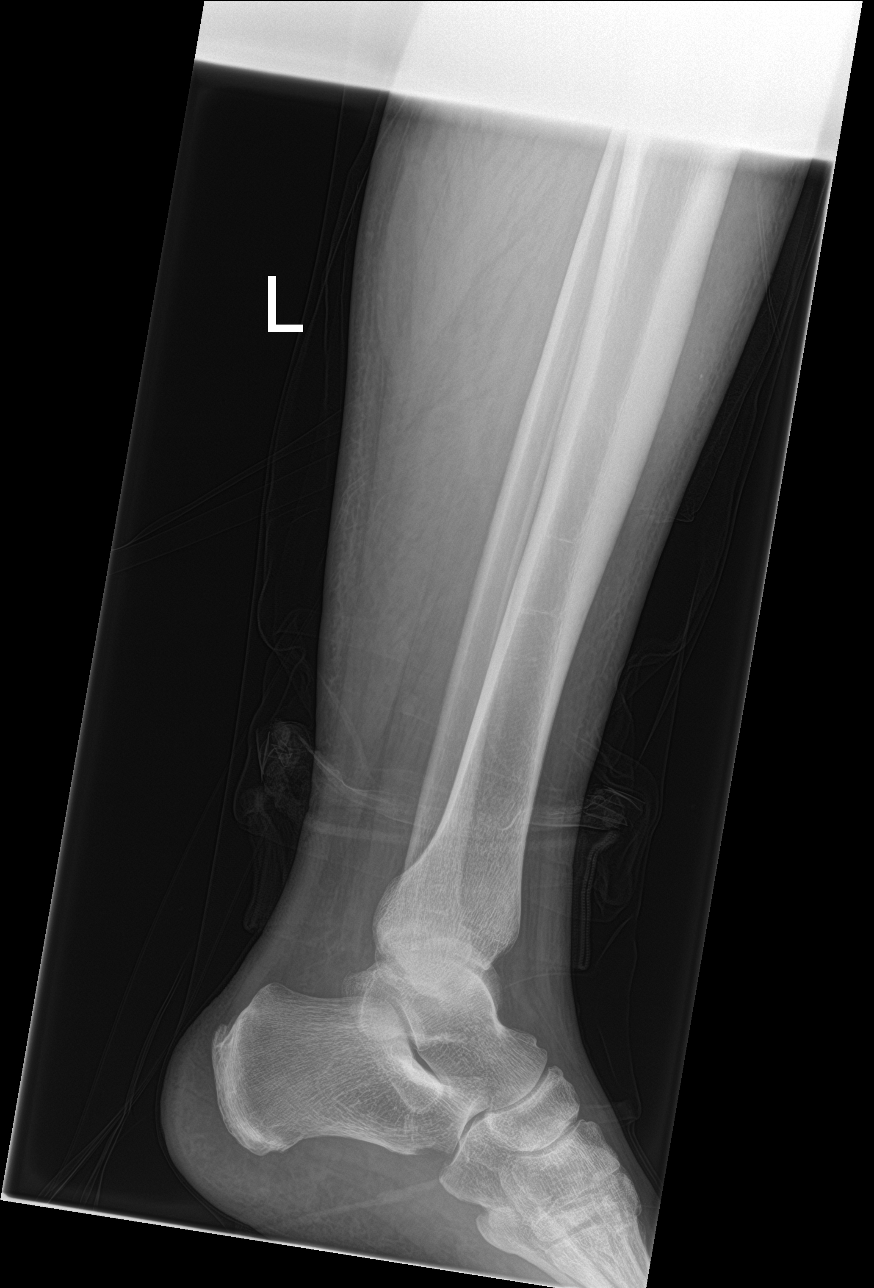

[tibia lat (2 of 2)]
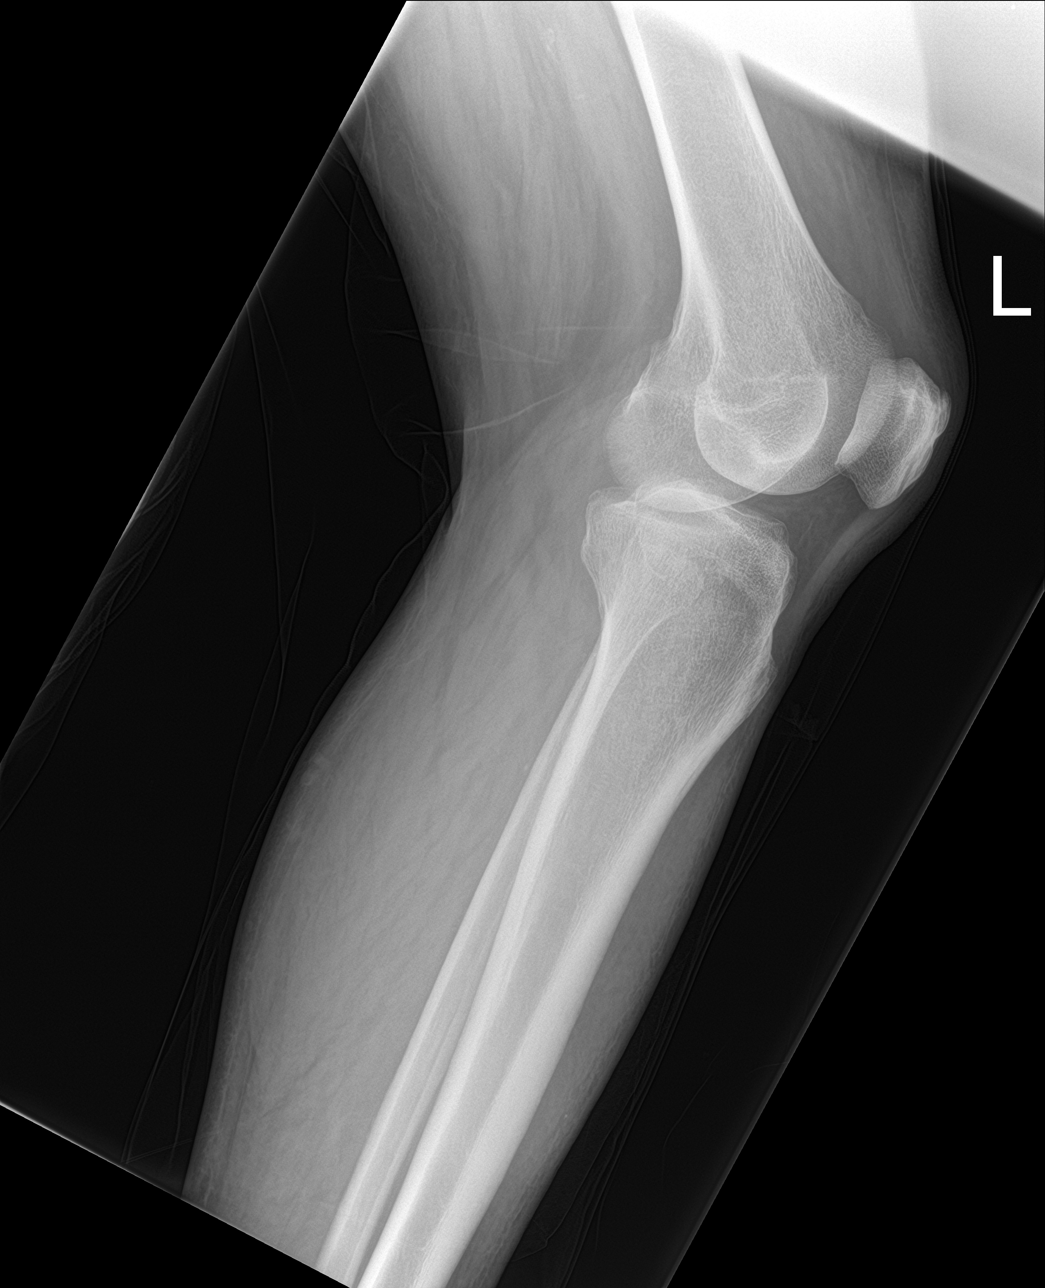

[4 of 4 positions shown; findings below may reference images not displayed]

FINDINGS: No fracture or dislocation is seen. There are no focal lytic
lesions. There are no opaque foreign bodies. There is subcutaneous
stranding, possibly edema.
IMPRESSION: No radiographic abnormality is seen in the left tibia and fibula.

## 2023-09-06 ENCOUNTER — Emergency Department (HOSPITAL_COMMUNITY): Payer: Medicare (Managed Care)

## 2023-09-06 ENCOUNTER — Encounter (HOSPITAL_COMMUNITY): Payer: Self-pay

## 2023-09-06 ENCOUNTER — Emergency Department (HOSPITAL_COMMUNITY)
Admission: EM | Admit: 2023-09-06 | Discharge: 2023-09-06 | Disposition: A | Discharge status: Home / Self Care | Payer: Medicare (Managed Care) | Attending: Emergency Medicine | Admitting: Emergency Medicine

## 2023-09-06 ENCOUNTER — Other Ambulatory Visit: Payer: Self-pay

## 2023-09-06 DIAGNOSIS — S32019A Unspecified fracture of first lumbar vertebra, initial encounter for closed fracture: Secondary | ICD-10-CM | POA: Diagnosis not present

## 2023-09-06 DIAGNOSIS — S8002XA Contusion of left knee, initial encounter: Secondary | ICD-10-CM | POA: Diagnosis not present

## 2023-09-06 DIAGNOSIS — S3992XA Unspecified injury of lower back, initial encounter: Secondary | ICD-10-CM | POA: Diagnosis present

## 2023-09-06 DIAGNOSIS — S8001XA Contusion of right knee, initial encounter: Secondary | ICD-10-CM | POA: Diagnosis not present

## 2023-09-06 DIAGNOSIS — R0781 Pleurodynia: Secondary | ICD-10-CM | POA: Insufficient documentation

## 2023-09-06 DIAGNOSIS — R1012 Left upper quadrant pain: Secondary | ICD-10-CM | POA: Diagnosis not present

## 2023-09-06 DIAGNOSIS — S0990XA Unspecified injury of head, initial encounter: Secondary | ICD-10-CM | POA: Diagnosis not present

## 2023-09-06 DIAGNOSIS — R531 Weakness: Secondary | ICD-10-CM | POA: Insufficient documentation

## 2023-09-06 DIAGNOSIS — W19XXXA Unspecified fall, initial encounter: Secondary | ICD-10-CM

## 2023-09-06 DIAGNOSIS — W01198A Fall on same level from slipping, tripping and stumbling with subsequent striking against other object, initial encounter: Secondary | ICD-10-CM | POA: Insufficient documentation

## 2023-09-06 DIAGNOSIS — S32029A Unspecified fracture of second lumbar vertebra, initial encounter for closed fracture: Secondary | ICD-10-CM | POA: Diagnosis not present

## 2023-09-06 DIAGNOSIS — S32009A Unspecified fracture of unspecified lumbar vertebra, initial encounter for closed fracture: Secondary | ICD-10-CM

## 2023-09-06 DIAGNOSIS — R079 Chest pain, unspecified: Secondary | ICD-10-CM | POA: Insufficient documentation

## 2023-09-06 DIAGNOSIS — M545 Low back pain, unspecified: Secondary | ICD-10-CM

## 2023-09-06 LAB — CBC
HCT: 44.1 % (ref 39.0–52.0)
Hemoglobin: 14.5 g/dL (ref 13.0–17.0)
MCH: 30.5 pg (ref 26.0–34.0)
MCHC: 32.9 g/dL (ref 30.0–36.0)
MCV: 92.6 fL (ref 80.0–100.0)
Platelets: 152 10*3/uL (ref 150–400)
RBC: 4.76 MIL/uL (ref 4.22–5.81)
RDW: 14.3 % (ref 11.5–15.5)
WBC: 7.4 10*3/uL (ref 4.0–10.5)
nRBC: 0 % (ref 0.0–0.2)

## 2023-09-06 LAB — URINALYSIS, ROUTINE W REFLEX MICROSCOPIC
Bacteria, UA: NONE SEEN
Bilirubin Urine: NEGATIVE
Glucose, UA: NEGATIVE mg/dL
Hgb urine dipstick: NEGATIVE
Ketones, ur: NEGATIVE mg/dL
Leukocytes,Ua: NEGATIVE
Nitrite: NEGATIVE
Protein, ur: 30 mg/dL — AB
Specific Gravity, Urine: 1.025 (ref 1.005–1.030)
pH: 6 (ref 5.0–8.0)

## 2023-09-06 LAB — COMPREHENSIVE METABOLIC PANEL
ALT: 11 U/L (ref 0–44)
AST: 15 U/L (ref 15–41)
Albumin: 3.8 g/dL (ref 3.5–5.0)
Alkaline Phosphatase: 42 U/L (ref 38–126)
Anion gap: 9 (ref 5–15)
BUN: 16 mg/dL (ref 8–23)
CO2: 28 mmol/L (ref 22–32)
Calcium: 9.2 mg/dL (ref 8.9–10.3)
Chloride: 99 mmol/L (ref 98–111)
Creatinine, Ser: 0.72 mg/dL (ref 0.61–1.24)
GFR, Estimated: >60 mL/min (ref >60)
Glucose, Bld: 100 mg/dL — ABNORMAL HIGH (ref 70–99)
Potassium: 4.5 mmol/L (ref 3.5–5.1)
Sodium: 136 mmol/L (ref 135–145)
Total Bilirubin: 0.7 mg/dL (ref <1.2)
Total Protein: 7.5 g/dL (ref 6.5–8.1)

## 2023-09-06 LAB — LIPASE, BLOOD: Lipase: 21 U/L (ref 11–51)

## 2023-09-06 MED ORDER — IOHEXOL 350 MG/ML SOLN
75.0000 mL | Freq: Once | INTRAVENOUS | Status: AC | PRN
  Administered 2023-09-06: 75 mL via INTRAVENOUS

## 2023-09-06 MED ORDER — LIDOCAINE 5 % EX PTCH
1.0000 | MEDICATED_PATCH | Freq: Once | CUTANEOUS | Status: DC
  Administered 2023-09-06: 1 via TRANSDERMAL
  Filled 2023-09-06: qty 1

## 2023-09-06 MED ORDER — LIDOCAINE 5 % EX PTCH
1.0000 | MEDICATED_PATCH | CUTANEOUS | Status: DC
  Administered 2023-09-06: 1 via TRANSDERMAL
  Filled 2023-09-06: qty 1

## 2023-09-06 NOTE — ED Provider Notes (Signed)
[facility] [facility] Provider Note   CSN: [id number] Arrival date & time: 09/06/23  1324     History {Add pertinent medical, surgical, social history, OB history to HPI:1} Chief Complaint  Patient presents with   Fall   Abdominal Pain   Back Pain    [name] is a 62 y.o. male.  62 year old male with prior medical history as detailed below presents for evaluation.  Patient complains of multiple falls over the last week.  He reports 3 days of falling with his last vomiting this morning.  He complains of generalized weakness that has progressed over the last several weeks.  He has had falls where he struck his head.  His last head injury was approximately 2 weeks ago.  He complains of pain primarily to the left flank overlying the left lateral ribs and left upper abdomen.  Patient is accompanied by his wife who is at bedside.  The history is provided by the patient and medical records.       Home Medications Prior to Admission medications   Medication Sig Start Date End Date Taking? Authorizing Provider  albuterol (PROVENTIL HFA;VENTOLIN HFA) 108 (90 BASE) MCG/ACT inhaler Inhale 2 puffs into the lungs every 4 (four) hours as needed for wheezing or shortness of breath.    [provider]  alprazolam Prudy Feeler) 2 MG tablet Take 0.5 tablets (1 mg total) by mouth 2 (two) times daily. 11/03/15   Kathlynn Grate, DO  atorvastatin (LIPITOR) 40 MG tablet Take 40 mg by mouth daily.    [provider]  citalopram (CELEXA) 20 MG tablet Take 20 mg by mouth daily.  02/25/15   [provider]  divalproex (DEPAKOTE) 250 MG DR tablet Take 500 mg by mouth 2 (two) times daily. 03/23/14   [provider]  ketoconazole (NIZORAL) 2 % cream Apply 1 application topically daily.    [provider]  levothyroxine (SYNTHROID, LEVOTHROID) 150 MCG tablet Take 150 mcg by mouth daily before breakfast.  12/30/14   [provider]  lisinopril (PRINIVIL,ZESTRIL) 2.5 MG tablet Take 2.5 mg by mouth daily. 05/09/15   [provider]  omeprazole (PRILOSEC) 20 MG capsule Take 20 mg by mouth daily.    [provider]  tiotropium (SPIRIVA) 18 MCG inhalation capsule Place 18 mcg into inhaler and inhale daily.    [provider]  traZODone (DESYREL) 50 MG tablet Take 1 tablet (50 mg total) by mouth at bedtime as needed for sleep. 01/24/16   Dhungel, Theda Belfast, MD      Allergies    Levetiracetam, Tegretol [carbamazepine], Penicillins, and Topiramate    Review of Systems   Review of Systems  All other systems reviewed and are negative.   Physical Exam Updated Vital Signs BP (!) 181/79   Pulse 73   Temp 98.2 F (36.8 C)   Resp 14   Ht 5\' 6"  (1.676 m)   Wt 94.8 kg   SpO2 92%   BMI 33.73 kg/m  Physical Exam Vitals and nursing note reviewed.  Constitutional:      General: He is not in acute distress.    Appearance: Normal appearance. He is well-developed.  HENT:     Head: Normocephalic and atraumatic.  Eyes:     Conjunctiva/sclera: Conjunctivae normal.     Pupils: Pupils are equal, round, and reactive to light.  Cardiovascular:     Rate and Rhythm: Normal rate and regular rhythm.     Heart  sounds: Normal heart sounds.  Pulmonary:     Effort: Pulmonary effort is normal. No respiratory distress.     Breath sounds: Normal breath sounds.     Comments: Mild tenderness with palpation overlying the left lateral inferior ribs and left lateral upper quadrant of the abdomen. Abdominal:     General: There is no distension.     Palpations: Abdomen is soft.     Tenderness: There is no abdominal tenderness.  Musculoskeletal:        General: No deformity. Normal range of motion.     Cervical back: Normal range of motion and neck supple.  Skin:    General: Skin is warm and dry.     Comments: Superficial abrasions to bilateral anterior knees.  Neurological:     General: No focal deficit present.      Mental Status: He is alert and oriented to person, place, and time.     ED Results / Procedures / Treatments   Labs (all labs ordered are listed, but only abnormal results are displayed) Labs Reviewed  COMPREHENSIVE METABOLIC PANEL - Abnormal; Notable for the following components:      Result Value   Glucose, Bld 100 (*)    All other components within normal limits  URINALYSIS, ROUTINE W REFLEX MICROSCOPIC - Abnormal; Notable for the following components:   Color, Urine AMBER (*)    Protein, ur 30 (*)    All other components within normal limits  LIPASE, BLOOD  CBC    EKG None  Radiology No results found.  Procedures Procedures  {Document cardiac monitor, telemetry assessment procedure when appropriate:1}  Medications Ordered in ED Medications - No data to display  ED Course/ Medical Decision Making/ A&P   {   Click here for ABCD2, HEART and other calculatorsREFRESH Note before signing :1}                              Medical Decision Making Amount and/or Complexity of Data Reviewed Radiology: ordered.    Medical Screen Complete  This patient presented to the ED with complaint of ***.  This complaint involves an extensive number of treatment options. The initial differential diagnosis includes, but is not limited to, ***  This presentation is: {IllnessRisk:19196::"***","Acute","Chronic","Self-Limited","Previously Undiagnosed","Uncertain Prognosis","Complicated","Systemic Symptoms","Threat to Life/Bodily Function"}    Co morbidities that complicated the patient's evaluation  ***   Additional history obtained:  Additional history obtained from {History source:19196::"EMS","Spouse","Family","Friend","Caregiver"} External records from outside sources obtained and reviewed including prior ED visits and prior Inpatient records.    Lab Tests:  I ordered and personally interpreted labs.  The pertinent results include:  ***   Imaging Studies  ordered:  I ordered imaging studies including ***  I independently visualized and interpreted obtained imaging which showed *** I agree with the radiologist interpretation.   Cardiac Monitoring:  The patient was maintained on a cardiac monitor.  I personally viewed and interpreted the cardiac monitor which showed an underlying rhythm of: ***   Medicines ordered:  I ordered medication including ***  for ***  Reevaluation of the patient after these medicines showed that the patient: {resolved/improved/worsened:23923::"improved"}    Test Considered:  ***   Critical Interventions:  ***   Consultations Obtained:  I consulted ***,  and discussed lab and imaging findings as well as pertinent plan of care.    Problem List / ED Course:  ***   Reevaluation:  After the interventions noted above,  I reevaluated the patient and found that they have: {resolved/improved/worsened:23923::"improved"}   Social Determinants of Health:  ***   Disposition:  After consideration of the diagnostic results and the patients response to treatment, I feel that the patent would benefit from ***.    {Document critical care time when appropriate:1} {Document review of labs and clinical decision tools ie heart score, Chads2Vasc2 etc:1}  {Document your independent review of radiology images, and any outside records:1} {Document your discussion with family members, caretakers, and with consultants:1} {Document social determinants of health affecting pt's care:1} {Document your decision making why or why not admission, treatments were needed:1} Final Clinical Impression(s) / ED Diagnoses Final diagnoses:  None    Rx / DC Orders ED Discharge Orders     None

## 2023-09-06 NOTE — Discharge Instructions (Signed)
Return for any problem.  ?

## 2023-09-06 NOTE — ED Notes (Signed)
Pt's spO2 89% in triage & does NOT wear home O2, he would not leave on the n/c for O2 in triage room bc it was annoying him & causing him to feel anxious. PA in the room now for MSE.

## 2023-09-06 NOTE — ED Notes (Signed)
Pt placed on 2L of o2 nasal canula due to SpO2 being 90%

## 2023-09-06 NOTE — ED Provider Triage Note (Signed)
Emergency Medicine Provider Triage Evaluation Note  [name] , a 62 y.o. male  was evaluated in triage.  Pt complains of numerous complaints today, though endorses left lower abdominal pain with pain in the low back as well.  Wife reports that he is fallen 3 days in a row, last fall was this morning.  Has been generally weak for the last several weeks.  Wife reports that he has been having difficulty sensing whether or not he needs to pee for at least 6 months.  Patient rates pain 10/10, he is alert and oriented to self, place but confused about time.  Denies any nausea, vomiting, diarrhea.  Smokes at least 20 cigarettes/day, history of COPD, not on home oxygen..  Review of Systems  Positive: Shob, abd pain, back pain Negative:   Physical Exam  BP (!) 158/81   Pulse 82   Temp 98.2 F (36.8 C)   Resp 18   Ht 5\' 6"  (1.676 m)   Wt 94.8 kg   SpO2 (!) 89%   BMI 33.73 kg/m  Gen:   Awake, no distress   Resp:  Normal effort  MSK:   Moves extremities without difficulty  Other:  Coarse breath sounds bilateral lung fields with no respiratory distress, but decreased oxygen saturation on room air.  Medical Decision Making  Medically screening exam initiated at 2:15 PM.  Appropriate orders placed.  [name] was informed that the remainder of the evaluation will be completed by another provider, this initial triage assessment does not replace that evaluation, and the importance of remaining in the ED until their evaluation is complete.  Workup initiated in triage    Olene Floss, [place] 09/06/23 1424

## 2023-09-06 NOTE — ED Triage Notes (Signed)
Pt came in via POV d/t lower abd pain & lower back pain. Family at bedside reports he has fallen 3 days in a row (last fall this AM) & has been very weak over the past couple weeks. Is not on thinners. A/Ox3-confused on time, pt rates pain 10/10 while in triage.

## 2024-01-30 ENCOUNTER — Encounter (HOSPITAL_COMMUNITY): Payer: Self-pay

## 2024-01-30 ENCOUNTER — Emergency Department (HOSPITAL_COMMUNITY): Payer: Medicare (Managed Care)

## 2024-01-30 ENCOUNTER — Other Ambulatory Visit: Payer: Self-pay

## 2024-01-30 ENCOUNTER — Inpatient Hospital Stay (HOSPITAL_COMMUNITY)
Admission: EM | Admit: 2024-01-30 | Discharge: 2024-02-24 | DRG: 025 | Disposition: A | Discharge status: Skilled Nursing Facility | Payer: Medicare (Managed Care) | Attending: Internal Medicine | Admitting: Internal Medicine

## 2024-01-30 DIAGNOSIS — Z720 Tobacco use: Secondary | ICD-10-CM | POA: Diagnosis not present

## 2024-01-30 DIAGNOSIS — I82890 Acute embolism and thrombosis of other specified veins: Secondary | ICD-10-CM | POA: Diagnosis not present

## 2024-01-30 DIAGNOSIS — E039 Hypothyroidism, unspecified: Secondary | ICD-10-CM

## 2024-01-30 DIAGNOSIS — R4182 Altered mental status, unspecified: Secondary | ICD-10-CM | POA: Diagnosis not present

## 2024-01-30 DIAGNOSIS — D649 Anemia, unspecified: Secondary | ICD-10-CM | POA: Diagnosis present

## 2024-01-30 DIAGNOSIS — S065XAA Traumatic subdural hemorrhage with loss of consciousness status unknown, initial encounter: Secondary | ICD-10-CM | POA: Diagnosis present

## 2024-01-30 DIAGNOSIS — F419 Anxiety disorder, unspecified: Secondary | ICD-10-CM | POA: Diagnosis not present

## 2024-01-30 DIAGNOSIS — G928 Other toxic encephalopathy: Secondary | ICD-10-CM | POA: Diagnosis not present

## 2024-01-30 DIAGNOSIS — Z79899 Other long term (current) drug therapy: Secondary | ICD-10-CM | POA: Diagnosis not present

## 2024-01-30 DIAGNOSIS — I959 Hypotension, unspecified: Secondary | ICD-10-CM | POA: Diagnosis present

## 2024-01-30 DIAGNOSIS — L89156 Pressure-induced deep tissue damage of sacral region: Secondary | ICD-10-CM | POA: Diagnosis present

## 2024-01-30 DIAGNOSIS — E035 Myxedema coma: Secondary | ICD-10-CM

## 2024-01-30 DIAGNOSIS — S066X0A Traumatic subarachnoid hemorrhage without loss of consciousness, initial encounter: Secondary | ICD-10-CM | POA: Diagnosis not present

## 2024-01-30 DIAGNOSIS — Z880 Allergy status to penicillin: Secondary | ICD-10-CM

## 2024-01-30 DIAGNOSIS — G935 Compression of brain: Secondary | ICD-10-CM | POA: Diagnosis present

## 2024-01-30 DIAGNOSIS — J449 Chronic obstructive pulmonary disease, unspecified: Secondary | ICD-10-CM

## 2024-01-30 DIAGNOSIS — Z7951 Long term (current) use of inhaled steroids: Secondary | ICD-10-CM

## 2024-01-30 DIAGNOSIS — I6200 Nontraumatic subdural hemorrhage, unspecified: Secondary | ICD-10-CM | POA: Diagnosis not present

## 2024-01-30 DIAGNOSIS — E785 Hyperlipidemia, unspecified: Secondary | ICD-10-CM | POA: Diagnosis present

## 2024-01-30 DIAGNOSIS — D696 Thrombocytopenia, unspecified: Secondary | ICD-10-CM | POA: Diagnosis present

## 2024-01-30 DIAGNOSIS — M7989 Other specified soft tissue disorders: Secondary | ICD-10-CM | POA: Diagnosis not present

## 2024-01-30 DIAGNOSIS — G9341 Metabolic encephalopathy: Secondary | ICD-10-CM | POA: Diagnosis present

## 2024-01-30 DIAGNOSIS — Z716 Tobacco abuse counseling: Secondary | ICD-10-CM

## 2024-01-30 DIAGNOSIS — F17210 Nicotine dependence, cigarettes, uncomplicated: Secondary | ICD-10-CM | POA: Diagnosis not present

## 2024-01-30 DIAGNOSIS — T68XXXA Hypothermia, initial encounter: Secondary | ICD-10-CM

## 2024-01-30 DIAGNOSIS — R680 Hypothermia, not associated with low environmental temperature: Secondary | ICD-10-CM | POA: Diagnosis present

## 2024-01-30 DIAGNOSIS — E119 Type 2 diabetes mellitus without complications: Secondary | ICD-10-CM | POA: Diagnosis present

## 2024-01-30 DIAGNOSIS — I82621 Acute embolism and thrombosis of deep veins of right upper extremity: Secondary | ICD-10-CM | POA: Diagnosis not present

## 2024-01-30 DIAGNOSIS — I10 Essential (primary) hypertension: Secondary | ICD-10-CM | POA: Diagnosis present

## 2024-01-30 DIAGNOSIS — S066XAA Traumatic subarachnoid hemorrhage with loss of consciousness status unknown, initial encounter: Secondary | ICD-10-CM | POA: Diagnosis present

## 2024-01-30 DIAGNOSIS — R569 Unspecified convulsions: Secondary | ICD-10-CM | POA: Diagnosis not present

## 2024-01-30 DIAGNOSIS — N390 Urinary tract infection, site not specified: Secondary | ICD-10-CM | POA: Diagnosis not present

## 2024-01-30 DIAGNOSIS — Z79890 Hormone replacement therapy (postmenopausal): Secondary | ICD-10-CM

## 2024-01-30 DIAGNOSIS — R1312 Dysphagia, oropharyngeal phase: Secondary | ICD-10-CM | POA: Diagnosis not present

## 2024-01-30 DIAGNOSIS — F411 Generalized anxiety disorder: Secondary | ICD-10-CM | POA: Diagnosis present

## 2024-01-30 DIAGNOSIS — W1830XA Fall on same level, unspecified, initial encounter: Secondary | ICD-10-CM | POA: Diagnosis present

## 2024-01-30 DIAGNOSIS — Z7984 Long term (current) use of oral hypoglycemic drugs: Secondary | ICD-10-CM

## 2024-01-30 DIAGNOSIS — E871 Hypo-osmolality and hyponatremia: Secondary | ICD-10-CM | POA: Diagnosis present

## 2024-01-30 DIAGNOSIS — T80219A Unspecified infection due to central venous catheter, initial encounter: Secondary | ICD-10-CM | POA: Diagnosis not present

## 2024-01-30 DIAGNOSIS — F32A Depression, unspecified: Secondary | ICD-10-CM | POA: Diagnosis present

## 2024-01-30 DIAGNOSIS — Z6832 Body mass index (BMI) 32.0-32.9, adult: Secondary | ICD-10-CM

## 2024-01-30 DIAGNOSIS — R296 Repeated falls: Secondary | ICD-10-CM | POA: Diagnosis not present

## 2024-01-30 DIAGNOSIS — M109 Gout, unspecified: Secondary | ICD-10-CM | POA: Diagnosis present

## 2024-01-30 DIAGNOSIS — E66811 Obesity, class 1: Secondary | ICD-10-CM | POA: Diagnosis present

## 2024-01-30 DIAGNOSIS — Z8349 Family history of other endocrine, nutritional and metabolic diseases: Secondary | ICD-10-CM

## 2024-01-30 DIAGNOSIS — R531 Weakness: Principal | ICD-10-CM

## 2024-01-30 DIAGNOSIS — H547 Unspecified visual loss: Secondary | ICD-10-CM | POA: Diagnosis present

## 2024-01-30 DIAGNOSIS — Y848 Other medical procedures as the cause of abnormal reaction of the patient, or of later complication, without mention of misadventure at the time of the procedure: Secondary | ICD-10-CM | POA: Diagnosis not present

## 2024-01-30 DIAGNOSIS — R502 Drug induced fever: Secondary | ICD-10-CM | POA: Diagnosis not present

## 2024-01-30 DIAGNOSIS — J439 Emphysema, unspecified: Secondary | ICD-10-CM | POA: Diagnosis present

## 2024-01-30 DIAGNOSIS — Z888 Allergy status to other drugs, medicaments and biological substances status: Secondary | ICD-10-CM

## 2024-01-30 DIAGNOSIS — Z1152 Encounter for screening for COVID-19: Secondary | ICD-10-CM | POA: Diagnosis not present

## 2024-01-30 DIAGNOSIS — M79602 Pain in left arm: Secondary | ICD-10-CM | POA: Diagnosis present

## 2024-01-30 DIAGNOSIS — Z881 Allergy status to other antibiotic agents status: Secondary | ICD-10-CM

## 2024-01-30 DIAGNOSIS — Y92009 Unspecified place in unspecified non-institutional (private) residence as the place of occurrence of the external cause: Secondary | ICD-10-CM | POA: Diagnosis not present

## 2024-01-30 DIAGNOSIS — Z8249 Family history of ischemic heart disease and other diseases of the circulatory system: Secondary | ICD-10-CM

## 2024-01-30 DIAGNOSIS — G9340 Encephalopathy, unspecified: Secondary | ICD-10-CM | POA: Diagnosis not present

## 2024-01-30 DIAGNOSIS — G40909 Epilepsy, unspecified, not intractable, without status epilepticus: Secondary | ICD-10-CM | POA: Diagnosis present

## 2024-01-30 DIAGNOSIS — T82868A Thrombosis of vascular prosthetic devices, implants and grafts, initial encounter: Secondary | ICD-10-CM | POA: Diagnosis not present

## 2024-01-30 DIAGNOSIS — E876 Hypokalemia: Secondary | ICD-10-CM | POA: Diagnosis present

## 2024-01-30 DIAGNOSIS — R7303 Prediabetes: Secondary | ICD-10-CM | POA: Diagnosis not present

## 2024-01-30 DIAGNOSIS — Z91148 Patient's other noncompliance with medication regimen for other reason: Secondary | ICD-10-CM

## 2024-01-30 DIAGNOSIS — F17213 Nicotine dependence, cigarettes, with withdrawal: Secondary | ICD-10-CM | POA: Diagnosis not present

## 2024-01-30 DIAGNOSIS — B9620 Unspecified Escherichia coli [E. coli] as the cause of diseases classified elsewhere: Secondary | ICD-10-CM | POA: Diagnosis not present

## 2024-01-30 DIAGNOSIS — R509 Fever, unspecified: Secondary | ICD-10-CM | POA: Diagnosis not present

## 2024-01-30 DIAGNOSIS — R7989 Other specified abnormal findings of blood chemistry: Secondary | ICD-10-CM

## 2024-01-30 DIAGNOSIS — F17293 Nicotine dependence, other tobacco product, with withdrawal: Secondary | ICD-10-CM | POA: Diagnosis not present

## 2024-01-30 DIAGNOSIS — Z823 Family history of stroke: Secondary | ICD-10-CM

## 2024-01-30 DIAGNOSIS — Z781 Physical restraint status: Secondary | ICD-10-CM

## 2024-01-30 DIAGNOSIS — R001 Bradycardia, unspecified: Secondary | ICD-10-CM | POA: Diagnosis not present

## 2024-01-30 DIAGNOSIS — R471 Dysarthria and anarthria: Secondary | ICD-10-CM | POA: Diagnosis present

## 2024-01-30 DIAGNOSIS — M25512 Pain in left shoulder: Secondary | ICD-10-CM | POA: Diagnosis present

## 2024-01-30 DIAGNOSIS — S0093XA Contusion of unspecified part of head, initial encounter: Secondary | ICD-10-CM

## 2024-01-30 DIAGNOSIS — T410X5A Adverse effect of inhaled anesthetics, initial encounter: Secondary | ICD-10-CM | POA: Diagnosis not present

## 2024-01-30 DIAGNOSIS — K219 Gastro-esophageal reflux disease without esophagitis: Secondary | ICD-10-CM | POA: Diagnosis present

## 2024-01-30 DIAGNOSIS — K5900 Constipation, unspecified: Secondary | ICD-10-CM | POA: Diagnosis not present

## 2024-01-30 LAB — CBC
HCT: 27.2 % — ABNORMAL LOW (ref 39.0–52.0)
Hemoglobin: 9.3 g/dL — ABNORMAL LOW (ref 13.0–17.0)
MCH: 31.4 pg (ref 26.0–34.0)
MCHC: 34.2 g/dL (ref 30.0–36.0)
MCV: 91.9 fL (ref 80.0–100.0)
Platelets: 127 10*3/uL — ABNORMAL LOW (ref 150–400)
RBC: 2.96 MIL/uL — ABNORMAL LOW (ref 4.22–5.81)
RDW: 15.2 % (ref 11.5–15.5)
WBC: 7.8 10*3/uL (ref 4.0–10.5)
nRBC: 0 % (ref 0.0–0.2)

## 2024-01-30 LAB — COMPREHENSIVE METABOLIC PANEL WITH GFR
ALT: 11 U/L (ref 0–44)
AST: 22 U/L (ref 15–41)
Albumin: 3.4 g/dL — ABNORMAL LOW (ref 3.5–5.0)
Alkaline Phosphatase: 34 U/L — ABNORMAL LOW (ref 38–126)
Anion gap: 10 (ref 5–15)
BUN: 20 mg/dL (ref 8–23)
CO2: 26 mmol/L (ref 22–32)
Calcium: 8.5 mg/dL — ABNORMAL LOW (ref 8.9–10.3)
Chloride: 96 mmol/L — ABNORMAL LOW (ref 98–111)
Creatinine, Ser: 1.03 mg/dL (ref 0.61–1.24)
GFR, Estimated: >60 mL/min (ref >60)
Glucose, Bld: 134 mg/dL — ABNORMAL HIGH (ref 70–99)
Potassium: 3.3 mmol/L — ABNORMAL LOW (ref 3.5–5.1)
Sodium: 132 mmol/L — ABNORMAL LOW (ref 135–145)
Total Bilirubin: 1.2 mg/dL (ref 0.0–1.2)
Total Protein: 6.5 g/dL (ref 6.5–8.1)

## 2024-01-30 LAB — PROTIME-INR
INR: 1.1 (ref 0.8–1.2)
Prothrombin Time: 14.4 s (ref 11.4–15.2)

## 2024-01-30 LAB — RAPID URINE DRUG SCREEN, HOSP PERFORMED
Amphetamines: NOT DETECTED
Barbiturates: NOT DETECTED
Benzodiazepines: POSITIVE — AB
Cocaine: NOT DETECTED
Opiates: NOT DETECTED
Tetrahydrocannabinol: NOT DETECTED

## 2024-01-30 LAB — RESP PANEL BY RT-PCR (RSV, FLU A&B, COVID)  RVPGX2
Influenza A by PCR: NEGATIVE
Influenza B by PCR: NEGATIVE
Resp Syncytial Virus by PCR: NEGATIVE
SARS Coronavirus 2 by RT PCR: NEGATIVE

## 2024-01-30 LAB — CBG MONITORING, ED: Glucose-Capillary: 143 mg/dL — ABNORMAL HIGH (ref 70–99)

## 2024-01-30 LAB — URINALYSIS, ROUTINE W REFLEX MICROSCOPIC
Bilirubin Urine: NEGATIVE
Glucose, UA: NEGATIVE mg/dL
Hgb urine dipstick: NEGATIVE
Ketones, ur: 5 mg/dL — AB
Leukocytes,Ua: NEGATIVE
Nitrite: NEGATIVE
Protein, ur: NEGATIVE mg/dL
Specific Gravity, Urine: 1.016 (ref 1.005–1.030)
pH: 6 (ref 5.0–8.0)

## 2024-01-30 LAB — I-STAT CG4 LACTIC ACID, ED: Lactic Acid, Venous: 2.1 mmol/L (ref 0.5–1.9)

## 2024-01-30 LAB — TSH: TSH: 48.029 u[IU]/mL — ABNORMAL HIGH (ref 0.350–4.500)

## 2024-01-30 LAB — ETHANOL: Alcohol, Ethyl (B): <15 mg/dL (ref <15)

## 2024-01-30 LAB — MAGNESIUM: Magnesium: 1.5 mg/dL — ABNORMAL LOW (ref 1.7–2.4)

## 2024-01-30 LAB — AMMONIA: Ammonia: 19 umol/L (ref 9–35)

## 2024-01-30 LAB — VALPROIC ACID LEVEL: Valproic Acid Lvl: 99 ug/mL (ref 50–100)

## 2024-01-30 MED ORDER — ARFORMOTEROL TARTRATE 15 MCG/2ML IN NEBU
15.0000 ug | INHALATION_SOLUTION | Freq: Two times a day (BID) | RESPIRATORY_TRACT | Status: DC
  Administered 2024-01-31 – 2024-02-17 (×29): 15 ug via RESPIRATORY_TRACT
  Filled 2024-01-30 (×46): qty 2

## 2024-01-30 MED ORDER — LACTATED RINGERS IV BOLUS
1000.0000 mL | Freq: Once | INTRAVENOUS | Status: AC
  Administered 2024-01-30: 1000 mL via INTRAVENOUS

## 2024-01-30 MED ORDER — HYDROCORTISONE SOD SUC (PF) 100 MG IJ SOLR
200.0000 mg | Freq: Once | INTRAMUSCULAR | Status: AC
  Administered 2024-01-31: 200 mg via INTRAVENOUS
  Filled 2024-01-30: qty 4

## 2024-01-30 MED ORDER — LEVOTHYROXINE SODIUM 100 MCG/5ML IV SOLN
75.0000 ug | Freq: Every day | INTRAVENOUS | Status: DC
  Administered 2024-01-31: 75 ug via INTRAVENOUS
  Filled 2024-01-30: qty 5

## 2024-01-30 MED ORDER — BUDESONIDE 0.25 MG/2ML IN SUSP
0.2500 mg | Freq: Two times a day (BID) | RESPIRATORY_TRACT | Status: DC
  Administered 2024-01-31 – 2024-02-17 (×29): 0.25 mg via RESPIRATORY_TRACT
  Filled 2024-01-30 (×46): qty 2

## 2024-01-30 MED ORDER — PANTOPRAZOLE SODIUM 40 MG IV SOLR
40.0000 mg | Freq: Every day | INTRAVENOUS | Status: DC
  Administered 2024-01-31 – 2024-02-11 (×13): 40 mg via INTRAVENOUS
  Filled 2024-01-30 (×13): qty 10

## 2024-01-30 MED ORDER — REVEFENACIN 175 MCG/3ML IN SOLN
175.0000 ug | Freq: Every day | RESPIRATORY_TRACT | Status: DC
  Administered 2024-01-31 – 2024-02-17 (×14): 175 ug via RESPIRATORY_TRACT
  Filled 2024-01-30 (×25): qty 3

## 2024-01-30 MED ORDER — LEVOTHYROXINE SODIUM 100 MCG/5ML IV SOLN
200.0000 ug | Freq: Once | INTRAVENOUS | Status: AC
  Administered 2024-01-30: 200 ug via INTRAVENOUS
  Filled 2024-01-30: qty 10

## 2024-01-30 MED ORDER — MAGNESIUM SULFATE IN D5W 1-5 GM/100ML-% IV SOLN
1.0000 g | Freq: Once | INTRAVENOUS | Status: AC
  Administered 2024-01-30: 1 g via INTRAVENOUS
  Filled 2024-01-30: qty 100

## 2024-01-30 MED ORDER — DOCUSATE SODIUM 100 MG PO CAPS
100.0000 mg | ORAL_CAPSULE | Freq: Two times a day (BID) | ORAL | Status: DC | PRN
  Administered 2024-02-22 – 2024-02-24 (×3): 100 mg via ORAL
  Filled 2024-01-30 (×4): qty 1

## 2024-01-30 MED ORDER — INSULIN ASPART 100 UNIT/ML IJ SOLN
0.0000 [IU] | INTRAMUSCULAR | Status: DC
  Administered 2024-01-31: 2 [IU] via SUBCUTANEOUS
  Administered 2024-01-31 (×3): 1 [IU] via SUBCUTANEOUS
  Administered 2024-01-31 – 2024-02-01 (×2): 2 [IU] via SUBCUTANEOUS
  Administered 2024-02-01: 3 [IU] via SUBCUTANEOUS
  Administered 2024-02-01: 2 [IU] via SUBCUTANEOUS
  Administered 2024-02-01: 3 [IU] via SUBCUTANEOUS
  Administered 2024-02-01: 2 [IU] via SUBCUTANEOUS
  Administered 2024-02-01: 1 [IU] via SUBCUTANEOUS
  Administered 2024-02-02 (×3): 2 [IU] via SUBCUTANEOUS
  Administered 2024-02-02: 3 [IU] via SUBCUTANEOUS
  Administered 2024-02-02 – 2024-02-03 (×4): 2 [IU] via SUBCUTANEOUS
  Administered 2024-02-03: 3 [IU] via SUBCUTANEOUS
  Administered 2024-02-03 – 2024-02-05 (×10): 2 [IU] via SUBCUTANEOUS
  Administered 2024-02-05 – 2024-02-06 (×3): 1 [IU] via SUBCUTANEOUS
  Administered 2024-02-06 (×2): 2 [IU] via SUBCUTANEOUS
  Administered 2024-02-06 – 2024-02-07 (×3): 1 [IU] via SUBCUTANEOUS
  Administered 2024-02-07 (×5): 2 [IU] via SUBCUTANEOUS
  Administered 2024-02-08: 1 [IU] via SUBCUTANEOUS
  Administered 2024-02-08 (×2): 2 [IU] via SUBCUTANEOUS
  Administered 2024-02-08: 1 [IU] via SUBCUTANEOUS
  Administered 2024-02-08: 2 [IU] via SUBCUTANEOUS
  Administered 2024-02-08 – 2024-02-09 (×4): 1 [IU] via SUBCUTANEOUS
  Administered 2024-02-09: 2 [IU] via SUBCUTANEOUS
  Administered 2024-02-09: 1 [IU] via SUBCUTANEOUS
  Administered 2024-02-10 (×6): 2 [IU] via SUBCUTANEOUS
  Administered 2024-02-10 – 2024-02-11 (×3): 1 [IU] via SUBCUTANEOUS
  Administered 2024-02-11 (×3): 2 [IU] via SUBCUTANEOUS
  Administered 2024-02-12: 1 [IU] via SUBCUTANEOUS
  Administered 2024-02-12: 2 [IU] via SUBCUTANEOUS
  Administered 2024-02-12 – 2024-02-14 (×8): 1 [IU] via SUBCUTANEOUS
  Administered 2024-02-14: 2 [IU] via SUBCUTANEOUS
  Administered 2024-02-14: 1 [IU] via SUBCUTANEOUS
  Administered 2024-02-14: 2 [IU] via SUBCUTANEOUS
  Administered 2024-02-14: 1 [IU] via SUBCUTANEOUS
  Administered 2024-02-15: 2 [IU] via SUBCUTANEOUS
  Administered 2024-02-15 – 2024-02-16 (×9): 1 [IU] via SUBCUTANEOUS
  Administered 2024-02-16 (×2): 2 [IU] via SUBCUTANEOUS
  Administered 2024-02-17: 5 [IU] via SUBCUTANEOUS
  Administered 2024-02-17: 1 [IU] via SUBCUTANEOUS
  Administered 2024-02-17: 2 [IU] via SUBCUTANEOUS
  Administered 2024-02-17: 1 [IU] via SUBCUTANEOUS
  Administered 2024-02-17: 2 [IU] via SUBCUTANEOUS
  Administered 2024-02-18 (×2): 1 [IU] via SUBCUTANEOUS

## 2024-01-30 MED ORDER — POLYETHYLENE GLYCOL 3350 17 G PO PACK
17.0000 g | PACK | Freq: Every day | ORAL | Status: DC | PRN
  Administered 2024-02-19: 17 g via ORAL
  Filled 2024-01-30: qty 1

## 2024-01-30 NOTE — ED Provider Notes (Addendum)
 [place] EMERGENCY DEPARTMENT AT [facility] Provider Note   CSN: [id number] Arrival date & time: 01/30/24  1941     History  Chief Complaint  Patient presents with   Fall   Altered Mental Status    [name] is a 63 y.o. male.  Pt with generalized weakness and multiple falls in past week.  Patient very limited historian - level 5 caveat. Pt with old bruising to face/head (purple, turning yellow), and left shoulder area.  Temp noted to be low 94. No report of fevers or other specific physical c/o.   The history is provided by the patient, the EMS personnel and medical records. The history is limited by the condition of the patient.  Fall  Altered Mental Status      Home Medications Prior to Admission medications   Medication Sig Start Date End Date Taking? Authorizing Provider  albuterol  (PROVENTIL  HFA;VENTOLIN  HFA) 108 (90 BASE) MCG/ACT inhaler Inhale 2 puffs into the lungs every 4 (four) hours as needed for wheezing or shortness of breath.   Yes [provider]  alprazolam  (XANAX ) 2 MG tablet Take 0.5 tablets (1 mg total) by mouth 2 (two) times daily. Patient taking differently: Take 2-4 mg by mouth See admin instructions. Take 4 mg by mouth at bedtime and an additional 2 mg once a day as needed for anxiety 11/03/15  Yes Jenean Minus, DO  citalopram  (CELEXA ) 20 MG tablet Take 20 mg by mouth daily.  02/25/15  Yes [provider]  divalproex  (DEPAKOTE  ER) 500 MG 24 hr tablet Take 500 mg by mouth in the morning and at bedtime.   Yes [provider]  levothyroxine  (SYNTHROID , LEVOTHROID) 150 MCG tablet Take 150 mcg by mouth daily before breakfast.  12/30/14  Yes [provider]  metFORMIN (GLUCOPHAGE-XR) 500 MG 24 hr tablet Take 500 mg by mouth in the morning and at bedtime. 02/19/19  Yes [provider]  traZODone  (DESYREL ) 100 MG tablet Take 200 mg by mouth at bedtime.   Yes [provider]  TRELEGY ELLIPTA  100-62.5-25 MCG/ACT AEPB Inhale 1 puff into the lungs daily. 01/21/23  Yes [provider]  Ascorbic Acid (VITAMIN C PO) Take 1 tablet by mouth daily.    [provider]  Calcium  Carb-Cholecalciferol (CALCIUM  HIGH POTENCY/VITAMIN D) 600-5 MG-MCG TABS Take 1 tablet by mouth daily. 05/16/21   [provider]  omeprazole (PRILOSEC) 20 MG capsule Take 20 mg by mouth daily.    [provider]      Allergies    Levetiracetam , Tegretol [carbamazepine], Penicillins, and Topiramate    Review of Systems   Review of Systems  Unable to perform ROS: Mental status change    Physical Exam Updated Vital Signs BP (!) 161/79 (BP Location: Right Arm)   Pulse (!) 43   Temp (!) 93.6 F (34.2 C) (Rectal)   Resp 13   Ht 1.676 m (5\' 6" )   Wt 93.7 kg   SpO2 100%   BMI 33.34 kg/m  Physical Exam Vitals and nursing note reviewed.  Constitutional:      Appearance: Normal appearance. He is well-developed.  HENT:     Head:     Comments: Yellowish bruising to face    Nose: Nose normal.     Mouth/Throat:     Mouth: Mucous membranes are moist.     Pharynx: Oropharynx is clear.  Eyes:     General: No scleral icterus.    Conjunctiva/sclera: Conjunctivae normal.  Pupils: Pupils are equal, round, and reactive to light.  Neck:     Vascular: No carotid bruit.     Trachea: No tracheal deviation.     Comments: Trachea midline, thyroid  not grossly enlarged or tender. No neck stiffness or rigidity.  Cardiovascular:     Rate and Rhythm: Regular rhythm. Bradycardia present.     Pulses: Normal pulses.     Heart sounds: Normal heart sounds. No murmur heard.    No friction rub. No gallop.  Pulmonary:     Effort: Pulmonary effort is normal. No accessory muscle usage or respiratory distress.     Breath sounds: Normal breath sounds.  Abdominal:     General: Bowel sounds are normal. There is no distension.     Palpations: Abdomen is soft.     Tenderness: There is no abdominal  tenderness. There is no guarding.  Genitourinary:    Comments: No cva tenderness. Musculoskeletal:        General: No swelling.     Cervical back: Normal range of motion and neck supple. No rigidity or tenderness.     Comments: CTLS spine, non tender, aligned, no step off. Tenderness left shoulder and left hip, no gross deformity noted. No other focal pain or bony tenderness on bilateral extremity exam.   Skin:    General: Skin is warm and dry.     Findings: No rash.  Neurological:     Mental Status: He is alert.     Comments: Lethargic, slow to respond. Moves bil extremities purposefully with good strength.      ED Results / Procedures / Treatments   Labs (all labs ordered are listed, but only abnormal results are displayed) Results for orders placed or performed during the hospital encounter of 01/30/24  Comprehensive metabolic panel   Collection Time: 01/30/24  7:52 PM  Result Value Ref Range   Sodium 132 (L) 135 - 145 mmol/L   Potassium 3.3 (L) 3.5 - 5.1 mmol/L   Chloride 96 (L) 98 - 111 mmol/L   CO2 26 22 - 32 mmol/L   Glucose, Bld 134 (H) 70 - 99 mg/dL   BUN 20 8 - 23 mg/dL   Creatinine, Ser 7.82 0.61 - 1.24 mg/dL   Calcium  8.5 (L) 8.9 - 10.3 mg/dL   Total Protein 6.5 6.5 - 8.1 g/dL   Albumin 3.4 (L) 3.5 - 5.0 g/dL   AST 22 15 - 41 U/L   ALT 11 0 - 44 U/L   Alkaline Phosphatase 34 (L) 38 - 126 U/L   Total Bilirubin 1.2 0.0 - 1.2 mg/dL   GFR, Estimated >95 >62 mL/min   Anion gap 10 5 - 15  CBC   Collection Time: 01/30/24  7:52 PM  Result Value Ref Range   WBC 7.8 4.0 - 10.5 K/uL   RBC 2.96 (L) 4.22 - 5.81 MIL/uL   Hemoglobin 9.3 (L) 13.0 - 17.0 g/dL   HCT 13.0 (L) 86.5 - 78.4 %   MCV 91.9 80.0 - 100.0 fL   MCH 31.4 26.0 - 34.0 pg   MCHC 34.2 30.0 - 36.0 g/dL   RDW 69.6 29.5 - 28.4 %   Platelets 127 (L) 150 - 400 K/uL   nRBC 0.0 0.0 - 0.2 %  Rapid urine drug screen (hospital performed)   Collection Time: 01/30/24  8:03 PM  Result Value Ref Range   Opiates  NONE DETECTED NONE DETECTED   Cocaine NONE DETECTED NONE DETECTED   Benzodiazepines POSITIVE (A) NONE  DETECTED   Amphetamines NONE DETECTED NONE DETECTED   Tetrahydrocannabinol NONE DETECTED NONE DETECTED   Barbiturates NONE DETECTED NONE DETECTED  CBG monitoring, ED   Collection Time: 01/30/24  8:05 PM  Result Value Ref Range   Glucose-Capillary 143 (H) 70 - 99 mg/dL   Comment 1 Notify RN    Comment 2 Document in Chart   Resp panel by RT-PCR (RSV, Flu A&B, Covid) Anterior Nasal Swab   Collection Time: 01/30/24  8:10 PM   Specimen: Anterior Nasal Swab  Result Value Ref Range   SARS Coronavirus 2 by RT PCR NEGATIVE NEGATIVE   Influenza A by PCR NEGATIVE NEGATIVE   Influenza B by PCR NEGATIVE NEGATIVE   Resp Syncytial Virus by PCR NEGATIVE NEGATIVE  I-Stat CG4 Lactic Acid   Collection Time: 01/30/24  8:20 PM  Result Value Ref Range   Lactic Acid, Venous 2.1 (HH) 0.5 - 1.9 mmol/L   Comment NOTIFIED PHYSICIAN   Urinalysis, Routine w reflex microscopic -Urine, Clean Catch   Collection Time: 01/30/24  8:23 PM  Result Value Ref Range   Color, Urine YELLOW YELLOW   APPearance CLEAR CLEAR   Specific Gravity, Urine 1.016 1.005 - 1.030   pH 6.0 5.0 - 8.0   Glucose, UA NEGATIVE NEGATIVE mg/dL   Hgb urine dipstick NEGATIVE NEGATIVE   Bilirubin Urine NEGATIVE NEGATIVE   Ketones, ur 5 (A) NEGATIVE mg/dL   Protein, ur NEGATIVE NEGATIVE mg/dL   Nitrite NEGATIVE NEGATIVE   Leukocytes,Ua NEGATIVE NEGATIVE  Valproic acid  level   Collection Time: 01/30/24  8:32 PM  Result Value Ref Range   Valproic Acid  Lvl 99 50 - 100 ug/mL  Ethanol   Collection Time: 01/30/24  8:32 PM  Result Value Ref Range   Alcohol, Ethyl (B) <15 <15 mg/dL  Ammonia   Collection Time: 01/30/24  8:32 PM  Result Value Ref Range   Ammonia 19 9 - 35 umol/L  TSH   Collection Time: 01/30/24  8:32 PM  Result Value Ref Range   TSH 48.029 (H) 0.350 - 4.500 uIU/mL  Magnesium   Collection Time: 01/30/24  8:32 PM  Result  Value Ref Range   Magnesium 1.5 (L) 1.7 - 2.4 mg/dL  Protime-INR   Collection Time: 01/30/24  8:32 PM  Result Value Ref Range   Prothrombin Time 14.4 11.4 - 15.2 seconds   INR 1.1 0.8 - 1.2   CT Head Wo Contrast Addendum Date: 01/30/2024 ADDENDUM REPORT: 01/30/2024 22:04 ADDENDUM: Findings discussed with Dr. Moses Arenas via telephone at 9:58 p.m. Electronically Signed   By: Stevenson Elbe M.D.   On: 01/30/2024 22:04   Result Date: 01/30/2024 CLINICAL DATA:  Head trauma, minor (Age >= 65y); Neck trauma (Age >= 65y). EXAM: CT HEAD WITHOUT CONTRAST CT CERVICAL SPINE WITHOUT CONTRAST TECHNIQUE: Multidetector CT imaging of the head and cervical spine was performed following the standard protocol without intravenous contrast. Multiplanar CT image reconstructions of the cervical spine were also generated. RADIATION DOSE REDUCTION: This exam was performed according to the departmental dose-optimization program which includes automated exposure control, adjustment of the mA and/or kV according to patient size and/or use of iterative reconstruction technique. COMPARISON:  None Available. CT head 09/06/2023. FINDINGS: CT HEAD FINDINGS Brain: Acute 9 mm thick right cerebral convexity subdural hemorrhage with hemorrhage measuring up to 6 mm tracking along the right tentorial leaflet. Resulting 3 mm leftward midline shift. Scattered small volume of acute subarachnoid hemorrhage including hemorrhage along the right basal cisterns. No evidence of acute  large vascular territory infarct, mass lesion, or hydrocephalus. Vascular: No hyperdense vessel. Skull: No acute fracture. Sinuses/Orbits: Clear sinuses.  No acute orbital findings. CT CERVICAL SPINE FINDINGS Alignment: No substantial sagittal subluxation. Skull base and vertebrae: No acute fracture. Vertebral body heights are maintained. Soft tissues and spinal canal: No prevertebral fluid or swelling. No visible canal hematoma. Disc levels:  Mild bony degenerative change.  Upper chest: Visualized lung apices are clear. Emphysema. Aortic atherosclerosis. IMPRESSION: 1. Acute 9 mm thick right cerebral convexity subdural hemorrhage which extends along the right tentorial leaflet. Resulting 3 mm leftward midline shift. 2. Scattered small volume of acute subarachnoid hemorrhage. 3. No evidence of acute fracture or traumatic malalignment in the cervical spine. Electronically Signed: By: Stevenson Elbe M.D. On: 01/30/2024 21:53   CT Cervical Spine Wo Contrast Addendum Date: 01/30/2024 ADDENDUM REPORT: 01/30/2024 22:04 ADDENDUM: Findings discussed with Dr. Moses Arenas via telephone at 9:58 p.m. Electronically Signed   By: Stevenson Elbe M.D.   On: 01/30/2024 22:04   Result Date: 01/30/2024 CLINICAL DATA:  Head trauma, minor (Age >= 65y); Neck trauma (Age >= 65y). EXAM: CT HEAD WITHOUT CONTRAST CT CERVICAL SPINE WITHOUT CONTRAST TECHNIQUE: Multidetector CT imaging of the head and cervical spine was performed following the standard protocol without intravenous contrast. Multiplanar CT image reconstructions of the cervical spine were also generated. RADIATION DOSE REDUCTION: This exam was performed according to the departmental dose-optimization program which includes automated exposure control, adjustment of the mA and/or kV according to patient size and/or use of iterative reconstruction technique. COMPARISON:  None Available. CT head 09/06/2023. FINDINGS: CT HEAD FINDINGS Brain: Acute 9 mm thick right cerebral convexity subdural hemorrhage with hemorrhage measuring up to 6 mm tracking along the right tentorial leaflet. Resulting 3 mm leftward midline shift. Scattered small volume of acute subarachnoid hemorrhage including hemorrhage along the right basal cisterns. No evidence of acute large vascular territory infarct, mass lesion, or hydrocephalus. Vascular: No hyperdense vessel. Skull: No acute fracture. Sinuses/Orbits: Clear sinuses.  No acute orbital findings. CT CERVICAL SPINE  FINDINGS Alignment: No substantial sagittal subluxation. Skull base and vertebrae: No acute fracture. Vertebral body heights are maintained. Soft tissues and spinal canal: No prevertebral fluid or swelling. No visible canal hematoma. Disc levels:  Mild bony degenerative change. Upper chest: Visualized lung apices are clear. Emphysema. Aortic atherosclerosis. IMPRESSION: 1. Acute 9 mm thick right cerebral convexity subdural hemorrhage which extends along the right tentorial leaflet. Resulting 3 mm leftward midline shift. 2. Scattered small volume of acute subarachnoid hemorrhage. 3. No evidence of acute fracture or traumatic malalignment in the cervical spine. Electronically Signed: By: Stevenson Elbe M.D. On: 01/30/2024 21:53   DG Chest 1 View Result Date: 01/30/2024 CLINICAL DATA:  Fall EXAM: CHEST  1 VIEW COMPARISON:  09/06/2023 FINDINGS: Heart and mediastinal contours are within normal limits. No focal opacities or effusions. No acute bony abnormality. IMPRESSION: No active disease. Electronically Signed   By: Janeece Mechanic M.D.   On: 01/30/2024 21:38   DG HIP UNILAT W OR W/O PELVIS 2-3 VIEWS LEFT Result Date: 01/30/2024 CLINICAL DATA:  Fall, pain EXAM: DG HIP (WITH OR WITHOUT PELVIS) 2-3V LEFT COMPARISON:  None Available. FINDINGS: There is no evidence of hip fracture or dislocation. There is no evidence of arthropathy or other focal bone abnormality. IMPRESSION: Negative. Electronically Signed   By: Janeece Mechanic M.D.   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 No acute fracture, subluxation or dislocation. Early degenerative changes in the [facility] and glenohumeral joints. 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 No acute bony abnormality. Electronically Signed   By: Janeece Mechanic M.D.   On: 01/30/2024 21:35     EKG EKG Interpretation Date/Time:  Thursday Jan 30 2024 20:17:13 EDT Ventricular Rate:  46 PR Interval:  214 QRS Duration:  106 QT Interval:  498 QTC Calculation: 436 R Axis:   40  Text Interpretation: Sinus bradycardia Sinus arrhythmia Non-specific ST-t changes Confirmed by Guadalupe Lee ([id number]) on 01/30/2024 8:46:32 PM  Radiology CT Head Wo Contrast Addendum Date: 01/30/2024 ADDENDUM REPORT: 01/30/2024 22:04 ADDENDUM: Findings discussed with Dr. Moses Arenas via telephone at 9:58 p.m. Electronically Signed   By: Stevenson Elbe M.D.   On: 01/30/2024 22:04   Result Date: 01/30/2024 CLINICAL DATA:  Head trauma, minor (Age >= 65y); Neck trauma (Age >= 65y). EXAM: CT HEAD WITHOUT CONTRAST CT CERVICAL SPINE WITHOUT CONTRAST TECHNIQUE: Multidetector CT imaging of the head and cervical spine was performed following the standard protocol without intravenous contrast. Multiplanar CT image reconstructions of the cervical spine were also generated. RADIATION DOSE REDUCTION: This exam was performed according to the departmental dose-optimization program which includes automated exposure control, adjustment of the mA and/or kV according to patient size and/or use of iterative reconstruction technique. COMPARISON:  None Available. CT head 09/06/2023. FINDINGS: CT HEAD FINDINGS Brain: Acute 9 mm thick right cerebral convexity subdural hemorrhage with hemorrhage measuring up to 6 mm tracking along the right tentorial leaflet. Resulting 3 mm leftward midline shift. Scattered small volume of acute subarachnoid hemorrhage including hemorrhage along the right basal cisterns. No evidence of acute large vascular territory infarct, mass lesion, or hydrocephalus. Vascular: No hyperdense vessel. Skull: No acute fracture. Sinuses/Orbits: Clear sinuses.  No acute orbital findings. CT CERVICAL SPINE FINDINGS Alignment: No substantial sagittal subluxation. Skull base and vertebrae: No acute  fracture. Vertebral body heights are maintained. Soft tissues and spinal canal: No prevertebral fluid or swelling. No visible canal hematoma. Disc levels:  Mild bony degenerative change. Upper chest: Visualized lung apices are clear. Emphysema. Aortic atherosclerosis. IMPRESSION: 1. Acute 9 mm thick right cerebral convexity subdural hemorrhage which extends along the right tentorial leaflet. Resulting 3 mm leftward midline shift. 2. Scattered small volume of acute subarachnoid hemorrhage. 3. No evidence of acute fracture or traumatic malalignment in the cervical spine. Electronically Signed: By: Stevenson Elbe M.D. On: 01/30/2024 21:53   CT Cervical Spine Wo Contrast Addendum Date: 01/30/2024 ADDENDUM REPORT: 01/30/2024 22:04 ADDENDUM: Findings discussed with Dr. Moses Arenas via telephone at 9:58 p.m. Electronically Signed   By: Stevenson Elbe M.D.   On: 01/30/2024 22:04   Result Date: 01/30/2024 CLINICAL DATA:  Head trauma, minor (Age >= 65y); Neck trauma (Age >= 65y). EXAM: CT HEAD WITHOUT CONTRAST CT CERVICAL SPINE WITHOUT CONTRAST TECHNIQUE: Multidetector CT imaging of the head and cervical spine was performed following the standard protocol without intravenous contrast. Multiplanar CT image reconstructions of the cervical spine were also generated. RADIATION DOSE REDUCTION: This exam was performed according to the departmental dose-optimization program which includes automated exposure control, adjustment of the mA and/or kV according to patient size and/or use of iterative reconstruction technique. COMPARISON:  None Available. CT head 09/06/2023. FINDINGS: CT HEAD FINDINGS Brain: Acute 9 mm thick right cerebral convexity subdural hemorrhage with hemorrhage measuring up to 6  mm tracking along the right tentorial leaflet. Resulting 3 mm leftward midline shift. Scattered small volume of acute subarachnoid hemorrhage including hemorrhage along the right basal cisterns. No evidence of acute large vascular  territory infarct, mass lesion, or hydrocephalus. Vascular: No hyperdense vessel. Skull: No acute fracture. Sinuses/Orbits: Clear sinuses.  No acute orbital findings. CT CERVICAL SPINE FINDINGS Alignment: No substantial sagittal subluxation. Skull base and vertebrae: No acute fracture. Vertebral body heights are maintained. Soft tissues and spinal canal: No prevertebral fluid or swelling. No visible canal hematoma. Disc levels:  Mild bony degenerative change. Upper chest: Visualized lung apices are clear. Emphysema. Aortic atherosclerosis. IMPRESSION: 1. Acute 9 mm thick right cerebral convexity subdural hemorrhage which extends along the right tentorial leaflet. Resulting 3 mm leftward midline shift. 2. Scattered small volume of acute subarachnoid hemorrhage. 3. No evidence of acute fracture or traumatic malalignment in the cervical spine. Electronically Signed: By: Stevenson Elbe M.D. On: 01/30/2024 21:53   DG Chest 1 View Result Date: 01/30/2024 CLINICAL DATA:  Fall EXAM: CHEST  1 VIEW COMPARISON:  09/06/2023 FINDINGS: Heart and mediastinal contours are within normal limits. No focal opacities or effusions. No acute bony abnormality. IMPRESSION: No active disease. Electronically Signed   By: Janeece Mechanic M.D.   On: 01/30/2024 21:38   DG HIP UNILAT W OR W/O PELVIS 2-3 VIEWS LEFT Result Date: 01/30/2024 CLINICAL DATA:  Fall, pain EXAM: DG HIP (WITH OR WITHOUT PELVIS) 2-3V LEFT COMPARISON:  None Available. FINDINGS: There is no evidence of hip fracture or dislocation. There is no evidence of arthropathy or other focal bone abnormality. IMPRESSION: Negative. Electronically Signed   By: Janeece Mechanic M.D.   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 No acute fracture, subluxation or dislocation. Early degenerative  changes in the [facility] and glenohumeral joints. 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 No acute bony abnormality. Electronically Signed   By: Janeece Mechanic M.D.   On: 01/30/2024 21:35    Procedures Procedures    Medications Ordered in ED Medications  magnesium sulfate IVPB 1 g 100 mL (1 g Intravenous New Bag/Given 01/30/24 2227)  hydrocortisone sodium succinate (SOLU-CORTEF) 100 MG injection 200 mg (has no administration in time range)  levothyroxine  (SYNTHROID , LEVOTHROID) injection 200 mcg (has no administration in time range)  lactated ringers bolus 1,000 mL (0 mLs Intravenous Stopped 01/30/24 2233)    ED Course/ Medical Decision Making/ A&P                                 Medical Decision Making Problems Addressed: Anemia, unspecified type: acute illness or injury Contusion of head, initial encounter: acute illness or injury with systemic symptoms that poses a threat to life or bodily functions Frequent falls: acute illness or injury with systemic symptoms that poses a threat to life or bodily functions Generalized weakness: acute illness or injury with systemic symptoms that poses a threat to life or bodily functions Hypokalemia: acute illness or injury Hypothermia, initial encounter: acute illness or injury with systemic symptoms Polypharmacy: chronic illness or injury Sinus bradycardia: acute illness or injury Subdural hematoma (HCC): acute illness or injury with systemic symptoms that poses a threat to life or bodily functions Thrombocytopenia (HCC): acute illness or  injury  Amount and/or Complexity of Data Reviewed Independent Historian: EMS    Details: hx External Data Reviewed: labs and notes. Labs: ordered. Decision-making details documented in ED Course. Radiology: ordered and independent interpretation performed. Decision-making details documented in ED Course. ECG/medicine tests: ordered and independent interpretation  performed. Decision-making details documented in ED Course. Discussion of management or test interpretation with external provider(s): Critical care, neurosurgery  Risk Prescription drug management. Decision regarding hospitalization.   Iv ns. Continuous pulse ox and cardiac monitoring. Labs ordered/sent. Imaging ordered.   Differential diagnosis includes hypoglycemia, encephalopathy, infection, polypharmacy, trauma, etc. Dispo decision including potential need for admission considered - will get labs and imaging and reassess.   Reviewed nursing notes and prior charts for additional history. External reports reviewed. Additional history from: EMS.   Prior ER visit 12/24 and prior admission 2017 for similar type of symptoms - unclear cause then.   LR bolus. Temp low. Glucose ok. [employer].   Cardiac monitor: sinus rhythm, rate 50.  Labs reviewed/interpreted by me - wbc normal. Hgb 9. Chem w k sl low. Mg added. Lactate mildly high, no fever or obvious source of infection. ?volume depletion. Ivf bolus. Additional labs pending.  UA w small ketones, ivf, no infection noted.   Recheck, chest cta. Abd soft nt. No new c/o.   Xrays reviewed/interpreted by me - no acute fx.   CT reviewed/interpreted by me - subdural hematoma. Neurosurgery consulted.   Recheck neuro exam - no acute change or deterioration as compared to initial   CRITICAL CARE RE: acute alteration in mental status, subdural hematoma with midline shift Performed by: Desi Rowe E Vee Bahe Total critical care time: 115 minutes Critical care time was exclusive of separately billable procedures and treating other patients. Critical care was necessary to treat or prevent imminent or life-threatening deterioration. Critical care was time spent personally by me on the following activities: development of treatment plan with patient and/or surrogate as well as nursing, discussions with consultants, evaluation of patient's response to  treatment, examination of patient, obtaining history from patient or surrogate, ordering and performing treatments and interventions, ordering and review of laboratory studies, ordering and review of radiographic studies, pulse oximetry and re-evaluation of patient's condition.   Discussed with neurosurgery - will see in consult., and indicates admit to medicine.   Additional labs reviewed/interpreted by me - tsh v high. 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 Solucortef iv, levothyroxine  iv.  Critical care consulted for admission - discussed with Dr Theodora Fish, including sdh on ct, hypothermia/brady/tsh, etc, - he will see in ED/admit.             Final Clinical Impression(s) / ED Diagnoses Final diagnoses:  Generalized weakness  Frequent falls  Contusion of head, initial encounter  Sinus bradycardia  Hypothermia, initial encounter  Anemia, unspecified type  Hypokalemia  Thrombocytopenia (HCC)  Polypharmacy  Subdural hematoma (HCC)    Rx / DC Orders ED Discharge Orders     None          Guadalupe Lee, MD 01/30/24 2258

## 2024-01-30 NOTE — H&P (Addendum)
 NAME:  [name], MRN:  [id number], DOB:  12/21/1960, LOS: 0 ADMISSION DATE:  01/30/2024, CONSULTATION DATE:  01/30/2024  REFERRING MD:  [facility], EDP, CHIEF COMPLAINT:  fall, hypothermia   History of Present Illness:  63 year old man who was brought in by EMS from home after a fall.  Apparently wife called, patient is currently ANO x 1 and history obtained after chart review, unable to reach wife.  His heart rate initially was in the 80s, A/O x 1 per EMS, bradycardic to 40s in the ED , noted to be hypothermic 94.  Bruises of multiple ages left hip left eye and left shoulder Labs significant for no leukocytosis, hemoglobin 9.3, mild thrombocytopenia 1 27K, mild hypokalemia 3.3, hypomagnesemia 1.5, normal LFTs.  UDS positive for benzodiazepines, tox screen negative, ammonia 19, TSH 48.  EKG showed sinus bradycardia He was given 1 g of magnesium, 100 mg of IV Solu-Cortef and 200 mics of levothyroxine  IV Head CT showed 9 mm right subdural hematoma with 3 mm midline shift and small volume acute SAH, cervical spine was negative. PCCM consulted for admission  Pertinent  Medical History  Seizure disorder COPD   Significant Hospital Events: Including procedures, antibiotic start and stop dates in addition to other pertinent events     Interim History / Subjective:  Denies chest pain or dyspnea On warming blanket  Objective    Blood pressure (!) 161/79, pulse (!) 43, temperature (!) 93.6 F (34.2 C), temperature source Rectal, resp. rate 13, height 5\' 6"  (1.676 m), weight 93.7 kg, SpO2 100%.        Intake/Output Summary (Last 24 hours) at 01/30/2024 2330 Last data filed at 01/30/2024 2233 Gross per 24 hour  Intake 1000 ml  Output --  Net 1000 ml   Filed Weights   01/30/24 2002  Weight: 93.7 kg    Examination: General: Chronically ill-appearing man lying supine in ED stretcher, with warming blanket HENT: Pupils 3 mm bilaterally equally reactive to light, no icterus, mild pallor, left  eye ecchymosis Lungs: Decreased breath sounds bilateral, no accessory muscle use Cardiovascular: S1-S2 bradycardia, distant, no murmur Abdomen: Soft, nontender, no organomegaly Extremities: 1+ edema, no deformity Neuro: Lethargic, easily arousable oriented x 1 to name, not to place or time, moves all 4 extremities Skin -large ecchymosis left hip, left thigh, small left shoulder  Resolved Hospital Problem list     Assessment & Plan:  Acute encephalopathy, frequent falls, right subdural hematoma with small subarachnoid hemorrhage Severe hypothyroidism/myxedema presenting with bradycardia and hypothermia, likely related to noncompliance with Synthroid  Bradycardia probably related to hypothyroidism rather than intracranial hypertension  -Admit to neuro ICU, frequent neurochecks - Repeat head CT in 24 hours for evolution of SDH - Received IV Synthroid  and Solu-Cortef already , telemetry monitoring for bradycardia and oral Synthroid  once able to take oral - OOB blanket Supportive care   Known seizure disorder, normal Depakote  levels -IV Depakote  until able to take oral oral Hypokalemia and hypomagnesemia will be repleted  COPD -triple therapy nebs in lieu of home Trelegy  Diabetes type 2 -hold metformin, use SSI  Anxiety -chronic benzodiazepine use, resume Celexa  and alprazolam  when able to take oral  Best Practice (right click and "Reselect all SmartList Selections" daily)   Diet/type: NPO DVT prophylaxis SCD Pressure ulcer(s): N/A GI prophylaxis: PPI Lines: N/A Foley:  N/A Code Status:  full code Last date of multidisciplinary goals of care discussion [NA]  Labs   CBC: Recent Labs  Lab 01/30/24 1952  WBC  7.8  HGB 9.3*  HCT 27.2*  MCV 91.9  PLT 127*    Basic Metabolic Panel: Recent Labs  Lab 01/30/24 1952 01/30/24 2032  NA 132*  --   K 3.3*  --   CL 96*  --   CO2 26  --   GLUCOSE 134*  --   BUN 20  --   CREATININE 1.03  --   CALCIUM  8.5*  --   MG  --   1.5*   GFR: Estimated Creatinine Clearance: 77.7 mL/min (by C-G formula based on SCr of 1.03 mg/dL). Recent Labs  Lab 01/30/24 1952 01/30/24 2020  WBC 7.8  --   LATICACIDVEN  --  2.1*    Liver Function Tests: Recent Labs  Lab 01/30/24 1952  AST 22  ALT 11  ALKPHOS 34*  BILITOT 1.2  PROT 6.5  ALBUMIN 3.4*   No results for input(s): "LIPASE", "AMYLASE" in the last 168 hours. Recent Labs  Lab 01/30/24 2032  AMMONIA 19    ABG    Component Value Date/Time   PHART 7.363 11/01/2015 0740   PCO2ART 47.7 (H) 11/01/2015 0740   PO2ART 82.0 11/01/2015 0740   HCO3 27.7 (H) 01/22/2016 2138   TCO2 29 01/22/2016 2138   O2SAT 79.0 01/22/2016 2138     Coagulation Profile: Recent Labs  Lab 01/30/24 2032  INR 1.1    Cardiac Enzymes: No results for input(s): "CKTOTAL", "CKMB", "CKMBINDEX", "TROPONINI" in the last 168 hours.  HbA1C: No results found for: "HGBA1C"  CBG: Recent Labs  Lab 01/30/24 2005  GLUCAP 143*    Review of Systems:   Unable to obtain due to altered mental status  Past Medical History:  He,  has a past medical history of Anemia, Depression, Diabetes mellitus without complication (HCC), Emphysema of lung (HCC), GERD (gastroesophageal reflux disease), Hiatal hernia, Hyperlipidemia, Hypertension, Memory loss, Seizures (HCC), and Thyroid  disease.   Surgical History:   Past Surgical History:  Procedure Laterality Date   APPENDECTOMY     DENTAL SURGERY     EYE SURGERY     TONSILLECTOMY AND ADENOIDECTOMY       Social History:   reports that he has been smoking e-cigarettes and cigarettes. He has never used smokeless tobacco. He reports that he does not drink alcohol and does not use drugs.   Family History:  His family history includes Heart disease in his father; Stroke in his mother; Thyroid  disease in his brother and father.   Allergies Allergies  Allergen Reactions   Levetiracetam  Other (See Comments)    Severe anxiety, agitation,  hyperactivity, very negative disposition   Tegretol [Carbamazepine] Anxiety and Other (See Comments)    Increased seizures/ Induces seizures   Penicillins Other (See Comments)    Reaction not noted   Topiramate Other (See Comments)    Reaction not noted     Home Medications  Prior to Admission medications   Medication Sig Start Date End Date Taking? Authorizing Provider  albuterol  (PROVENTIL  HFA;VENTOLIN  HFA) 108 (90 BASE) MCG/ACT inhaler Inhale 2 puffs into the lungs every 4 (four) hours as needed for wheezing or shortness of breath.   Yes [provider]  alprazolam  (XANAX ) 2 MG tablet Take 0.5 tablets (1 mg total) by mouth 2 (two) times daily. Patient taking differently: Take 2-4 mg by mouth See admin instructions. Take 4 mg by mouth at bedtime and an additional 2 mg once a day as needed for anxiety 11/03/15  Yes Jenean Minus, DO  citalopram  (  CELEXA ) 20 MG tablet Take 20 mg by mouth daily.  02/25/15  Yes [provider]  divalproex  (DEPAKOTE  ER) 500 MG 24 hr tablet Take 500 mg by mouth in the morning and at bedtime.   Yes [provider]  levothyroxine  (SYNTHROID , LEVOTHROID) 150 MCG tablet Take 150 mcg by mouth daily before breakfast.  12/30/14  Yes [provider]  metFORMIN (GLUCOPHAGE-XR) 500 MG 24 hr tablet Take 500 mg by mouth in the morning and at bedtime. 02/19/19  Yes [provider]  traZODone  (DESYREL ) 100 MG tablet Take 200 mg by mouth at bedtime.   Yes [provider]  TRELEGY ELLIPTA 100-62.5-25 MCG/ACT AEPB Inhale 1 puff into the lungs daily. 01/21/23  Yes [provider]  Ascorbic Acid (VITAMIN C PO) Take 1 tablet by mouth daily.    [provider]  Calcium  Carb-Cholecalciferol (CALCIUM  HIGH POTENCY/VITAMIN D) 600-5 MG-MCG TABS Take 1 tablet by mouth daily. 05/16/21   [provider]  omeprazole (PRILOSEC) 20 MG capsule Take 20 mg by mouth daily.    [provider]     Critical care  time: 15m     Celene Coins MD. [facility]. [facility] Pager : [phone]  If no response to pager , please call [phone] until 7 pm After 7:00 pm call Elink  [phone]   01/30/2024

## 2024-01-30 NOTE — ED Triage Notes (Signed)
 Pt BIB [facility] from home c/o a fall, unknown exactly when he fell. Pt is normally alert and oriented per his wife who called but is only alert x1 with EMS. Not sure of LKW. Pt does have a hx of seizures and multiple falls. Pt keeps repeating he is cold. Per EMS pt initially had a HR in the 80's but dropped to high 30's, low 40's.

## 2024-01-30 NOTE — ED Notes (Signed)
 Patient to CT at this time

## 2024-01-30 NOTE — ED Notes (Signed)
 Patient transported to CT

## 2024-01-31 ENCOUNTER — Inpatient Hospital Stay (HOSPITAL_COMMUNITY): Payer: Medicare (Managed Care)

## 2024-01-31 DIAGNOSIS — I10 Essential (primary) hypertension: Secondary | ICD-10-CM

## 2024-01-31 DIAGNOSIS — D696 Thrombocytopenia, unspecified: Secondary | ICD-10-CM | POA: Diagnosis not present

## 2024-01-31 DIAGNOSIS — E035 Myxedema coma: Secondary | ICD-10-CM

## 2024-01-31 DIAGNOSIS — S065XAA Traumatic subdural hemorrhage with loss of consciousness status unknown, initial encounter: Secondary | ICD-10-CM | POA: Diagnosis not present

## 2024-01-31 LAB — ECHOCARDIOGRAM COMPLETE
Area-P 1/2: 2.5 cm2
Height: 66 in
Weight: 3220.48 [oz_av]

## 2024-01-31 LAB — HIV ANTIBODY (ROUTINE TESTING W REFLEX): HIV Screen 4th Generation wRfx: NONREACTIVE

## 2024-01-31 LAB — CBC
HCT: 30.3 % — ABNORMAL LOW (ref 39.0–52.0)
Hemoglobin: 10.7 g/dL — ABNORMAL LOW (ref 13.0–17.0)
MCH: 30.7 pg (ref 26.0–34.0)
MCHC: 35.3 g/dL (ref 30.0–36.0)
MCV: 86.8 fL (ref 80.0–100.0)
Platelets: 147 10*3/uL — ABNORMAL LOW (ref 150–400)
RBC: 3.49 MIL/uL — ABNORMAL LOW (ref 4.22–5.81)
RDW: 14.6 % (ref 11.5–15.5)
WBC: 9.1 10*3/uL (ref 4.0–10.5)
nRBC: 0 % (ref 0.0–0.2)

## 2024-01-31 LAB — COMPREHENSIVE METABOLIC PANEL WITH GFR
ALT: 14 U/L (ref 0–44)
AST: 24 U/L (ref 15–41)
Albumin: 4 g/dL (ref 3.5–5.0)
Alkaline Phosphatase: 44 U/L (ref 38–126)
Anion gap: 13 (ref 5–15)
BUN: 14 mg/dL (ref 8–23)
CO2: 26 mmol/L (ref 22–32)
Calcium: 9 mg/dL (ref 8.9–10.3)
Chloride: 88 mmol/L — ABNORMAL LOW (ref 98–111)
Creatinine, Ser: 0.88 mg/dL (ref 0.61–1.24)
GFR, Estimated: >60 mL/min (ref >60)
Glucose, Bld: 115 mg/dL — ABNORMAL HIGH (ref 70–99)
Potassium: 3.3 mmol/L — ABNORMAL LOW (ref 3.5–5.1)
Sodium: 127 mmol/L — ABNORMAL LOW (ref 135–145)
Total Bilirubin: 1.3 mg/dL — ABNORMAL HIGH (ref 0.0–1.2)
Total Protein: 8 g/dL (ref 6.5–8.1)

## 2024-01-31 LAB — PHOSPHORUS: Phosphorus: 3 mg/dL (ref 2.5–4.6)

## 2024-01-31 LAB — GLUCOSE, CAPILLARY
Glucose-Capillary: 142 mg/dL — ABNORMAL HIGH (ref 70–99)
Glucose-Capillary: 126 mg/dL — ABNORMAL HIGH (ref 70–99)
Glucose-Capillary: 115 mg/dL — ABNORMAL HIGH (ref 70–99)
Glucose-Capillary: 114 mg/dL — ABNORMAL HIGH (ref 70–99)
Glucose-Capillary: 145 mg/dL — ABNORMAL HIGH (ref 70–99)
Glucose-Capillary: 152 mg/dL — ABNORMAL HIGH (ref 70–99)
Glucose-Capillary: 184 mg/dL — ABNORMAL HIGH (ref 70–99)

## 2024-01-31 LAB — HEMOGLOBIN A1C
Hgb A1c MFr Bld: 6.1 % — ABNORMAL HIGH (ref 4.8–5.6)
Mean Plasma Glucose: 128.37 mg/dL

## 2024-01-31 LAB — PROTIME-INR
INR: 1 (ref 0.8–1.2)
Prothrombin Time: 13.1 s (ref 11.4–15.2)

## 2024-01-31 LAB — MAGNESIUM: Magnesium: 1.6 mg/dL — ABNORMAL LOW (ref 1.7–2.4)

## 2024-01-31 LAB — T4, FREE: Free T4: 0.3 ng/dL — ABNORMAL LOW (ref 0.61–1.12)

## 2024-01-31 LAB — MRSA NEXT GEN BY PCR, NASAL: MRSA by PCR Next Gen: NOT DETECTED

## 2024-01-31 MED ORDER — MAGNESIUM SULFATE 2 GM/50ML IV SOLN: 2.0000 g | Freq: Once | INTRAVENOUS | Status: DC

## 2024-01-31 MED ORDER — VALPROATE SODIUM 100 MG/ML IV SOLN
500.0000 mg | Freq: Two times a day (BID) | INTRAVENOUS | Status: DC
  Administered 2024-01-31 – 2024-02-01 (×3): 500 mg via INTRAVENOUS
  Filled 2024-01-31: qty 5
  Filled 2024-01-31: qty 500
  Filled 2024-01-31: qty 5
  Filled 2024-01-31: qty 500
  Filled 2024-01-31: qty 5

## 2024-01-31 MED ORDER — OSMOLITE 1.5 CAL PO LIQD
1000.0000 mL | ORAL | Status: DC
  Administered 2024-01-31 – 2024-02-02 (×3): 1000 mL
  Filled 2024-01-31 (×3): qty 1000

## 2024-01-31 MED ORDER — ORAL CARE MOUTH RINSE
15.0000 mL | OROMUCOSAL | Status: DC | PRN
  Administered 2024-02-01 – 2024-02-02 (×2): 15 mL via OROMUCOSAL

## 2024-01-31 MED ORDER — ATROPINE SULFATE 1 MG/10ML IJ SOSY
PREFILLED_SYRINGE | INTRAMUSCULAR | Status: AC
  Filled 2024-01-31: qty 10

## 2024-01-31 MED ORDER — ADULT MULTIVITAMIN W/MINERALS CH
1.0000 | ORAL_TABLET | Freq: Every day | ORAL | Status: DC
  Administered 2024-01-31 – 2024-02-04 (×5): 1
  Filled 2024-01-31 (×5): qty 1

## 2024-01-31 MED ORDER — ORAL CARE MOUTH RINSE
15.0000 mL | OROMUCOSAL | Status: DC
  Administered 2024-02-01 – 2024-02-23 (×77): 15 mL via OROMUCOSAL

## 2024-01-31 MED ORDER — FENTANYL CITRATE PF 50 MCG/ML IJ SOSY
25.0000 ug | PREFILLED_SYRINGE | Freq: Once | INTRAMUSCULAR | Status: AC
  Administered 2024-01-31: 25 ug via INTRAVENOUS
  Filled 2024-01-31: qty 1

## 2024-01-31 MED ORDER — HYDRALAZINE HCL 20 MG/ML IJ SOLN
10.0000 mg | INTRAMUSCULAR | Status: DC | PRN
  Administered 2024-02-01 – 2024-02-06 (×6): 10 mg via INTRAVENOUS
  Filled 2024-01-31 (×7): qty 1

## 2024-01-31 MED ORDER — POTASSIUM CHLORIDE 10 MEQ/100ML IV SOLN
10.0000 meq | INTRAVENOUS | Status: AC
  Administered 2024-01-31 (×4): 10 meq via INTRAVENOUS
  Filled 2024-01-31 (×4): qty 100

## 2024-01-31 MED ORDER — THIAMINE MONONITRATE 100 MG PO TABS
100.0000 mg | ORAL_TABLET | Freq: Every day | ORAL | Status: DC
  Administered 2024-01-31 – 2024-02-04 (×4): 100 mg
  Filled 2024-01-31 (×4): qty 1

## 2024-01-31 MED ORDER — LEVOTHYROXINE SODIUM 100 MCG/5ML IV SOLN
125.0000 ug | Freq: Every day | INTRAVENOUS | Status: DC
  Administered 2024-02-01: 125 ug via INTRAVENOUS
  Filled 2024-01-31 (×2): qty 10

## 2024-01-31 MED ORDER — ACETAMINOPHEN 160 MG/5ML PO SOLN
1000.0000 mg | Freq: Four times a day (QID) | ORAL | Status: AC | PRN
  Administered 2024-01-31 – 2024-02-02 (×3): 1000 mg
  Filled 2024-01-31 (×3): qty 40.6

## 2024-01-31 MED ORDER — OXYCODONE HCL 5 MG PO TABS
5.0000 mg | ORAL_TABLET | ORAL | Status: DC | PRN
  Administered 2024-01-31 – 2024-02-02 (×5): 5 mg
  Filled 2024-01-31 (×6): qty 1

## 2024-01-31 MED ORDER — ALPRAZOLAM 0.5 MG PO TABS
0.5000 mg | ORAL_TABLET | Freq: Two times a day (BID) | ORAL | Status: DC | PRN
  Administered 2024-01-31 – 2024-02-01 (×2): 0.5 mg
  Filled 2024-01-31 (×2): qty 1

## 2024-01-31 MED ORDER — SODIUM CHLORIDE 0.9 % IV SOLN: INTRAVENOUS | Status: AC | PRN

## 2024-01-31 MED ORDER — PROSOURCE TF20 ENFIT COMPATIBL EN LIQD
60.0000 mL | Freq: Two times a day (BID) | ENTERAL | Status: DC
  Administered 2024-01-31 – 2024-02-04 (×9): 60 mL
  Filled 2024-01-31 (×9): qty 60

## 2024-01-31 MED ORDER — CHLORHEXIDINE GLUCONATE CLOTH 2 % EX PADS
6.0000 | MEDICATED_PAD | Freq: Every day | CUTANEOUS | Status: DC
  Administered 2024-01-31 – 2024-02-20 (×23): 6 via TOPICAL

## 2024-01-31 MED ORDER — NICOTINE 14 MG/24HR TD PT24
14.0000 mg | MEDICATED_PATCH | Freq: Every day | TRANSDERMAL | Status: AC
  Administered 2024-01-31 – 2024-02-06 (×7): 14 mg via TRANSDERMAL
  Filled 2024-01-31 (×7): qty 1

## 2024-01-31 MED ORDER — PERFLUTREN LIPID MICROSPHERE
1.0000 mL | INTRAVENOUS | Status: AC | PRN
  Administered 2024-01-31: 3 mL via INTRAVENOUS

## 2024-01-31 MED ORDER — MAGNESIUM SULFATE 4 GM/100ML IV SOLN
4.0000 g | Freq: Once | INTRAVENOUS | Status: AC
  Administered 2024-01-31: 4 g via INTRAVENOUS
  Filled 2024-01-31: qty 100

## 2024-01-31 MED ORDER — ACETAMINOPHEN 325 MG PO TABS
650.0000 mg | ORAL_TABLET | Freq: Four times a day (QID) | ORAL | Status: DC | PRN
  Administered 2024-01-31: 650 mg
  Filled 2024-01-31: qty 2

## 2024-01-31 NOTE — Consult Note (Signed)
 HPI:     Patient is a 63 y.o. male who presented to ED via EMS after a fall. Was noted to be hypothermic and bradycardic in ED with AMS. Workup revealing acute R SDH.     Patient Active Problem List   Diagnosis Date Noted   Subdural hematoma (HCC) 01/30/2024   Toxic metabolic encephalopathy 01/24/2016   Pressure ulcer 01/23/2016   Non-insulin  dependent type 2 diabetes mellitus (HCC)    Altered mental status 01/22/2016   COPD (chronic obstructive pulmonary disease) (HCC) 01/22/2016   Thrombocytopenia (HCC) 01/22/2016   Acute encephalopathy 11/01/2015   Chronic daily headache 03/08/2015   Localization-related symptomatic 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, intractable, without status epilepticus (HCC) 03/07/2015   Memory loss 03/07/2015   Seizure disorder (HCC) 03/05/2014   Other and unspecified hyperlipidemia 03/05/2014   Essential hypertension, benign 03/05/2014   Diabetes (HCC) 03/05/2014   Depression 03/05/2014   Generalized anxiety disorder 03/05/2014   Hypothyroidism 03/05/2014   Poor dentition 03/05/2014   Past Medical History:  Diagnosis Date   Anemia    Depression    Diabetes mellitus without complication (HCC)    Emphysema of lung (HCC)    GERD (gastroesophageal reflux disease)    Hiatal hernia    Hyperlipidemia    Hypertension    Memory loss    Seizures (HCC)    Thyroid  disease     Past Surgical History:  Procedure Laterality Date   APPENDECTOMY     DENTAL SURGERY     EYE SURGERY     TONSILLECTOMY AND ADENOIDECTOMY      Medications Prior to Admission  Medication Sig Dispense Refill Last Dose/Taking   albuterol  (PROVENTIL  HFA;VENTOLIN  HFA) 108 (90 BASE) MCG/ACT inhaler Inhale 2 puffs into the lungs every 4 (four) hours as needed for wheezing or shortness of breath.   Unknown   alprazolam  (XANAX ) 2 MG tablet Take 0.5 tablets (1 mg total) by mouth 2 (two) times daily. (Patient taking differently: Take 2-4 mg by mouth See admin  instructions. Take 4 mg by mouth at bedtime and an additional 2 mg once a day as needed for anxiety) 30 tablet 0 01/29/2024 Bedtime   citalopram  (CELEXA ) 20 MG tablet Take 20 mg by mouth daily.    01/30/2024 Morning   divalproex  (DEPAKOTE  ER) 500 MG 24 hr tablet Take 500 mg by mouth in the morning and at bedtime.   01/30/2024 Morning   levothyroxine  (SYNTHROID , LEVOTHROID) 150 MCG tablet Take 150 mcg by mouth daily before breakfast.    Unknown   metFORMIN (GLUCOPHAGE-XR) 500 MG 24 hr tablet Take 500 mg by mouth in the morning and at bedtime.   01/30/2024 Morning   traZODone  (DESYREL ) 100 MG tablet Take 200 mg by mouth at bedtime.   01/29/2024 Bedtime   TRELEGY ELLIPTA 100-62.5-25 MCG/ACT AEPB Inhale 1 puff into the lungs daily.   01/30/2024   Ascorbic Acid (VITAMIN C PO) Take 1 tablet by mouth daily.   Unknown   Calcium  Carb-Cholecalciferol (CALCIUM  HIGH POTENCY/VITAMIN D) 600-5 MG-MCG TABS Take 1 tablet by mouth daily.   Unknown   omeprazole (PRILOSEC) 20 MG capsule Take 20 mg by mouth daily.   Unknown   Allergies  Allergen Reactions   Levetiracetam  Other (See Comments)    Severe anxiety, agitation, hyperactivity, very negative disposition   Tegretol [Carbamazepine] Anxiety and Other (See Comments)    Increased seizures/ Induces seizures   Penicillins Other (See Comments)    Reaction not noted  Topiramate Other (See Comments)    Reaction not noted    Social History   Tobacco Use   Smoking status: Every Day    Types: E-cigarettes, Cigarettes   Smokeless tobacco: Never   Tobacco comments:    11/01/2015 "did smoke 3 ppd; he's been vaping for 4 years; no nicotine for the past year; smoked a cigarette 2 days ago"  Substance Use Topics   Alcohol use: No    Alcohol/week: 0.0 standard drinks of alcohol    Family History  Problem Relation Age of Onset   Stroke Mother    Heart disease Father    Thyroid  disease Father    Thyroid  disease Brother      Objective:   Patient Vitals for the past 8  hrs:  BP Temp Temp src Pulse Resp SpO2 Weight  01/31/24 0800 (!) 142/71 98.1 F (36.7 C) Axillary (!) 49 12 96 % --  01/31/24 0700 (!) 143/111 -- -- (!) 58 13 94 % --  01/31/24 0600 (!) 153/91 -- -- (!) 59 11 95 % --  01/31/24 0500 (!) 146/75 -- -- (!) 57 12 96 % 91.3 kg  01/31/24 0406 (!) 141/74 -- -- -- -- -- --  01/31/24 0400 (!) 153/124 98.6 F (37 C) Axillary 62 11 94 % --  01/31/24 0317 -- -- -- -- -- 95 % --  01/31/24 0300 (!) 159/79 -- -- 64 11 91 % --  01/31/24 0245 -- 98.1 F (36.7 C) Oral -- -- -- --  01/31/24 0200 (!) 162/77 -- -- 60 11 94 % --  01/31/24 0130 (!) 159/77 -- -- (!) 54 12 96 % --   I/O last 3 completed shifts: In: 1099.6 [IV Piggyback:1099.6] Out: 830 [Urine:830] Total I/O In: -  Out: 350 [Urine:350]   Awake, alert, oriented Speech fluent, appropriate CN grossly intact MAEs Diffuse bruising to L side of face/body in various stages of healing   Assessment:   Principal Problem:   Subdural hematoma (HCC)  This is a 63 year old with an acute R SDH/SAH w/o significant MLS/mass effect, neurologically stable.   Plan:   -Repeat CTH pending -SBP <160 -Neuro checks -Keppra  -Supportive care  Vitaly Wanat CAYLIN Mersadie Kavanaugh, PA-C

## 2024-01-31 NOTE — Progress Notes (Signed)
 eLink Physician-Brief Progress Note Patient Name: [name] DOB: 03/04/1961 MRN: [id number]   Date of Service  01/31/2024  HPI/Events of Note  Patient smokes 1 ppd of cigarettes and is yelling out asking for cigarettes.  eICU Interventions  Nicotine patch ordered.        Eyonna Sandstrom U Farrin Shadle 01/31/2024, 4:29 AM

## 2024-01-31 NOTE — Progress Notes (Signed)
 Echocardiogram 2D Echocardiogram has been performed.  [name] RDCS 01/31/2024, 11:39 AM

## 2024-01-31 NOTE — Progress Notes (Signed)
 eLink Physician-Brief Progress Note Patient Name: [name] DOB: 22-Feb-1961 MRN: [id number]   Date of Service  01/31/2024  HPI/Events of Note  Patient complaining of headache, Tylenol  has not controlled it, per bedside RN his baseline neurological exam is otherwise unchanged.  eICU Interventions  PRN Oxycodone  added.        Javarie Crisp U Raveena Hebdon 01/31/2024, 9:44 PM

## 2024-01-31 NOTE — H&P (Signed)
 NAME:  [name], MRN:  [id number], DOB:  1961/04/24, LOS: 1 ADMISSION DATE:  01/30/2024, CONSULTATION DATE:  01/31/2024  REFERRING MD:  [facility], EDP, CHIEF COMPLAINT:  fall, hypothermia   History of Present Illness:  63 year old man who was brought in by EMS from home after a fall.  Apparently wife called, patient is currently ANO x 1 and history obtained after chart review, unable to reach wife.  His heart rate initially was in the 80s, A/O x 1 per EMS, bradycardic to 40s in the ED , noted to be hypothermic 94.  Bruises of multiple ages left hip left eye and left shoulder Labs significant for no leukocytosis, hemoglobin 9.3, mild thrombocytopenia 1 27K, mild hypokalemia 3.3, hypomagnesemia 1.5, normal LFTs.  UDS positive for benzodiazepines, tox screen negative, ammonia 19, TSH 48.  EKG showed sinus bradycardia He was given 1 g of magnesium , 100 mg of IV Solu-Cortef  and 200 mics of levothyroxine  IV Head CT showed 9 mm right subdural hematoma with 3 mm midline shift and small volume acute SAH, cervical spine was negative. PCCM consulted for admission  Pertinent  Medical History  Seizure disorder COPD   Significant Hospital Events: Including procedures, antibiotic start and stop dates in addition to other pertinent events   5/8 Admitted  Interim History / Subjective:  No overnight issues, patient remained confused and restless, trying to get out of bed Repeat head CT is pending   Patient Lines/Drains/Airways Status     Active Line/Drains/Airways     Name Placement date Placement time Site Days   Peripheral IV 01/30/24 18 G Left Antecubital 01/30/24  1945  Antecubital  1   Peripheral IV 01/30/24 20 G Anterior;Right Forearm 01/30/24  2225  Forearm  1   External Urinary Catheter 01/31/24  0100  --  less than 1   Pressure Ulcer Stage II -  Partial thickness loss of dermis presenting as a shallow open ulcer with a red, pink wound bed without slough. --  --  -- --   Pressure Injury  01/31/24 Coccyx Mid Deep Tissue Pressure Injury - Purple or maroon localized area of discolored intact skin or blood-filled blister due to damage of underlying soft tissue from pressure and/or shear. 01/31/24  0100  -- less than 1         Objective    Physical exam: General: Acute on chronically ill-appearing male, lying on the bed.  Agitated and restless HEENT: Bruise mark noted around left eye.  moist mucus membranes Neuro: Opens eyes to vocal stimuli, intermittently following commands, antigravity in all 4 extremities Chest: Coarse breath sounds, no wheezes or rhonchi Heart: Regular rate and rhythm, no murmurs or gallops Abdomen: Soft, nontender, nondistended, bowel sounds present Skin: Multiple bruise mark noted all around the body with a large 1 around left hip area  Labs and images reviewed  Blood pressure 136/61, pulse (!) 49, temperature 98.1 F (36.7 C), temperature source Axillary, resp. rate 16, height 5\' 6"  (1.676 m), weight 91.3 kg, SpO2 97%.        Intake/Output Summary (Last 24 hours) at 01/31/2024 0956 Last data filed at 01/31/2024 0900 Gross per 24 hour  Intake 1099.57 ml  Output 1180 ml  Net -80.43 ml   Filed Weights   01/30/24 2002 01/31/24 0500  Weight: 93.7 kg 91.3 kg    Resolved Hospital Problem list     Assessment & Plan:  Acute right subdural hematoma with 3 mm midline shift Acute traumatic subarachnoid hemorrhage status post  fall Acute encephalopathy in the setting of right subdural hematoma 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, noncompliant with meds Bradycardia/hypothermia due to hypothyroidism Prediabetes Anxiety disorder on chronic benzodiazepine Tobacco dependence COPD, not in exacerbation Seizure disorder  Continue neuro watch Neurosurgery is following Repeat head CT is pending Continue delirium precautions Continue IV Synthroid , per patient's wife he has not taking his meds in last 2 months because he ran out His heart rate is in  60s, his temperature has normalized Received hydrocortisone  overnight Continue sliding scale insulin  Hemoglobin A1c 6.9 Hold benzodiazepines Holding Celexa  and trazodone  for now Continue nicotine patch to continue nicotine withdrawal Continue Brovana  and Yupelri  Restarted back on Depakote    Best Practice (right click and "Reselect all SmartList Selections" daily)   Diet/type: NPO if failed swallow evaluation, place cortrak DVT prophylaxis SCD Pressure ulcer(s): N/A GI prophylaxis: PPI Lines: N/A Foley:  N/A Code Status:  full code Last date of multidisciplinary goals of care discussion: 5/9: Patient's wife was updated over the phone, decision was to continue full scope of care  Labs   CBC: Recent Labs  Lab 01/30/24 1952 01/31/24 0528  WBC 7.8 9.1  HGB 9.3* 10.7*  HCT 27.2* 30.3*  MCV 91.9 86.8  PLT 127* 147*    Basic Metabolic Panel: Recent Labs  Lab 01/30/24 1952 01/30/24 2032 01/31/24 0528  NA 132*  --  127*  K 3.3*  --  3.3*  CL 96*  --  88*  CO2 26  --  26  GLUCOSE 134*  --  115*  BUN 20  --  14  CREATININE 1.03  --  0.88  CALCIUM  8.5*  --  9.0  MG  --  1.5* 1.6*  PHOS  --   --  3.0   GFR: Estimated Creatinine Clearance: 89.7 mL/min (by C-G formula based on SCr of 0.88 mg/dL). Recent Labs  Lab 01/30/24 1952 01/30/24 2020 01/31/24 0528  WBC 7.8  --  9.1  LATICACIDVEN  --  2.1*  --     Liver Function Tests: Recent Labs  Lab 01/30/24 1952 01/31/24 0528  AST 22 24  ALT 11 14  ALKPHOS 34* 44  BILITOT 1.2 1.3*  PROT 6.5 8.0  ALBUMIN 3.4* 4.0   No results for input(s): "LIPASE", "AMYLASE" in the last 168 hours. Recent Labs  Lab 01/30/24 2032  AMMONIA 19    ABG    Component Value Date/Time   PHART 7.363 11/01/2015 0740   PCO2ART 47.7 (H) 11/01/2015 0740   PO2ART 82.0 11/01/2015 0740   HCO3 27.7 (H) 01/22/2016 2138   TCO2 29 01/22/2016 2138   O2SAT 79.0 01/22/2016 2138     Coagulation Profile: Recent Labs  Lab 01/30/24 2032  01/31/24 0528  INR 1.1 1.0    Cardiac Enzymes: No results for input(s): "CKTOTAL", "CKMB", "CKMBINDEX", "TROPONINI" in the last 168 hours.  HbA1C: Hgb A1c MFr Bld  Date/Time Value Ref Range Status  01/31/2024 05:28 AM 6.1 (H) 4.8 - 5.6 % Final    Comment:    (NOTE) Pre diabetes:          5.7%-6.4%  Diabetes:              >6.4%  Glycemic control for   <7.0% adults with diabetes     CBG: Recent Labs  Lab 01/30/24 2005 01/31/24 0122 01/31/24 0339 01/31/24 0758  GLUCAP 143* 142* 126* 115*    The patient is critically ill due to acute right subdural hematoma/Traumatic SAH. critical care was necessary to  treat or prevent imminent or life-threatening deterioration.  Critical care was time spent personally by me on the following activities: development of treatment plan with patient and/or surrogate as well as nursing, discussions with consultants, evaluation of patient's response to treatment, examination of patient, obtaining history from patient or surrogate, ordering and performing treatments and interventions, ordering and review of laboratory studies, ordering and review of radiographic studies, pulse oximetry, re-evaluation of patient's condition and participation in multidisciplinary rounds.   During this encounter critical care time was devoted to patient care services described in this note for 42 minutes.    Trevor Fudge, MD [facility] See Amion for pager If no response to pager, please call [phone] until 7pm After 7pm, Please call E-link [phone]

## 2024-01-31 NOTE — Progress Notes (Signed)
 RN called E-link to notify that patient's SBP is above 160, and there are no PRN blood pressure medications. Awaiting any new orders.

## 2024-01-31 NOTE — Progress Notes (Signed)
 RN notified E-link that patient is complaining of a severe headache, with no PRNs for severe pain, and that PRN tylenol  is not available at the moment.

## 2024-01-31 NOTE — Progress Notes (Signed)
 Initial Nutrition Assessment  DOCUMENTATION CODES:   Not applicable  INTERVENTION:   Initiate tube feeding via Cortrak tube: Osmolite 1.5 at 25 ml/h and increase by 10 ml every 4 hours to goal rate of 55 ml/hr (1320 ml per day)  Prosource TF20 60 ml BID  Provides 2140 kcal, 124 gm protein, 1003 ml free water daily  Monitor magnesium  and phosphorus every 12 hours x 4 occurrences, MD to replete as needed, as pt is at risk for refeeding syndrome given low K and mag.  100 mg thiamine daily  MVI with minerals daily    NUTRITION DIAGNOSIS:   Inadequate oral intake related to lethargy/confusion as evidenced by NPO status.  GOAL:   Patient will meet greater than or equal to 90% of their needs  MONITOR:   TF tolerance, Diet advancement  REASON FOR ASSESSMENT:   Consult Enteral/tube feeding initiation and management  ASSESSMENT:   Pt with PMH of COPD and seizure disorder admitted after falling at home dx with acute R SDH with 3 mm midline shift and acute traumatic SAH.   Pt discussed during ICU rounds and with RN and MD.  Eldora Greet with pt, no family present. Noted pt edentulous, per pt he has no dentures but does not have trouble chewing. Unable to answer many questions, appears confused.  Pt with 3.6% weight loss over 5 months per chart review.    5/9 - s/p cortrak placement; tip in gastric fundus   Medications reviewed and include: novolog  SSI every 4 hours, synthroid , protonix  4 g mag sulfate x 1 10 mEq KCl x 4   Labs reviewed:  Na 127 K 3.3 Phos 3.0 Mag 1.6 A1C 6.1 TSH 48.0 CBG's: 114-142   NUTRITION - FOCUSED PHYSICAL EXAM:  Flowsheet Row Most Recent Value  Orbital Region No depletion  Upper Arm Region No depletion  Thoracic and Lumbar Region No depletion  Buccal Region No depletion  Temple Region No depletion  Clavicle Bone Region No depletion  Clavicle and Acromion Bone Region Mild depletion  Scapular Bone Region Unable to assess  Dorsal Hand No  depletion  Patellar Region No depletion  Anterior Thigh Region No depletion  Posterior Calf Region No depletion  Edema (RD Assessment) None  Hair Reviewed  Eyes Reviewed  Mouth Reviewed  [edentulous]  Skin Reviewed  Nails Reviewed       Diet Order:   Diet Order             Diet NPO time specified  Diet effective now                   EDUCATION NEEDS:   Not appropriate for education at this time  Skin:  Skin Assessment: Skin Integrity Issues: Skin Integrity Issues:: DTI, Stage II DTI: coccyx Stage II: sacrum  Last BM:  unknown  Height:   Ht Readings from Last 1 Encounters:  01/30/24 5\' 6"  (1.676 m)    Weight:   Wt Readings from Last 1 Encounters:  01/31/24 91.3 kg   BMI:  Body mass index is 32.49 kg/m.  Estimated Nutritional Needs:   Kcal:  2000-2300  Protein:  105-125 grams  Fluid:  > 2 L/day  Randine Butcher., RD, LDN, CNSC See AMiON for contact information

## 2024-01-31 NOTE — Progress Notes (Signed)
 Remains restless/ agitated and c/o of left side/ arm pain. S/p cortrak placement On home med review, pt appears on high dose xanax  at home (2mg  am then 4mg  at bedtime)  P:  - tylenol  prn tube for pain, lfts ok - restart prn BID low dose xanax  to prevent benzo withdrawal      Early Glisson, MSN, AG-ACNP-BC [facility] 01/31/2024, 2:58 PM  See Amion for pager If no response to pager , please call [phone] until 7pm After 7:00 pm call Elink  [phone]

## 2024-01-31 NOTE — Procedures (Addendum)
 Cortrak  Tube Type:  Cortrak - 43 inches Tube Location:  Left nare Initial Placement:  Stomach Secured by: Bridle Technique Used to Measure Tube Placement:  Marking at nare/corner of mouth Cortrak Secured At:  60 cm   Cortrak Tube Team Note:  Consult received to place a Cortrak feeding tube.   X-ray is required. RN may begin using tube once confirmed in placed.   If the tube becomes dislodged please keep the tube and contact the Cortrak team at [email or web address] for replacement.  If after hours and replacement cannot be delayed, place a NG tube and confirm placement with an abdominal x-ray.    Torrance Freestone MS, RD, LDN If unable to be reached, please send secure chat to "RD inpatient" available from 8:00a-4:00p daily

## 2024-01-31 NOTE — Progress Notes (Signed)
 eLink Physician-Brief Progress Note Patient Name: [name] DOB: 09-Dec-1960 MRN: [id number]   Date of Service  01/31/2024  HPI/Events of Note  Patient admitted with altered mental status secondary to severe medication non-compliance induced hypothyroidism, he has also had repeated falls with SDH and SAH. TSH was 48. He has received iv synthroid  + solucortef.  eICU Interventions  New Patient Evaluation.        [name] U Ashraf Mesta 01/31/2024, 1:43 AM

## 2024-01-31 NOTE — Progress Notes (Signed)
 RN notified E-link that patient was yelling out for a cigarette. Awaiting any new orders.

## 2024-02-01 ENCOUNTER — Inpatient Hospital Stay (HOSPITAL_COMMUNITY): Payer: Medicare (Managed Care)

## 2024-02-01 DIAGNOSIS — S066X0A Traumatic subarachnoid hemorrhage without loss of consciousness, initial encounter: Secondary | ICD-10-CM

## 2024-02-01 DIAGNOSIS — R7303 Prediabetes: Secondary | ICD-10-CM | POA: Diagnosis not present

## 2024-02-01 DIAGNOSIS — E039 Hypothyroidism, unspecified: Secondary | ICD-10-CM | POA: Diagnosis not present

## 2024-02-01 DIAGNOSIS — F17210 Nicotine dependence, cigarettes, uncomplicated: Secondary | ICD-10-CM

## 2024-02-01 DIAGNOSIS — S065XAA Traumatic subdural hemorrhage with loss of consciousness status unknown, initial encounter: Secondary | ICD-10-CM | POA: Diagnosis not present

## 2024-02-01 DIAGNOSIS — R001 Bradycardia, unspecified: Secondary | ICD-10-CM | POA: Diagnosis not present

## 2024-02-01 LAB — MAGNESIUM: Magnesium: 2.2 mg/dL (ref 1.7–2.4)

## 2024-02-01 LAB — BASIC METABOLIC PANEL WITH GFR
Anion gap: 13 (ref 5–15)
BUN: 22 mg/dL (ref 8–23)
CO2: 24 mmol/L (ref 22–32)
Calcium: 9 mg/dL (ref 8.9–10.3)
Chloride: 89 mmol/L — ABNORMAL LOW (ref 98–111)
Creatinine, Ser: 0.74 mg/dL (ref 0.61–1.24)
GFR, Estimated: >60 mL/min (ref >60)
Glucose, Bld: 189 mg/dL — ABNORMAL HIGH (ref 70–99)
Potassium: 3 mmol/L — ABNORMAL LOW (ref 3.5–5.1)
Sodium: 126 mmol/L — ABNORMAL LOW (ref 135–145)

## 2024-02-01 LAB — GLUCOSE, CAPILLARY
Glucose-Capillary: 199 mg/dL — ABNORMAL HIGH (ref 70–99)
Glucose-Capillary: 194 mg/dL — ABNORMAL HIGH (ref 70–99)
Glucose-Capillary: 230 mg/dL — ABNORMAL HIGH (ref 70–99)
Glucose-Capillary: 184 mg/dL — ABNORMAL HIGH (ref 70–99)
Glucose-Capillary: 225 mg/dL — ABNORMAL HIGH (ref 70–99)
Glucose-Capillary: 135 mg/dL — ABNORMAL HIGH (ref 70–99)

## 2024-02-01 LAB — CBC
HCT: 27.8 % — ABNORMAL LOW (ref 39.0–52.0)
Hemoglobin: 10 g/dL — ABNORMAL LOW (ref 13.0–17.0)
MCH: 30.9 pg (ref 26.0–34.0)
MCHC: 36 g/dL (ref 30.0–36.0)
MCV: 85.8 fL (ref 80.0–100.0)
Platelets: 147 10*3/uL — ABNORMAL LOW (ref 150–400)
RBC: 3.24 MIL/uL — ABNORMAL LOW (ref 4.22–5.81)
RDW: 14.3 % (ref 11.5–15.5)
WBC: 13.2 10*3/uL — ABNORMAL HIGH (ref 4.0–10.5)
nRBC: 0.2 % (ref 0.0–0.2)

## 2024-02-01 LAB — LEVETIRACETAM LEVEL: Levetiracetam Lvl: <2 ug/mL — ABNORMAL LOW (ref 10.0–40.0)

## 2024-02-01 LAB — T3, FREE: T3, Free: <0.3 pg/mL — ABNORMAL LOW (ref 2.0–4.4)

## 2024-02-01 LAB — SODIUM
Sodium: 125 mmol/L — ABNORMAL LOW (ref 135–145)
Sodium: 126 mmol/L — ABNORMAL LOW (ref 135–145)
Sodium: 123 mmol/L — ABNORMAL LOW (ref 135–145)

## 2024-02-01 LAB — PHOSPHORUS: Phosphorus: 2.2 mg/dL — ABNORMAL LOW (ref 2.5–4.6)

## 2024-02-01 MED ORDER — VALPROIC ACID 250 MG/5ML PO SOLN
500.0000 mg | Freq: Two times a day (BID) | ORAL | Status: DC
  Administered 2024-02-01 – 2024-02-04 (×8): 500 mg
  Filled 2024-02-01 (×9): qty 10

## 2024-02-01 MED ORDER — LABETALOL HCL 5 MG/ML IV SOLN
10.0000 mg | INTRAVENOUS | Status: DC | PRN
  Administered 2024-02-01 – 2024-02-20 (×2): 10 mg via INTRAVENOUS
  Filled 2024-02-01: qty 4

## 2024-02-01 MED ORDER — SODIUM CHLORIDE 0.9 % IV SOLN: INTRAVENOUS | Status: DC

## 2024-02-01 MED ORDER — POTASSIUM CHLORIDE 20 MEQ PO PACK
40.0000 meq | PACK | Freq: Once | ORAL | Status: AC
  Administered 2024-02-01: 40 meq
  Filled 2024-02-01: qty 2

## 2024-02-01 MED ORDER — POTASSIUM PHOSPHATES 15 MMOLE/5ML IV SOLN
30.0000 mmol | Freq: Once | INTRAVENOUS | Status: AC
  Administered 2024-02-01: 30 mmol via INTRAVENOUS
  Filled 2024-02-01: qty 10

## 2024-02-01 MED ORDER — ALPRAZOLAM 0.5 MG PO TABS: 1.0000 mg | ORAL_TABLET | Freq: Once | ORAL | Status: DC

## 2024-02-01 MED ORDER — ALPRAZOLAM 0.5 MG PO TABS
1.0000 mg | ORAL_TABLET | Freq: Two times a day (BID) | ORAL | Status: DC
  Administered 2024-02-01 – 2024-02-03 (×6): 1 mg
  Filled 2024-02-01 (×6): qty 2

## 2024-02-01 NOTE — Progress Notes (Signed)
 E-link notified that patient's SBP is above goal of 160. PRN hydralazine is every 4 hours and not yet able to be given. No other PRNs or drips available.

## 2024-02-01 NOTE — Evaluation (Signed)
 Occupational Therapy Evaluation Patient Details Name: [name] MRN: [id number] DOB: 07-22-1961 Today's Date: 02/01/2024   History of Present Illness   Pt is a 63 y.o. male presenting 5/8 after a fall at home. UDS positive for benzodiazepines. EKG with sinus bradycardia. Head CT 5/8 showed 9 mm right subdural hematoma with 3 mm midline shift and small volume acute SAH. PMH significant for seizures and COPD.     Clinical Impressions PTA, pt lived with wife per chart; at time of eval, pt questionable historian as he inconsistently provides answers to orientation questions, and reporting he is at home. Pt currently with problem above and deficits mentioned below with cognition affecting all mobility and ADL needing significant cues for problem solving, sequencing, and initiation during ADL. Pt endorsing several recent falls with bruising of varying ages several locations (hip, eye, side). Patient will benefit from continued inpatient follow up therapy, <3 hours/day      If plan is discharge home, recommend the following:   Two people to help with walking and/or transfers;Two people to help with bathing/dressing/bathroom;Assist for transportation;Help with stairs or ramp for entrance;Direct supervision/assist for medications management;Direct supervision/assist for financial management;Assistance with cooking/housework;Assistance with feeding     Functional Status Assessment   Patient has had a recent decline in their functional status and demonstrates the ability to make significant improvements in function in a reasonable and predictable amount of time.     Equipment Recommendations   Other (comment) (defer)     Recommendations for Other Services         Precautions/Restrictions   Precautions Precautions: Fall;Other (comment) (posey, bil mitts, R wrist restraint) Recall of Precautions/Restrictions: Impaired Restrictions Weight Bearing Restrictions Per Provider Order:  No     Mobility Bed Mobility Overal bed mobility: Needs Assistance Bed Mobility: Supine to Sit, Sit to Supine     Supine to sit: Mod assist, +2 for physical assistance, +2 for safety/equipment Sit to supine: Mod assist, +2 for physical assistance, +2 for safety/equipment   General bed mobility comments: Mod+2 for all portions predominantly due to cognition and need for dense cues    Transfers Overall transfer level: Needs assistance Equipment used: Rolling walker (2 wheels) Transfers: Sit to/from Stand Sit to Stand: Mod assist, +2 safety/equipment, +2 physical assistance           General transfer comment: dense cues for hand placement, sequence, initiation      Balance Overall balance assessment: Needs assistance Sitting-balance support: Bilateral upper extremity supported, Single extremity supported, Feet supported Sitting balance-Leahy Scale: Poor Sitting balance - Comments: CGA for safety at all times EOB   Standing balance support: Bilateral upper extremity supported, During functional activity, Reliant on assistive device for balance Standing balance-Leahy Scale: Poor                             ADL either performed or assessed with clinical judgement   ADL Overall ADL's : Needs assistance/impaired Eating/Feeding: NPO   Grooming: Contact guard assist;Wash/dry face;Sitting   Upper Body Bathing: Minimal assistance;Sitting   Lower Body Bathing: Maximal assistance;Sit to/from stand   Upper Body Dressing : Moderate assistance;Sitting   Lower Body Dressing: Maximal assistance;Sitting/lateral leans;Sit to/from stand   Toilet Transfer: Moderate assistance;+2 for safety/equipment;+2 for physical assistance   Toileting- Clothing Manipulation and Hygiene: Total assistance;Sit to/from stand       Functional mobility during ADLs: Moderate assistance;+2 for physical assistance;+2 for safety/equipment;Rolling walker (2 wheels) General ADL  Comments: STS  only this session     Vision Ability to See in Adequate Light: 3 Highly impaired Patient Visual Report: No change from baseline Additional Comments: very poor vision even with his corrective lenses at baseline     Perception Perception: Impaired (visually impaired)       Praxis         Pertinent Vitals/Pain Pain Assessment Pain Assessment: Faces Faces Pain Scale: Hurts little more Pain Location: generalized, back, L hip Pain Descriptors / Indicators: Aching Pain Intervention(s): Limited activity within patient's tolerance, Monitored during session     Extremity/Trunk Assessment Upper Extremity Assessment Upper Extremity Assessment: Generalized weakness (3/5 bil grip strength)   Lower Extremity Assessment Lower Extremity Assessment: Defer to PT evaluation   Cervical / Trunk Assessment Cervical / Trunk Assessment: Kyphotic   Communication Communication Communication: No apparent difficulties   Cognition Arousal: Alert Behavior During Therapy: Impulsive, Lability Cognition: No family/caregiver present to determine baseline, Cognition impaired   Orientation impairments: Place, Time, Situation Awareness: Intellectual awareness impaired, Online awareness impaired Memory impairment (select all impairments): Short-term memory, Working [employer] fast streamer, Engineer, structural memory Attention impairment (select first level of impairment): Focused attention Executive functioning impairment (select all impairments): Initiation, Organization, Sequencing, Reasoning, Problem solving OT - Cognition Comments: follows basic one step commands with mod-max multipmodal cues and frequent redirection, internally distracted                 Following commands: Impaired Following commands impaired: Follows one step commands inconsistently     Cueing  General Comments   Cueing Techniques: Verbal cues  VSS during session, however, with return to supine HR with rapid drop from 100 back to  50s; per RN pt with frequent brady to 30s today   Exercises     Shoulder Instructions      Home Living Family/patient expects to be discharged to:: Private residence                                        Prior Functioning/Environment               Mobility Comments: reports RW use ADLs Comments: pt unable to report at time of eval as he is not oriented and reports we are in his home    OT Problem List: Decreased strength;Decreased activity tolerance;Impaired balance (sitting and/or standing);Impaired vision/perception;Decreased cognition;Decreased safety awareness;Decreased knowledge of use of DME or AE   OT Treatment/Interventions: Self-care/ADL training;Therapeutic exercise;DME and/or AE instruction;Balance training;Patient/family education;Therapeutic activities;Cognitive remediation/compensation      OT Goals(Current goals can be found in the care plan section)   Acute Rehab OT Goals OT Goal Formulation: Patient unable to participate in goal setting Time For Goal Achievement: 02/15/24 Potential to Achieve Goals: Fair   OT Frequency:  Min 1X/week    Co-evaluation              AM-PAC OT "6 Clicks" Daily Activity     Outcome Measure Help from another person eating meals?: Total (NPO) Help from another person taking care of personal grooming?: A Lot Help from another person toileting, which includes using toliet, bedpan, or urinal?: A Lot Help from another person bathing (including washing, rinsing, drying)?: A Lot Help from another person to put on and taking off regular upper body clothing?: A Lot Help from another person to put on and taking off regular lower body clothing?: A Lot 6 Click  Score: 11   End of Session Equipment Utilized During Treatment: Gait belt;Rolling walker (2 wheels) Nurse Communication: Mobility status  Activity Tolerance: Patient tolerated treatment well Patient left: in bed;with call bell/phone within reach;with  bed alarm set;with restraints reapplied  OT Visit Diagnosis: Unsteadiness on feet (R26.81);Muscle weakness (generalized) (M62.81);Other symptoms and signs involving cognitive function;Low vision, both eyes (H54.2)                Time: 1610-9604 OT Time Calculation (min): 25 min Charges:  OT General Charges $OT Visit: 1 Visit OT Evaluation $OT Eval Moderate Complexity: 1 Mod  Karilyn Ouch, OTR/L [facility] Acute Rehabilitation Office: [phone]   Emery Hans 02/01/2024, 5:45 PM

## 2024-02-01 NOTE — Progress Notes (Addendum)
 eLink Physician-Brief Progress Note Patient Name: [name] DOB: 09/29/1960 MRN: [id number]   Date of Service  02/01/2024  HPI/Events of Note  63 year old man who fell at home. History of mechanical falls due to poor vision. Was found to have right posterior SDH and SAH.  SBP goal < 160 using Hydralazine 10 mg Q4 PRN. Current BP 163/88 (107) despite Hydralazine   eICU Interventions  Add 2nd line labetalol   2201 -duotube got displaced and dislodged.  KUB shows a sitting in the esophagus.  Will discontinue and replace an NG tube  2340 -persistent delirium, extend restraints to bilateral wrist  Intervention Category Intermediate Interventions: Hypertension - evaluation and management  Beth Goodlin 02/01/2024, 8:32 PM

## 2024-02-01 NOTE — Evaluation (Signed)
 Clinical/Bedside Swallow Evaluation Patient Details  Name: [name] MRN: [id number] Date of Birth: 17-Mar-1961  Today's Date: 02/01/2024 Time: SLP Start Time (ACUTE ONLY): 1135 SLP Stop Time (ACUTE ONLY): 1148 SLP Time Calculation (min) (ACUTE ONLY): 13 min  Past Medical History:  Past Medical History:  Diagnosis Date   Anemia    Depression    Diabetes mellitus without complication (HCC)    Emphysema of lung (HCC)    GERD (gastroesophageal reflux disease)    Hiatal hernia    Hyperlipidemia    Hypertension    Memory loss    Seizures (HCC)    Thyroid  disease    Past Surgical History:  Past Surgical History:  Procedure Laterality Date   APPENDECTOMY     DENTAL SURGERY     EYE SURGERY     TONSILLECTOMY AND ADENOIDECTOMY     HPI:  Pt is a 63 y.o. male who was brought to ED by EMS from home after a fall. CT head (01/30/23) revealed "Slightly increased size of right larger than left subdural  hematomas. Small volume subarachnoid hemorrhage with mildly increased  hemorrhage in the left lateral ventricle. Failed yale 02/01/24. PMH: Seizure disorder, COPD.    Assessment / Plan / Recommendation  Clinical Impression  Pt asleep upon SLP arrival, but awakened with verbal cues and upright positioning. Oral mechanism examination appeared [facility], possible generalized oral weakness. Note he is edentulous, no dentures available. Across all POs, oral manipulation and awareness was variable and this is likely related to increasing fatigue. Trials were terminated after lethargy was to a level that pt wasn't awakening to clinician cues consistently.  Ice chips and sips of thin by cup were followed by x2 coughing, however no s/sx present with sips via straw. Puree by tsp was held in oral cavity for a brief period, but then swallowed and cleared completely. Oral holding present with ice chips too and clinician did have to verbally cue for swallow. Pt overall had inconsistent perfomance with PO trials during  swallow eval, which raises concern for safety and aspiration risk. Recommend remain NPO with alternative means of nutrition via cortrak. Lethargy and poor attention appears to be impacting swallow function primarily at this point. Will f/u for PO readiness.  SLP Visit Diagnosis: Dysphagia, unspecified (R13.10)    Aspiration Risk       Diet Recommendation NPO    Medication Administration: Via alternative means    Other  Recommendations Oral Care Recommendations: Oral care QID    Recommendations for follow up therapy are one component of a multi-disciplinary discharge planning process, led by the attending physician.  Recommendations may be updated based on patient status, additional functional criteria and insurance authorization.  Follow up Recommendations  (TBD)      Assistance Recommended at Discharge    Functional Status Assessment Patient has had a recent decline in their functional status and demonstrates the ability to make significant improvements in function in a reasonable and predictable amount of time.  Frequency and Duration min 2x/week  2 weeks       Prognosis Prognosis for improved oropharyngeal function: Fair Barriers to Reach Goals: Time post onset      Swallow Study   General Date of Onset: 01/31/24 HPI: Pt is a 63 y.o. male who was brought to ED by EMS from home after a fall. CT head (01/30/23) revealed "Slightly increased size of right larger than left subdural  hematomas. Small volume subarachnoid hemorrhage with mildly increased  hemorrhage in the left  lateral ventricle. Failed yale 02/01/24. PMH: Seizure disorder, COPD. Type of Study: Bedside Swallow Evaluation Previous Swallow Assessment: none per EMR Diet Prior to this Study: NPO Temperature Spikes Noted: No Respiratory Status: Room air History of Recent Intubation: No Behavior/Cognition: Lethargic/Drowsy;Pleasant mood;Cooperative Oral Cavity Assessment: Within Functional Limits Oral Care Completed by  SLP: No Oral Cavity - Dentition: Edentulous;Dentures, not available Vision:  (difficult to assess) Self-Feeding Abilities: Total assist Patient Positioning: Upright in bed;Postural control adequate for testing Baseline Vocal Quality: Normal Volitional Cough: Weak Volitional Swallow: Able to elicit    Oral/Motor/Sensory Function Overall Oral Motor/Sensory Function: Within functional limits   Ice Chips Ice chips: Impaired Presentation: Spoon Oral Phase Impairments: Reduced lingual movement/coordination;Poor awareness of bolus Oral Phase Functional Implications: Prolonged oral transit;Oral holding Pharyngeal Phase Impairments: Cough - Immediate   Thin Liquid Thin Liquid: Impaired Presentation: Cup;Straw Oral Phase Impairments: Poor awareness of bolus Oral Phase Functional Implications: Oral holding Pharyngeal  Phase Impairments: Throat Clearing - Immediate    Nectar Thick Nectar Thick Liquid: Not tested   Honey Thick Honey Thick Liquid: Not tested   Puree Puree: Impaired Presentation: Spoon Oral Phase Impairments: Reduced lingual movement/coordination;Poor awareness of bolus Oral Phase Functional Implications: Oral holding   Solid     Solid: Not tested       Gordon Latus, MA, CCC-SLP Acute Rehabilitation Services Office Number: [phone]  Corliss Dies 02/01/2024,12:41 PM

## 2024-02-01 NOTE — Progress Notes (Signed)
 Patient ID: [name], male   DOB: [birth year], 63 y.o.   MRN: [id number] BP (!) 165/68 Comment: prn hydralazine given  Pulse (!) 53   Temp 98.1 F (36.7 C) (Axillary)   Resp 14   Ht 5\' 6"  (1.676 m)   Wt 89.6 kg   SpO2 96%   BMI 31.88 kg/m  Alert, confused. Oriented only to person Speech is clear Moves all extremities Change in head CT was minimal. No operative indications at this time.

## 2024-02-01 NOTE — Evaluation (Signed)
 Physical Therapy Evaluation Patient Details Name: [name] MRN: [id number] DOB: 1961-07-27 Today's Date: 02/01/2024  History of Present Illness  Pt is a 63 y.o. male presenting 5/8 after a fall at home. UDS positive for benzodiazepines. EKG with sinus bradycardia. Head CT 5/8 showed 9 mm right subdural hematoma with 3 mm midline shift and small volume acute SAH. PMH significant for seizures and COPD.   Clinical Impression  Pt in bed upon arrival of PT, agreeable to evaluation at this time. The pt is an unreliable historian, and no family was present to determine baseline PLOF or living situation. The pt now presents with limitations in functional mobility, strength, activity tolerance, balance, and cognition due to above dx, and will continue to benefit from skilled PT to address these deficits. The pt needed significant cues for all mobility and appears to have hx of frequent falls (various bruises in different stages of healing).Therefore, recommend continued skilled PT acutely and inpatient rehab after d/c <3hours/day to facilitate maximal recovery and return to independence.       If plan is discharge home, recommend the following: Two people to help with walking and/or transfers;A lot of help with bathing/dressing/bathroom;Assistance with cooking/housework;Assistance with feeding;Direct supervision/assist for medications management;Direct supervision/assist for financial management;Assist for transportation;Help with stairs or ramp for entrance;Supervision due to cognitive status   Can travel by private vehicle   No    Equipment Recommendations None recommended by PT (defer until gait training completed)  Recommendations for Other Services       Functional Status Assessment Patient has had a recent decline in their functional status and demonstrates the ability to make significant improvements in function in a reasonable and predictable amount of time.     Precautions /  Restrictions Precautions Precautions: Fall;Other (comment) (posey, bil mitts, R wrist restraint) Recall of Precautions/Restrictions: Impaired Restrictions Weight Bearing Restrictions Per Provider Order: No      Mobility  Bed Mobility Overal bed mobility: Needs Assistance Bed Mobility: Supine to Sit, Sit to Supine     Supine to sit: Mod assist, +2 for physical assistance, +2 for safety/equipment Sit to supine: Mod assist, +2 for physical assistance, +2 for safety/equipment   General bed mobility comments: Mod+2 for all portions predominantly due to cognition and need for dense cues    Transfers Overall transfer level: Needs assistance Equipment used: Rolling walker (2 wheels) Transfers: Sit to/from Stand Sit to Stand: Mod assist, +2 safety/equipment, +2 physical assistance           General transfer comment: dense cues for hand placement, sequence, initiation    Ambulation/Gait               General Gait Details: pt unable to maintain standing long enough to take steps     Balance Overall balance assessment: Needs assistance Sitting-balance support: Bilateral upper extremity supported, Single extremity supported, Feet supported Sitting balance-Leahy Scale: Poor Sitting balance - Comments: CGA for safety at all times EOB   Standing balance support: Bilateral upper extremity supported, During functional activity, Reliant on assistive device for balance Standing balance-Leahy Scale: Poor                               Pertinent Vitals/Pain Pain Assessment Pain Assessment: Faces Faces Pain Scale: Hurts little more Pain Location: generalized, back, L hip Pain Descriptors / Indicators: Aching Pain Intervention(s): Limited activity within patient's tolerance, Monitored during session, Repositioned    Home Living  Family/patient expects to be discharged to:: Private residence Living Arrangements: Spouse/significant other                  Additional Comments: pt not able to answer questions, per wife, pt has wife and daughter, neither present during session.    Prior Function Prior Level of Function : Patient poor historian/Family not available             Mobility Comments: reports rollator use, no family present to confirm. RN reports repeated falls ADLs Comments: pt unable to report at time of eval as he is not oriented and reports we are in his home     Extremity/Trunk Assessment   Upper Extremity Assessment Upper Extremity Assessment: Defer to OT evaluation    Lower Extremity Assessment Lower Extremity Assessment: Generalized weakness;Difficult to assess due to impaired cognition (not formally tested due to poor command following for MMT, no focal weakness, pt did not answer regarding changes in sensation)    Cervical / Trunk Assessment Cervical / Trunk Assessment: Kyphotic  Communication   Communication Communication: No apparent difficulties    Cognition Arousal: Alert Behavior During Therapy: Impulsive, Lability   PT - Cognitive impairments: Difficult to assess, Orientation, Awareness, Memory, Sequencing, Problem solving, Safety/Judgement Difficult to assess due to:  (participation) Orientation impairments: Place, Time                   PT - Cognition Comments: pt following commands inconsistently. not able to answer questions about PLOF, no family/caregiver present toconfirm baseline. pt oriented to self, states he is at home. Following commands: Impaired Following commands impaired: Follows one step commands inconsistently, Follows one step commands with increased time     Cueing Cueing Techniques: Verbal cues     General Comments General comments (skin integrity, edema, etc.): VSS during session, however, with return to supine HR with rapid drop from 100 back to 50s; per RN pt with frequent brady to 30s today    Exercises     Assessment/Plan    PT Assessment Patient needs  continued PT services  PT Problem List Decreased strength;Decreased range of motion;Decreased activity tolerance;Decreased balance;Decreased mobility;Decreased cognition;Decreased knowledge of use of DME;Decreased safety awareness;Decreased knowledge of precautions       PT Treatment Interventions DME instruction;Gait training;Functional mobility training;Stair training;Therapeutic activities;Therapeutic exercise;Balance training;Neuromuscular re-education;Cognitive remediation;Patient/family education    PT Goals (Current goals can be found in the Care Plan section)  Acute Rehab PT Goals Patient Stated Goal: to go home PT Goal Formulation: With patient Time For Goal Achievement: 02/15/24 Potential to Achieve Goals: Fair    Frequency Min 2X/week        AM-PAC PT "6 Clicks" Mobility  Outcome Measure Help needed turning from your back to your side while in a flat bed without using bedrails?: A Little Help needed moving from lying on your back to sitting on the side of a flat bed without using bedrails?: A Lot Help needed moving to and from a bed to a chair (including a wheelchair)?: A Lot Help needed standing up from a chair using your arms (e.g., wheelchair or bedside chair)?: A Lot Help needed to walk in hospital room?: Total Help needed climbing 3-5 steps with a railing? : Total 6 Click Score: 11    End of Session Equipment Utilized During Treatment: Gait belt Activity Tolerance: Patient limited by fatigue Patient left: in bed;with call bell/phone within reach;with bed alarm set;with restraints reapplied;with SCD's reapplied Nurse Communication: Mobility status PT Visit  Diagnosis: Unsteadiness on feet (R26.81);Muscle weakness (generalized) (M62.81);History of falling (Z91.81)    Time: 1610-9604 PT Time Calculation (min) (ACUTE ONLY): 25 min   Charges:   PT Evaluation $PT Eval Moderate Complexity: 1 Mod   PT General Charges $$ ACUTE PT VISIT: 1 Visit         Barnabas Booth, PT, DPT   Acute Rehabilitation Department Office [phone] Secure Chat Communication Preferred  Lona Rist 02/01/2024, 6:07 PM

## 2024-02-01 NOTE — Progress Notes (Signed)
 NAME:  [name], MRN:  [id number], DOB:  Jul 03, 1961, LOS: 2 ADMISSION DATE:  01/30/2024, CONSULTATION DATE:  02/01/2024  REFERRING MD:  [name], EDP, CHIEF COMPLAINT:  fall, hypothermia   History of Present Illness:  63 year old man who was brought in by EMS from home after a fall.  Apparently wife called, patient is currently ANO x 1 and history obtained after chart review, unable to reach wife.  His heart rate initially was in the 80s, A/O x 1 per EMS, bradycardic to 40s in the ED , noted to be hypothermic 94.  Bruises of multiple ages left hip left eye and left shoulder Labs significant for no leukocytosis, hemoglobin 9.3, mild thrombocytopenia 1 27K, mild hypokalemia 3.3, hypomagnesemia 1.5, normal LFTs.  UDS positive for benzodiazepines, tox screen negative, ammonia 19, TSH 48.  EKG showed sinus bradycardia He was given 1 g of magnesium , 100 mg of IV Solu-Cortef  and 200 mics of levothyroxine  IV Head CT showed 9 mm right subdural hematoma with 3 mm midline shift and small volume acute SAH, cervical spine was negative. PCCM consulted for admission  Pertinent  Medical History  Seizure disorder COPD  Significant Hospital Events: Including procedures, antibiotic start and stop dates in addition to other pertinent events   5/8 Admitted 5/9 No overnight issues, patient remained confused and restless, trying to get out of bed 5/10 ongoing issues with agitation overnight  Interim History / Subjective:  Able to state name this a.m. reviews to situation and location  Objective    Physical exam: General: Acute on chronic ill-appearing deconditioned middle-age male lying sideways in bed in no acute distress HEENT: [place]/AT, MM pink/moist, PERRL,  Neuro: Alert to self only CV: s1s2 regular rate and rhythm, no murmur, rubs, or gallops,  PULM: Clear to auscultation bilaterally, no increased work of breathing, no added breath sounds GI: soft, bowel sounds active in all 4 quadrants, non-tender,  non-distended, tolerating TF Extremities: warm/dry, no edema  Skin: no rashes or lesions  Blood pressure (!) 156/66, pulse (!) 52, temperature 98.1 F (36.7 C), temperature source Axillary, resp. rate 14, height 5\' 6"  (1.676 m), weight 89.6 kg, SpO2 99%.        Intake/Output Summary (Last 24 hours) at 02/01/2024 4098 Last data filed at 02/01/2024 0900 Gross per 24 hour  Intake 1502.52 ml  Output 675 ml  Net 827.52 ml   Filed Weights   01/30/24 2002 01/31/24 0500 02/01/24 0500  Weight: 93.7 kg 91.3 kg 89.6 kg    Resolved Hospital Problem list     Assessment & Plan:  Acute right subdural hematoma with 3 mm midline shift Acute traumatic subarachnoid hemorrhage status post fall Acute encephalopathy in the setting of right subdural hematoma -Repeat head CT 5/9 with slightly increased size of right larger than left subdural hematomas with 3 mm leftward midline shift Seizure disorder history P: Neurosurgery following, appreciate assistance Maintain neuro protective measures Nutrition and bowel regiment  Seizure precautions  Aspirations precautions  Frequent neurochecks SBP goal less than 160 Empiric Keppra  per neurosurgery Home Depakote  resumed 5/9  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, noncompliant with meds -TSH on admission 48.029 with free T3 less than 0.3 and T4 0.30 P: Continue IV Synthroid  per pharmacy dosing Continuous telemetry as below Supportive care as above  Bradycardia/hypothermia due to hypothyroidism P: Hemodynamically he remains stable with bradycardia Continuous telemetry Close monitoring of hemodynamics in the ICU setting Optimize electrolytes  Prediabetes P: Continue SSI CBG goal 140-180 CBG checks every 4  Anxiety  disorder on chronic benzodiazepine -It appears patient is on high-dose benzodiazepines upwards of 4 mg of Xanax  nightly P: Increase Xanax  slightly to 1 mg twice daily as of 5/10 Monitor for signs of withdrawal  COPD, not in  exacerbation Tobacco dependence P: Supplemental oxygen as needed for sat goal greater than 2 Aspiration precautions Cessation education when appropriate Courage frequent and adequate pulmonary hygiene Continue home Brovana  and Yupelri    Best Practice (right click and "Reselect all SmartList Selections" daily)   Diet/type: NPO if failed swallow evaluation, place cortrak DVT prophylaxis SCD Pressure ulcer(s): N/A GI prophylaxis: PPI Lines: N/A Foley:  N/A Code Status:  full code Last date of multidisciplinary goals of care discussion: 5/9: Patient's wife was updated over the phone, decision was to continue full scope of care   Danny Kings D. Harris, NP-C [facility] Personal contact information can be found on Amion  If no contact or response made please call [phone] 02/01/2024, 9:38 AM

## 2024-02-01 NOTE — Progress Notes (Signed)
 Patient being verbally aggressive and threatening. Patient stated "I am going to beat the hell out of you if you don't get away from me" as RN was attempting to replace cardiac electrodes/leads.

## 2024-02-02 DIAGNOSIS — E039 Hypothyroidism, unspecified: Secondary | ICD-10-CM | POA: Diagnosis not present

## 2024-02-02 DIAGNOSIS — S066X0A Traumatic subarachnoid hemorrhage without loss of consciousness, initial encounter: Secondary | ICD-10-CM | POA: Diagnosis not present

## 2024-02-02 DIAGNOSIS — S065XAA Traumatic subdural hemorrhage with loss of consciousness status unknown, initial encounter: Secondary | ICD-10-CM | POA: Diagnosis not present

## 2024-02-02 DIAGNOSIS — R7303 Prediabetes: Secondary | ICD-10-CM | POA: Diagnosis not present

## 2024-02-02 DIAGNOSIS — R001 Bradycardia, unspecified: Secondary | ICD-10-CM | POA: Diagnosis not present

## 2024-02-02 LAB — SODIUM: Sodium: 124 mmol/L — ABNORMAL LOW (ref 135–145)

## 2024-02-02 LAB — GLUCOSE, CAPILLARY
Glucose-Capillary: 161 mg/dL — ABNORMAL HIGH (ref 70–99)
Glucose-Capillary: 168 mg/dL — ABNORMAL HIGH (ref 70–99)
Glucose-Capillary: 182 mg/dL — ABNORMAL HIGH (ref 70–99)
Glucose-Capillary: 190 mg/dL — ABNORMAL HIGH (ref 70–99)
Glucose-Capillary: 157 mg/dL — ABNORMAL HIGH (ref 70–99)
Glucose-Capillary: 224 mg/dL — ABNORMAL HIGH (ref 70–99)

## 2024-02-02 LAB — BASIC METABOLIC PANEL WITH GFR
Anion gap: 10 (ref 5–15)
BUN: 23 mg/dL (ref 8–23)
CO2: 24 mmol/L (ref 22–32)
Calcium: 9 mg/dL (ref 8.9–10.3)
Chloride: 92 mmol/L — ABNORMAL LOW (ref 98–111)
Creatinine, Ser: 0.58 mg/dL — ABNORMAL LOW (ref 0.61–1.24)
GFR, Estimated: >60 mL/min (ref >60)
Glucose, Bld: 153 mg/dL — ABNORMAL HIGH (ref 70–99)
Potassium: 3.7 mmol/L (ref 3.5–5.1)
Sodium: 126 mmol/L — ABNORMAL LOW (ref 135–145)

## 2024-02-02 LAB — MAGNESIUM: Magnesium: 2 mg/dL (ref 1.7–2.4)

## 2024-02-02 LAB — CBC
HCT: 27.8 % — ABNORMAL LOW (ref 39.0–52.0)
Hemoglobin: 10 g/dL — ABNORMAL LOW (ref 13.0–17.0)
MCH: 31.2 pg (ref 26.0–34.0)
MCHC: 36 g/dL (ref 30.0–36.0)
MCV: 86.6 fL (ref 80.0–100.0)
Platelets: 144 10*3/uL — ABNORMAL LOW (ref 150–400)
RBC: 3.21 MIL/uL — ABNORMAL LOW (ref 4.22–5.81)
RDW: 14.6 % (ref 11.5–15.5)
WBC: 14.7 10*3/uL — ABNORMAL HIGH (ref 4.0–10.5)
nRBC: 0.3 % — ABNORMAL HIGH (ref 0.0–0.2)

## 2024-02-02 LAB — OSMOLALITY: Osmolality: 278 mosm/kg (ref 275–295)

## 2024-02-02 LAB — PHOSPHORUS: Phosphorus: 2.1 mg/dL — ABNORMAL LOW (ref 2.5–4.6)

## 2024-02-02 LAB — SODIUM, URINE, RANDOM: Sodium, Ur: 60 mmol/L

## 2024-02-02 MED ORDER — LEVOTHYROXINE SODIUM 75 MCG PO TABS
150.0000 ug | ORAL_TABLET | Freq: Every day | ORAL | Status: DC
  Administered 2024-02-03 – 2024-02-05 (×3): 150 ug
  Filled 2024-02-02 (×3): qty 2

## 2024-02-02 MED ORDER — CITALOPRAM HYDROBROMIDE 10 MG PO TABS
20.0000 mg | ORAL_TABLET | Freq: Every day | ORAL | Status: DC
  Administered 2024-02-02: 20 mg via ORAL
  Filled 2024-02-02: qty 2

## 2024-02-02 MED ORDER — CITALOPRAM HYDROBROMIDE 20 MG PO TABS
20.0000 mg | ORAL_TABLET | Freq: Every day | ORAL | Status: DC
  Administered 2024-02-03: 20 mg
  Filled 2024-02-02: qty 1

## 2024-02-02 MED ORDER — POTASSIUM PHOSPHATES 15 MMOLE/5ML IV SOLN
30.0000 mmol | Freq: Once | INTRAVENOUS | Status: AC
  Administered 2024-02-02: 30 mmol via INTRAVENOUS
  Filled 2024-02-02: qty 10

## 2024-02-02 MED ORDER — HALOPERIDOL LACTATE 5 MG/ML IJ SOLN
5.0000 mg | Freq: Once | INTRAMUSCULAR | Status: AC
  Administered 2024-02-02: 5 mg via INTRAVENOUS
  Filled 2024-02-02: qty 1

## 2024-02-02 NOTE — Progress Notes (Signed)
 E-link notified of sodium of 124.

## 2024-02-02 NOTE — Progress Notes (Signed)
 Transition of Care [facility]) - CAGE-AID Screening   Patient Details  Name: [name] MRN: [id number] Date of Birth: [birth year]     Asa Bjork, RN Trauma Response Nurse Phone Number: [phone] 02/02/2024, 5:14 PM     CAGE-AID Screening: Substance Abuse Screening unable to be completed due to: : (S) Patient unable to participate (Pt is currently in ICU and intermittently confused.)

## 2024-02-02 NOTE — Progress Notes (Signed)
 NAME:  [name], MRN:  [id number], DOB:  June 08, 1961, LOS: 3 ADMISSION DATE:  01/30/2024, CONSULTATION DATE:  02/02/2024  REFERRING MD:  [facility], EDP, CHIEF COMPLAINT:  fall, hypothermia   History of Present Illness:  63 year old man who was brought in by EMS from home after a fall.  Apparently wife called, patient is currently ANO x 1 and history obtained after chart review, unable to reach wife.  His heart rate initially was in the 80s, A/O x 1 per EMS, bradycardic to 40s in the ED , noted to be hypothermic 94.  Bruises of multiple ages left hip left eye and left shoulder Labs significant for no leukocytosis, hemoglobin 9.3, mild thrombocytopenia 1 27K, mild hypokalemia 3.3, hypomagnesemia 1.5, normal LFTs.  UDS positive for benzodiazepines, tox screen negative, ammonia 19, TSH 48.  EKG showed sinus bradycardia He was given 1 g of magnesium , 100 mg of IV Solu-Cortef  and 200 mics of levothyroxine  IV Head CT showed 9 mm right subdural hematoma with 3 mm midline shift and small volume acute SAH, cervical spine was negative. PCCM consulted for admission  Pertinent  Medical History  Seizure disorder COPD  Significant Hospital Events: Including procedures, antibiotic start and stop dates in addition to other pertinent events   5/8 Admitted 5/9 No overnight issues, patient remained confused and restless, trying to get out of bed 5/10 ongoing issues with agitation overnight 5/11 mentation improved this a.m. with less impulsivity  Interim History / Subjective:  Able to answer all orientation questions this a.m.  Objective    Physical exam: General: Acute on chronic ill-appearing middle-aged male lying in bed in no acute distress HEENT: [place]/AT, MM pink/moist, PERRL,  Neuro: Alert and oriented x 3 CV: s1s2 regular rate and rhythm, no murmur, rubs, or gallops,  PULM: Clear to auscultation bilaterally, no increased work of breathing, no added breath sounds GI: soft, bowel sounds active in all  4 quadrants, non-tender, non-distended, tolerating TF Extremities: warm/dry, no edema  Skin: Scattered bruising to face   Blood pressure 134/81, pulse 84, temperature 98.6 F (37 C), temperature source Axillary, resp. rate 10, height 5\' 6"  (1.676 m), weight 89.6 kg, SpO2 95%.        Intake/Output Summary (Last 24 hours) at 02/02/2024 0748 Last data filed at 02/02/2024 0700 Gross per 24 hour  Intake 2965.81 ml  Output 1225 ml  Net 1740.81 ml   Filed Weights   01/31/24 0500 02/01/24 0500 02/02/24 0500  Weight: 91.3 kg 89.6 kg 89.6 kg    Resolved Hospital Problem list     Assessment & Plan:  Acute right subdural hematoma with 3 mm midline shift Acute traumatic subarachnoid hemorrhage status post fall Acute encephalopathy in the setting of right subdural hematoma -Repeat head CT 5/9 with slightly increased size of right larger than left subdural hematomas with 3 mm leftward midline shift Seizure disorder history P: Neurosurgery following, appreciate assistance Neuroprotective measures Seizure precautions Aspiration precautions Frequent neurochecks SBP goal less than 160 Empiric Keppra  per neurosurgery Continue home Depakote   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, noncompliant with meds -TSH on admission 48.029 with free T3 less than 0.3 and T4 0.30 P: Continue IV Synthroid  per pharmacy dosing Continuous telemetry as below Supportive care  Bradycardia/hypothermia due to hypothyroidism -resolved - With initiation of IV Synthroid  bradycardia has now resolved P: Continuous telemetry Avoid beta-blockers Optimize electrolytes  Prediabetes P: Continue SSI CBG goal 140-180 CBG checks every 4  Anxiety disorder on chronic benzodiazepine -It appears patient is on high-dose  benzodiazepines upwards of 4 mg of Xanax  nightly P: Continue reduced dosed home Xanax  1 mg twice daily Monitor for signs of withdrawal  COPD, not in exacerbation Tobacco dependence P: Aspiration  precautions Continue home Brovana  and Yupelri  Smoking cessation education when appropriate  Hyponatremia - Sodium level 132 on admission with downtrend to 126 AM 5/11 P: Check serum osmolarity and urine sodium   Best Practice (right click and "Reselect all SmartList Selections" daily)   Diet/type: NPO if failed swallow evaluation, place cortrak DVT prophylaxis SCD Pressure ulcer(s): N/A GI prophylaxis: PPI Lines: N/A Foley:  N/A Code Status:  full code Last date of multidisciplinary goals of care discussion: 5/9: Patient's wife was updated over the phone, decision was to continue full scope of care   Desta Bujak D. Harris, NP-C [facility] Personal contact information can be found on Amion  If no contact or response made please call [phone] 02/02/2024, 7:48 AM

## 2024-02-03 ENCOUNTER — Inpatient Hospital Stay (HOSPITAL_COMMUNITY): Payer: Medicare (Managed Care)

## 2024-02-03 DIAGNOSIS — G9340 Encephalopathy, unspecified: Secondary | ICD-10-CM | POA: Diagnosis not present

## 2024-02-03 DIAGNOSIS — S065XAA Traumatic subdural hemorrhage with loss of consciousness status unknown, initial encounter: Secondary | ICD-10-CM | POA: Diagnosis not present

## 2024-02-03 DIAGNOSIS — R4182 Altered mental status, unspecified: Secondary | ICD-10-CM | POA: Diagnosis not present

## 2024-02-03 DIAGNOSIS — R296 Repeated falls: Secondary | ICD-10-CM | POA: Diagnosis not present

## 2024-02-03 DIAGNOSIS — R569 Unspecified convulsions: Secondary | ICD-10-CM

## 2024-02-03 LAB — URINALYSIS, W/ REFLEX TO CULTURE (INFECTION SUSPECTED)
Bilirubin Urine: NEGATIVE
Glucose, UA: 50 mg/dL — AB
Ketones, ur: NEGATIVE mg/dL
Nitrite: NEGATIVE
Protein, ur: NEGATIVE mg/dL
Specific Gravity, Urine: 1.011 (ref 1.005–1.030)
pH: 6 (ref 5.0–8.0)

## 2024-02-03 LAB — OSMOLALITY, URINE: Osmolality, Ur: 399 mosm/kg (ref 300–900)

## 2024-02-03 LAB — BASIC METABOLIC PANEL WITH GFR
Anion gap: 12 (ref 5–15)
BUN: 23 mg/dL (ref 8–23)
CO2: 22 mmol/L (ref 22–32)
Calcium: 8.8 mg/dL — ABNORMAL LOW (ref 8.9–10.3)
Chloride: 93 mmol/L — ABNORMAL LOW (ref 98–111)
Creatinine, Ser: 0.64 mg/dL (ref 0.61–1.24)
GFR, Estimated: >60 mL/min (ref >60)
Glucose, Bld: 156 mg/dL — ABNORMAL HIGH (ref 70–99)
Potassium: 3.8 mmol/L (ref 3.5–5.1)
Sodium: 127 mmol/L — ABNORMAL LOW (ref 135–145)

## 2024-02-03 LAB — GLUCOSE, CAPILLARY
Glucose-Capillary: 169 mg/dL — ABNORMAL HIGH (ref 70–99)
Glucose-Capillary: 158 mg/dL — ABNORMAL HIGH (ref 70–99)
Glucose-Capillary: 189 mg/dL — ABNORMAL HIGH (ref 70–99)
Glucose-Capillary: 211 mg/dL — ABNORMAL HIGH (ref 70–99)
Glucose-Capillary: 199 mg/dL — ABNORMAL HIGH (ref 70–99)

## 2024-02-03 LAB — CBC
HCT: 27.5 % — ABNORMAL LOW (ref 39.0–52.0)
Hemoglobin: 10 g/dL — ABNORMAL LOW (ref 13.0–17.0)
MCH: 31.9 pg (ref 26.0–34.0)
MCHC: 36.4 g/dL — ABNORMAL HIGH (ref 30.0–36.0)
MCV: 87.9 fL (ref 80.0–100.0)
Platelets: 157 10*3/uL (ref 150–400)
RBC: 3.13 MIL/uL — ABNORMAL LOW (ref 4.22–5.81)
RDW: 15.7 % — ABNORMAL HIGH (ref 11.5–15.5)
WBC: 15.2 10*3/uL — ABNORMAL HIGH (ref 4.0–10.5)
nRBC: 0.3 % — ABNORMAL HIGH (ref 0.0–0.2)

## 2024-02-03 LAB — OSMOLALITY: Osmolality: 276 mosm/kg (ref 275–295)

## 2024-02-03 LAB — MAGNESIUM: Magnesium: 2.1 mg/dL (ref 1.7–2.4)

## 2024-02-03 LAB — PROCALCITONIN: Procalcitonin: 0.38 ng/mL

## 2024-02-03 LAB — PHOSPHORUS: Phosphorus: 2.7 mg/dL (ref 2.5–4.6)

## 2024-02-03 LAB — VALPROIC ACID LEVEL: Valproic Acid Lvl: 105 ug/mL — ABNORMAL HIGH (ref 50–100)

## 2024-02-03 LAB — BRAIN NATRIURETIC PEPTIDE: B Natriuretic Peptide: 177.1 pg/mL — ABNORMAL HIGH (ref 0.0–100.0)

## 2024-02-03 LAB — T4, FREE: Free T4: 0.31 ng/dL — ABNORMAL LOW (ref 0.61–1.12)

## 2024-02-03 LAB — C-REACTIVE PROTEIN: CRP: 1 mg/dL — ABNORMAL HIGH (ref <1.0)

## 2024-02-03 LAB — TSH: TSH: 30.797 u[IU]/mL — ABNORMAL HIGH (ref 0.350–4.500)

## 2024-02-03 LAB — URIC ACID: Uric Acid, Serum: 1.6 mg/dL — ABNORMAL LOW (ref 3.7–8.6)

## 2024-02-03 LAB — CREATININE, URINE, RANDOM: Creatinine, Urine: 44 mg/dL

## 2024-02-03 LAB — SODIUM, URINE, RANDOM: Sodium, Ur: <10 mmol/L

## 2024-02-03 MED ORDER — TRAMADOL HCL 50 MG PO TABS: 50.0000 mg | ORAL_TABLET | Freq: Four times a day (QID) | ORAL | Status: DC | PRN

## 2024-02-03 MED ORDER — HYDRALAZINE HCL 50 MG PO TABS
50.0000 mg | ORAL_TABLET | Freq: Three times a day (TID) | ORAL | Status: DC
  Administered 2024-02-03 – 2024-02-05 (×7): 50 mg
  Filled 2024-02-03 (×7): qty 1

## 2024-02-03 MED ORDER — HALOPERIDOL LACTATE 5 MG/ML IJ SOLN: 2.0000 mg | Freq: Four times a day (QID) | INTRAMUSCULAR | Status: DC | PRN

## 2024-02-03 MED ORDER — FUROSEMIDE 10 MG/ML IJ SOLN
40.0000 mg | Freq: Once | INTRAMUSCULAR | Status: AC
  Administered 2024-02-03: 40 mg via INTRAVENOUS
  Filled 2024-02-03: qty 4

## 2024-02-03 NOTE — Progress Notes (Signed)
 Speech Language Pathology Treatment: Dysphagia  Patient Details Name: [name] MRN: [id number] DOB: [birth year] Today's Date: 02/03/2024 Time: 3086-5784 SLP Time Calculation (min) (ACUTE ONLY): 13 min  Assessment / Plan / Recommendation Clinical Impression  Pt primarily keeps his eyes closed, opening his R > L eye when cued to do so, but not sustaining opening. Level of alertness and attention to task remain quite variable and impact acceptance of boluses, requiring Mod-Max cues from SLP. There is also some oral holding, but he is consistently swallowing. There are no overt s/s of aspiration but he also cannot be challenged with larger volumes given mentation. Recommend that he remain NPO pending further improvements in mentation (will also f/u for full cognitive-linguistic assessment), but would start allowing ice chips or small spoonfuls of water after oral care when fully alert and accepting.    HPI HPI: Pt is a 63 y.o. male who was brought to ED by EMS from home after a fall. CT head (01/30/23) revealed "Slightly increased size of right larger than left subdural  hematomas. Small volume subarachnoid hemorrhage with mildly increased  hemorrhage in the left lateral ventricle. Failed yale 02/01/24. PMH: Seizure disorder, COPD.      SLP Plan  Continue with current plan of care      Recommendations for follow up therapy are one component of a multi-disciplinary discharge planning process, led by the attending physician.  Recommendations may be updated based on patient status, additional functional criteria and insurance authorization.    Recommendations  Diet recommendations: NPO;Other(comment) (ice chips after oral care if alert) Medication Administration: Via alternative means                  Oral care QID     Dysphagia, unspecified (R13.10)     Continue with current plan of care     Beth Brooke., M.A. CCC-SLP Acute Rehabilitation Services Office: [phone]  Secure  chat preferred   02/03/2024, 12:15 PM

## 2024-02-03 NOTE — Care Management Important Message (Signed)
 Important Message  Patient Details  Name: [name] MRN: [id number] Date of Birth: [birth year]   Important Message Given:  Yes - Medicare IM     Wynonia Hedges 02/03/2024, 3:12 PM

## 2024-02-03 NOTE — Progress Notes (Addendum)
 PROGRESS NOTE                                                                                                                                                                                                             Patient Demographics:    [name], is a 63 y.o. male, DOB - 08/26/1961, WJX:[id number]  Outpatient Primary MD for the patient is [name], Danny Ang, MD    LOS - 4  Admit date - 01/30/2024    Chief Complaint  Patient presents with   Fall   Altered Mental Status       Brief Narrative (HPI from H&P)   63 year old man who was brought in by EMS from home after a fall.  Apparently wife called, patient is currently ANO x 1 and history obtained after chart review, unable to reach wife.  His heart rate initially was in the 80s, A/O x 1 per EMS, bradycardic to 40s in the ED , noted to be hypothermic 94.  Bruises of multiple ages left hip left eye and left shoulder Labs significant for no leukocytosis, hemoglobin 9.3, mild thrombocytopenia 1 27K, mild hypokalemia 3.3, hypomagnesemia 1.5, normal LFTs.  UDS positive for benzodiazepines, tox screen negative, ammonia 19, TSH 48.  EKG showed sinus bradycardia.  He was given 1 g of magnesium , 100 mg of IV Solu-Cortef  and 200 mics of levothyroxine  IV. Head CT showed 9 mm right subdural hematoma with 3 mm midline shift and small volume acute SAH, cervical spine was negative.  He was admitted to ICU by pulmonary critical care, seen by neurosurgeon [name], stabilized and transferred to my care on 02/03/2024 on day 4 of his hospital stay, still little confused with NG tube in.  Significant Hospital Events: Including procedures, antibiotic start and stop dates in addition to other pertinent events   5/8 Admitted 5/9 No overnight issues, patient remained confused and restless, trying to get out of bed 5/10 ongoing issues with agitation overnight 5/11 mentation improved this a.m.  with less impulsivity 02/03/2024 transferred to [facility]   Subjective:    [name] today has, mild generalized headache, No chest pain, No abdominal pain - No Nausea, No new weakness tingling or numbness, no SOB   Assessment  & Plan :    Acute right subdural hematoma with 3 mm midline shift - Acute traumatic  subarachnoid hemorrhage status post fall - Acute encephalopathy in the setting of right subdural hematoma -Repeat head CT 5/9 with slightly increased size of right larger than left subdural hematomas with 3 mm leftward midline shift, neurosurgery on board, appreciate their input.  Currently on Depakote , trying to keep systolic blood pressure under 846.  Continue seizure and aspiration precautions, monitor clinically.  Defer management of this issue to neurosurgery.  Underlying history of seizure disorder.  Had a fall at home causing subdural hematoma, currently on prophylactic Keppra , will get input from neurology as to long-term management of his seizures.  Was on Depakote  at home, question compliance, levels on the eighth were satisfactory.   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  Unseld on compliance.     Bradycardia/hypothermia due to hypothyroidism - resolved - With initiation of IV Synthroid  bradycardia has now resolved, continue to monitor on telemetry.  Echo stable.     Anxiety disorder on chronic benzodiazepine -It appears patient is on high-dose benzodiazepines upwards of 4 mg of Xanax  nightly P: Continue reduced dosed home Xanax  1 mg twice daily Monitor for signs of withdrawal   COPD, not in exacerbation Tobacco dependence P: Aspiration precautions Continue home Brovana  and Yupelri  Smoking cessation education when appropriate   Hyponatremia - Sodium level 132 on admission with trending after he received IV  fluids in the ICU, clinically appears euvolemic, with head injury and dural bleed suspicious for SIADH, fluid restriction, Lasix  and monitor if all fails then Samsca on 02/04/2024.  Prediabetes P: Continue SSI CBG goal 140-180 CBG checks every 4  Lab Results  Component Value Date   HGBA1C 6.1 (H) 01/31/2024   CBG (last 3)  Recent Labs    02/02/24 2000 02/02/24 2326 02/03/24 0346  GLUCAP 157* 224* 169*          Condition -  Guarded  Family Communication  :   Wife [name] [phone] on 02/03/24  Message left 8:30 AM.  Daughter [name] [phone]  on 02/03/2024  Code Status : Full code  Consults  : PCCM, neurosurgery [name], neurology  PUD Prophylaxis : PPI   Procedures  :     CT 5/9 -  1. Slightly increased size of right larger than left subdural hematomas. Unchanged mass effect and 3 mm of leftward midline shift. 2. Small volume subarachnoid hemorrhage with mildly increased hemorrhage in the left lateral ventricle, possibly redistribution.  CT 5/8 -   1. Acute 9 mm thick right cerebral convexity subdural hemorrhage which extends along the right tentorial leaflet. Resulting 3 mm leftward midline shift. 2. Scattered small volume of acute subarachnoid hemorrhage. 3. No evidence of acute fracture or traumatic malalignment in the cervical spine.  TTE - 1. Left ventricular ejection fraction, by estimation, is 60 to 65% . The left ventricle has normal function. The left ventricle has no regional wall motion abnormalities. Left ventricular diastolic parameters are consistent with Grade I diastolic dysfunction ( impaired relaxation) . 2. Right ventricular systolic function is normal. The right ventricular size is normal. 3. The mitral valve is normal in structure. No evidence of mitral valve regurgitation. No evidence of mitral stenosis. 4. The aortic valve was not well visualized. Aortic valve regurgitation is mild. No aortic stenosis is present. 5. The inferior vena  cava is normal in size with greater than 50% respiratory variability, suggesting  right atrial pressure of 3 mmHg.      Disposition Plan  :    Status is: Inpatient  DVT Prophylaxis  :    SCDs Start: 01/30/24 2330   Lab Results  Component Value Date   PLT 157 02/03/2024    Diet :  Diet Order             Diet NPO time specified  Diet effective now                    Inpatient Medications  Scheduled Meds:  ALPRAZolam   1 mg Per Tube BID   ALPRAZolam   1 mg Oral Once   arformoterol   15 mcg Nebulization BID   budesonide  (PULMICORT ) nebulizer solution  0.25 mg Nebulization BID   Chlorhexidine  Gluconate Cloth  6 each Topical Daily   citalopram   20 mg Per Tube Daily   feeding supplement (PROSource TF20)  60 mL Per Tube BID   furosemide   40 mg Intravenous Once   hydrALAZINE  50 mg Per Tube Q8H   insulin  aspart  0-9 Units Subcutaneous Q4H   levothyroxine   150 mcg Per Tube Q0600   multivitamin with minerals  1 tablet Per Tube Daily   nicotine  14 mg Transdermal Daily   mouth rinse  15 mL Mouth Rinse 4 times per day   pantoprazole  (PROTONIX ) IV  40 mg Intravenous QHS   revefenacin   175 mcg Nebulization Daily   thiamine  100 mg Per Tube Daily   valproic acid   500 mg Per Tube BID   Continuous Infusions:  feeding supplement (OSMOLITE 1.5 CAL) 1,000 mL (02/02/24 2044)   PRN Meds:.docusate sodium , haloperidol lactate, hydrALAZINE, labetalol, mouth rinse, polyethylene glycol, traMADol   Antibiotics  :    Anti-infectives (From admission, onward)    None         Objective:   Vitals:   02/02/24 2332 02/03/24 0400 02/03/24 0430 02/03/24 0725  BP: (!) 140/73 137/71    Pulse: (!) 106 (!) 115  (!) 103  Resp: 19 14  19   Temp: 98.3 F (36.8 C) 98.8 F (37.1 C)  97.8 F (36.6 C)  TempSrc: Axillary Axillary  Oral  SpO2: 97% 93%  95%  Weight:   91.1 kg   Height:        Wt Readings from Last 3 Encounters:  02/03/24 91.1 kg  09/06/23 94.8 kg  07/11/22 103.4 kg      Intake/Output Summary (Last 24 hours) at 02/03/2024 0826 Last data filed at 02/03/2024 0347 Gross per 24 hour  Intake 1145.25 ml  Output 1600 ml  Net -454.75 ml     Physical Exam  Awake minimally confused, No new F.N deficits, NG tube in place [place].AT,PERRAL Supple Neck, No JVD,   Symmetrical Chest wall movement, Good air movement bilaterally, CTAB RRR,No Gallops,Rubs or new Murmurs,  +ve B.Sounds, Abd Soft, No tenderness,   No Cyanosis, Clubbing or edema     RN pressure injury documentation: Pressure Injury 01/31/24 Coccyx Mid Deep Tissue Pressure Injury - Purple or maroon localized area of discolored intact skin or blood-filled blister due to damage of underlying soft tissue from pressure and/or shear. (Active)  01/31/24 0100  Location: Coccyx  Location Orientation: Mid  Staging: Deep Tissue Pressure Injury - Purple or maroon localized area of discolored intact skin or blood-filled blister due to damage of underlying soft tissue from pressure and/or shear.  Wound Description (Comments):   Present on Admission: Yes  Dressing Type Foam -  Lift dressing to assess site every shift 02/02/24 2000      Data Review:    Recent Labs  Lab 01/30/24 1952 01/31/24 0528 02/01/24 0644 02/02/24 0549 02/03/24 0445  WBC 7.8 9.1 13.2* 14.7* 15.2*  HGB 9.3* 10.7* 10.0* 10.0* 10.0*  HCT 27.2* 30.3* 27.8* 27.8* 27.5*  PLT 127* 147* 147* 144* 157  MCV 91.9 86.8 85.8 86.6 87.9  MCH 31.4 30.7 30.9 31.2 31.9  MCHC 34.2 35.3 36.0 36.0 36.4*  RDW 15.2 14.6 14.3 14.6 15.7*    Recent Labs  Lab 01/30/24 1952 01/30/24 2020 01/30/24 2032 01/31/24 0528 02/01/24 0644 02/01/24 1351 02/01/24 1651 02/01/24 2105 02/02/24 0148 02/02/24 0549 02/03/24 0445  NA 132*  --   --  127* 126*   < > 126* 123* 124* 126* 127*  K 3.3*  --   --  3.3* 3.0*  --   --   --   --  3.7 3.8  CL 96*  --   --  88* 89*  --   --   --   --  92* 93*  CO2 26  --   --  26 24  --   --   --   --  24 22  ANIONGAP 10   --   --  13 13  --   --   --   --  10 12  GLUCOSE 134*  --   --  115* 189*  --   --   --   --  153* 156*  BUN 20  --   --  14 22  --   --   --   --  23 23  CREATININE 1.03  --   --  0.88 0.74  --   --   --   --  0.58* 0.64  AST 22  --   --  24  --   --   --   --   --   --   --   ALT 11  --   --  14  --   --   --   --   --   --   --   ALKPHOS 34*  --   --  44  --   --   --   --   --   --   --   BILITOT 1.2  --   --  1.3*  --   --   --   --   --   --   --   ALBUMIN 3.4*  --   --  4.0  --   --   --   --   --   --   --   LATICACIDVEN  --  2.1*  --   --   --   --   --   --   --   --   --   INR  --   --  1.1 1.0  --   --   --   --   --   --   --   TSH  --   --  48.029*  --   --   --   --   --   --   --  30.797*  HGBA1C  --   --   --  6.1*  --   --   --   --   --   --   --   AMMONIA  --   --  19  --   --   --   --   --   --   --   --   MG  --   --  1.5* 1.6* 2.2  --   --   --   --  2.0 2.1  PHOS  --   --   --  3.0 2.2*  --   --   --   --  2.1* 2.7  CALCIUM  8.5*  --   --  9.0 9.0  --   --   --   --  9.0 8.8*   < > = values in this interval not displayed.      Recent Labs  Lab 01/30/24 1952 01/30/24 2020 01/30/24 2032 01/31/24 0528 02/01/24 6578 02/02/24 0549 02/03/24 0445  LATICACIDVEN  --  2.1*  --   --   --   --   --   INR  --   --  1.1 1.0  --   --   --   TSH  --   --  48.029*  --   --   --  30.797*  HGBA1C  --   --   --  6.1*  --   --   --   AMMONIA  --   --  19  --   --   --   --   MG  --   --  1.5* 1.6* 2.2 2.0 2.1  CALCIUM  8.5*  --   --  9.0 9.0 9.0 8.8*    --------------------------------------------------------------------------------------------------------------- No results found for: "CHOL", "HDL", "LDLCALC", "LDLDIRECT", "TRIG", "CHOLHDL"  Lab Results  Component Value Date   HGBA1C 6.1 (H) 01/31/2024   Recent Labs    02/03/24 0445  TSH 30.797*  FREET4 0.31*      Micro Results Recent Results (from the past 240 hours)  Resp panel by RT-PCR (RSV, Flu A&B,  Covid) Anterior Nasal Swab     Status: None   Collection Time: 01/30/24  8:10 PM   Specimen: Anterior Nasal Swab  Result Value Ref Range Status   SARS Coronavirus 2 by RT PCR NEGATIVE NEGATIVE Final   Influenza A by PCR NEGATIVE NEGATIVE Final   Influenza B by PCR NEGATIVE NEGATIVE Final    Comment: (NOTE) The Xpert Xpress SARS-CoV-2/FLU/RSV plus assay is intended as an aid in the diagnosis of influenza from Nasopharyngeal swab specimens and should not be used as a sole basis for treatment. Nasal washings and aspirates are unacceptable for Xpert Xpress SARS-CoV-2/FLU/RSV testing.  Fact Sheet for Patients: BloggerCourse.com  Fact Sheet for Healthcare Providers: SeriousBroker.it  This test is not yet approved or cleared by the [place]  FDA and has been authorized for detection and/or diagnosis of SARS-CoV-2 by FDA under an Emergency Use Authorization (EUA). This EUA will remain in effect (meaning this test can be used) for the duration of the COVID-19 declaration under Section 564(b)(1) of the Act, 21 U.S.C. section 360bbb-3(b)(1), unless the authorization is terminated or revoked.     Resp Syncytial Virus by PCR NEGATIVE NEGATIVE Final    Comment: (NOTE) Fact Sheet for Patients: BloggerCourse.com  Fact Sheet for Healthcare Providers: SeriousBroker.it  This test is not yet approved or cleared by the [place]  FDA and has been authorized for detection and/or diagnosis of SARS-CoV-2 by FDA under an Emergency Use Authorization (EUA). This EUA will remain in effect (meaning this test can be used) for the duration of the COVID-19 declaration under Section 564(b)(1) of the  Act, 21 U.S.C. section 360bbb-3(b)(1), unless the authorization is terminated or revoked.  Performed at [facility], [address] [place] [address], [place], [place] [address]   Blood culture (routine x 2)      Status: None (Preliminary result)   Collection Time: 01/30/24  8:32 PM   Specimen: BLOOD RIGHT FOREARM  Result Value Ref Range Status   Specimen Description BLOOD RIGHT FOREARM  Final   Special Requests   Final    BOTTLES DRAWN AEROBIC AND ANAEROBIC Blood Culture results may not be optimal due to an inadequate volume of blood received in culture bottles   Culture   Final    NO GROWTH 3 DAYS Performed at [facility], [address], [place], [place] [address]    Report Status PENDING  Incomplete  Blood culture (routine x 2)     Status: None (Preliminary result)   Collection Time: 01/30/24  8:40 PM   Specimen: BLOOD  Result Value Ref Range Status   Specimen Description BLOOD RIGHT ANTECUBITAL  Final   Special Requests   Final    BOTTLES DRAWN AEROBIC AND ANAEROBIC Blood Culture adequate volume   Culture   Final    NO GROWTH 3 DAYS Performed at [facility], [address] [place], [place] [address]    Report Status PENDING  Incomplete  MRSA Next Gen by PCR, Nasal     Status: None   Collection Time: 01/31/24  1:03 AM   Specimen: Nasal Mucosa; Nasal Swab  Result Value Ref Range Status   MRSA by PCR Next Gen NOT DETECTED NOT DETECTED Final    Comment: (NOTE) The GeneXpert MRSA Assay (FDA approved for NASAL specimens only), is one component of a comprehensive MRSA colonization surveillance program. It is not intended to diagnose MRSA infection nor to guide or monitor treatment for MRSA infections. Test performance is not FDA approved in patients less than 31 years old. Performed at [facility], [address] [place], [place] [address]     Radiology Report DG Chest Port 1 View Result Date: 02/03/2024 CLINICAL DATA:  Shortness of breath. EXAM: PORTABLE CHEST 1 VIEW COMPARISON:  01/30/2024 FINDINGS: Cardiopericardial silhouette is at upper limits of normal for size. Streaky density at the left base suggest atelectasis. No edema or focal dense airspace  consolidation. No pleural effusion. NG tube tip is in the proximal stomach. Posttraumatic deformity noted left proximal humerus. Telemetry leads overlie the chest. IMPRESSION: Streaky density at the left base suggest atelectasis. Electronically Signed   By: Donnal Fusi M.D.   On: 02/03/2024 07:16   DG Abd Portable 1V Result Date: 02/01/2024 CLINICAL DATA:  Nasogastric tube placement. EXAM: PORTABLE ABDOMEN - 1 VIEW COMPARISON:  Radiograph earlier today FINDINGS: New enteric tube tip and side-port below the diaphragm in the stomach. No bowel dilatation to suggest obstruction. IMPRESSION: Enteric tube tip and side-port below the diaphragm in the stomach. Electronically Signed   By: Chadwick Colonel M.D.   On: 02/01/2024 22:57   DG Abd Portable 1V Result Date: 02/01/2024 CLINICAL DATA:  Orogastric tube placement. EXAM: PORTABLE ABDOMEN - 1 VIEW COMPARISON:  01/31/2023 FINDINGS: The weighted enteric tube is been retracted, the tip is in the distal esophagus. Advancement of at least 10 cm to place the tip in the stomach, 15 cm to place the tip in the distal stomach. Nonobstructive upper abdominal bowel gas pattern. IMPRESSION: Retraction of weighted enteric tube, tip in the distal esophagus. Advancement of at least  10 cm to place the tip in the stomach, 15 cm would place the tip in the distal stomach. Electronically Signed   By: Chadwick Colonel M.D.   On: 02/01/2024 22:05    .rad Signature  -   Lynnwood Sauer M.D on 02/03/2024 at 8:26 AM   -  To page go to [email or web address]

## 2024-02-03 NOTE — Progress Notes (Signed)
 EEG complete, results are pending.

## 2024-02-03 NOTE — TOC Initial Note (Addendum)
 Transition of Care [facility]) - Initial/Assessment Note    Patient Details  Name: [name] MRN: [id number] Date of Birth: 27-Dec-1960  [facility]) CM/SW Contact:    Juliane Och, LCSW Phone Number: 02/03/2024, 10:20 AM  Clinical Narrative:    10:20 AM CSW introduced self and role to patient's spouse, [name] (patient is disoriented x2). CSW informed [name] of therapy recommendation of patient discharging to SNF. [name] was agreeable with recommendation and consented CSW to send referrals to SNFs within [facility]. [name] stated that patient has a cane and broken BSC at home. [name] stated that they share a walker. Patient has HH/DME history with [facility]. Patient's PASRR is pending clinical review.                12:51 PM CSW submitted additional required clinicals requested for PASRR. PASRR is currently pending.   Expected Discharge Plan: Skilled Nursing Facility Barriers to Discharge: Continued Medical Work up, SNF Pending bed offer   Patient Goals and CMS Choice            Expected Discharge Plan and Services In-house Referral: Clinical Social Work   Post Acute Care Choice: Skilled Nursing Facility Living arrangements for the past 2 months: Single Family Home                                      Prior Living Arrangements/Services Living arrangements for the past 2 months: Single Family Home Lives with:: Spouse Patient language and need for interpreter reviewed:: Yes        Need for Family Participation in Patient Care: Yes (Comment)     Criminal Activity/Legal Involvement Pertinent to Current Situation/Hospitalization: No - Comment as needed  Activities of Daily Living   ADL Screening (condition at time of admission) Independently performs ADLs?: No Does the patient have a NEW difficulty with bathing/dressing/toileting/self-feeding that is expected to last >3 days?: Yes (Initiates electronic notice to provider for  possible OT consult) Does the patient have a NEW difficulty with getting in/out of bed, walking, or climbing stairs that is expected to last >3 days?: Yes (Initiates electronic notice to provider for possible PT consult) Does the patient have a NEW difficulty with communication that is expected to last >3 days?: Yes (Initiates electronic notice to provider for possible SLP consult) Is the patient deaf or have difficulty hearing?: No Does the patient have difficulty seeing, even when wearing glasses/contacts?: Yes Does the patient have difficulty concentrating, remembering, or making decisions?: Yes  Permission Sought/Granted Permission sought to share information with : Family Supports, Oceanographer granted to share information with : No (Contact information on chart)  Share Information with NAME: Kayden Gustafson  Permission granted to share info w AGENCY: SNF  Permission granted to share info w Relationship: Spouse  Permission granted to share info w Contact Information: [phone]  Emotional Assessment Appearance:: Appears stated age Attitude/Demeanor/Rapport: Unable to Assess Affect (typically observed): Unable to Assess Orientation: : Oriented to Self, Oriented to Place Alcohol / Substance Use: Not Applicable Psych Involvement: No (comment)  Admission diagnosis:  Hypokalemia [E87.6] Subdural hematoma (HCC) [S06.5XAA] Sinus bradycardia [R00.1] Thrombocytopenia (HCC) [D69.6] Polypharmacy [Z79.899] 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] Elevated TSH [R79.89] Generalized weakness [R53.1] Frequent falls [R29.6] Hypothermia, initial encounter [T68.XXXA] Contusion of head, initial encounter [S00.93XA] Anemia, unspecified type [D64.9] Patient Active Problem List   Diagnosis Date Noted   Subdural hematoma (  HCC) 01/30/2024   Toxic metabolic encephalopathy 01/24/2016   Pressure ulcer 01/23/2016   Non-insulin  dependent type 2 diabetes mellitus (HCC)    Altered  mental status 01/22/2016   COPD (chronic obstructive pulmonary disease) (HCC) 01/22/2016   Thrombocytopenia (HCC) 01/22/2016   Acute encephalopathy 11/01/2015   Chronic daily headache 03/08/2015   Localization-related symptomatic 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, intractable, without status epilepticus (HCC) 03/07/2015   Memory loss 03/07/2015   Seizure disorder (HCC) 03/05/2014   Other and unspecified hyperlipidemia 03/05/2014   Essential hypertension, benign 03/05/2014   Diabetes (HCC) 03/05/2014   Depression 03/05/2014   Generalized anxiety disorder 03/05/2014   Hypothyroidism 03/05/2014   Poor dentition 03/05/2014   PCP:  Claudell Cruz, MD Pharmacy:   [facility] [place], [place] - [address] [facility] [address] [place] [address] Phone: [phone] Fax: [phone]     Social Drivers of Health (SDOH) Social History: SDOH Screenings   Food Insecurity: Patient Unable To Answer (02/02/2024)  Housing: Patient Unable To Answer (02/02/2024)  Transportation Needs: Patient Unable To Answer (02/02/2024)  Utilities: Patient Unable To Answer (02/02/2024)  Financial Resource Strain: Low Risk  (12/31/2022)   Received from [facility]  Physical Activity: Unknown (03/27/2023)   Received from [facility]  Social Connections: Somewhat Isolated (03/27/2023)   Received from [facility]  Stress: Stress Concern Present (03/27/2023)   Received from [facility]  Tobacco Use: High Risk (01/30/2024)   SDOH Interventions:     Readmission Risk Interventions     No data to display

## 2024-02-03 NOTE — Consult Note (Signed)
 NEUROLOGY CONSULT NOTE   Date of service: Feb 03, 2024 Patient Name: [name] MRN:  [id number] DOB:  Jul 18, 1961 Chief Complaint: "They say I fell" Requesting Provider: Cala Castleman, MD  History of Present Illness  [name] is a 63 y.o. male with hx of COPD, Anxiety,Hypothyroid, DM, HTN, HLD and seizure disorder who admitted in the setting of falls and found to have a R>L subdural hematomas with leftward midline shift. Neurology was consulted for further assessment.   Patient states a fall occurred Friday. Denies feelings of lightheadedness, dizzines, headache or loss of consciousness. He denies any recent EtOH or recreational substance use. Patient not certain if he had seizure prior to fall. Patient reports chronic left upper extremity weakness for the last 10 years.   He reports compliance with Depakote  500 mg twice daily. He believes he has had seizures x1 weekly over the past 3 months. Describes seizures as "shaking all over". At baseline needs a walker or rollator to walk.   ROS  Comprehensive ROS performed and pertinent positives documented in HPI  Unable to ascertain certain aspects due to altered mental status and  Past History   Past Medical History:  Diagnosis Date   Anemia    Depression    Diabetes mellitus without complication (HCC)    Emphysema of lung (HCC)    GERD (gastroesophageal reflux disease)    Hiatal hernia    Hyperlipidemia    Hypertension    Memory loss    Seizures (HCC)    Thyroid  disease     Past Surgical History:  Procedure Laterality Date   APPENDECTOMY     DENTAL SURGERY     EYE SURGERY     TONSILLECTOMY AND ADENOIDECTOMY      Family History: Family History  Problem Relation Age of Onset   Stroke Mother    Heart disease Father    Thyroid  disease Father    Thyroid  disease Brother     Social History  reports that he has been smoking e-cigarettes and cigarettes. He has never used smokeless tobacco. He reports that he does  not drink alcohol and does not use drugs.  Allergies  Allergen Reactions   Levetiracetam  Other (See Comments)    Severe anxiety, agitation, hyperactivity, very negative disposition   Tegretol [Carbamazepine] Anxiety and Other (See Comments)    Increased seizures/ Induces seizures   Penicillins Other (See Comments)    Reaction not noted   Topiramate Other (See Comments)    Reaction not noted    Medications   Current Facility-Administered Medications:    ALPRAZolam  (XANAX ) tablet 1 mg, 1 mg, Per Tube, BID, Harris, Whitney D, NP, 1 mg at 02/03/24 0901   ALPRAZolam  (XANAX ) tablet 1 mg, 1 mg, Oral, Once, Harris, Whitney D, NP   arformoterol  (BROVANA ) nebulizer solution 15 mcg, 15 mcg, Nebulization, BID, Shelli Dexter M, PA-C, 15 mcg at 02/03/24 4098   budesonide  (PULMICORT ) nebulizer solution 0.25 mg, 0.25 mg, Nebulization, BID, Alayne Hubert, Stephanie M, PA-C, 0.25 mg at 02/03/24 1191   Chlorhexidine  Gluconate Cloth 2 % PADS 6 each, 6 each, Topical, Daily, Alva, Rakesh V, MD, 6 each at 02/03/24 1245   citalopram  (CELEXA ) tablet 20 mg, 20 mg, Per Tube, Daily, Agarwala, Ravi, MD, 20 mg at 02/03/24 0902   docusate sodium  (COLACE) capsule 100 mg, 100 mg, Oral, BID PRN, Shelli Dexter M, PA-C   feeding supplement (OSMOLITE 1.5 CAL) liquid 1,000 mL, 1,000 mL, Per Tube, Continuous, Trevor Fudge, MD, Last Rate: 55 mL/hr  at 02/02/24 2044, 1,000 mL at 02/02/24 2044   feeding supplement (PROSource TF20) liquid 60 mL, 60 mL, Per Tube, BID, Trevor Fudge, MD, 60 mL at 02/03/24 0903   haloperidol lactate (HALDOL) injection 2 mg, 2 mg, Intramuscular, Q6H PRN, Singh, Prashant K, MD   hydrALAZINE (APRESOLINE) injection 10 mg, 10 mg, Intravenous, Q4H PRN, Ogan, Okoronkwo U, MD, 10 mg at 02/02/24 1953   hydrALAZINE (APRESOLINE) tablet 50 mg, 50 mg, Per Tube, Q8H, Singh, Prashant K, MD, 50 mg at 02/03/24 0902   insulin  aspart (novoLOG ) injection 0-9 Units, 0-9 Units, Subcutaneous, Q4H, Reese, Stephanie M, PA-C,  2 Units at 02/03/24 1243   labetalol (NORMODYNE) injection 10 mg, 10 mg, Intravenous, Q2H PRN, Paliwal, Aditya, MD, 10 mg at 02/01/24 2047   levothyroxine  (SYNTHROID ) tablet 150 mcg, 150 mcg, Per Tube, Q0600, Deneise Finlay D, NP, 150 mcg at 02/03/24 0515   multivitamin with minerals tablet 1 tablet, 1 tablet, Per Tube, Daily, Trevor Fudge, MD, 1 tablet at 02/03/24 0902   nicotine (NICODERM CQ - dosed in mg/24 hours) patch 14 mg, 14 mg, Transdermal, Daily, Ogan, Okoronkwo U, MD, 14 mg at 02/03/24 1610   Oral care mouth rinse, 15 mL, Mouth Rinse, 4 times per day, Trevor Fudge, MD, 15 mL at 02/03/24 1246   Oral care mouth rinse, 15 mL, Mouth Rinse, PRN, Chand, Sudham, MD, 15 mL at 02/02/24 0529   pantoprazole  (PROTONIX ) injection 40 mg, 40 mg, Intravenous, QHS, Reese, Stephanie M, PA-C, 40 mg at 02/02/24 2216   polyethylene glycol (MIRALAX  / GLYCOLAX ) packet 17 g, 17 g, Oral, Daily PRN, Alayne Hubert, Stephanie M, PA-C   revefenacin  (YUPELRI ) nebulizer solution 175 mcg, 175 mcg, Nebulization, Daily, Shelli Dexter M, PA-C, 175 mcg at 02/03/24 0810   thiamine (VITAMIN B1) tablet 100 mg, 100 mg, Per Tube, Daily, Chand, Sudham, MD, 100 mg at 02/03/24 9604   traMADol  (ULTRAM ) tablet 50 mg, 50 mg, Per Tube, Q6H PRN, Cala Castleman, MD   valproic acid  (DEPAKENE ) 250 MG/5ML solution 500 mg, 500 mg, Per Tube, BID, Harris, Whitney D, NP, 500 mg at 02/03/24 0913  Vitals   Vitals:   02/03/24 0400 02/03/24 0430 02/03/24 0725 02/03/24 1207  BP: 137/71   (!) 154/110  Pulse: (!) 115  (!) 103 (!) 110  Resp: 14  19 17   Temp: 98.8 F (37.1 C)  97.8 F (36.6 C) 98.7 F (37.1 C)  TempSrc: Axillary  Oral Oral  SpO2: 93%  95% 96%  Weight:  91.1 kg    Height:        Body mass index is 32.42 kg/m.  Physical Exam   Constitutional: Appears well-developed, well-nourished, NG tube in place, appears ill, somnolent   Psych: Affect appropriate to situation.  Eyes: No scleral injection.  HENT: No OP obstruction.   Head: Normocephalic.  Cardiovascular: Normal rate per tele Respiratory: Effort normal, non-labored breathing.  GI: Soft.  No distension. There is no tenderness.  Skin: WDI. Scabs throughout lower extremities, Left lower eyelid ecchymosis   Neurologic Examination   Neurological Exam: Variable effort given throughout the exam  Mental status/Cognition:  Somnolent, arousable to voice and touch, Alert to Name and city, disoriented to age "6", location and month Eyes shut majority of interview, able to open eye lids slightly R>L , Pt is able to follow simple verbal step commands. Pt has normal comprehension.   Speech/language: Patient does not have dysarthria, decreased volume, normal fluency  Cranial nerves:   CN II Pupils equal  and reactive to light   CN III,IV,VI no gaze preference or deviation    CN V normal sensation in V1, V2, and V3 segments bilaterally   CN VII no asymmetry, no nasolabial fold flattening   CN VIII Makes eye contact to speech.   CN IX & X normal palatal elevation, no uvular deviation   CN XI 5/5 shoulder shrug bilaterally   CN XII midline tongue protrusion    Soft restraints released to participate in physical exam.    Motor:  Muscle bulk: nml  Drift: No drift noted in lower extremities  Strength: Effort variable throughout the exam, limited range of motion in the left upper extremity, chronic issue per patient report last 10 years. Unable to left lower extremity against gravity.  No issues with range of motion and right upper or lower extremity, able to lift against gravity.   Sensation: Intact to light touch   Coordination/Complex Motor:  - Finger to Nose: deferred soft restraints in place - Heel to shin: RLE intact, without drift or ataxia - Gait: Deferred   Labs/Imaging/Neurodiagnostic studies   CBC:  Recent Labs  Lab Feb 05, 2024 0549 02/03/24 0445  WBC 14.7* 15.2*  HGB 10.0* 10.0*  HCT 27.8* 27.5*  MCV 86.6 87.9  PLT 144* 157   Basic  Metabolic Panel:  Lab Results  Component Value Date   NA 127 (L) 02/03/2024   K 3.8 02/03/2024   CO2 22 02/03/2024   GLUCOSE 156 (H) 02/03/2024   BUN 23 02/03/2024   CREATININE 0.64 02/03/2024   CALCIUM  8.8 (L) 02/03/2024   GFRNONAA >60 02/03/2024   GFRAA >60 04/19/2019   Lipid Panel: No results found for: "LDLCALC" HgbA1c:  Lab Results  Component Value Date   HGBA1C 6.1 (H) 01/31/2024   Urine Drug Screen:     Component Value Date/Time   LABOPIA NONE DETECTED 01/30/2024 2003   COCAINSCRNUR NONE DETECTED 01/30/2024 2003   LABBENZ POSITIVE (A) 01/30/2024 2003   AMPHETMU NONE DETECTED 01/30/2024 2003   THCU NONE DETECTED 01/30/2024 2003   LABBARB NONE DETECTED 01/30/2024 2003    Alcohol Level     Component Value Date/Time   [facility] <15 01/30/2024 2032   INR  Lab Results  Component Value Date   INR 1.0 01/31/2024   APTT No results found for: "APTT" AED levels:  Lab Results  Component Value Date   LEVETIRACETA <2.0 (L) 01/30/2024    CT Head without contrast(Personally reviewed): 5/8 1. Acute 9 mm thick right cerebral convexity subdural hemorrhage which extends along the right tentorial leaflet. Resulting 3 mm leftward midline shift. 2. Scattered small volume of acute subarachnoid hemorrhage. 3. No evidence of acute fracture or traumatic malalignment in the cervical spine.  5/9 1. Slightly increased size of right larger than left subdural hematomas. Unchanged mass effect and 3 mm of leftward midline shift. 2. Small volume subarachnoid hemorrhage with mildly increased hemorrhage in the left lateral ventricle, possibly redistribution.  Neurodiagnostics rEEG:  This study is suggestive of mild diffuse encephalopathy. No seizures or epileptiform discharges were seen throughout the recording   ASSESSMENT   [name] is a 63 y.o. male with hx of COPD, Anxiety,Hypothyroid, DM, HTN, HLD and seizure disorder who admitted in the setting of falls and found to have a  R>L subdural hematomas with leftward midline shift. Neurology was consulted for further assessment.   On initial assessment, patient is intermittently somnolent and disoriented to age and setting. Suspect somnolent is due to subdural hematomas noted on  CT. Routine EEG not suggestive of seizure-like activity and will continue with AEDs as ordered. Ammonia status was normal on intake labs, less suspicious that Depakote  is causing altered mental status.  Patient's TSH also suggestive of of hypothyroidism and could possibly contribute to falls at home, as potential cardiogenic source of syncope.  Patient also on chronic benzos for anxiety, which could contribute to falls.  RECOMMENDATIONS  - routine EEG, w/o seizures or epileptiform discharged, suggestive of mild diffuse encephalopathy - Repeat depakote  level, 105 - Continue Depakote  500 mg twice daily - Patient needs to continue to follow-up with neurologist outpatient for management of seizure medications     Please call back with questions as needed. ______________________________________________________________________  Signed, DONOVAN RAINEY, MD [name] Psychiatry Resident - PGY1   Attending Neurohospitalist Addendum Patient seen and examined with APP/Resident. Agree with the history and physical as documented above. Agree with the plan as documented, which I helped formulate. I have independently reviewed the chart, obtained history, review of systems and examined the patient.I have personally reviewed pertinent head/neck/spine imaging (CT/MRI).  Plan relayed to Dr. Zelda Hickman. Please feel free to call with any questions.  -- Tona Francis, MD Neurologist [facility] Pager: [phone]

## 2024-02-03 NOTE — Procedures (Signed)
 Patient Name: Arib Ulch  MRN: [id number]  Epilepsy Attending: Arleene Lack  Referring Physician/Provider: Brayton Calin, MD  Date: 02/03/2024 Duration: 23.26 mins  Patient history: 63yo M with ams. EEG to evaluate for seizure  Level of alertness: Awake, asleep  AEDs during EEG study: VPA, Xanax   Technical aspects: This EEG study was done with scalp electrodes positioned according to the 10-20 International system of electrode placement. Electrical activity was reviewed with band pass filter of 1-70Hz , sensitivity of 7 uV/mm, display speed of 23mm/sec with a 60Hz  notched filter applied as appropriate. EEG data were recorded continuously and digitally stored.  Video monitoring was available and reviewed as appropriate.  Description: The posterior dominant rhythm consists of 8 Hz activity of moderate voltage (25-35 uV) seen predominantly in posterior head regions, symmetric and reactive to eye opening and eye closing. Sleep was characterized by vertex waves, sleep spindles (12 to 14 Hz), maximal frontocentral region. EEG showed intermittent generalized 3 to 6 Hz theta-delta slowing. Hyperventilation and photic stimulation were not performed.     ABNORMALITY - Intermittent slow, generalized  IMPRESSION: This study is suggestive of mild diffuse encephalopathy. No seizures or epileptiform discharges were seen throughout the recording.  Ashleigh Luckow O Safiyyah Vasconez

## 2024-02-03 NOTE — NC FL2 (Signed)
 [place]  MEDICAID FL2 LEVEL OF CARE FORM     IDENTIFICATION  Patient Name: [name] Birthdate: 09/25/1960 Sex: male Admission Date (Current Location): 01/30/2024  [facility] and [place] Number:  Producer, television/film/video and Address:  [facility], [address] [place], [place] [address]      Provider Number: [id number]  Attending Physician Name and Address:  Cala Castleman, MD  Relative Name and Phone Number:  Red Holiday; Spouse; [phone]    Current Level of Care: Hospital Recommended Level of Care: Skilled Nursing Facility Prior Approval Number:    Date Approved/Denied:   PASRR Number:    Discharge Plan: SNF    Current Diagnoses: Patient Active Problem List   Diagnosis Date Noted   Subdural hematoma (HCC) 01/30/2024   Toxic metabolic encephalopathy 01/24/2016   Pressure ulcer 01/23/2016   Non-insulin  dependent type 2 diabetes mellitus (HCC)    Altered mental status 01/22/2016   COPD (chronic obstructive pulmonary disease) (HCC) 01/22/2016   Thrombocytopenia (HCC) 01/22/2016   Acute encephalopathy 11/01/2015   Chronic daily headache 03/08/2015   Localization-related symptomatic 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, intractable, without status epilepticus (HCC) 03/07/2015   Memory loss 03/07/2015   Seizure disorder (HCC) 03/05/2014   Other and unspecified hyperlipidemia 03/05/2014   Essential hypertension, benign 03/05/2014   Diabetes (HCC) 03/05/2014   Depression 03/05/2014   Generalized anxiety disorder 03/05/2014   Hypothyroidism 03/05/2014   Poor dentition 03/05/2014    Orientation RESPIRATION BLADDER Height & Weight     Self, Place  Normal (Room Air) Incontinent, External catheter Weight: 200 lb 13.4 oz (91.1 kg) Height:  5\' 6"  (167.6 cm)  BEHAVIORAL SYMPTOMS/MOOD NEUROLOGICAL BOWEL NUTRITION STATUS    Convulsions/Seizures (Seizure Disorder (HCC)) Incontinent Diet (Please see discharge  summary)  AMBULATORY STATUS COMMUNICATION OF NEEDS Skin   Extensive Assist Verbally PU Stage and Appropriate Care (Pressure Injury 01/31/24 Coccyx Mid Deep Tissue Pressure Injury - Purple or maroon localized area of discolored intact skin or blood-filled blister due to damage of underlying soft tissue from pressure and/or shear.)                       Personal Care Assistance Level of Assistance  Bathing, Feeding, Dressing Bathing Assistance: Maximum assistance Feeding assistance: Maximum assistance Dressing Assistance: Maximum assistance     Functional Limitations Info  Sight Sight Info: Impaired (L (Edema))        SPECIAL CARE FACTORS FREQUENCY  PT (By licensed PT), OT (By licensed OT)     PT Frequency: 5x OT Frequency: 5x            Contractures Contractures Info: Not present    Additional Factors Info  Code Status, Allergies, Insulin  Sliding Scale Code Status Info: Full Code Allergies Info: Levetiracetam ; Tegretol (carbamazepine); Penicillins; Topiramate   Insulin  Sliding Scale Info: Please see discharge summary       Current Medications (02/03/2024):  This is the current hospital active medication list Current Facility-Administered Medications  Medication Dose Route Frequency Provider Last Rate Last Admin   ALPRAZolam  (XANAX ) tablet 1 mg  1 mg Per Tube BID Harris, Whitney D, NP   1 mg at 02/03/24 0901   ALPRAZolam  (XANAX ) tablet 1 mg  1 mg Oral Once Deneise Finlay D, NP       arformoterol  (BROVANA ) nebulizer solution 15 mcg  15 mcg Nebulization BID Shelli Dexter M, PA-C   15 mcg at 02/03/24 0810  budesonide  (PULMICORT ) nebulizer solution 0.25 mg  0.25 mg Nebulization BID Shelli Dexter M, PA-C   0.25 mg at 02/03/24 1914   Chlorhexidine  Gluconate Cloth 2 % PADS 6 each  6 each Topical Daily Lind Repine, MD   6 each at 02/02/24 1045   citalopram  (CELEXA ) tablet 20 mg  20 mg Per Tube Daily Agarwala, Martha Slack, MD   20 mg at 02/03/24 0902   docusate sodium   (COLACE) capsule 100 mg  100 mg Oral BID PRN [place], PA-C       feeding supplement (OSMOLITE 1.5 CAL) liquid 1,000 mL  1,000 mL Per Tube Continuous Chand, Sudham, MD 55 mL/hr at 02/02/24 2044 1,000 mL at 02/02/24 2044   feeding supplement (PROSource TF20) liquid 60 mL  60 mL Per Tube BID Chand, Sudham, MD   60 mL at 02/03/24 0903   haloperidol lactate (HALDOL) injection 2 mg  2 mg Intramuscular Q6H PRN Singh, Prashant K, MD       hydrALAZINE (APRESOLINE) injection 10 mg  10 mg Intravenous Q4H PRN Ogan, Okoronkwo U, MD   10 mg at 02/02/24 1953   hydrALAZINE (APRESOLINE) tablet 50 mg  50 mg Per Tube Q8H Singh, Prashant K, MD   50 mg at 02/03/24 0902   insulin  aspart (novoLOG ) injection 0-9 Units  0-9 Units Subcutaneous Q4H Shelli Dexter M, PA-C   2 Units at 02/03/24 0912   labetalol (NORMODYNE) injection 10 mg  10 mg Intravenous Q2H PRN Paliwal, Zenda Highman, MD   10 mg at 02/01/24 2047   levothyroxine  (SYNTHROID ) tablet 150 mcg  150 mcg Per Tube Q0600 Deneise Finlay D, NP   150 mcg at 02/03/24 0515   multivitamin with minerals tablet 1 tablet  1 tablet Per Tube Daily Chand, Sudham, MD   1 tablet at 02/03/24 0902   nicotine (NICODERM CQ - dosed in mg/24 hours) patch 14 mg  14 mg Transdermal Daily Ogan, Okoronkwo U, MD   14 mg at 02/03/24 7829   Oral care mouth rinse  15 mL Mouth Rinse 4 times per day Chand, Sudham, MD   15 mL at 02/02/24 2216   Oral care mouth rinse  15 mL Mouth Rinse PRN Chand, Sudham, MD   15 mL at 02/02/24 0529   pantoprazole  (PROTONIX ) injection 40 mg  40 mg Intravenous QHS Shelli Dexter M, PA-C   40 mg at 02/02/24 2216   polyethylene glycol (MIRALAX  / GLYCOLAX ) packet 17 g  17 g Oral Daily PRN Shelli Dexter M, PA-C       revefenacin  (YUPELRI ) nebulizer solution 175 mcg  175 mcg Nebulization Daily Shelli Dexter M, PA-C   175 mcg at 02/03/24 0810   thiamine (VITAMIN B1) tablet 100 mg  100 mg Per Tube Daily Chand, Sudham, MD   100 mg at 02/03/24 5621   traMADol   (ULTRAM ) tablet 50 mg  50 mg Per Tube Q6H PRN Singh, Prashant K, MD       valproic acid  (DEPAKENE ) 250 MG/5ML solution 500 mg  500 mg Per Tube BID Harris, Whitney D, NP   500 mg at 02/03/24 3086     Discharge Medications: Please see discharge summary for a list of discharge medications.  Relevant Imaging Results:  Relevant Lab Results:   Additional Information SS# [id number]  Juliane Och, LCSW

## 2024-02-03 NOTE — Plan of Care (Signed)
  Problem: Health Behavior/Discharge Planning: Goal: Ability to manage health-related needs will improve Outcome: Progressing   Problem: Nutritional: Goal: Maintenance of adequate nutrition will improve Outcome: Progressing   Problem: Health Behavior/Discharge Planning: Goal: Ability to manage health-related needs will improve Outcome: Progressing   Problem: Clinical Measurements: Goal: Will remain free from infection Outcome: Progressing Goal: Diagnostic test results will improve Outcome: Progressing   Problem: Activity: Goal: Risk for activity intolerance will decrease Outcome: Progressing

## 2024-02-03 NOTE — Progress Notes (Signed)
 PT Cancellation Note  Patient Details Name: [name] MRN: [id number] DOB: 28-Nov-1960   Cancelled Treatment:    Reason Eval/Treat Not Completed: Patient at procedure or test/unavailable, currently getting set up with LTM EEG. Will follow up for PT treatment as schedule permits.  [name], PT, DPT Acute Rehabilitation Services  Personal: Secure Chat Rehab Office: [phone]  [name] 02/03/2024, 10:57 AM

## 2024-02-03 NOTE — Plan of Care (Signed)
  Problem: Skin Integrity: Goal: Risk for impaired skin integrity will decrease Outcome: Progressing   Problem: Tissue Perfusion: Goal: Adequacy of tissue perfusion will improve Outcome: Progressing   

## 2024-02-03 NOTE — TOC CM/SW Note (Addendum)
 RE: [name] Date of Birth: Oct 26, 1960 Date: 02/03/2024  Please be advised that the above-named patient will require a short-term nursing home stay - anticipated 30 days or less for rehabilitation and strengthening.  The plan is for return home.  Hershal Loron, MSW, LCSW-A Transitions of Care  Clinical Social Worker I [phone]

## 2024-02-04 ENCOUNTER — Inpatient Hospital Stay (HOSPITAL_COMMUNITY): Payer: Medicare (Managed Care)

## 2024-02-04 DIAGNOSIS — S065XAA Traumatic subdural hemorrhage with loss of consciousness status unknown, initial encounter: Secondary | ICD-10-CM | POA: Diagnosis not present

## 2024-02-04 DIAGNOSIS — R296 Repeated falls: Secondary | ICD-10-CM | POA: Diagnosis not present

## 2024-02-04 DIAGNOSIS — R569 Unspecified convulsions: Secondary | ICD-10-CM | POA: Diagnosis not present

## 2024-02-04 LAB — COMPREHENSIVE METABOLIC PANEL WITH GFR
ALT: 32 U/L (ref 0–44)
AST: 36 U/L (ref 15–41)
Albumin: 3.5 g/dL (ref 3.5–5.0)
Alkaline Phosphatase: 47 U/L (ref 38–126)
Anion gap: 11 (ref 5–15)
BUN: 22 mg/dL (ref 8–23)
CO2: 26 mmol/L (ref 22–32)
Calcium: 9.2 mg/dL (ref 8.9–10.3)
Chloride: 91 mmol/L — ABNORMAL LOW (ref 98–111)
Creatinine, Ser: 0.65 mg/dL (ref 0.61–1.24)
GFR, Estimated: >60 mL/min (ref >60)
Glucose, Bld: 144 mg/dL — ABNORMAL HIGH (ref 70–99)
Potassium: 3.3 mmol/L — ABNORMAL LOW (ref 3.5–5.1)
Sodium: 128 mmol/L — ABNORMAL LOW (ref 135–145)
Total Bilirubin: 0.9 mg/dL (ref 0.0–1.2)
Total Protein: 7.2 g/dL (ref 6.5–8.1)

## 2024-02-04 LAB — URINALYSIS, ROUTINE W REFLEX MICROSCOPIC
Bilirubin Urine: NEGATIVE
Glucose, UA: >=500 mg/dL — AB
Ketones, ur: 5 mg/dL — AB
Nitrite: NEGATIVE
Protein, ur: 100 mg/dL — AB
Specific Gravity, Urine: 1.017 (ref 1.005–1.030)
WBC, UA: >50 WBC/hpf (ref 0–5)
pH: 7 (ref 5.0–8.0)

## 2024-02-04 LAB — GLUCOSE, CAPILLARY
Glucose-Capillary: 159 mg/dL — ABNORMAL HIGH (ref 70–99)
Glucose-Capillary: 160 mg/dL — ABNORMAL HIGH (ref 70–99)
Glucose-Capillary: 167 mg/dL — ABNORMAL HIGH (ref 70–99)
Glucose-Capillary: 171 mg/dL — ABNORMAL HIGH (ref 70–99)
Glucose-Capillary: 181 mg/dL — ABNORMAL HIGH (ref 70–99)
Glucose-Capillary: 187 mg/dL — ABNORMAL HIGH (ref 70–99)

## 2024-02-04 LAB — CBC WITH DIFFERENTIAL/PLATELET
Abs Immature Granulocytes: 0.11 10*3/uL — ABNORMAL HIGH (ref 0.00–0.07)
Basophils Absolute: 0 10*3/uL (ref 0.0–0.1)
Basophils Relative: 0 %
Eosinophils Absolute: 0 10*3/uL (ref 0.0–0.5)
Eosinophils Relative: 0 %
HCT: 29.7 % — ABNORMAL LOW (ref 39.0–52.0)
Hemoglobin: 10.6 g/dL — ABNORMAL LOW (ref 13.0–17.0)
Immature Granulocytes: 1 %
Lymphocytes Relative: 20 %
Lymphs Abs: 3.2 10*3/uL (ref 0.7–4.0)
MCH: 31 pg (ref 26.0–34.0)
MCHC: 35.7 g/dL (ref 30.0–36.0)
MCV: 86.8 fL (ref 80.0–100.0)
Monocytes Absolute: 2.5 10*3/uL — ABNORMAL HIGH (ref 0.1–1.0)
Monocytes Relative: 15 %
Neutro Abs: 10.7 10*3/uL — ABNORMAL HIGH (ref 1.7–7.7)
Neutrophils Relative %: 64 %
Platelets: 162 10*3/uL (ref 150–400)
RBC: 3.42 MIL/uL — ABNORMAL LOW (ref 4.22–5.81)
RDW: 15.3 % (ref 11.5–15.5)
WBC: 16.5 10*3/uL — ABNORMAL HIGH (ref 4.0–10.5)
nRBC: 0.2 % (ref 0.0–0.2)

## 2024-02-04 LAB — CULTURE, BLOOD (ROUTINE X 2)
Culture: NO GROWTH
Culture: NO GROWTH
Special Requests: ADEQUATE

## 2024-02-04 LAB — C-REACTIVE PROTEIN: CRP: 1.7 mg/dL — ABNORMAL HIGH (ref <1.0)

## 2024-02-04 LAB — URIC ACID: Uric Acid, Serum: <1.5 mg/dL — ABNORMAL LOW (ref 3.7–8.6)

## 2024-02-04 LAB — SODIUM, URINE, RANDOM: Sodium, Ur: 43 mmol/L

## 2024-02-04 LAB — PROCALCITONIN: Procalcitonin: 0.14 ng/mL

## 2024-02-04 LAB — OSMOLALITY, URINE: Osmolality, Ur: 654 mosm/kg (ref 300–900)

## 2024-02-04 LAB — MAGNESIUM: Magnesium: 2.1 mg/dL (ref 1.7–2.4)

## 2024-02-04 LAB — CREATININE, URINE, RANDOM: Creatinine, Urine: 100 mg/dL

## 2024-02-04 LAB — OSMOLALITY: Osmolality: 277 mosm/kg (ref 275–295)

## 2024-02-04 LAB — UREA NITROGEN, URINE: Urea Nitrogen, Ur: 750 mg/dL

## 2024-02-04 LAB — PHOSPHORUS: Phosphorus: 3.1 mg/dL (ref 2.5–4.6)

## 2024-02-04 MED ORDER — SODIUM CHLORIDE 0.9 % IV SOLN
100.0000 mg | Freq: Two times a day (BID) | INTRAVENOUS | Status: DC
  Filled 2024-02-04 (×2): qty 10

## 2024-02-04 MED ORDER — SODIUM CHLORIDE 0.9 % IV SOLN
200.0000 mg | Freq: Once | INTRAVENOUS | Status: AC
  Administered 2024-02-05: 200 mg via INTRAVENOUS
  Filled 2024-02-04: qty 20

## 2024-02-04 MED ORDER — ALPRAZOLAM 0.5 MG PO TABS
0.5000 mg | ORAL_TABLET | Freq: Two times a day (BID) | ORAL | Status: DC
  Administered 2024-02-04: 0.5 mg
  Filled 2024-02-04: qty 1

## 2024-02-04 MED ORDER — SODIUM CHLORIDE 1 G PO TABS
1.0000 g | ORAL_TABLET | Freq: Two times a day (BID) | ORAL | Status: DC
Start: 2024-02-04 — End: 2024-02-05
  Administered 2024-02-04 (×2): 1 g via NASOGASTRIC
  Filled 2024-02-04 (×2): qty 1

## 2024-02-04 MED ORDER — SODIUM CHLORIDE 0.9 % IV SOLN
50.0000 mg | Freq: Two times a day (BID) | INTRAVENOUS | Status: DC
  Filled 2024-02-04: qty 5

## 2024-02-04 MED ORDER — POTASSIUM CHLORIDE CRYS ER 20 MEQ PO TBCR
40.0000 meq | EXTENDED_RELEASE_TABLET | Freq: Once | ORAL | Status: AC
  Administered 2024-02-04: 40 meq via ORAL
  Filled 2024-02-04: qty 2

## 2024-02-04 MED ORDER — SODIUM CHLORIDE 0.9 % IV SOLN
1.0000 g | INTRAVENOUS | Status: AC
  Administered 2024-02-04 – 2024-02-08 (×5): 1 g via INTRAVENOUS
  Filled 2024-02-04 (×5): qty 10

## 2024-02-04 MED ORDER — SODIUM CHLORIDE 0.9 % IV SOLN
200.0000 mg | INTRAVENOUS | Status: AC
  Administered 2024-02-04: 200 mg via INTRAVENOUS
  Filled 2024-02-04: qty 20

## 2024-02-04 NOTE — Procedures (Signed)
 Patient Name: [name]  MRN: [id number]  Epilepsy Attending: Arleene Lack  Referring Physician/Provider: Cala Castleman, MD  Date : 02/04/2024 Duration: 22.53 mins  Patient history: 63yo M with right sub dural hematoma. EEG to evaluate for seizure  Level of alertness: Awake  AEDs during EEG study: VPA, Xanax   Technical aspects: This EEG study was done with scalp electrodes positioned according to the 10-20 International system of electrode placement. Electrical activity was reviewed with band pass filter of 1-70Hz , sensitivity of 7 uV/mm, display speed of 60mm/sec with a 60Hz  notched filter applied as appropriate. EEG data were recorded continuously and digitally stored.  Video monitoring was available and reviewed as appropriate.  Description: EEG showed continuous generalized and lateralized right hemisphere 3 to 7 Hz theta-delta slowing. Rhythmic sharply contoured 3-4hz  theta-delta slowing was noted in right hemisphere with waxing and waning morphology. Hyperventilation and photic stimulation were not performed.     ABNORMALITY - Lateralized rhythmic delta slowing, right hemisphere ( LRDA) - Continuous slow, generalized and lateralized right hemisphere  IMPRESSION: This study is suggestive of cortical dysfunction arising from right hemisphere likely secondary to underlying structural abnormality/ subdural hematoma. This EEG pattern is on the ictal-interictal continuum with higher potential for seizure recurrence. Consider long term eeg monitoring if concern for ictal-interictal activity persists.   Additionally there is moderate diffuse encephalopathy. No definite seizures were seen throughout the recording.   Basem Yannuzzi O Jeris Roser

## 2024-02-04 NOTE — Progress Notes (Signed)
 EEG shows increasing lateralized discharges which at times are sharply contoured, but without definite evolution.  I do think would be reasonable to increase antiepileptics in response to this plan, and we will continue to monitor.  Lacosamide 200 mg x 1, increase scheduled dose to 100 mg twice daily.  Ann Keto, MD [facility]   If 7pm- 7am, please page neurology on call as listed in AMION.

## 2024-02-04 NOTE — Progress Notes (Signed)
 PT Cancellation Note  Patient Details Name: Bronco Stem MRN: [id number] DOB: 1961/06/13   Cancelled Treatment:    Reason Eval/Treat Not Completed: (P) Medical issues which prohibited therapy (pt being hooked up to LTM monitoring, not yet medically appropriate for participation in OOB mobility.)   Arville Laughter 02/04/2024, 6:09 PM

## 2024-02-04 NOTE — Progress Notes (Addendum)
 PROGRESS NOTE                                                                                                                                                                                                             Patient Demographics:    [name], is a 63 y.o. male, DOB - September 15, 1961, ZOX:[id number]  Outpatient Primary MD for the patient is Leamon Pringle, Moises Ang, MD    LOS - 5  Admit date - 01/30/2024    Chief Complaint  Patient presents with   Fall   Altered Mental Status       Brief Narrative (HPI from H&P)   63 year old man who was brought in by EMS from home after a fall.  Apparently wife called, patient is currently ANO x 1 and history obtained after chart review, unable to reach wife.  His heart rate initially was in the 80s, A/O x 1 per EMS, bradycardic to 40s in the ED , noted to be hypothermic 94.  Bruises of multiple ages left hip left eye and left shoulder Labs significant for no leukocytosis, hemoglobin 9.3, mild thrombocytopenia 1 27K, mild hypokalemia 3.3, hypomagnesemia 1.5, normal LFTs.  UDS positive for benzodiazepines, tox screen negative, ammonia 19, TSH 48.  EKG showed sinus bradycardia.  He was given 1 g of magnesium , 100 mg of IV Solu-Cortef  and 200 mics of levothyroxine  IV. Head CT showed 9 mm right subdural hematoma with 3 mm midline shift and small volume acute SAH, cervical spine was negative.  He was admitted to ICU by pulmonary critical care, seen by neurosurgeon Dr. Michale Age, stabilized and transferred to my care on 02/03/2024 on day 4 of his hospital stay, still little confused with NG tube in.  Significant Hospital Events: Including procedures, antibiotic start and stop dates in addition to other pertinent events   5/8 Admitted 5/9 No overnight issues, patient remained confused and restless, trying to get out of bed 5/10 ongoing issues with agitation overnight 5/11 mentation improved this a.m.  with less impulsivity 02/03/2024 transferred to [facility]   Subjective:   Appears to be in no distress but slightly more drowsy this morning, denies any chest or abdominal pain, does have some generalized headache, no shortness of breath or cough   Assessment  & Plan :    Acute right subdural hematoma with 3 mm midline  shift - Acute traumatic subarachnoid hemorrhage status post fall - Acute encephalopathy in the setting of right subdural hematoma -Repeat head CT 5/9 with slightly increased size of right larger than left subdural hematomas with 3 mm leftward midline shift, neurosurgery on board, appreciate their input.  Currently on Depakote , trying to keep systolic blood pressure under 161.  Continue seizure and aspiration precautions, monitor clinically.  EEG from 02/03/2024 shows no seizures. Has an NG tube and dysphagia, speech therapy following.  He is slightly more drowsy morning of 02/04/2024, repeat CT scan on 02/04/2024 done as he was slightly drowsy, mild worsening on the right sided SDH, discussed with Dr. Michale Age neurosurgery in detail no interventions needed.  Will repeat EEG on 02/04/2024, neurology and neurosurgery both on board.  Continue supportive care.   Underlying history of seizure disorder.  Had a fall at home causing subdural hematoma, currently on prophylactic Keppra , will get input from neurology as to long-term management of his seizures.  Was on Depakote  at home, question compliance, Keppra  levels were satisfactory upon admission, EEG on 02/03/2024 nonacute, repeating on 02/04/2024 as mentation is slightly poor.   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  Unseld on compliance.   Bradycardia/hypothermia due to hypothyroidism - resolved - With initiation of IV Synthroid  bradycardia has now resolved, continue to monitor on  telemetry.  Echo stable.   Anxiety disorder on chronic benzodiazepine -It appears patient is on high-dose benzodiazepines upwards of 4 mg of Xanax  nightly P: Continue reduced dosed home Xanax  1 mg twice daily Monitor for signs of withdrawal   COPD, not in exacerbation Tobacco dependence P: Aspiration precautions Continue home Brovana  and Yupelri  Smoking cessation education when appropriate   Hyponatremia  - Sodium level 132 on admission with trending after he received IV fluids in the ICU, clinically appears euvolemic, with head injury and dural bleed suspicious for SIADH, is on fluid restriction with trail of Lasix  on 02/03/2024, marginal improvement, requested nephrology to evaluate the patient, reviewed by Dr. Edson Graces who recommends continued fluid restriction, salt tablets twice daily via NG tube and monitor, if no improvement then formally consult.  Urine and serum electrolytes repeated.  Possible UTI.  Trial of Rocephin x 5.    Prediabetes P: Continue SSI CBG goal 140-180 CBG checks every 4  Lab Results  Component Value Date   HGBA1C 6.1 (H) 01/31/2024   CBG (last 3)  Recent Labs    02/03/24 2328 02/04/24 0601 02/04/24 0813  GLUCAP 199* 187* 171*          Condition -  Guarded  Family Communication  :   Wife [name] [phone] on 02/03/24  Message left 8:30 AM.  Updated her in detail on 02/04/2024  Daughter Prentice Brochure [phone]  on 02/03/2024  Code Status : Full code  Consults  : PCCM, neurosurgery Dr. Michale Age, neurology, nephrology  PUD Prophylaxis : PPI   Procedures  :     EEG 02/03/2024.  No seizures.     CT head 02/04/2024.  Noted discussed with Dr. Michale Age neurosurgery.  No interventions needed.    1. Largely resolved left side but Increased Right Side Subdural Hematoma since 01/31/2024. Right SDH now up to 11 mm with increased leftward midline shift, (now 4-5 mm) and increased mass effect on the right lateral ventricle. 2. Small volume SAH now  apparent. Stable small volume  IVH. No acute ventriculomegaly. Possible small superimposed right posterior Emma sphere hemorrhagic contusion. 3. No skull fracture identified.   CT 5/9 -  1. Slightly increased size of right larger than left subdural hematomas. Unchanged mass effect and 3 mm of leftward midline shift. 2. Small volume subarachnoid hemorrhage with mildly increased hemorrhage in the left lateral ventricle, possibly redistribution.  CT 5/8 -   1. Acute 9 mm thick right cerebral convexity subdural hemorrhage which extends along the right tentorial leaflet. Resulting 3 mm leftward midline shift. 2. Scattered small volume of acute subarachnoid hemorrhage. 3. No evidence of acute fracture or traumatic malalignment in the cervical spine.  TTE - 1. Left ventricular ejection fraction, by estimation, is 60 to 65% . The left ventricle has normal function. The left ventricle has no regional wall motion abnormalities. Left ventricular diastolic parameters are consistent with Grade I diastolic dysfunction ( impaired relaxation) . 2. Right ventricular systolic function is normal. The right ventricular size is normal. 3. The mitral valve is normal in structure. No evidence of mitral valve regurgitation. No evidence of mitral stenosis. 4. The aortic valve was not well visualized. Aortic valve regurgitation is mild. No aortic stenosis is present. 5. The inferior vena cava is normal in size with greater than 50% respiratory variability, suggesting right atrial pressure of 3 mmHg.      Disposition Plan  :    Status is: Inpatient  DVT Prophylaxis  :    SCDs Start: 01/30/24 2330   Lab Results  Component Value Date   PLT 162 02/04/2024    Diet :  Diet Order             Diet NPO time specified  Diet effective now                    Inpatient Medications  Scheduled Meds:  ALPRAZolam   0.5 mg Per Tube BID   arformoterol   15 mcg Nebulization BID   budesonide  (PULMICORT ) nebulizer  solution  0.25 mg Nebulization BID   Chlorhexidine  Gluconate Cloth  6 each Topical Daily   feeding supplement (PROSource TF20)  60 mL Per Tube BID   hydrALAZINE  50 mg Per Tube Q8H   insulin  aspart  0-9 Units Subcutaneous Q4H   levothyroxine   150 mcg Per Tube Q0600   multivitamin with minerals  1 tablet Per Tube Daily   nicotine  14 mg Transdermal Daily   mouth rinse  15 mL Mouth Rinse 4 times per day   pantoprazole  (PROTONIX ) IV  40 mg Intravenous QHS   revefenacin   175 mcg Nebulization Daily   thiamine  100 mg Per Tube Daily   valproic acid   500 mg Per Tube BID   Continuous Infusions:  cefTRIAXone (ROCEPHIN)  IV     feeding supplement (OSMOLITE 1.5 CAL) 55 mL/hr at 02/04/24 0547   PRN Meds:.docusate sodium , haloperidol lactate, hydrALAZINE, labetalol, mouth rinse, polyethylene glycol, traMADol   Antibiotics  :    Anti-infectives (From admission, onward)    Start     Dose/Rate Route Frequency Ordered Stop   02/04/24 1030  cefTRIAXone (ROCEPHIN) 1 g in sodium chloride  0.9 % 100 mL IVPB        1 g 200 mL/hr over 30 Minutes Intravenous Every 24 hours 02/04/24 0937 02/09/24 1029         Objective:   Vitals:   02/03/24 2111 02/04/24 0000 02/04/24 0400 02/04/24 0809  BP:  (!) 157/83 (!) 149/84 (!) 156/88  Pulse:  100 (!) 105  Resp:  13 14 14   Temp:  98.4 F (36.9 C)  98.1 F (36.7 C)  TempSrc:  Oral  Oral  SpO2: 98%  96% 95%  Weight:      Height:        Wt Readings from Last 3 Encounters:  02/03/24 91.1 kg  09/06/23 94.8 kg  07/11/22 103.4 kg     Intake/Output Summary (Last 24 hours) at 02/04/2024 0938 Last data filed at 02/04/2024 0700 Gross per 24 hour  Intake 1120.17 ml  Output 1450 ml  Net -329.83 ml     Physical Exam  Alert, oriented x 2, arousable, No new F.N deficits, NG tube in place [place].AT,PERRAL Supple Neck, No JVD,   Symmetrical Chest wall movement, Good air movement bilaterally, CTAB RRR,No Gallops,Rubs or new Murmurs,  +ve B.Sounds, Abd  Soft, No tenderness,   No Cyanosis, Clubbing or edema     RN pressure injury documentation: Pressure Injury 01/31/24 Coccyx Mid Deep Tissue Pressure Injury - Purple or maroon localized area of discolored intact skin or blood-filled blister due to damage of underlying soft tissue from pressure and/or shear. (Active)  01/31/24 0100  Location: Coccyx  Location Orientation: Mid  Staging: Deep Tissue Pressure Injury - Purple or maroon localized area of discolored intact skin or blood-filled blister due to damage of underlying soft tissue from pressure and/or shear.  Wound Description (Comments):   Present on Admission: Yes  Dressing Type Foam - Lift dressing to assess site every shift 02/03/24 1955      Data Review:    Recent Labs  Lab 01/31/24 0528 02/01/24 0644 02/02/24 0549 02/03/24 0445 02/04/24 0436  WBC 9.1 13.2* 14.7* 15.2* 16.5*  HGB 10.7* 10.0* 10.0* 10.0* 10.6*  HCT 30.3* 27.8* 27.8* 27.5* 29.7*  PLT 147* 147* 144* 157 162  MCV 86.8 85.8 86.6 87.9 86.8  MCH 30.7 30.9 31.2 31.9 31.0  MCHC 35.3 36.0 36.0 36.4* 35.7  RDW 14.6 14.3 14.6 15.7* 15.3  LYMPHSABS  --   --   --   --  3.2  MONOABS  --   --   --   --  2.5*  EOSABS  --   --   --   --  0.0  BASOSABS  --   --   --   --  0.0    Recent Labs  Lab 01/30/24 1952 01/30/24 1952 01/30/24 2020 01/30/24 2032 01/31/24 0528 02/01/24 0644 02/01/24 1351 02/01/24 2105 02/02/24 0148 02/02/24 0549 02/03/24 0445 02/03/24 0837 02/04/24 0436  NA 132*  --   --   --  127* 126*   < > 123* 124* 126* 127*  --  128*  K 3.3*  --   --   --  3.3* 3.0*  --   --   --  3.7 3.8  --  3.3*  CL 96*  --   --   --  88* 89*  --   --   --  92* 93*  --  91*  CO2 26  --   --   --  26 24  --   --   --  24 22  --  26  ANIONGAP 10  --   --   --  13 13  --   --   --  10 12  --  11  GLUCOSE 134*  --   --   --  115* 189*  --   --   --  153* 156*  --  144*  BUN 20  --   --   --  14 22  --   --   --  23 23  --  22  CREATININE 1.03  --   --   --   0.88 0.74  --   --   --  0.58* 0.64  --  0.65  AST 22  --   --   --  24  --   --   --   --   --   --   --  36  ALT 11  --   --   --  14  --   --   --   --   --   --   --  32  ALKPHOS 34*  --   --   --  44  --   --   --   --   --   --   --  47  BILITOT 1.2  --   --   --  1.3*  --   --   --   --   --   --   --  0.9  ALBUMIN 3.4*  --   --   --  4.0  --   --   --   --   --   --   --  3.5  CRP  --   --   --   --   --   --   --   --   --   --   --  1.0* 1.7*  PROCALCITON  --   --   --   --   --   --   --   --   --   --   --  0.38 0.14  LATICACIDVEN  --   --  2.1*  --   --   --   --   --   --   --   --   --   --   INR  --   --   --  1.1 1.0  --   --   --   --   --   --   --   --   TSH  --   --   --  48.029*  --   --   --   --   --   --  30.797*  --   --   HGBA1C  --   --   --   --  6.1*  --   --   --   --   --   --   --   --   AMMONIA  --   --   --  19  --   --   --   --   --   --   --   --   --   BNP  --   --   --   --   --   --   --   --   --   --  177.1*  --   --   MG  --    < >  --  1.5* 1.6* 2.2  --   --   --  2.0 2.1  --  2.1  PHOS  --   --   --   --  3.0 2.2*  --   --   --  2.1*  2.7  --  3.1  CALCIUM  8.5*  --   --   --  9.0 9.0  --   --   --  9.0 8.8*  --  9.2   < > = values in this interval not displayed.      Recent Labs  Lab 01/30/24 1952 01/30/24 2020 01/30/24 2032 01/31/24 0528 02/01/24 1610 02/02/24 0549 02/03/24 0445 02/03/24 0837 02/04/24 0436  CRP  --   --   --   --   --   --   --  1.0* 1.7*  PROCALCITON  --   --   --   --   --   --   --  0.38 0.14  LATICACIDVEN  --  2.1*  --   --   --   --   --   --   --   INR  --   --  1.1 1.0  --   --   --   --   --   TSH  --   --  48.029*  --   --   --  30.797*  --   --   HGBA1C  --   --   --  6.1*  --   --   --   --   --   AMMONIA  --   --  19  --   --   --   --   --   --   BNP  --   --   --   --   --   --  177.1*  --   --   MG   < >  --  1.5* 1.6* 2.2 2.0 2.1  --  2.1  CALCIUM   --   --   --  9.0 9.0 9.0 8.8*  --  9.2   < > =  values in this interval not displayed.    --------------------------------------------------------------------------------------------------------------- No results found for: "CHOL", "HDL", "LDLCALC", "LDLDIRECT", "TRIG", "CHOLHDL"  Lab Results  Component Value Date   HGBA1C 6.1 (H) 01/31/2024   Recent Labs    02/03/24 0445  TSH 30.797*  FREET4 0.31*      Micro Results Recent Results (from the past 240 hours)  Resp panel by RT-PCR (RSV, Flu A&B, Covid) Anterior Nasal Swab     Status: None   Collection Time: 01/30/24  8:10 PM   Specimen: Anterior Nasal Swab  Result Value Ref Range Status   SARS Coronavirus 2 by RT PCR NEGATIVE NEGATIVE Final   Influenza A by PCR NEGATIVE NEGATIVE Final   Influenza B by PCR NEGATIVE NEGATIVE Final    Comment: (NOTE) The Xpert Xpress SARS-CoV-2/FLU/RSV plus assay is intended as an aid in the diagnosis of influenza from Nasopharyngeal swab specimens and should not be used as a sole basis for treatment. Nasal washings and aspirates are unacceptable for Xpert Xpress SARS-CoV-2/FLU/RSV testing.  Fact Sheet for Patients: BloggerCourse.com  Fact Sheet for Healthcare Providers: SeriousBroker.it  This test is not yet approved or cleared by the [place]  FDA and has been authorized for detection and/or diagnosis of SARS-CoV-2 by FDA under an Emergency Use Authorization (EUA). This EUA will remain in effect (meaning this test can be used) for the duration of the COVID-19 declaration under Section 564(b)(1) of the Act, 21 U.S.C. section 360bbb-3(b)(1), unless the authorization is terminated or revoked.     Resp Syncytial Virus by PCR NEGATIVE NEGATIVE Final    Comment: (NOTE) Fact Sheet for  Patients: BloggerCourse.com  Fact Sheet for Healthcare Providers: SeriousBroker.it  This test is not yet approved or cleared by the [place]  FDA  and has been authorized for detection and/or diagnosis of SARS-CoV-2 by FDA under an Emergency Use Authorization (EUA). This EUA will remain in effect (meaning this test can be used) for the duration of the COVID-19 declaration under Section 564(b)(1) of the Act, 21 U.S.C. section 360bbb-3(b)(1), unless the authorization is terminated or revoked.  Performed at [facility], [address], [place], [place] [address]   Blood culture (routine x 2)     Status: None   Collection Time: 01/30/24  8:32 PM   Specimen: BLOOD RIGHT FOREARM  Result Value Ref Range Status   Specimen Description BLOOD RIGHT FOREARM  Final   Special Requests   Final    BOTTLES DRAWN AEROBIC AND ANAEROBIC Blood Culture results may not be optimal due to an inadequate volume of blood received in culture bottles   Culture   Final    NO GROWTH 5 DAYS Performed at [facility], [address], [place], [place] [address]    Report Status 02/04/2024 FINAL  Final  Blood culture (routine x 2)     Status: None   Collection Time: 01/30/24  8:40 PM   Specimen: BLOOD  Result Value Ref Range Status   Specimen Description BLOOD RIGHT ANTECUBITAL  Final   Special Requests   Final    BOTTLES DRAWN AEROBIC AND ANAEROBIC Blood Culture adequate volume   Culture   Final    NO GROWTH 5 DAYS Performed at [facility], [address] [place], [place] [address]    Report Status 02/04/2024 FINAL  Final  MRSA Next Gen by PCR, Nasal     Status: None   Collection Time: 01/31/24  1:03 AM   Specimen: Nasal Mucosa; Nasal Swab  Result Value Ref Range Status   MRSA by PCR Next Gen NOT DETECTED NOT DETECTED Final    Comment: (NOTE) The GeneXpert MRSA Assay (FDA approved for NASAL specimens only), is one component of a comprehensive MRSA colonization surveillance program. It is not intended to diagnose MRSA infection nor to guide or monitor treatment for MRSA infections. Test performance is not FDA approved in patients  less than 30 years old. Performed at [facility], [address], [place], [place] [address]   Urine Culture     Status: Abnormal (Preliminary result)   Collection Time: 02/03/24  8:22 AM   Specimen: Urine, Random  Result Value Ref Range Status   Specimen Description URINE, RANDOM  Final   Special Requests   Final    NONE Reflexed from [id number] Performed at [facility], [address] [place], [place] [address]    Culture >=100,000 COLONIES/mL ESCHERICHIA COLI (A)  Final   Report Status PENDING  Incomplete    Radiology Report CT HEAD WO CONTRAST ( ) Result Date: 02/04/2024 CLINICAL DATA:  63 year old male with altered mental status and subdural hematoma after a fall earlier this month. EXAM: CT HEAD WITHOUT CONTRAST TECHNIQUE: Contiguous axial images were obtained from the base of the skull through the vertex without intravenous contrast. RADIATION DOSE REDUCTION: This exam was performed according to the departmental dose-optimization program which includes automated exposure control, adjustment of the mA and/or kV according to patient size and/or use of iterative reconstruction technique. COMPARISON:  Head CTs 01/31/2024 and earlier. FINDINGS: Brain: Hyperdense right side hemispheric subdural hematoma has increased along  with intracranial mass effect since 01/31/2024. On coronal images the SDH is now up to 11 mm in thickness (coronal image 38) versus about 7 mm at the same level previously. Increased mass effect on the right lateral ventricle. Increased leftward midline shift from trace now. No uncal herniation at this time and basilar cisterns remain patent. Contralateral smaller left side subdural hematoma appears largely resolved since 01/31/2024. A small volume of layering subarachnoid hemorrhage in the left sylvian fissure is increased on series 4, image 17. No ventriculomegaly. Small volume layering occipital horn intraventricular blood is stable. Lobulated hyperdense SDH  along the right tentorium also mildly increased. Additional IVH versus linear hemorrhagic contusion at the junction of the right temporal and occipital lobes on series 4, image 12 is now apparent. No superimposed acute or chronic cortically based infarct identified. Stable gray-white differentiation. No cortically based acute infarct identified. Vascular: No suspicious intracranial vascular hyperdensity. Skull: Appears stable and intact. Sinuses/Orbits: Left nasoenteric tube remains in place. Visualized paranasal sinuses and mastoids are stable and well aerated. Other: Mild Disconjugate gaze is similar. Stable scalp soft tissues, no measurable scalp hematoma. IMPRESSION: 1. Largely resolved left side but Increased Right Side Subdural Hematoma since 01/31/2024. Right SDH now up to 11 mm with increased leftward midline shift, (now 4-5 mm) and increased mass effect on the right lateral ventricle. 2. Small volume SAH now apparent. Stable small volume IVH. No acute ventriculomegaly. Possible small superimposed right posterior Emma sphere hemorrhagic contusion. 3. No skull fracture identified. Electronically Signed   By: Marlise Simpers M.D.   On: 02/04/2024 09:00   DG Chest Port 1 View Result Date: 02/04/2024 CLINICAL DATA:  Shortness of breath. EXAM: PORTABLE CHEST 1 VIEW COMPARISON:  02/03/2024 FINDINGS: Low volume film. Right lung clear. Streaky density seen at the left base has become more patchy in the interval. Cardiopericardial silhouette is at upper limits of normal for size. NG tube passes into the stomach with tip appearing to be positioned in the region of the gastric fundus. Telemetry leads overlie the chest. IMPRESSION: Low volume film with increased patchy opacity at the left base compatible with atelectasis or infiltrate. Electronically Signed   By: Donnal Fusi M.D.   On: 02/04/2024 07:10   EEG adult Result Date: 02/03/2024 Arleene Lack, MD     02/03/2024 11:41 AM Patient Name: Shaban Aparicio MRN:  [id number] Epilepsy Attending: Arleene Lack Referring Physician/Provider: Brayton Calin, MD Date: 02/03/2024 Duration: 23.26 mins Patient history: 63yo M with ams. EEG to evaluate for seizure Level of alertness: Awake, asleep AEDs during EEG study: VPA, Xanax  Technical aspects: This EEG study was done with scalp electrodes positioned according to the 10-20 International system of electrode placement. Electrical activity was reviewed with band pass filter of 1-70Hz , sensitivity of 7 uV/mm, display speed of 41mm/sec with a 60Hz  notched filter applied as appropriate. EEG data were recorded continuously and digitally stored.  Video monitoring was available and reviewed as appropriate. Description: The posterior dominant rhythm consists of 8 Hz activity of moderate voltage (25-35 uV) seen predominantly in posterior head regions, symmetric and reactive to eye opening and eye closing. Sleep was characterized by vertex waves, sleep spindles (12 to 14 Hz), maximal frontocentral region. EEG showed intermittent generalized 3 to 6 Hz theta-delta slowing. Hyperventilation and photic stimulation were not performed.   ABNORMALITY - Intermittent slow, generalized IMPRESSION: This study is suggestive of mild diffuse encephalopathy. No seizures or epileptiform discharges were seen throughout the recording. Priyanka O Yadav  DG Chest Port 1 View Result Date: 02/03/2024 CLINICAL DATA:  Shortness of breath. EXAM: PORTABLE CHEST 1 VIEW COMPARISON:  01/30/2024 FINDINGS: Cardiopericardial silhouette is at upper limits of normal for size. Streaky density at the left base suggest atelectasis. No edema or focal dense airspace consolidation. No pleural effusion. NG tube tip is in the proximal stomach. Posttraumatic deformity noted left proximal humerus. Telemetry leads overlie the chest. IMPRESSION: Streaky density at the left base suggest atelectasis. Electronically Signed   By: Donnal Fusi M.D.   On: 02/03/2024 07:16     .rad Signature  -   Lynnwood Sauer M.D on 02/04/2024 at 9:38 AM   -  To page go to [email or web address]

## 2024-02-04 NOTE — Plan of Care (Signed)
 Pt has rested quietly throughout the night with no distress noted. Alert and oriented. On room air. SR on the monitor. Purewick on to suction. Incontinent of urine once, bathed, skin care and linens changed. NG intact with TF at 55. No complaints voiced. Remains with soft wrist restraints. Immediately goes for NG when restraints loosened. Mitts on also.     Problem: Metabolic: Goal: Ability to maintain appropriate glucose levels will improve Outcome: Progressing   Problem: Nutritional: Goal: Maintenance of adequate nutrition will improve Outcome: Progressing   Problem: Tissue Perfusion: Goal: Adequacy of tissue perfusion will improve Outcome: Progressing   Problem: Clinical Measurements: Goal: Respiratory complications will improve Outcome: Progressing Goal: Cardiovascular complication will be avoided Outcome: Progressing   Problem: Pain Managment: Goal: General experience of comfort will improve and/or be controlled Outcome: Progressing

## 2024-02-04 NOTE — Progress Notes (Addendum)
 NEUROLOGY CONSULT FOLLOW UP NOTE   Date of service: Feb 04, 2024 Patient Name: [name] MRN:  [id number] DOB:  1960/11/06  Interval Hx/subjective   Seen and examined. Called by the hospitalist regarding multiple issues: 1.  CT head shows worsening of the bleed on the right hemisphere and midline shift 2.  EEG with concern for some L RDA on the right-epileptologist recommends hookup to LTM 3.  Fluctuating mentation with somewhat more worsening level of consciousness over the past few days  Vitals   Vitals:   02/04/24 1231 02/04/24 1237 02/04/24 1500 02/04/24 1600  BP:  (!) 165/89 (!) 163/79 (!) 172/92  Pulse: 92 97 (!) 116 (!) 122  Resp: 12 13 15 15   Temp:  99.1 F (37.3 C)    TempSrc:  Axillary    SpO2: 95% 96% (!) 86% 94%  Weight:      Height:         Body mass index is 32.42 kg/m.  Physical Exam  General: Drowsy, does not open eyes to voice but does respond to some simple questions HEENT: Bruising on the left eye and forehead CVS: Regular rate rhythm Neurologic exam Drowsy, does not open eyes to voice but was able to tell me his name, got his age wrong by 1 year, mildly dysarthric speech More somnolent than yesterday Cranial nerves: Pupils are equal round reactive to light, extraocular movements difficult to assess but gaze is midline, left eyelid has a lot of swelling, facial sensation and symmetry seem preserved Motor examination reveals diminished left upper extremity strength but that is due to shoulder pain and has been baseline for him.  Right upper extremity appears full strength.  Not lifting his bilateral lower extremities against gravity today.  Withdraws to noxious admission Sensory exam: As above  Medications  Current Facility-Administered Medications:    ALPRAZolam  (XANAX ) tablet 0.5 mg, 0.5 mg, Per Tube, BID, Zelda Hickman, Prashant K, MD   arformoterol  (BROVANA ) nebulizer solution 15 mcg, 15 mcg, Nebulization, BID, Shelli Dexter M, PA-C, 15 mcg at  02/04/24 5784   budesonide  (PULMICORT ) nebulizer solution 0.25 mg, 0.25 mg, Nebulization, BID, Shelli Dexter M, PA-C, 0.25 mg at 02/04/24 0853   cefTRIAXone (ROCEPHIN) 1 g in sodium chloride  0.9 % 100 mL IVPB, 1 g, Intravenous, Q24H, Cala Castleman, MD, Last Rate: 200 mL/hr at 02/04/24 1029, 1 g at 02/04/24 1029   Chlorhexidine  Gluconate Cloth 2 % PADS 6 each, 6 each, Topical, Daily, Alva, Rakesh V, MD, 6 each at 02/04/24 1031   docusate sodium  (COLACE) capsule 100 mg, 100 mg, Oral, BID PRN, Shelli Dexter M, PA-C   haloperidol lactate (HALDOL) injection 2 mg, 2 mg, Intramuscular, Q6H PRN, Singh, Prashant K, MD   hydrALAZINE (APRESOLINE) injection 10 mg, 10 mg, Intravenous, Q4H PRN, Ogan, Okoronkwo U, MD, 10 mg at 02/02/24 1953   hydrALAZINE (APRESOLINE) tablet 50 mg, 50 mg, Per Tube, Q8H, Singh, Prashant K, MD, 50 mg at 02/04/24 1433   insulin  aspart (novoLOG ) injection 0-9 Units, 0-9 Units, Subcutaneous, Q4H, Reese, Stephanie M, PA-C, 2 Units at 02/04/24 1629   labetalol (NORMODYNE) injection 10 mg, 10 mg, Intravenous, Q2H PRN, Paliwal, Aditya, MD, 10 mg at 02/01/24 2047   levothyroxine  (SYNTHROID ) tablet 150 mcg, 150 mcg, Per Tube, Q0600, Deneise Finlay D, NP, 150 mcg at 02/04/24 0542   multivitamin with minerals tablet 1 tablet, 1 tablet, Per Tube, Daily, Trevor Fudge, MD, 1 tablet at 02/04/24 1025   nicotine (NICODERM CQ - dosed in mg/24 hours)  patch 14 mg, 14 mg, Transdermal, Daily, Ogan, Okoronkwo U, MD, 14 mg at 02/04/24 1029   Oral care mouth rinse, 15 mL, Mouth Rinse, 4 times per day, Trevor Fudge, MD, 15 mL at 02/04/24 1629   Oral care mouth rinse, 15 mL, Mouth Rinse, PRN, Trevor Fudge, MD, 15 mL at 02/02/24 0529   pantoprazole  (PROTONIX ) injection 40 mg, 40 mg, Intravenous, QHS, Reese, Stephanie M, PA-C, 40 mg at 02/03/24 2203   polyethylene glycol (MIRALAX  / GLYCOLAX ) packet 17 g, 17 g, Oral, Daily PRN, Shelli Dexter M, PA-C   revefenacin  (YUPELRI ) nebulizer solution 175  mcg, 175 mcg, Nebulization, Daily, Shelli Dexter M, PA-C, 175 mcg at 02/04/24 4098   sodium chloride  tablet 1 g, 1 g, Per NG tube, BID, Cala Castleman, MD, 1 g at 02/04/24 1025   thiamine (VITAMIN B1) tablet 100 mg, 100 mg, Per Tube, Daily, Chand, Alease Hunter, MD, 100 mg at 02/04/24 1025   traMADol  (ULTRAM ) tablet 50 mg, 50 mg, Per Tube, Q6H PRN, Cala Castleman, MD   valproic acid  (DEPAKENE ) 250 MG/5ML solution 500 mg, 500 mg, Per Tube, BID, Deneise Finlay D, NP, 500 mg at 02/04/24 1024  Labs and Diagnostic Imaging   CBC:  Recent Labs  Lab 02/03/24 0445 02/04/24 0436  WBC 15.2* 16.5*  NEUTROABS  --  10.7*  HGB 10.0* 10.6*  HCT 27.5* 29.7*  MCV 87.9 86.8  PLT 157 162    Basic Metabolic Panel:  Lab Results  Component Value Date   NA 128 (L) 02/04/2024   K 3.3 (L) 02/04/2024   CO2 26 02/04/2024   GLUCOSE 144 (H) 02/04/2024   BUN 22 02/04/2024   CREATININE 0.65 02/04/2024   CALCIUM  9.2 02/04/2024   GFRNONAA >60 02/04/2024   GFRAA >60 04/19/2019   Lipid Panel: No results found for: "LDLCALC" HgbA1c:  Lab Results  Component Value Date   HGBA1C 6.1 (H) 01/31/2024   Urine Drug Screen:     Component Value Date/Time   LABOPIA NONE DETECTED 01/30/2024 2003   COCAINSCRNUR NONE DETECTED 01/30/2024 2003   LABBENZ POSITIVE (A) 01/30/2024 2003   AMPHETMU NONE DETECTED 01/30/2024 2003   THCU NONE DETECTED 01/30/2024 2003   LABBARB NONE DETECTED 01/30/2024 2003    Alcohol Level     Component Value Date/Time   [facility] <15 01/30/2024 2032   INR  Lab Results  Component Value Date   INR 1.0 01/31/2024   APTT No results found for: "APTT" AED levels:  Lab Results  Component Value Date   LEVETIRACETA <2.0 (L) 01/30/2024    CT Head without contrast(Personally reviewed): 1. Acute 9 mm thick right cerebral convexity subdural hemorrhage which extends along the right tentorial leaflet. Resulting 3 mm leftward midline shift. 2. Scattered small volume of acute subarachnoid  hemorrhage. 3. No evidence of acute fracture or traumatic malalignment in the cervical spine.  Repeat CT head 5/13 1. Largely resolved left side but Increased Right Side Subdural Hematoma since 01/31/2024. Right SDH now up to 11 mm with increased leftward midline shift, (now 4-5 mm) and increased mass effect on the right lateral ventricle. 2. Small volume SAH now apparent. Stable small volume IVH. No acute ventriculomegaly. Possible small superimposed right posterior hemisphere hemorrhagic contusion. 3. No skull fracture identified.   cEEG:  This study is suggestive of cortical dysfunction arising from right hemisphere likely secondary to underlying structural abnormality/ subdural hematoma. This EEG pattern is on the ictal-interictal continuum with higher potential for seizure recurrence. Consider long term  eeg monitoring if concern for ictal-interictal activity persists.   Signed, Imogene Mana, NP [facility]  Attending Neurohospitalist Addendum Patient seen and examined with APP/Resident. Agree with the history and physical as documented above. Agree with the plan as documented, which I helped formulate. I have independently reviewed the chart, obtained history, review of systems and examined the patient.I have personally reviewed pertinent head/neck/spine imaging (CT/MRI).     Assessment   [name] is a 63 y.o. male past history of COPD, anxiety, hypothyroidism, diabetes, hypertension, lipidemia and seizure disorder admitted for multiple falls and found to have right greater than left subdural hematomas with a leftward midline shift which has worsened over the past few days.  Neurology was consulted for worsening mentation.  Initial EEG yesterday did not show any focality but repeat EEG done today because of concern for worsening level of consciousness shows cortical dysfunction arising from the right hemisphere-EEG pattern ictal interictal continuum with hypertension  for seizure recurrence. He is currently on Depakote  His CT head also reveals worsening of the right-sided subdural and worsening right-to-left midline shift.  Impression: Breakthrough seizures in the setting of subdural hematoma, subdural hematoma  Recommendations  LTM EEG hookup Continue Depakote  500 twice daily For the second agent I would have preferred Keppra  but he has allergies listed in terms of hyperactivity and agitation-will avoid that. Second antiepileptic agent-load with Vimpat 200 mg IV x 1 and start Vimpat 50 twice daily. Would recommend reaching out to neurosurgery for consideration for subdural hematoma evacuation-I was told by the hospitalist that they have been advised to watch but I did not see any note in the chart from neurosurgery in the last couple days. Plan was discussed with Dr. Zelda Hickman ______________________________________________________________________  Please feel free to call with any questions.  -- Tona Francis, MD Neurologist [facility] Pager: [phone]

## 2024-02-04 NOTE — Progress Notes (Signed)
 EEG complete - results pending

## 2024-02-04 NOTE — Progress Notes (Signed)
 Speech Language Pathology Treatment: Dysphagia  Patient Details Name: [name] MRN: [id number] DOB: 02-24-1961 Today's Date: 02/04/2024 Time: 4098-1191 SLP Time Calculation (min) (ACUTE ONLY): 32 min  Assessment / Plan / Recommendation Clinical Impression  Pt seen for dysphagia f/u session for po readiness.  Pt required mod-max multimodal cues to maintain attention/alertness level.  He interacted with single word responses and followed 1-step commands consistently.  He was oriented to self/place, but not time/situation.  Intermittent confusion and flat affect noted during session.  Pt did consume puree, ice chips (single) and various volumes of thin liquids with prolonged bolus formation/slight oral holding with all consistencies and SLP providing mod verbal/tactile cues.  One incidence of immediate cough observed with thin via tsp, but other volumes of thin adequate with timing, propulsion and no overt s/s of aspiration observed with these consistencies with pt being min impulsive with straw sips despite mod verbal cueing provided by SLP.  Pt eager to consume given consistencies during session.  Wife in attendance and educated regarding aspiration/swallowing precautions necessary d/t pt's current cognitive status.  Recommend initiating a conservative diet of Dysphagia 1(puree)/thin liquids with FULL supervision/assistance with meals.  ST will f/u for dysphagia management/tx, SLE and potential need for objective assessment during acute stay.    HPI HPI: Pt is a 63 y.o. male who was brought to ED by EMS from home after a fall. CT head (01/30/23) revealed "Slightly increased size of right larger than left subdural hematomas. Small volume subarachnoid hemorrhage with mildly increased hemorrhage in the left lateral ventricle. Failed yale 02/01/24. PMH: Seizure disorder, COPD; ST f/u for po readiness in acute setting; pt currently has TF for nutritive/hydration purposes.      SLP Plan  Goals updated       Recommendations for follow up therapy are one component of a multi-disciplinary discharge planning process, led by the attending physician.  Recommendations may be updated based on patient status, additional functional criteria and insurance authorization.    Recommendations  Diet recommendations: Dysphagia 1 (puree);Thin liquid Liquids provided via: Cup;Straw (with full supervision/assistance) Medication Administration: Crushed with puree (or via TF (if remains)) Supervision: Full supervision/cueing for compensatory strategies;Trained caregiver to feed patient Compensations: Slow rate;Small sips/bites;Minimize environmental distractions Postural Changes and/or Swallow Maneuvers: Seated upright 90 degrees                  Oral care BID;Staff/trained caregiver to provide oral care   Frequent or constant Supervision/Assistance Dysphagia, unspecified (R13.10)     Goals updated     Pat Pamelia Botto,M.S.,CCC-SLP  02/04/2024, 1:21 PM

## 2024-02-04 NOTE — Progress Notes (Signed)
 LTM EEG hooked up and running - no initial skin breakdown - push button tested - [facility] monitoring.CT leads used

## 2024-02-05 ENCOUNTER — Inpatient Hospital Stay (HOSPITAL_COMMUNITY): Payer: Medicare (Managed Care)

## 2024-02-05 ENCOUNTER — Other Ambulatory Visit: Payer: Self-pay

## 2024-02-05 DIAGNOSIS — F419 Anxiety disorder, unspecified: Secondary | ICD-10-CM

## 2024-02-05 DIAGNOSIS — N390 Urinary tract infection, site not specified: Secondary | ICD-10-CM

## 2024-02-05 DIAGNOSIS — R001 Bradycardia, unspecified: Secondary | ICD-10-CM | POA: Diagnosis not present

## 2024-02-05 DIAGNOSIS — S065XAA Traumatic subdural hemorrhage with loss of consciousness status unknown, initial encounter: Secondary | ICD-10-CM | POA: Diagnosis not present

## 2024-02-05 DIAGNOSIS — J449 Chronic obstructive pulmonary disease, unspecified: Secondary | ICD-10-CM | POA: Diagnosis not present

## 2024-02-05 DIAGNOSIS — R569 Unspecified convulsions: Secondary | ICD-10-CM | POA: Diagnosis not present

## 2024-02-05 LAB — COMPREHENSIVE METABOLIC PANEL WITH GFR
ALT: 40 U/L (ref 0–44)
AST: 50 U/L — ABNORMAL HIGH (ref 15–41)
Albumin: 3.4 g/dL — ABNORMAL LOW (ref 3.5–5.0)
Alkaline Phosphatase: 53 U/L (ref 38–126)
Anion gap: 11 (ref 5–15)
BUN: 21 mg/dL (ref 8–23)
CO2: 23 mmol/L (ref 22–32)
Calcium: 9.1 mg/dL (ref 8.9–10.3)
Chloride: 97 mmol/L — ABNORMAL LOW (ref 98–111)
Creatinine, Ser: 0.65 mg/dL (ref 0.61–1.24)
GFR, Estimated: >60 mL/min (ref >60)
Glucose, Bld: 151 mg/dL — ABNORMAL HIGH (ref 70–99)
Potassium: 3.8 mmol/L (ref 3.5–5.1)
Sodium: 131 mmol/L — ABNORMAL LOW (ref 135–145)
Total Bilirubin: 0.7 mg/dL (ref 0.0–1.2)
Total Protein: 7.6 g/dL (ref 6.5–8.1)

## 2024-02-05 LAB — CBC WITH DIFFERENTIAL/PLATELET
Abs Immature Granulocytes: 0.12 10*3/uL — ABNORMAL HIGH (ref 0.00–0.07)
Basophils Absolute: 0 10*3/uL (ref 0.0–0.1)
Basophils Relative: 0 %
Eosinophils Absolute: 0 10*3/uL (ref 0.0–0.5)
Eosinophils Relative: 0 %
HCT: 29.9 % — ABNORMAL LOW (ref 39.0–52.0)
Hemoglobin: 10.5 g/dL — ABNORMAL LOW (ref 13.0–17.0)
Immature Granulocytes: 1 %
Lymphocytes Relative: 22 %
Lymphs Abs: 3.2 10*3/uL (ref 0.7–4.0)
MCH: 30.9 pg (ref 26.0–34.0)
MCHC: 35.1 g/dL (ref 30.0–36.0)
MCV: 87.9 fL (ref 80.0–100.0)
Monocytes Absolute: 2.1 10*3/uL — ABNORMAL HIGH (ref 0.1–1.0)
Monocytes Relative: 15 %
Neutro Abs: 9.1 10*3/uL — ABNORMAL HIGH (ref 1.7–7.7)
Neutrophils Relative %: 62 %
Platelets: 195 10*3/uL (ref 150–400)
RBC: 3.4 MIL/uL — ABNORMAL LOW (ref 4.22–5.81)
RDW: 15.9 % — ABNORMAL HIGH (ref 11.5–15.5)
WBC: 14.6 10*3/uL — ABNORMAL HIGH (ref 4.0–10.5)
nRBC: 0.3 % — ABNORMAL HIGH (ref 0.0–0.2)

## 2024-02-05 LAB — PHENOBARBITAL LEVEL: Phenobarbital: 38.1 ug/mL (ref 15.0–40.0)

## 2024-02-05 LAB — URINE CULTURE: Culture: >=100000 — AB

## 2024-02-05 LAB — BLOOD GAS, ARTERIAL
Acid-Base Excess: 4.7 mmol/L — ABNORMAL HIGH (ref 0.0–2.0)
Bicarbonate: 28.2 mmol/L — ABNORMAL HIGH (ref 20.0–28.0)
Drawn by: 34762
O2 Saturation: 98.8 %
Patient temperature: 36.9
pCO2 arterial: 37 mmHg (ref 32–48)
pH, Arterial: 7.49 — ABNORMAL HIGH (ref 7.35–7.45)
pO2, Arterial: 91 mmHg (ref 83–108)

## 2024-02-05 LAB — GLUCOSE, CAPILLARY
Glucose-Capillary: 160 mg/dL — ABNORMAL HIGH (ref 70–99)
Glucose-Capillary: 157 mg/dL — ABNORMAL HIGH (ref 70–99)
Glucose-Capillary: 157 mg/dL — ABNORMAL HIGH (ref 70–99)
Glucose-Capillary: 115 mg/dL — ABNORMAL HIGH (ref 70–99)
Glucose-Capillary: 127 mg/dL — ABNORMAL HIGH (ref 70–99)
Glucose-Capillary: 127 mg/dL — ABNORMAL HIGH (ref 70–99)

## 2024-02-05 LAB — PROTIME-INR
INR: 1 (ref 0.8–1.2)
Prothrombin Time: 12.9 s (ref 11.4–15.2)

## 2024-02-05 LAB — APTT: aPTT: 24 s (ref 24–36)

## 2024-02-05 LAB — PROCALCITONIN: Procalcitonin: 0.1 ng/mL

## 2024-02-05 LAB — MAGNESIUM: Magnesium: 2 mg/dL (ref 1.7–2.4)

## 2024-02-05 LAB — PHOSPHORUS: Phosphorus: 3.5 mg/dL (ref 2.5–4.6)

## 2024-02-05 LAB — UREA NITROGEN, URINE: Urea Nitrogen, Ur: 1009 mg/dL

## 2024-02-05 LAB — C-REACTIVE PROTEIN: CRP: 1.9 mg/dL — ABNORMAL HIGH (ref <1.0)

## 2024-02-05 MED ORDER — SODIUM CHLORIDE 0.9 % IV SOLN: INTRAVENOUS | Status: DC

## 2024-02-05 MED ORDER — ADULT MULTIVITAMIN W/MINERALS CH
1.0000 | ORAL_TABLET | Freq: Every day | ORAL | Status: DC
  Administered 2024-02-06 – 2024-02-24 (×19): 1 via ORAL
  Filled 2024-02-05 (×20): qty 1

## 2024-02-05 MED ORDER — VALPROATE SODIUM 100 MG/ML IV SOLN
500.0000 mg | Freq: Two times a day (BID) | INTRAVENOUS | Status: DC
  Administered 2024-02-05 (×2): 500 mg via INTRAVENOUS
  Filled 2024-02-05 (×5): qty 5

## 2024-02-05 MED ORDER — SODIUM CHLORIDE 1 G PO TABS
1.0000 g | ORAL_TABLET | Freq: Two times a day (BID) | ORAL | Status: DC
  Administered 2024-02-05 – 2024-02-24 (×37): 1 g via ORAL
  Filled 2024-02-05 (×39): qty 1

## 2024-02-05 MED ORDER — SODIUM CHLORIDE 0.9% FLUSH: 10.0000 mL | INTRAVENOUS | Status: DC | PRN

## 2024-02-05 MED ORDER — SODIUM CHLORIDE 0.9% FLUSH
10.0000 mL | Freq: Two times a day (BID) | INTRAVENOUS | Status: DC
  Administered 2024-02-05: 10 mL
  Administered 2024-02-06: 30 mL
  Administered 2024-02-06 – 2024-02-07 (×2): 20 mL
  Administered 2024-02-08: 10 mL
  Administered 2024-02-08: 20 mL
  Administered 2024-02-09 – 2024-02-24 (×31): 10 mL

## 2024-02-05 MED ORDER — ALPRAZOLAM 0.25 MG PO TABS
0.5000 mg | ORAL_TABLET | Freq: Two times a day (BID) | ORAL | Status: DC
  Administered 2024-02-05 – 2024-02-24 (×39): 0.5 mg via ORAL
  Filled 2024-02-05: qty 2
  Filled 2024-02-05 (×2): qty 1
  Filled 2024-02-05: qty 2
  Filled 2024-02-05 (×11): qty 1
  Filled 2024-02-05: qty 2
  Filled 2024-02-05: qty 1
  Filled 2024-02-05 (×2): qty 2
  Filled 2024-02-05 (×2): qty 1
  Filled 2024-02-05: qty 2
  Filled 2024-02-05 (×4): qty 1
  Filled 2024-02-05: qty 2
  Filled 2024-02-05 (×7): qty 1
  Filled 2024-02-05: qty 2
  Filled 2024-02-05 (×4): qty 1

## 2024-02-05 MED ORDER — HYDRALAZINE HCL 50 MG PO TABS
50.0000 mg | ORAL_TABLET | Freq: Three times a day (TID) | ORAL | Status: DC
  Administered 2024-02-05 – 2024-02-08 (×8): 50 mg via ORAL
  Filled 2024-02-05 (×10): qty 1

## 2024-02-05 MED ORDER — THIAMINE MONONITRATE 100 MG PO TABS
100.0000 mg | ORAL_TABLET | Freq: Every day | ORAL | Status: AC
  Administered 2024-02-06: 100 mg via ORAL
  Filled 2024-02-05 (×2): qty 1

## 2024-02-05 MED ORDER — VALPROIC ACID 250 MG/5ML PO SOLN
500.0000 mg | Freq: Two times a day (BID) | ORAL | Status: DC
  Filled 2024-02-05: qty 10

## 2024-02-05 MED ORDER — TRAMADOL HCL 50 MG PO TABS
50.0000 mg | ORAL_TABLET | Freq: Four times a day (QID) | ORAL | Status: DC | PRN
  Administered 2024-02-13 – 2024-02-19 (×5): 50 mg via ORAL
  Filled 2024-02-05 (×5): qty 1

## 2024-02-05 MED ORDER — CLONIDINE HCL 0.1 MG/24HR TD PTWK
0.1000 mg | MEDICATED_PATCH | TRANSDERMAL | Status: DC
  Administered 2024-02-05: 0.1 mg via TRANSDERMAL
  Filled 2024-02-05: qty 1

## 2024-02-05 MED ORDER — LEVOTHYROXINE SODIUM 75 MCG PO TABS
150.0000 ug | ORAL_TABLET | Freq: Every day | ORAL | Status: DC
  Administered 2024-02-06 – 2024-02-24 (×18): 150 ug via ORAL
  Filled 2024-02-05 (×18): qty 2

## 2024-02-05 MED ORDER — SODIUM CHLORIDE 0.9 % IV SOLN
20.0000 mg/kg | Freq: Once | INTRAVENOUS | Status: AC
  Administered 2024-02-05: 1760.2 mg via INTRAVENOUS
  Filled 2024-02-05: qty 4

## 2024-02-05 MED ORDER — HYDRALAZINE HCL 20 MG/ML IJ SOLN
5.0000 mg | INTRAMUSCULAR | Status: DC | PRN
  Administered 2024-02-05 – 2024-02-23 (×6): 5 mg via INTRAVENOUS
  Filled 2024-02-05 (×6): qty 1

## 2024-02-05 MED ORDER — SODIUM CHLORIDE 0.9 % IV SOLN: 100.0000 mg | Freq: Once | INTRAVENOUS | Status: DC

## 2024-02-05 MED ORDER — SODIUM CHLORIDE 0.9 % IV SOLN
200.0000 mg | Freq: Two times a day (BID) | INTRAVENOUS | Status: DC
  Administered 2024-02-05 – 2024-02-07 (×5): 200 mg via INTRAVENOUS
  Filled 2024-02-05 (×6): qty 20

## 2024-02-05 NOTE — Progress Notes (Signed)
 Patient ID: [name], male   DOB: [birth year], 63 y.o.   MRN: [id number] Films reviewed yesterday. Based on the subdural alone there is little if any indication to subject this gentleman to a craniotomy. There are only 4mm of shift. The basal cisterns are widely patent, no signs or symptoms of transtentorial herniation.  I will see [name] this afternoon. Maximum medical treatment for seizures are indicated prior to subjecting him to an operative procedure.

## 2024-02-05 NOTE — Procedures (Signed)
 Patient Name: [name]  MRN: [id number]  Epilepsy Attending: Arleene Lack  Referring Physician/Provider: Arleene Lack, MD  Duration: 02/04/2024 1724 to 02/05/2024 1724  Patient history: 63yo M with right sub dural hematoma. EEG to evaluate for seizure   Level of alertness: Awake/lethargic   AEDs during EEG study: VPA, Xanax , LCM   Technical aspects: This EEG study was done with scalp electrodes positioned according to the 10-20 International system of electrode placement. Electrical activity was reviewed with band pass filter of 1-70Hz , sensitivity of 7 uV/mm, display speed of 60mm/sec with a 60Hz  notched filter applied as appropriate. EEG data were recorded continuously and digitally stored.  Video monitoring was available and reviewed as appropriate.   Description: EEG showed continuous generalized and lateralized right hemisphere 3 to 7 Hz theta-delta slowing. Rhythmic sharply contoured 3-4hz  theta-delta slowing was noted in right hemisphere with waxing and waning morphology. Hyperventilation and photic stimulation were not performed.     Seizures without clinical signs were noted in right posterior quadrant. During seizure, eeg showed lateralized periodic discharges at 1.5-2hz  in right hemisphere, maximal right posterior quadrant which then evolved into 2.5-3hz  delta slowing admixed with spikes and involved left hemisphere. Average 3-6 seizures were noted per hour, lasting about 2 minutes each. As medications were adjusted, seizures resolved after around 1220 on 5/14/20255.  Subsequently EEG showed continuous generalized 3 to 6 Hz theta-delta slowing admixed with 12 to 13 Hz beta activity.   ABNORMALITY - Seizures without clinical signs, right posterior quadrant.  - Lateralized rhythmic delta slowing, right hemisphere ( LRDA) - Continuous slow, generalized and lateralized right hemisphere   IMPRESSION: This study showed seizures without clinical signs arising from  right  posterior quadrant. Average 3-6 seizures were noted per hour, lasting about 2 minutes each. As medications were adjusted, seizures resolved after around 1220 on 5/14/20255.   Additionally there was cortical dysfunction arising from right hemisphere likely secondary to underlying structural abnormality/ subdural hematoma. Lastly there was moderate diffuse encephalopathy.     [name]

## 2024-02-05 NOTE — Progress Notes (Signed)
 Physical Therapy Treatment Patient Details Name: [name] MRN: [id number] DOB: 1961-04-14 Today's Date: 02/05/2024   History of Present Illness Pt is a 63 y.o. male presenting 5/8 after a fall at home. UDS positive for benzodiazepines. EKG with sinus bradycardia. Head CT 5/8 showed 9 mm right subdural hematoma with 3 mm midline shift and small volume acute SAH.  Pt on continuous EEG 5/14 with continued seizures.  PMH significant for seizures and COPD.    PT Comments  Pt admitted with above diagnosis. Pt currently with continuous EEG therefore only able to perform bed level exercises.  Pt is following commands and is able to perform exercises all 4 extremities. MD will advise when pt can incr activity.  Will follow acutely.  Pt currently with functional limitations due to the deficits listed below (see PT Problem List). Pt will benefit from acute skilled PT to increase their independence and safety with mobility to allow discharge.       If plan is discharge home, recommend the following: Two people to help with walking and/or transfers;A lot of help with bathing/dressing/bathroom;Assistance with cooking/housework;Assistance with feeding;Direct supervision/assist for medications management;Direct supervision/assist for financial management;Assist for transportation;Help with stairs or ramp for entrance;Supervision due to cognitive status   Can travel by private vehicle     No  Equipment Recommendations  Other (comment) (TBA)    Recommendations for Other Services       Precautions / Restrictions Precautions Precautions: Fall Recall of Precautions/Restrictions: Impaired Restrictions Weight Bearing Restrictions Per Provider Order: No     Mobility  Bed Mobility               General bed mobility comments: Bed level exercises as nurse and MD dont want pt oob until seizures lessen.    Transfers                   General transfer comment: NT    Ambulation/Gait                    Stairs             Wheelchair Mobility     Tilt Bed    Modified Rankin (Stroke Patients Only)       Balance                                            Communication Communication Communication: No apparent difficulties  Cognition Arousal: Lethargic Behavior During Therapy: Flat affect   PT - Cognitive impairments: Difficult to assess, Orientation, Awareness, Memory, Sequencing, Problem solving, Safety/Judgement Difficult to assess due to: Level of arousal (participation) Orientation impairments: Place, Time                   PT - Cognition Comments: pt following commands inconsistently. pt oriented to self, able to name his wife Following commands: Impaired Following commands impaired: Follows one step commands inconsistently, Follows one step commands with increased time    Cueing Cueing Techniques: Verbal cues  Exercises General Exercises - Upper Extremity Shoulder Flexion: PROM, Left, 5 reps, Supine (AA/ROM right 5 reps) Elbow Flexion: AAROM, 5 reps, Both, Supine Elbow Extension: AAROM, Both, 5 reps, Supine General Exercises - Lower Extremity Ankle Circles/Pumps: AROM, Both, 5 reps, Supine Quad Sets: AROM, Both, 5 reps, Supine Heel Slides: Both, Supine, AAROM, 5 reps    General Comments General comments (  skin integrity, edema, etc.): VSS      Pertinent Vitals/Pain Pain Assessment Pain Assessment: No/denies pain Faces Pain Scale: No hurt    Home Living Family/patient expects to be discharged to:: Private residence Living Arrangements: Spouse/significant other Available Help at Discharge: Family;Available 24 hours/day Type of Home: House Home Access: Stairs to enter Entrance Stairs-Rails: Right Entrance Stairs-Number of Steps: 4   Home Layout: One level Home Equipment: Rollator (4 wheels);Cane - single point;BSC/3in1;Standard Environmental consultant;Shower seat      Prior Function            PT Goals (current  goals can now be found in the care plan section) Progress towards PT goals: Not progressing toward goals - comment (On continuous EEG, limited to bed level only)    Frequency    Min 2X/week      PT Plan      Co-evaluation              AM-PAC PT "6 Clicks" Mobility   Outcome Measure  Help needed turning from your back to your side while in a flat bed without using bedrails?: Total Help needed moving from lying on your back to sitting on the side of a flat bed without using bedrails?: Total Help needed moving to and from a bed to a chair (including a wheelchair)?: Total Help needed standing up from a chair using your arms (e.g., wheelchair or bedside chair)?: Total Help needed to walk in hospital room?: Total Help needed climbing 3-5 steps with a railing? : Total 6 Click Score: 6    End of Session   Activity Tolerance: Patient limited by fatigue;Patient limited by lethargy Patient left: in bed;with call bell/phone within reach;with bed alarm set;with SCD's reapplied Nurse Communication: Mobility status;Need for lift equipment PT Visit Diagnosis: Unsteadiness on feet (R26.81);Muscle weakness (generalized) (M62.81);History of falling (Z91.81)     Time: 0981-1914 PT Time Calculation (min) (ACUTE ONLY): 11 min  Charges:    $Therapeutic Exercise: 8-22 mins PT General Charges $$ ACUTE PT VISIT: 1 Visit                     Seymour Pavlak M,PT Acute Rehab Services [phone]    [name] 02/05/2024, 12:21 PM

## 2024-02-05 NOTE — Progress Notes (Signed)
 SLP Cancellation Note  Patient Details Name: [name] MRN: [id number] DOB: July 14, 1961   Cancelled treatment:       Reason Eval/Treat Not Completed: Other (comment) (pt npo for surgery per RN, will follow up next date)   Chantal Comment 02/05/2024, 9:32 AM Maudie Sorrow, MS [facility] SLP Acute Rehab Services Office [phone]

## 2024-02-05 NOTE — Progress Notes (Signed)
 PROGRESS NOTE                                                                                                                                                                                                             Patient Demographics:    [name], is a 63 y.o. male, DOB - [birth year], OZH:[id number]  Outpatient Primary MD for the patient is Claudell Cruz, MD    LOS - 6  Admit date - 01/30/2024    Chief Complaint  Patient presents with   Fall   Altered Mental Status       Brief Narrative (HPI from H&P)   63 year old man who was brought in by EMS from home after a fall.  Apparently wife called, patient is currently ANO x 1 and history obtained after chart review, unable to reach wife.  His heart rate initially was in the 80s, A/O x 1 per EMS, bradycardic to 40s in the ED , noted to be hypothermic 94.  Bruises of multiple ages left hip left eye and left shoulder Labs significant for no leukocytosis, hemoglobin 9.3, mild thrombocytopenia 1 27K, mild hypokalemia 3.3, hypomagnesemia 1.5, normal LFTs.  UDS positive for benzodiazepines, tox screen negative, ammonia 19, TSH 48.  EKG showed sinus bradycardia.  He was given 1 g of magnesium , 100 mg of IV Solu-Cortef  and 200 mics of levothyroxine  IV. Head CT showed 9 mm right subdural hematoma with 3 mm midline shift and small volume acute SAH, cervical spine was negative.  He was admitted to ICU by pulmonary critical care, seen by neurosurgeon Dr. Michale Age, stabilized and transferred to my care on 02/03/2024 on day 4 of his hospital stay, still little confused with NG tube in.  Significant Hospital Events: Including procedures, antibiotic start and stop dates in addition to other pertinent events   5/8 Admitted 5/9 No overnight issues, patient remained confused and restless, trying to get out of bed 5/10 ongoing issues with agitation overnight 5/11 mentation improved this a.m.  with less impulsivity 02/03/2024 transferred to [facility] 02/04/2024 LTM EEG   Subjective:   More lethargic and obtunded this morning, as discussed with neurology, LTM showing evidence of recurrent seizures and his medication has been frequently uptitrated   Assessment  & Plan :    Acute right subdural hematoma with 3 mm midline shift - Acute  traumatic subarachnoid hemorrhage status post fall  -Repeat head CT 5/9 with slightly increased size of right larger than left subdural hematomas with 3 mm leftward midline shift, neurosurgery on board, appreciate their input.  - repeat CT scan on 02/04/2024 done as he was slightly drowsy, mild worsening on the right sided SDH. - Surgery will reevaluate today..  Acute metabolic encephalopathy. - This is multifactorial, in the setting of subdural hematoma, seizures, and multiple AEDs - Will keep n.p.o. 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  Unseld on compliance.   Bradycardia/hypothermia due to hypothyroidism - resolved - With initiation of IV Synthroid  bradycardia has now resolved, continue to monitor on telemetry.  Echo stable.   Anxiety disorder on chronic benzodiazepine -It appears patient is on high-dose benzodiazepines upwards of 4 mg of Xanax  nightly P: Continue reduced dosed home Xanax  1 mg twice daily Monitor for signs of withdrawal   COPD, not in  exacerbation Tobacco dependence P: Aspiration precautions Continue home Brovana  and Yupelri  Smoking cessation education when appropriate   Hyponatremia  - Sodium level 132 on admission with trending after he received IV fluids in the ICU, clinically appears euvolemic, with head injury and dural bleed suspicious for SIADH, is on fluid restriction with trail of Lasix  on 02/03/2024, marginal improvement   UTI.   - continue with Rocephin   Prediabetes P: Continue SSI CBG goal 140-180 CBG checks every 4  Lab Results  Component Value Date   HGBA1C 6.1 (H) 01/31/2024   CBG (last 3)  Recent Labs    02/05/24 0417 02/05/24 0753 02/05/24 1146  GLUCAP 160* 157* 157*          Condition -  Guarded  Family Communication  :  Cussed with wife at bedside  Code Status : Full code  Consults  : PCCM, neurosurgery Dr. Michale Age, neurology, nephrology  PUD Prophylaxis : PPI   Procedures  :     EEG 02/03/2024.  No seizures.     CT head 02/04/2024.  Noted discussed with Dr. Michale Age neurosurgery.  No interventions needed.    1. Largely resolved left side but Increased Right Side Subdural Hematoma since 01/31/2024. Right SDH now up to 11 mm with increased leftward midline shift, (now 4-5 mm) and increased mass effect on the right lateral ventricle. 2. Small volume SAH now apparent. Stable small volume IVH. No acute ventriculomegaly. Possible small superimposed right posterior Emma sphere hemorrhagic contusion. 3. No skull fracture identified.   CT 5/9 -  1. Slightly increased size of right larger than left subdural hematomas. Unchanged mass effect and 3 mm of leftward midline shift. 2. Small volume subarachnoid hemorrhage with mildly increased hemorrhage in the left lateral ventricle, possibly redistribution.  CT 5/8 -   1. Acute 9 mm thick right cerebral convexity subdural hemorrhage which extends along the right tentorial leaflet. Resulting 3 mm leftward midline shift. 2. Scattered  small volume of acute subarachnoid hemorrhage. 3. No evidence of  acute fracture or traumatic malalignment in the cervical spine.  TTE - 1. Left ventricular ejection fraction, by estimation, is 60 to 65% . The left ventricle has normal function. The left ventricle has no regional wall motion abnormalities. Left ventricular diastolic parameters are consistent with Grade I diastolic dysfunction ( impaired relaxation) . 2. Right ventricular systolic function is normal. The right ventricular size is normal. 3. The mitral valve is normal in structure. No evidence of mitral valve regurgitation. No evidence of mitral stenosis. 4. The aortic valve was not well visualized. Aortic valve regurgitation is mild. No aortic stenosis is present. 5. The inferior vena cava is normal in size with greater than 50% respiratory variability, suggesting right atrial pressure of 3 mmHg.      Disposition Plan  :    Status is: Inpatient  DVT Prophylaxis  :    Place and maintain sequential compression device Start: 02/05/24 0657 SCDs Start: 01/30/24 2330   Lab Results  Component Value Date   PLT 195 02/05/2024    Diet :  Diet Order             Diet NPO time specified  Diet effective now                    Inpatient Medications  Scheduled Meds:  ALPRAZolam   0.5 mg Oral BID   arformoterol   15 mcg Nebulization BID   budesonide  (PULMICORT ) nebulizer solution  0.25 mg Nebulization BID   Chlorhexidine  Gluconate Cloth  6 each Topical Daily   cloNIDine  0.1 mg Transdermal Q Wed   hydrALAZINE  50 mg Oral Q8H   insulin  aspart  0-9 Units Subcutaneous Q4H   [START ON 02/06/2024] levothyroxine   150 mcg Oral Q0600   multivitamin with minerals  1 tablet Oral Daily   nicotine  14 mg Transdermal Daily   mouth rinse  15 mL Mouth Rinse 4 times per day   pantoprazole  (PROTONIX ) IV  40 mg Intravenous QHS   revefenacin   175 mcg Nebulization Daily   sodium chloride   1 g Oral BID   thiamine  100 mg Oral Daily    Continuous Infusions:  sodium chloride  75 mL/hr at 02/05/24 0855   cefTRIAXone (ROCEPHIN)  IV 1 g (02/05/24 1204)   lacosamide (VIMPAT) IV     PHENObarbital     valproate sodium      PRN Meds:.docusate sodium , haloperidol lactate, hydrALAZINE, hydrALAZINE, labetalol, mouth rinse, polyethylene glycol, traMADol   Antibiotics  :    Anti-infectives (From admission, onward)    Start     Dose/Rate Route Frequency Ordered Stop   02/04/24 1030  cefTRIAXone (ROCEPHIN) 1 g in sodium chloride  0.9 % 100 mL IVPB        1 g 200 mL/hr over 30 Minutes Intravenous Every 24 hours 02/04/24 0937 02/09/24 1029         Objective:   Vitals:   02/05/24 0200 02/05/24 0300 02/05/24 0400 02/05/24 0500  BP: (!) 164/89 (!) 156/84 (!) 143/79 (!) 158/85  Pulse: (!) 111 (!) 104 (!) 104 (!) 105  Resp: 16 11 16 15   Temp:   98.4 F (36.9 C)   TempSrc:   Axillary   SpO2: 91% 94% 94% 90%  Weight:    88 kg  Height:        Wt Readings from Last 3 Encounters:  02/05/24 88 kg  09/06/23 94.8 kg  07/11/22 103.4 kg     Intake/Output Summary (Last 24 hours) at 02/05/2024 1322  Last data filed at 02/05/2024 0549 Gross per 24 hour  Intake 460 ml  Output 500 ml  Net -40 ml     Physical Exam  Large, but open his eyes, follows simple commands Symmetrical Chest wall movement, Good air movement bilaterally, CTAB Tachycardic,No Gallops,Rubs or new Murmurs, No Parasternal Heave +ve B.Sounds, Abd Soft, No tenderness, No rebound - guarding or rigidity. No Cyanosis, Clubbing or edema, No new Rash or bruise     RN pressure injury documentation: Pressure Injury 01/31/24 Coccyx Mid Deep Tissue Pressure Injury - Purple or maroon localized area of discolored intact skin or blood-filled blister due to damage of underlying soft tissue from pressure and/or shear. (Active)  01/31/24 0100  Location: Coccyx  Location Orientation: Mid  Staging: Deep Tissue Pressure Injury - Purple or maroon localized area of discolored  intact skin or blood-filled blister due to damage of underlying soft tissue from pressure and/or shear.  Wound Description (Comments):   Present on Admission: Yes  Dressing Type Foam - Lift dressing to assess site every shift 02/04/24 1945      Data Review:    Recent Labs  Lab 02/01/24 0644 02/02/24 0549 02/03/24 0445 02/04/24 0436 02/05/24 0432  WBC 13.2* 14.7* 15.2* 16.5* 14.6*  HGB 10.0* 10.0* 10.0* 10.6* 10.5*  HCT 27.8* 27.8* 27.5* 29.7* 29.9*  PLT 147* 144* 157 162 195  MCV 85.8 86.6 87.9 86.8 87.9  MCH 30.9 31.2 31.9 31.0 30.9  MCHC 36.0 36.0 36.4* 35.7 35.1  RDW 14.3 14.6 15.7* 15.3 15.9*  LYMPHSABS  --   --   --  3.2 3.2  MONOABS  --   --   --  2.5* 2.1*  EOSABS  --   --   --  0.0 0.0  BASOSABS  --   --   --  0.0 0.0    Recent Labs  Lab 01/30/24 1952 01/30/24 1952 01/30/24 2020 01/30/24 2032 01/31/24 0528 02/01/24 0644 02/01/24 1351 02/02/24 0148 02/02/24 0549 02/03/24 0445 02/03/24 0837 02/04/24 0436 02/05/24 0432 02/05/24 0857  NA 132*  --   --   --  127* 126*   < > 124* 126* 127*  --  128* 131*  --   K 3.3*  --   --   --  3.3* 3.0*  --   --  3.7 3.8  --  3.3* 3.8  --   CL 96*  --   --   --  88* 89*  --   --  92* 93*  --  91* 97*  --   CO2 26  --   --   --  26 24  --   --  24 22  --  26 23  --   ANIONGAP 10  --   --   --  13 13  --   --  10 12  --  11 11  --   GLUCOSE 134*  --   --   --  115* 189*  --   --  153* 156*  --  144* 151*  --   BUN 20  --   --   --  14 22  --   --  23 23  --  22 21  --   CREATININE 1.03  --   --   --  0.88 0.74  --   --  0.58* 0.64  --  0.65 0.65  --   AST 22  --   --   --  24  --   --   --   --   --   --  36 50*  --   ALT 11  --   --   --  14  --   --   --   --   --   --  32 40  --   ALKPHOS 34*  --   --   --  44  --   --   --   --   --   --  47 53  --   BILITOT 1.2  --   --   --  1.3*  --   --   --   --   --   --  0.9 0.7  --   ALBUMIN 3.4*  --   --   --  4.0  --   --   --   --   --   --  3.5 3.4*  --   CRP  --   --    --   --   --   --   --   --   --   --  1.0* 1.7* 1.9*  --   PROCALCITON  --   --   --   --   --   --   --   --   --   --  0.38 0.14 0.10  --   LATICACIDVEN  --   --  2.1*  --   --   --   --   --   --   --   --   --   --   --   INR  --   --   --  1.1 1.0  --   --   --   --   --   --   --   --  1.0  TSH  --   --   --  48.029*  --   --   --   --   --  30.797*  --   --   --   --   HGBA1C  --   --   --   --  6.1*  --   --   --   --   --   --   --   --   --   AMMONIA  --   --   --  19  --   --   --   --   --   --   --   --   --   --   BNP  --   --   --   --   --   --   --   --   --  177.1*  --   --   --   --   MG  --    < >  --  1.5* 1.6* 2.2  --   --  2.0 2.1  --  2.1 2.0  --   PHOS  --    < >  --   --  3.0 2.2*  --   --  2.1* 2.7  --  3.1 3.5  --   CALCIUM  8.5*  --   --   --  9.0 9.0  --   --  9.0 8.8*  --  9.2 9.1  --    < > = values in this interval not displayed.  Recent Labs  Lab 01/30/24 1952 01/30/24 2020 01/30/24 2032 01/31/24 7829 02/01/24 5621 02/02/24 0549 02/03/24 0445 02/03/24 3086 02/04/24 0436 02/05/24 0432 02/05/24 0857  CRP  --   --   --   --   --   --   --  1.0* 1.7* 1.9*  --   PROCALCITON  --   --   --   --   --   --   --  0.38 0.14 0.10  --   LATICACIDVEN  --  2.1*  --   --   --   --   --   --   --   --   --   INR  --   --  1.1 1.0  --   --   --   --   --   --  1.0  TSH  --   --  48.029*  --   --   --  30.797*  --   --   --   --   HGBA1C  --   --   --  6.1*  --   --   --   --   --   --   --   AMMONIA  --   --  19  --   --   --   --   --   --   --   --   BNP  --   --   --   --   --   --  177.1*  --   --   --   --   MG   < >  --  1.5* 1.6* 2.2 2.0 2.1  --  2.1 2.0  --   CALCIUM   --   --   --  9.0 9.0 9.0 8.8*  --  9.2 9.1  --    < > = values in this interval not displayed.    --------------------------------------------------------------------------------------------------------------- No results found for: "CHOL", "HDL", "LDLCALC", "LDLDIRECT", "TRIG",  "CHOLHDL"  Lab Results  Component Value Date   HGBA1C 6.1 (H) 01/31/2024   Recent Labs    02/03/24 0445  TSH 30.797*  FREET4 0.31*      Micro Results Recent Results (from the past 240 hours)  Resp panel by RT-PCR (RSV, Flu A&B, Covid) Anterior Nasal Swab     Status: None   Collection Time: 01/30/24  8:10 PM   Specimen: Anterior Nasal Swab  Result Value Ref Range Status   SARS Coronavirus 2 by RT PCR NEGATIVE NEGATIVE Final   Influenza A by PCR NEGATIVE NEGATIVE Final   Influenza B by PCR NEGATIVE NEGATIVE Final    Comment: (NOTE) The Xpert Xpress SARS-CoV-2/FLU/RSV plus assay is intended as an aid in the diagnosis of influenza from Nasopharyngeal swab specimens and should not be used as a sole basis for treatment. Nasal washings and aspirates are unacceptable for Xpert Xpress SARS-CoV-2/FLU/RSV testing.  Fact Sheet for Patients: BloggerCourse.com  Fact Sheet for Healthcare Providers: SeriousBroker.it  This test is not yet approved or cleared by the [place]  FDA and has been authorized for detection and/or diagnosis of SARS-CoV-2 by FDA under an Emergency Use Authorization (EUA). This EUA will remain in effect (meaning this test can be used) for the duration of the COVID-19 declaration under Section 564(b)(1) of the Act, 21 U.S.C. section 360bbb-3(b)(1), unless the authorization is terminated or revoked.     Resp Syncytial Virus by PCR NEGATIVE NEGATIVE Final    Comment: (  NOTE) Fact Sheet for Patients: BloggerCourse.com  Fact Sheet for Healthcare Providers: SeriousBroker.it  This test is not yet approved or cleared by the [place]  FDA and has been authorized for detection and/or diagnosis of SARS-CoV-2 by FDA under an Emergency Use Authorization (EUA). This EUA will remain in effect (meaning this test can be used) for the duration of the COVID-19  declaration under Section 564(b)(1) of the Act, 21 U.S.C. section 360bbb-3(b)(1), unless the authorization is terminated or revoked.  Performed at [facility], [address] [place], [place] [address]   Blood culture (routine x 2)     Status: None   Collection Time: 01/30/24  8:32 PM   Specimen: BLOOD RIGHT FOREARM  Result Value Ref Range Status   Specimen Description BLOOD RIGHT FOREARM  Final   Special Requests   Final    BOTTLES DRAWN AEROBIC AND ANAEROBIC Blood Culture results may not be optimal due to an inadequate volume of blood received in culture bottles   Culture   Final    NO GROWTH 5 DAYS Performed at [facility], [address], [place], [place] [address]    Report Status 02/04/2024 FINAL  Final  Blood culture (routine x 2)     Status: None   Collection Time: 01/30/24  8:40 PM   Specimen: BLOOD  Result Value Ref Range Status   Specimen Description BLOOD RIGHT ANTECUBITAL  Final   Special Requests   Final    BOTTLES DRAWN AEROBIC AND ANAEROBIC Blood Culture adequate volume   Culture   Final    NO GROWTH 5 DAYS Performed at [facility], [address], [place], [place] [address]    Report Status 02/04/2024 FINAL  Final  MRSA Next Gen by PCR, Nasal     Status: None   Collection Time: 01/31/24  1:03 AM   Specimen: Nasal Mucosa; Nasal Swab  Result Value Ref Range Status   MRSA by PCR Next Gen NOT DETECTED NOT DETECTED Final    Comment: (NOTE) The GeneXpert MRSA Assay (FDA approved for NASAL specimens only), is one component of a comprehensive MRSA colonization surveillance program. It is not intended to diagnose MRSA infection nor to guide or monitor treatment for MRSA infections. Test performance is not FDA approved in patients less than 2 years old. Performed at [facility], [address], [place], [place] [address]   Urine Culture     Status: Abnormal   Collection Time: 02/03/24  8:22 AM   Specimen: Urine, Random  Result Value  Ref Range Status   Specimen Description URINE, RANDOM  Final   Special Requests   Final    NONE Reflexed from [id number] Performed at [facility], [address], [place], [place] [address]    Culture >=100,000 COLONIES/mL ESCHERICHIA COLI (A)  Final   Report Status 02/05/2024 FINAL  Final   Organism ID, Bacteria ESCHERICHIA COLI (A)  Final      Susceptibility   Escherichia coli - MIC*    AMPICILLIN <=2 SENSITIVE Sensitive     CEFAZOLIN <=4 SENSITIVE Sensitive     CEFEPIME <=0.12 SENSITIVE Sensitive     CEFTRIAXONE <=0.25 SENSITIVE Sensitive     CIPROFLOXACIN  <=0.25 SENSITIVE Sensitive     GENTAMICIN <=1 SENSITIVE Sensitive     IMIPENEM <=0.25 SENSITIVE Sensitive     NITROFURANTOIN <=16 SENSITIVE Sensitive     TRIMETH /SULFA  <=20 SENSITIVE Sensitive     AMPICILLIN/SULBACTAM <=2 SENSITIVE Sensitive  PIP/TAZO <=4 SENSITIVE Sensitive ug/mL    * >=100,000 COLONIES/mL ESCHERICHIA COLI    Radiology Report US  EKG SITE RITE Result Date: 02/05/2024 If Site Rite image not attached, placement could not be confirmed due to current cardiac rhythm.  Overnight EEG with video Result Date: 02/05/2024 Arleene Lack, MD     02/05/2024  8:52 AM Patient Name: Kevontay Voliva MRN: [id number] Epilepsy Attending: Arleene Lack Referring Physician/Provider: Arleene Lack, MD Duration: 02/04/2024 1724 to 02/05/2024 0845 Patient history: 63yo M with right sub dural hematoma. EEG to evaluate for seizure  Level of alertness: Awake/lethargic  AEDs during EEG study: VPA, Xanax , LCM  Technical aspects: This EEG study was done with scalp electrodes positioned according to the 10-20 International system of electrode placement. Electrical activity was reviewed with band pass filter of 1-70Hz , sensitivity of 7 uV/mm, display speed of 41mm/sec with a 60Hz  notched filter applied as appropriate. EEG data were recorded continuously and digitally stored.  Video monitoring was available and reviewed as appropriate.   Description: EEG showed continuous generalized and lateralized right hemisphere 3 to 7 Hz theta-delta slowing. Rhythmic sharply contoured 3-4hz  theta-delta slowing was noted in right hemisphere with waxing and waning morphology. Hyperventilation and photic stimulation were not performed.   Seizures without clinical signs were noted in right posterior quadrant. During seizure, eeg showed lateralized periodic discharges at 1.5-2hz  in right hemisphere, maximal right posterior quadrant which then evolved into 2.5-3hz  delta slowing admixed with spikes and involved left hemisphere. Average 3-6 seizures were noted per hour, lasting about 2 minutes each  ABNORMALITY - Seizures without clinical signs, right posterior quadrant. - Lateralized rhythmic delta slowing, right hemisphere ( LRDA) - Continuous slow, generalized and lateralized right hemisphere  IMPRESSION: This study showed seizures without clinical signs arising from  right posterior quadrant. Average 3-6 seizures were noted per hour, lasting about 2 minutes each Additionally there was cortical dysfunction arising from right hemisphere likely secondary to underlying structural abnormality/ subdural hematoma. Lastly there was moderate diffuse encephalopathy.   Arleene Lack   EEG adult Result Date: 02/04/2024 Arleene Lack, MD     02/04/2024  4:44 PM Patient Name: Nerick Hillson MRN: [id number] Epilepsy Attending: Arleene Lack Referring Physician/Provider: Cala Castleman, MD Date : 02/04/2024 Duration: 22.53 mins Patient history: 63yo M with right sub dural hematoma. EEG to evaluate for seizure Level of alertness: Awake AEDs during EEG study: VPA, Xanax  Technical aspects: This EEG study was done with scalp electrodes positioned according to the 10-20 International system of electrode placement. Electrical activity was reviewed with band pass filter of 1-70Hz , sensitivity of 7 uV/mm, display speed of 46mm/sec with a 60Hz  notched filter applied as  appropriate. EEG data were recorded continuously and digitally stored.  Video monitoring was available and reviewed as appropriate. Description: EEG showed continuous generalized and lateralized right hemisphere 3 to 7 Hz theta-delta slowing. Rhythmic sharply contoured 3-4hz  theta-delta slowing was noted in right hemisphere with waxing and waning morphology. Hyperventilation and photic stimulation were not performed.   ABNORMALITY - Lateralized rhythmic delta slowing, right hemisphere ( LRDA) - Continuous slow, generalized and lateralized right hemisphere IMPRESSION: This study is suggestive of cortical dysfunction arising from right hemisphere likely secondary to underlying structural abnormality/ subdural hematoma. This EEG pattern is on the ictal-interictal continuum with higher potential for seizure recurrence. Consider long term eeg monitoring if concern for ictal-interictal activity persists. Additionally there is moderate diffuse encephalopathy. No definite seizures were seen throughout the recording. Priyanka O  Yadav   CT HEAD WO CONTRAST ( ) Addendum Date: 02/04/2024 ADDENDUM REPORT: 02/04/2024 09:50 ADDENDUM: Study discussed by telephone with Dr. Lynnwood Sauer on 02/04/2024 at 0906 hours. Electronically Signed   By: Marlise Simpers M.D.   On: 02/04/2024 09:50   Result Date: 02/04/2024 CLINICAL DATA:  63 year old male with altered mental status and subdural hematoma after a fall earlier this month. EXAM: CT HEAD WITHOUT CONTRAST TECHNIQUE: Contiguous axial images were obtained from the base of the skull through the vertex without intravenous contrast. RADIATION DOSE REDUCTION: This exam was performed according to the departmental dose-optimization program which includes automated exposure control, adjustment of the mA and/or kV according to patient size and/or use of iterative reconstruction technique. COMPARISON:  Head CTs 01/31/2024 and earlier. FINDINGS: Brain: Hyperdense right side hemispheric subdural  hematoma has increased along with intracranial mass effect since 01/31/2024. On coronal images the SDH is now up to 11 mm in thickness (coronal image 38) versus about 7 mm at the same level previously. Increased mass effect on the right lateral ventricle. Increased leftward midline shift from trace now. No uncal herniation at this time and basilar cisterns remain patent. Contralateral smaller left side subdural hematoma appears largely resolved since 01/31/2024. A small volume of layering subarachnoid hemorrhage in the left sylvian fissure is increased on series 4, image 17. No ventriculomegaly. Small volume layering occipital horn intraventricular blood is stable. Lobulated hyperdense SDH along the right tentorium also mildly increased. Additional IVH versus linear hemorrhagic contusion at the junction of the right temporal and occipital lobes on series 4, image 12 is now apparent. No superimposed acute or chronic cortically based infarct identified. Stable gray-white differentiation. No cortically based acute infarct identified. Vascular: No suspicious intracranial vascular hyperdensity. Skull: Appears stable and intact. Sinuses/Orbits: Left nasoenteric tube remains in place. Visualized paranasal sinuses and mastoids are stable and well aerated. Other: Mild Disconjugate gaze is similar. Stable scalp soft tissues, no measurable scalp hematoma. IMPRESSION: 1. Largely resolved left side but Increased Right Side Subdural Hematoma since 01/31/2024. Right SDH now up to 11 mm with increased leftward midline shift, (now 4-5 mm) and increased mass effect on the right lateral ventricle. 2. Small volume SAH now apparent. Stable small volume IVH. No acute ventriculomegaly. Possible small superimposed right posterior Emma sphere hemorrhagic contusion. 3. No skull fracture identified. Electronically Signed: By: Marlise Simpers M.D. On: 02/04/2024 09:00   DG Chest Port 1 View Result Date: 02/04/2024 CLINICAL DATA:  Shortness of  breath. EXAM: PORTABLE CHEST 1 VIEW COMPARISON:  02/03/2024 FINDINGS: Low volume film. Right lung clear. Streaky density seen at the left base has become more patchy in the interval. Cardiopericardial silhouette is at upper limits of normal for size. NG tube passes into the stomach with tip appearing to be positioned in the region of the gastric fundus. Telemetry leads overlie the chest. IMPRESSION: Low volume film with increased patchy opacity at the left base compatible with atelectasis or infiltrate. Electronically Signed   By: Donnal Fusi M.D.   On: 02/04/2024 07:10    .rad Signature  Kelleen Patee Terrez Ander M.D on 02/05/2024 at 1:22 PM   -  To page go to [email or web address]

## 2024-02-05 NOTE — TOC Progression Note (Signed)
 Transition of Care [facility]) - Progression Note    Patient Details  Name: [name] MRN: [id number] Date of Birth: 11/05/1960  Transition of Care [facility]) CM/SW Contact  Jannice Mends, LCSW Phone Number: 02/05/2024, 9:13 AM  Clinical Narrative:    CSW uploaded requested documents for [place]; still pending.    Expected Discharge Plan: Skilled Nursing Facility Barriers to Discharge: Awaiting State Approval (PASRR), Continued Medical Work up, SNF Pending bed offer  Expected Discharge Plan and Services In-house Referral: Clinical Social Work   Post Acute Care Choice: Skilled Nursing Facility Living arrangements for the past 2 months: Single Family Home                                       Social Determinants of Health (SDOH) Interventions SDOH Screenings   Food Insecurity: Patient Unable To Answer (02/02/2024)  Housing: Patient Unable To Answer (02/02/2024)  Transportation Needs: Patient Unable To Answer (02/02/2024)  Utilities: Patient Unable To Answer (02/02/2024)  Financial Resource Strain: Low Risk  (12/31/2022)   Received from [facility]  Physical Activity: Unknown (03/27/2023)   Received from [facility]  Social Connections: Somewhat Isolated (03/27/2023)   Received from [facility]  Stress: Stress Concern Present (03/27/2023)   Received from [facility]  Tobacco Use: High Risk (01/30/2024)    Readmission Risk Interventions     No data to display

## 2024-02-05 NOTE — Progress Notes (Signed)
 Nutrition Brief Note  Pt transfer to 5W on evening of 5/11.He dislodged Cortak on 5/10. Replaced with NGT and had been receiving TF regimen until 5/13, at which time NGT was removed as diet advanced to D1/thins with full supervision. Patient more lethargic today. Cortrak replaced. Will remain NPO as he may need surgery. Will re-initiate tube feed when appropriate. Was tolerating previous regimen. Recommendations are below.  INTERVENTION:    When appropriate, re-initiate tube feeding via Cortrak: Osmolite 1.5 at 55 ml/hr (1320 ml per day) Prosource TF20 60 ml BID  Provides 2140 kcal, 124 gm protein, 1003 ml free water daily  MVI with minerals daily    NUTRITION DIAGNOSIS:  Inadequate oral intake related to lethargy/confusion as evidenced by NPO status. - remains applicable   GOAL:  Patient will meet greater than or equal to 90% of their needs - to be met via TF   MONITOR:    TF tolerance, Diet advancement  Con Decant MS, RD, LDN Registered Dietitian Clinical Nutrition RD Inpatient Contact Info in Amion

## 2024-02-05 NOTE — Progress Notes (Signed)
 Peripherally Inserted Central Catheter Placement  The IV Nurse has discussed with the patient and/or persons authorized to consent for the patient, the purpose of this procedure and the potential benefits and risks involved with this procedure.  The benefits include less needle sticks, lab draws from the catheter, and the patient may be discharged home with the catheter. Risks include, but not limited to, infection, bleeding, blood clot (thrombus formation), and puncture of an artery; nerve damage and irregular heartbeat and possibility to perform a PICC exchange if needed/ordered by physician.  Alternatives to this procedure were also discussed.  Bard Power PICC patient education guide, fact sheet on infection prevention and patient information card has been provided to patient /or left at bedside.   Consent obtained with wife via telephone  PICC Placement Documentation  PICC Double Lumen 02/05/24 Right Basilic 45 cm 0 cm (Active)  Indication for Insertion or Continuance of Line Vasoactive infusions 02/05/24 1700  Exposed Catheter (cm) 0 cm 02/05/24 1700  Site Assessment Clean, Dry, Intact 02/05/24 1700  Lumen #1 Status Flushed;Saline locked;Blood return noted 02/05/24 1700  Lumen #2 Status Flushed;Saline locked;Blood return noted 02/05/24 1700  Dressing Type Transparent;Securing device 02/05/24 1700  Dressing Status Antimicrobial disc/dressing in place;Clean, Dry, Intact 02/05/24 1700  Line Care Connections checked and tightened 02/05/24 1700  Line Adjustment (NICU/IV Team Only) No 02/05/24 1700  Dressing Intervention New dressing;Adhesive placed at insertion site (IV team only) 02/05/24 1700  Dressing Change Due 02/12/24 02/05/24 1700       Dru Georges 02/05/2024, 5:22 PM

## 2024-02-05 NOTE — Progress Notes (Signed)
 LTM maint complete - no skin breakdown under: A1,A2,CZ

## 2024-02-05 NOTE — Progress Notes (Signed)
 NAME:  [name], MRN:  [id number], DOB:  Jan 09, 1961, LOS: 6 ADMISSION DATE:  01/30/2024, CONSULTATION DATE:  02/05/2024  REFERRING MD:  [facility], EDP, CHIEF COMPLAINT:  fall, hypothermia   History of Present Illness:  63 year old man who was brought in by EMS from home after a fall.  Apparently wife called, patient is currently ANO x 1 and history obtained after chart review, unable to reach wife.  His heart rate initially was in the 80s, A/O x 1 per EMS, bradycardic to 40s in the ED , noted to be hypothermic 94.  Bruises of multiple ages left hip left eye and left shoulder Labs significant for no leukocytosis, hemoglobin 9.3, mild thrombocytopenia 1 27K, mild hypokalemia 3.3, hypomagnesemia 1.5, normal LFTs.  UDS positive for benzodiazepines, tox screen negative, ammonia 19, TSH 48.  EKG showed sinus bradycardia He was given 1 g of magnesium , 100 mg of IV Solu-Cortef  and 200 mics of levothyroxine  IV Head CT showed 9 mm right subdural hematoma with 3 mm midline shift and small volume acute SAH, cervical spine was negative. PCCM consulted for admission 02/05/2024 pulmonary critical care reconsulted for increased seizure activity and concern that his respiratory status may worsen with phenobarbital.  Pertinent  Medical History  Seizure disorder COPD  Significant Hospital Events: Including procedures, antibiotic start and stop dates in addition to other pertinent events   5/8 Admitted 5/9 No overnight issues, patient remained confused and restless, trying to get out of bed 5/10 ongoing issues with agitation overnight  Interim History / Subjective:  Worsening seizures.  Evaluated by neurosurgery.  Phenobarbital has been started by neurology.  He may require transfer back to intensive care unit should he become unable to protect his airway  Objective    Currently in bed arousable follows simple commands grips bilaterally samuraciclib) No lymphadenopathy is appreciated Heart sounds are  regular Decreased breath sounds throughout sats 98% liters Abdomen soft nontender feeding tube has been removed. Lower extremities with edema  Blood pressure (!) 143/79, pulse (!) 104, temperature 98.4 F (36.9 C), temperature source Axillary, resp. rate 16, height 5\' 6"  (1.676 m), weight 88 kg, SpO2 94%.        Intake/Output Summary (Last 24 hours) at 02/05/2024 7829 Last data filed at 02/05/2024 0549 Gross per 24 hour  Intake 460 ml  Output 500 ml  Net -40 ml   Filed Weights   02/02/24 0500 02/03/24 0430 02/05/24 0500  Weight: 89.6 kg 91.1 kg 88 kg    Resolved Hospital Problem list     Assessment & Plan:  Acute right subdural hematoma with 3 mm midline shift Acute traumatic subarachnoid hemorrhage status post fall Acute encephalopathy in the setting of right subdural hematoma -Repeat head CT 5/9 with slightly increased size of right larger than left subdural hematomas with 3 mm leftward midline shift Seizure disorder history P: Per neurology Increasing seizure activity 02/05/2024 Increases his antiepileptics Phenobarbital is being added 02/05/2024 He may need treatment with.  For further evaluation treatment should his pulmonary status worsen phenobarbital. 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, noncompliant with meds -TSH on admission 48.029 with free T3 less than 0.3 and T4 0.30 P: Continue   Bradycardia/hypothermia due to hypothyroidism P: Currently hemodynamically stable  Prediabetes CBG (last 3)  Recent Labs    02/04/24 2337 02/05/24 0417 02/05/24 0753  GLUCAP 181* 160* 157*    P: Sliding-scale insulin  every 4 hours  Anxiety disorder on chronic benzodiazepine -It appears patient is on  high-dose benzodiazepines upwards of 4 mg of Xanax  nightly P: Anxiolytics as needed currently able to decrease level of consciousness and being placed on phenobarbital therefore monitor closely  COPD, not in exacerbation Tobacco  dependence P: Oxygen therapy as needed Patient is started on phenobarbital for seizures and may require transfer to ICU for airway monitoring Continue bronchodilators    Best Practice (right click and "Reselect all SmartList Selections" daily)   Diet/type: NPO if failed swallow evaluation, place cortrak DVT prophylaxis SCD Pressure ulcer(s): N/A GI prophylaxis: PPI Lines: N/A Foley:  N/A Code Status:  full code Last date of multidisciplinary goals of care discussion: 5/9: Patient's wife was updated over the phone, decision was to continue full scope of care [name] [name] ACNP Acute Care Nurse Practitioner [name] Pulmonary/Critical Care Please consult Amion 02/05/2024, 9:37 AM

## 2024-02-05 NOTE — Plan of Care (Signed)
 Pt has rested quietly throughout the night with no distress noted. Alert and oriented. On room air. SR/ST on the monitor. Purewick intact to suction. Continuous EEG going. Pt taking crushed meds with applesauce and drinking without difficulty. No complaints voiced.     Problem: Coping: Goal: Ability to adjust to condition or change in health will improve Outcome: Progressing   Problem: Metabolic: Goal: Ability to maintain appropriate glucose levels will improve Outcome: Progressing   Problem: Nutritional: Goal: Maintenance of adequate nutrition will improve Outcome: Progressing   Problem: Tissue Perfusion: Goal: Adequacy of tissue perfusion will improve Outcome: Progressing   Problem: Clinical Measurements: Goal: Respiratory complications will improve Outcome: Progressing Goal: Cardiovascular complication will be avoided Outcome: Progressing   Problem: Pain Managment: Goal: General experience of comfort will improve and/or be controlled Outcome: Progressing

## 2024-02-05 NOTE — Progress Notes (Signed)
 NEUROLOGY CONSULT FOLLOW UP NOTE   Date of service: Feb 05, 2024 Patient Name: [name] MRN:  [id number] DOB:  04-19-1961  Interval Hx/subjective   Seen and examined. Following commands, slightly more lethargic today. Eyes deviated up and not able to track horizontally.   Vitals   Vitals:   02/05/24 0000 02/05/24 0100 02/05/24 0400 02/05/24 0500  BP:   (!) 143/79   Pulse:   (!) 104   Resp: 18 18 16    Temp:   98.4 F (36.9 C)   TempSrc:   Axillary   SpO2:   94%   Weight:    88 kg  Height:         Body mass index is 31.31 kg/m.  Physical Exam  General: Drowsy, does not open eyes to voice but turn head to voice HEENT: Bruising on the left eye and forehead CVS: tachycardic Neurologic exam Drowsy, does not open eyes to voice but was able to follow commands. Less verbal output than yesterday More somnolent than yesterday Cranial nerves: Pupils are equal round reactive to light, extraocular movements difficult to assess but gaze is midline, eyes slightly upward. left eyelid has a lot of swelling, facial sensation and symmetry seem preserved Motor examination reveals diminished left upper extremity strength but that is due to shoulder pain and has been baseline for him.  Right upper extremity appears full strength.  Not lifting his bilateral lower extremities against gravity today.  Withdraws to noxious admission Sensory exam: As above  Medications  Current Facility-Administered Medications:    0.9 %  sodium chloride  infusion, , Intravenous, Continuous, Elgergawy, Ardia Kraft, MD   ALPRAZolam  (XANAX ) tablet 0.5 mg, 0.5 mg, Oral, BID, Young Hensen, RPH   arformoterol  (BROVANA ) nebulizer solution 15 mcg, 15 mcg, Nebulization, BID, Reese, Stephanie M, PA-C, 15 mcg at 02/05/24 0804   budesonide  (PULMICORT ) nebulizer solution 0.25 mg, 0.25 mg, Nebulization, BID, Shelli Dexter M, PA-C, 0.25 mg at 02/05/24 0802   cefTRIAXone (ROCEPHIN) 1 g in sodium chloride  0.9 % 100 mL IVPB, 1  g, Intravenous, Q24H, Cala Castleman, MD, Last Rate: 200 mL/hr at 02/04/24 1029, 1 g at 02/04/24 1029   Chlorhexidine  Gluconate Cloth 2 % PADS 6 each, 6 each, Topical, Daily, Alva, Rakesh V, MD, 6 each at 02/04/24 1031   docusate sodium  (COLACE) capsule 100 mg, 100 mg, Oral, BID PRN, Shelli Dexter M, PA-C   haloperidol lactate (HALDOL) injection 2 mg, 2 mg, Intramuscular, Q6H PRN, Singh, Prashant K, MD   hydrALAZINE (APRESOLINE) injection 10 mg, 10 mg, Intravenous, Q4H PRN, Ogan, Okoronkwo U, MD, 10 mg at 02/04/24 2323   hydrALAZINE (APRESOLINE) injection 5 mg, 5 mg, Intravenous, Q4H PRN, Elgergawy, Dawood S, MD   hydrALAZINE (APRESOLINE) tablet 50 mg, 50 mg, Oral, Q8H, Young Hensen, RPH   insulin  aspart (novoLOG ) injection 0-9 Units, 0-9 Units, Subcutaneous, Q4H, Reese, Stephanie M, PA-C, 2 Units at 02/05/24 0430   labetalol (NORMODYNE) injection 10 mg, 10 mg, Intravenous, Q2H PRN, Paliwal, Aditya, MD, 10 mg at 02/01/24 2047   lacosamide (VIMPAT) 100 mg in sodium chloride  0.9 % 25 mL IVPB, 100 mg, Intravenous, Q12H, Augustin Leber, MD   [START ON 02/06/2024] levothyroxine  (SYNTHROID ) tablet 150 mcg, 150 mcg, Oral, Q0600, Young Hensen, RPH   multivitamin with minerals tablet 1 tablet, 1 tablet, Oral, Daily, Young Hensen, RPH   nicotine (NICODERM CQ - dosed in mg/24 hours) patch 14 mg, 14 mg, Transdermal, Daily, Ogan, Okoronkwo U, MD, 14 mg at 02/04/24 1029  Oral care mouth rinse, 15 mL, Mouth Rinse, 4 times per day, Trevor Fudge, MD, 15 mL at 02/04/24 2140   Oral care mouth rinse, 15 mL, Mouth Rinse, PRN, Trevor Fudge, MD, 15 mL at 02/02/24 0529   pantoprazole  (PROTONIX ) injection 40 mg, 40 mg, Intravenous, QHS, Shelli Dexter M, PA-C, 40 mg at 02/04/24 2139   polyethylene glycol (MIRALAX  / GLYCOLAX ) packet 17 g, 17 g, Oral, Daily PRN, Shelli Dexter M, PA-C   revefenacin  (YUPELRI ) nebulizer solution 175 mcg, 175 mcg, Nebulization, Daily, Shelli Dexter M, PA-C, 175 mcg at  02/05/24 1610   sodium chloride  tablet 1 g, 1 g, Oral, BID, Young Hensen, RPH   thiamine (VITAMIN B1) tablet 100 mg, 100 mg, Oral, Daily, Young Hensen, RPH   traMADol  (ULTRAM ) tablet 50 mg, 50 mg, Oral, Q6H PRN, Young Hensen, RPH   valproic acid  (DEPAKENE ) 250 MG/5ML solution 500 mg, 500 mg, Oral, BID, Young Hensen, [facility]  Labs and Diagnostic Imaging   CBC:  Recent Labs  Lab 02/04/24 0436 02/05/24 0432  WBC 16.5* 14.6*  NEUTROABS 10.7* 9.1*  HGB 10.6* 10.5*  HCT 29.7* 29.9*  MCV 86.8 87.9  PLT 162 195    Basic Metabolic Panel:  Lab Results  Component Value Date   NA 131 (L) 02/05/2024   K 3.8 02/05/2024   CO2 23 02/05/2024   GLUCOSE 151 (H) 02/05/2024   BUN 21 02/05/2024   CREATININE 0.65 02/05/2024   CALCIUM  9.1 02/05/2024   GFRNONAA >60 02/05/2024   GFRAA >60 04/19/2019   Lipid Panel: No results found for: "LDLCALC" HgbA1c:  Lab Results  Component Value Date   HGBA1C 6.1 (H) 01/31/2024   Urine Drug Screen:     Component Value Date/Time   LABOPIA NONE DETECTED 01/30/2024 2003   COCAINSCRNUR NONE DETECTED 01/30/2024 2003   LABBENZ POSITIVE (A) 01/30/2024 2003   AMPHETMU NONE DETECTED 01/30/2024 2003   THCU NONE DETECTED 01/30/2024 2003   LABBARB NONE DETECTED 01/30/2024 2003    Alcohol Level     Component Value Date/Time   [facility] <15 01/30/2024 2032   INR  Lab Results  Component Value Date   INR 1.0 01/31/2024   APTT No results found for: "APTT" AED levels:  Lab Results  Component Value Date   LEVETIRACETA <2.0 (L) 01/30/2024    CT Head without contrast(Personally reviewed): 1. Acute 9 mm thick right cerebral convexity subdural hemorrhage which extends along the right tentorial leaflet. Resulting 3 mm leftward midline shift. 2. Scattered small volume of acute subarachnoid hemorrhage. 3. No evidence of acute fracture or traumatic malalignment in the cervical spine.  Repeat CT head 5/13 1. Largely resolved left side but Increased Right Side  Subdural Hematoma since 01/31/2024. Right SDH now up to 11 mm with increased leftward midline shift, (now 4-5 mm) and increased mass effect on the right lateral ventricle. 2. Small volume SAH now apparent. Stable small volume IVH. No acute ventriculomegaly. Possible small superimposed right posterior hemisphere hemorrhagic contusion. 3. No skull fracture identified.   rEEG: 02/04/2024  This study is suggestive of cortical dysfunction arising from right hemisphere likely secondary to underlying structural abnormality/ subdural hematoma. This EEG pattern is on the ictal-interictal continuum with higher potential for seizure recurrence. Consider long term eeg monitoring if concern for ictal-interictal activity persists.   cEEG: 02/04/2024 1724 to 02/05/2024 0845  ABNORMALITY - Seizures without clinical signs, right posterior quadrant.  - Lateralized rhythmic delta slowing, right hemisphere ( LRDA) - Continuous slow, generalized and lateralized right  hemisphere IMPRESSION: This study showed seizures without clinical signs arising from  right posterior quadrant. Average 3-6 seizures were noted per hour, lasting about 2 minutes each. Additionally there was cortical dysfunction arising from right hemisphere likely secondary to underlying structural abnormality/ subdural hematoma. Lastly there was moderate diffuse encephalopathy.      Assessment   [name] is a 63 y.o. male past history of COPD, anxiety, hypothyroidism, diabetes, hypertension, lipidemia and seizure disorder admitted for multiple falls and found to have right greater than left subdural hematomas with a leftward midline shift which has worsened over the past few days.  Neurology was consulted for worsening mentation.  Initial EEG yesterday did not show any focality but repeat EEG done today because of concern for worsening level of consciousness shows cortical dysfunction arising from the right hemisphere-EEG pattern ictal interictal  continuum with hypertension for seizure recurrence. His CT head also reveals worsening of the right-sided subdural and worsening right-to-left midline shift. He is currently on Depakote  500 mg twice daily and was loaded with lacosamide 200 mg with 100 mg twice daily.  Despite this his last 24-hour continuous EEG shows 3-6 seizures per hour each lasting approximately 2 minutes.  Neurosurgery reengaged-appreciate evaluation.   Impression: Breakthrough seizures in the setting of subdural hematoma, subdural hematoma  Recommendations  LTM EEG  Continue Depakote  500mg  twice daily Lacosamide 100mg  twice daily ->increase to 200mg  BID starting now Phenobarbital load followed by standing doses once levels are obtained. Appreciate neurosurgical consideration for hematoma evacuation We will continue to follow Plan discussed with Dr. Osborne Blazer  ----  D. Dela Favor, NP Neurology   Attending Neurohospitalist Addendum Patient seen and examined with APP/Resident. Agree with the history and physical as documented above. Agree with the plan as documented, which I helped formulate. I have independently reviewed the chart, obtained history, review of systems and examined the patient.I have personally reviewed pertinent head/neck/spine imaging (CT/MRI). Please feel free to call with any questions.  -- Tona Francis, MD Neurologist [facility] Pager: [phone]

## 2024-02-05 NOTE — Progress Notes (Signed)
 ABG sent to lab

## 2024-02-05 NOTE — Procedures (Signed)
 Cortrak  Person Inserting Tube:  Kalin Kyler T, RD Tube Type:  Cortrak - 43 inches Tube Size:  10 Tube Location:  Left nare Initial Placement:  Stomach Secured by: Bridle Technique Used to Measure Tube Placement:  Marking at nare/corner of mouth Cortrak Secured At:  65 cm   Cortrak Tube Team Note:  Consult received to place a Cortrak feeding tube.   No x-ray is required. RN may begin using tube.   If the tube becomes dislodged please keep the tube and contact the Cortrak team at [email or web address] for replacement.  If after hours and replacement cannot be delayed, place a NG tube and confirm placement with an abdominal x-ray.    Shelle Iron RD, LDN Contact via [employer].

## 2024-02-06 ENCOUNTER — Encounter (HOSPITAL_COMMUNITY): Payer: Medicare (Managed Care)

## 2024-02-06 DIAGNOSIS — J449 Chronic obstructive pulmonary disease, unspecified: Secondary | ICD-10-CM | POA: Diagnosis not present

## 2024-02-06 DIAGNOSIS — S065XAA Traumatic subdural hemorrhage with loss of consciousness status unknown, initial encounter: Secondary | ICD-10-CM | POA: Diagnosis not present

## 2024-02-06 DIAGNOSIS — R569 Unspecified convulsions: Secondary | ICD-10-CM | POA: Diagnosis not present

## 2024-02-06 DIAGNOSIS — F419 Anxiety disorder, unspecified: Secondary | ICD-10-CM | POA: Diagnosis not present

## 2024-02-06 DIAGNOSIS — R001 Bradycardia, unspecified: Secondary | ICD-10-CM | POA: Diagnosis not present

## 2024-02-06 LAB — CBC WITH DIFFERENTIAL/PLATELET
Abs Immature Granulocytes: 0.07 10*3/uL (ref 0.00–0.07)
Basophils Absolute: 0 10*3/uL (ref 0.0–0.1)
Basophils Relative: 0 %
Eosinophils Absolute: 0 10*3/uL (ref 0.0–0.5)
Eosinophils Relative: 0 %
HCT: 28.2 % — ABNORMAL LOW (ref 39.0–52.0)
Hemoglobin: 9.6 g/dL — ABNORMAL LOW (ref 13.0–17.0)
Immature Granulocytes: 1 %
Lymphocytes Relative: 27 %
Lymphs Abs: 2.9 10*3/uL (ref 0.7–4.0)
MCH: 31 pg (ref 26.0–34.0)
MCHC: 34 g/dL (ref 30.0–36.0)
MCV: 91 fL (ref 80.0–100.0)
Monocytes Absolute: 1.4 10*3/uL — ABNORMAL HIGH (ref 0.1–1.0)
Monocytes Relative: 14 %
Neutro Abs: 6.2 10*3/uL (ref 1.7–7.7)
Neutrophils Relative %: 58 %
Platelets: 189 10*3/uL (ref 150–400)
RBC: 3.1 MIL/uL — ABNORMAL LOW (ref 4.22–5.81)
RDW: 16.3 % — ABNORMAL HIGH (ref 11.5–15.5)
WBC: 10.6 10*3/uL — ABNORMAL HIGH (ref 4.0–10.5)
nRBC: 0 % (ref 0.0–0.2)

## 2024-02-06 LAB — COMPREHENSIVE METABOLIC PANEL WITH GFR
ALT: 34 U/L (ref 0–44)
AST: 31 U/L (ref 15–41)
Albumin: 3 g/dL — ABNORMAL LOW (ref 3.5–5.0)
Alkaline Phosphatase: 54 U/L (ref 38–126)
Anion gap: 11 (ref 5–15)
BUN: 22 mg/dL (ref 8–23)
CO2: 21 mmol/L — ABNORMAL LOW (ref 22–32)
Calcium: 8.4 mg/dL — ABNORMAL LOW (ref 8.9–10.3)
Chloride: 100 mmol/L (ref 98–111)
Creatinine, Ser: 0.68 mg/dL (ref 0.61–1.24)
GFR, Estimated: >60 mL/min (ref >60)
Glucose, Bld: 125 mg/dL — ABNORMAL HIGH (ref 70–99)
Potassium: 3.6 mmol/L (ref 3.5–5.1)
Sodium: 132 mmol/L — ABNORMAL LOW (ref 135–145)
Total Bilirubin: 0.6 mg/dL (ref 0.0–1.2)
Total Protein: 6.4 g/dL — ABNORMAL LOW (ref 6.5–8.1)

## 2024-02-06 LAB — MAGNESIUM: Magnesium: 1.9 mg/dL (ref 1.7–2.4)

## 2024-02-06 LAB — GLUCOSE, CAPILLARY
Glucose-Capillary: 110 mg/dL — ABNORMAL HIGH (ref 70–99)
Glucose-Capillary: 122 mg/dL — ABNORMAL HIGH (ref 70–99)
Glucose-Capillary: 147 mg/dL — ABNORMAL HIGH (ref 70–99)
Glucose-Capillary: 150 mg/dL — ABNORMAL HIGH (ref 70–99)
Glucose-Capillary: 153 mg/dL — ABNORMAL HIGH (ref 70–99)
Glucose-Capillary: 162 mg/dL — ABNORMAL HIGH (ref 70–99)

## 2024-02-06 LAB — VALPROIC ACID LEVEL: Valproic Acid Lvl: 39 ug/mL — ABNORMAL LOW (ref 50–100)

## 2024-02-06 LAB — PHOSPHORUS: Phosphorus: 3.4 mg/dL (ref 2.5–4.6)

## 2024-02-06 LAB — PROCALCITONIN: Procalcitonin: 0.24 ng/mL

## 2024-02-06 LAB — C-REACTIVE PROTEIN: CRP: 1.9 mg/dL — ABNORMAL HIGH (ref <1.0)

## 2024-02-06 LAB — PHENOBARBITAL LEVEL: Phenobarbital: 26.7 ug/mL (ref 15.0–40.0)

## 2024-02-06 MED ORDER — DEXTROSE 5 % IV SOLN
250.0000 mg | Freq: Two times a day (BID) | INTRAVENOUS | Status: DC
  Administered 2024-02-06 – 2024-02-07 (×3): 250 mg via INTRAVENOUS
  Filled 2024-02-06 (×3): qty 2.5
  Filled 2024-02-06 (×2): qty 250

## 2024-02-06 MED ORDER — MAGNESIUM SULFATE IN D5W 1-5 GM/100ML-% IV SOLN
1.0000 g | Freq: Once | INTRAVENOUS | Status: AC
  Administered 2024-02-06: 1 g via INTRAVENOUS
  Filled 2024-02-06: qty 100

## 2024-02-06 MED ORDER — POTASSIUM CHLORIDE 10 MEQ/100ML IV SOLN
10.0000 meq | INTRAVENOUS | Status: AC
Start: 2024-02-06 — End: 2024-02-06
  Administered 2024-02-06 (×2): 10 meq via INTRAVENOUS
  Filled 2024-02-06 (×2): qty 100

## 2024-02-06 MED ORDER — FREE WATER
150.0000 mL | Status: DC
  Administered 2024-02-06 – 2024-02-09 (×18): 150 mL

## 2024-02-06 MED ORDER — PROSOURCE TF20 ENFIT COMPATIBL EN LIQD
60.0000 mL | Freq: Two times a day (BID) | ENTERAL | Status: DC
  Administered 2024-02-06 – 2024-02-11 (×10): 60 mL
  Filled 2024-02-06 (×10): qty 60

## 2024-02-06 MED ORDER — PHENOBARBITAL SODIUM 65 MG/ML IJ SOLN
65.0000 mg | Freq: Two times a day (BID) | INTRAMUSCULAR | Status: DC
  Administered 2024-02-06 – 2024-02-07 (×3): 65 mg via INTRAVENOUS
  Filled 2024-02-06 (×3): qty 1

## 2024-02-06 MED ORDER — OSMOLITE 1.5 CAL PO LIQD
1000.0000 mL | ORAL | Status: DC
Start: 2024-02-06 — End: 2024-02-10
  Administered 2024-02-06 – 2024-02-09 (×4): 1000 mL
  Filled 2024-02-06 (×6): qty 1000

## 2024-02-06 MED ORDER — DEXTROSE 5 % IV SOLN
250.0000 mg | Freq: Two times a day (BID) | INTRAVENOUS | Status: DC
  Filled 2024-02-06 (×2): qty 2.5

## 2024-02-06 MED ORDER — PROSOURCE TF20 ENFIT COMPATIBL EN LIQD
60.0000 mL | Freq: Every day | ENTERAL | Status: DC
  Administered 2024-02-06: 60 mL
  Filled 2024-02-06: qty 60

## 2024-02-06 NOTE — Progress Notes (Signed)
 NAME:  [name], MRN:  [id number], DOB:  February 24, 1961, LOS: 7 ADMISSION DATE:  01/30/2024, CONSULTATION DATE:  02/05/2024 REFERRING MD:  [facility], EDP,  CHIEF COMPLAINT: fall, hypothermia   History of Present Illness:  63 year old man who was brought in by EMS from home after a fall.  Apparently wife called, patient is currently ANO x 1 and history obtained after chart review, unable to reach wife.  His heart rate initially was in the 80s, A/O x 1 per EMS, bradycardic to 40s in the ED , noted to be hypothermic 94.  Bruises of multiple ages left hip left eye and left shoulder Labs significant for no leukocytosis, hemoglobin 9.3, mild thrombocytopenia 1 27K, mild hypokalemia 3.3, hypomagnesemia 1.5, normal LFTs.  UDS positive for benzodiazepines, tox screen negative, ammonia 19, TSH 48.  EKG showed sinus bradycardia He was given 1 g of magnesium , 100 mg of IV Solu-Cortef  and 200 mics of levothyroxine  IV Head CT showed 9 mm right subdural hematoma with 3 mm midline shift and small volume acute SAH, cervical spine was negative. PCCM consulted for admission 02/05/2024 pulmonary critical care reconsulted for increased seizure activity and concern that his respiratory status may worsen with phenobarbital.  Pertinent  Medical History  Seizure disorder, COPD, anxiety, hypothyroidism   Significant Hospital Events: Including procedures, antibiotic start and stop dates in addition to other pertinent events   5/8 Admitted 5/9 No overnight issues, patient remained confused and restless, trying to get out of bed 5/10 ongoing issues with agitation overnight 5/14: PCCM was consulted 5/15: On RA and feels better. SpO2 93%  Interim History / Subjective:  Worsening seizures. Evaluated by neurosurgery. Phenobarbital has been started by neurology. He may require transfer back to intensive care unit should he become unable to protect his airway   Objective    Blood pressure (!) 166/84, pulse 99, temperature 98.5  F (36.9 C), temperature source Oral, resp. rate 16, height 5\' 6"  (1.676 m), weight 89.5 kg, SpO2 93%.        Intake/Output Summary (Last 24 hours) at 02/06/2024 0949 Last data filed at 02/06/2024 0524 Gross per 24 hour  Intake 0 ml  Output 600 ml  Net -600 ml   Filed Weights   02/03/24 0430 02/05/24 0500 02/06/24 0500  Weight: 91.1 kg 88 kg 89.5 kg    Examination: Afebrile, V/S are stable PERL Lungs: clear and symmetrical air entry Heart: NL S1/S2, no m/g/r Abd: no distension or tenderness LL: no edema, symmetrical  CNS: more awake today and follows commands, squeezes hands, move toes. 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 Please call us  back if we can be of any further assistance.  Labs   CBC: Recent Labs  Lab 02/02/24 0549 02/03/24 0445 02/04/24 0436 02/05/24 0432 02/06/24 0443  WBC 14.7* 15.2* 16.5* 14.6* 10.6*  NEUTROABS  --   --  10.7* 9.1* 6.2  HGB 10.0* 10.0* 10.6* 10.5* 9.6*  HCT 27.8* 27.5* 29.7* 29.9* 28.2*  MCV 86.6 87.9 86.8 87.9 91.0  PLT 144* 157 162 195 189    Basic Metabolic Panel: Recent Labs  Lab  02/02/24 0549 02/03/24 0445 02/04/24 0436 02/05/24 0432 02/06/24 0443  NA 126* 127* 128* 131* 132*  K 3.7 3.8 3.3* 3.8 3.6  CL 92* 93* 91* 97* 100  CO2 24 22 26 23  21*  GLUCOSE 153* 156* 144* 151* 125*  BUN 23 23 22 21 22   CREATININE 0.58* 0.64 0.65 0.65 0.68  CALCIUM  9.0 8.8* 9.2 9.1 8.4*  MG 2.0 2.1 2.1 2.0 1.9  PHOS 2.1* 2.7 3.1 3.5 3.4   GFR: Estimated Creatinine Clearance: 97.8 mL/min (by C-G formula based on SCr of 0.68 mg/dL). Recent Labs  Lab 01/30/24 2020 01/31/24 0528 02/03/24 0445 02/03/24 0837 02/04/24 0436 02/05/24 0432 02/06/24 0443  PROCALCITON  --   --   --  0.38 0.14 0.10 0.24  WBC  --    < > 15.2*  --  16.5* 14.6* 10.6*  LATICACIDVEN 2.1*  --   --   --   --   --   --    < > = values in this interval not displayed.    Liver Function Tests: Recent Labs  Lab 01/30/24 1952 01/31/24 0528 02/04/24 0436 02/05/24 0432 02/06/24 0443  AST 22 24 36 50* 31  ALT 11 14 32 40 34  ALKPHOS 34* 44 47 53 54  BILITOT 1.2 1.3* 0.9 0.7 0.6  PROT 6.5 8.0 7.2 7.6 6.4*  ALBUMIN 3.4* 4.0 3.5 3.4* 3.0*   No results for input(s): "LIPASE", "AMYLASE" in the last 168 hours. Recent Labs  Lab 01/30/24 2032  AMMONIA 19    ABG    Component Value Date/Time   PHART 7.49 (H) 02/05/2024 1530   PCO2ART 37 02/05/2024 1530   PO2ART 91 02/05/2024 1530   HCO3 28.2 (H) 02/05/2024 1530   TCO2 29 01/22/2016 2138   O2SAT 98.8 02/05/2024 1530     Coagulation Profile: Recent Labs  Lab 01/30/24 2032 01/31/24 0528 02/05/24 0857  INR 1.1 1.0 1.0    Cardiac Enzymes: No results for input(s): "CKTOTAL", "CKMB", "CKMBINDEX", "TROPONINI" in the last 168 hours.  HbA1C: Hgb A1c MFr Bld  Date/Time Value Ref Range Status  01/31/2024 05:28 AM 6.1 (H) 4.8 - 5.6 % Final    Comment:    (NOTE) Pre diabetes:          5.7%-6.4%  Diabetes:              >6.4%  Glycemic control for   <7.0% adults with diabetes     CBG: Recent Labs  Lab 02/05/24 1555 02/05/24 1942  02/05/24 2328 02/06/24 0335 02/06/24 0724  GLUCAP 115* 127* 127* 150* 122*   Past Medical History:  He,  has a past medical history of Anemia, Depression, Diabetes mellitus without complication (HCC), Emphysema of lung (HCC), GERD (gastroesophageal reflux disease), Hiatal hernia, Hyperlipidemia, Hypertension, Memory loss, Seizures (HCC), and Thyroid  disease.   Surgical History:   Past Surgical History:  Procedure Laterality Date   APPENDECTOMY     DENTAL SURGERY     EYE SURGERY     TONSILLECTOMY AND ADENOIDECTOMY  Social History:   reports that he has been smoking e-cigarettes and cigarettes. He has never used smokeless tobacco. He reports that he does not drink alcohol and does not use drugs.   Family History:  His family history includes Heart disease in his father; Stroke in his mother; Thyroid  disease in his brother and father.   Allergies Allergies  Allergen Reactions   Levetiracetam  Other (See Comments)    Severe anxiety, agitation, hyperactivity, very negative disposition   Tegretol [Carbamazepine] Anxiety and Other (See Comments)    Increased seizures/ Induces seizures   Penicillins Other (See Comments)    Reaction not noted   Topiramate Other (See Comments)    Reaction not noted     Home Medications  Prior to Admission medications   Medication Sig Start Date End Date Taking? Authorizing Provider  albuterol  (PROVENTIL  HFA;VENTOLIN  HFA) 108 (90 BASE) MCG/ACT inhaler Inhale 2 puffs into the lungs every 4 (four) hours as needed for wheezing or shortness of breath.   Yes [provider]  alprazolam  (XANAX ) 2 MG tablet Take 0.5 tablets (1 mg total) by mouth 2 (two) times daily. Patient taking differently: Take 2-4 mg by mouth See admin instructions. Take 4 mg by mouth at bedtime and an additional 2 mg once a day as needed for anxiety 11/03/15  Yes Jenean Minus, DO  citalopram  (CELEXA ) 20 MG tablet Take 20 mg by mouth daily.  02/25/15  Yes [provider]  divalproex  (DEPAKOTE  ER) 500 MG 24 hr tablet Take 500 mg by mouth in the morning and at bedtime.   Yes [provider]  levothyroxine  (SYNTHROID , LEVOTHROID) 150 MCG tablet Take 150 mcg by mouth daily before breakfast.  12/30/14  Yes [provider]  metFORMIN (GLUCOPHAGE-XR) 500 MG 24 hr tablet Take 500 mg by mouth in the morning and at bedtime. 02/19/19  Yes [provider]  traZODone  (DESYREL ) 100 MG tablet Take 200 mg by mouth at bedtime.   Yes [provider]  TRELEGY ELLIPTA 100-62.5-25 MCG/ACT AEPB Inhale 1 puff into the lungs daily. 01/21/23  Yes [provider]  Ascorbic Acid (VITAMIN C PO) Take 1 tablet by mouth daily.    [provider]  Calcium  Carb-Cholecalciferol (CALCIUM  HIGH POTENCY/VITAMIN D) 600-5 MG-MCG TABS Take 1 tablet by mouth daily. 05/16/21   [provider]  omeprazole (PRILOSEC) 20 MG capsule Take 20 mg by mouth daily.    [provider]    Madelynn Schilder, MD [facility] Pager: see AMION

## 2024-02-06 NOTE — Progress Notes (Signed)
 Occupational Therapy Treatment Patient Details Name: [name] MRN: [id number] DOB: 09/04/1961 Today's Date: 02/06/2024   History of present illness Pt is a 63 y.o. male presenting 5/8 after a fall at home. UDS positive for benzodiazepines. EKG with sinus bradycardia. Head CT 5/8 showed 9 mm right subdural hematoma with 3 mm midline shift and small volume acute SAH.  Pt on continuous EEG 5/14 with continued seizures.  PMH significant for seizures and COPD.   OT comments  Pt with slow improvements in therapy engagement. Pt kept eyes closed for much of session but did open eyes consistently when cued. Pt with consistent following of one step commands during basic tasks and did respond when asked if LUE w/ chronic pain/ROM issues. Deferred EOB/OOB attempts until cleared by MD. [name] at bedside; encouraged continued engagement with pt during the day and ROM as comfortable.       If plan is discharge home, recommend the following:  Two people to help with walking and/or transfers;Two people to help with bathing/dressing/bathroom;Assist for transportation;Help with stairs or ramp for entrance;Direct supervision/assist for medications management;Direct supervision/assist for financial management;Assistance with cooking/housework;Assistance with feeding   Equipment Recommendations  Other (comment) (TBD)    Recommendations for Other Services      Precautions / Restrictions Precautions Precautions: Fall Recall of Precautions/Restrictions: Impaired Precaution/Restrictions Comments: cont EEG Restrictions Weight Bearing Restrictions Per Provider Order: No       Mobility Bed Mobility Overal bed mobility: Needs Assistance             General bed mobility comments: TotaL A x 2 to reposition in bed. Deferred EOB or OOB per MD recs    Transfers                   General transfer comment: NT     Balance                                           ADL  either performed or assessed with clinical judgement   ADL Overall ADL's : Needs assistance/impaired                                       General ADL Comments: Emphasis on command following, general awareness and education to family on ways to keep pt engaged and further gradually work on cognition    Extremity/Trunk Assessment Upper Extremity Assessment Upper Extremity Assessment: Difficult to assess due to impaired cognition;LUE deficits/detail LUE Deficits / Details: hx of impaired L shoulder ROM - passively to 15*. some crepitation felt with shoulder and elbow. pt able to respond when asked if this UE hurt   Lower Extremity Assessment Lower Extremity Assessment: Defer to PT evaluation        Vision   Vision Assessment?: No apparent visual deficits   Perception     Praxis     Communication Communication Communication: No apparent difficulties   Cognition Arousal: Alert, Lethargic Behavior During Therapy: Flat affect Cognition: Cognition impaired     Awareness: Intellectual awareness impaired, Online awareness impaired Memory impairment (select all impairments): Short-term memory, Working [employer] fast streamer, Conservation officer, historic buildings Attention impairment (select first level of impairment): Sustained attention Executive functioning impairment (select all impairments): Sequencing, Reasoning, Problem solving OT - Cognition Comments: eyes closed for much  of session  but did open briefly when cued to do so. able to follow all one step commands and answering simple questions when asked. EEG tech present, pt able to repeat "no ifs ands or buts" and "12 pink elephants" when cued, also able to later report how many pink elephants there were                 Following commands: Impaired Following commands impaired: Only follows one step commands consistently, Follows multi-step commands inconsistently      Cueing   Cueing Techniques: Verbal cues  Exercises       Shoulder Instructions       General Comments Wife at bedside, engaged    Pertinent Vitals/ Pain       Pain Assessment Pain Assessment: No/denies pain  Home Living     Available Help at Discharge: Family;Available 24 hours/day Type of Home: House                              Lives With: Spouse    Prior Functioning/Environment              Frequency  Min 1X/week        Progress Toward Goals  OT Goals(current goals can now be found in the care plan section)  Progress towards OT goals: OT to reassess next treatment  Acute Rehab OT Goals OT Goal Formulation: Patient unable to participate in goal setting Time For Goal Achievement: 02/15/24 Potential to Achieve Goals: Fair ADL Goals Pt Will Perform Grooming: with set-up;sitting Pt Will Perform Upper Body Bathing: with set-up;sitting Pt Will Perform Lower Body Bathing: with min assist;sit to/from stand Pt Will Transfer to Toilet: with min assist;ambulating Additional ADL Goal #1: Pt will follow one step commands without cues for sequencing or problem solving.  Plan      Co-evaluation                 AM-PAC OT "6 Clicks" Daily Activity     Outcome Measure   Help from another person eating meals?: Total Help from another person taking care of personal grooming?: A Lot Help from another person toileting, which includes using toliet, bedpan, or urinal?: Total Help from another person bathing (including washing, rinsing, drying)?: A Lot Help from another person to put on and taking off regular upper body clothing?: A Lot Help from another person to put on and taking off regular lower body clothing?: Total 6 Click Score: 9    End of Session    OT Visit Diagnosis: Unsteadiness on feet (R26.81);Muscle weakness (generalized) (M62.81);Other symptoms and signs involving cognitive function;Low vision, both eyes (H54.2)   Activity Tolerance Patient tolerated treatment well   Patient Left in  bed;with call bell/phone within reach;with bed alarm set;with family/visitor present;Other (comment) (EEG tech)   Nurse Communication Mobility status        Time: 4098-1191 OT Time Calculation (min): 22 min  Charges: OT General Charges $OT Visit: 1 Visit OT Treatments $Therapeutic Activity: 8-22 mins  Lawrence Pretty, OTR/L Acute Rehab Services Office: [phone]   Annabella Barr 02/06/2024, 12:34 PM

## 2024-02-06 NOTE — Plan of Care (Signed)
 Pt has rested quietly throughout the night with no distress noted. Oriented to self only. Verbalizes at times. Moves all extremities. Follows simple commands. Pt did squeeze my fingers in both hands when asked. Unable to do any other neuro assessments. On room air. SR/ST on the monitor. Cortrak intact and clamped. Purewick intact to suction. Meds given through tube. No complaints voiced.     Problem: Metabolic: Goal: Ability to maintain appropriate glucose levels will improve Outcome: Progressing   Problem: Clinical Measurements: Goal: Respiratory complications will improve Outcome: Progressing Goal: Cardiovascular complication will be avoided Outcome: Progressing   Problem: Pain Managment: Goal: General experience of comfort will improve and/or be controlled Outcome: Progressing

## 2024-02-06 NOTE — Progress Notes (Addendum)
 Nutrition Follow-up  DOCUMENTATION CODES:   Not applicable  INTERVENTION:  Re-initiate tube feeding via Cortrak: Osmolite 1.5 at 55 ml/hr (1320 ml per day) Prosource TF20 60 ml BID FWF q4 hours (900ml per day)   Provides 2140 kcal, 124 gm protein, 1903 ml free water daily (FWF + TF)   MVI with minerals daily  Monitor magnesium , potassium, and phosphorus BID for at least 3 days, MD to replete as needed Thiamine 100 mg daily for 1 more day   NUTRITION DIAGNOSIS:  Inadequate oral intake related to lethargy/confusion as evidenced by NPO status. - remains applicable  GOAL:  Patient will meet greater than or equal to 90% of their needs - being met via TF  MONITOR:  TF tolerance, Diet advancement  REASON FOR ASSESSMENT:  Consult Enteral/tube feeding initiation and management  ASSESSMENT:   Pt with PMH of COPD and seizure disorder admitted after falling at home dx with acute R SDH with 3 mm midline shift and acute traumatic SAH.  5/9 - admitted; s/p cortrak placement; tip in gastric fundus  5/10 - Cortrak dislodged and NGT placed 5/11 - txr to 5W 5/13 - NGT removed 5/14 - Cortrak placed d/t decline in status, NPO d/t potential for pending surgery 5/15 - TF re-initiated at goal rate 5/15 - EEG: suggestive of cortical dysfunction arising from R hemisphere, mild diffuse encephalopathy  Patient's midline shift worsening over last few days. More alert today, however varies in ability to cooperate and engage. No plans for surgical evacuation of hematoma at this time.   Pt able to resume tube feeds today. Orders entered to resume at goal rate as he was previously tolerating this. Spoke with MD about adding free water flushes for hydration. He is amicable to this.  Average Meal Intake 5/13: 0-50% x2 documented meals  No meaningful intake to review as he was with oral diet for <2 days and was not alert enough to consume much of anything. Will re-assess if/when diet able to be  advanced.  Admit Weight: 93.7kg Current Weight: 89.5kg  Some weight loss this admission. No significant edema. Bowels stable. Bowels stable.  Net IO Since Admission: 1,093.32 mL [02/06/24 1355]   Drains/Lines: Cortrak (65cm) PICC (placed 5/14) External catheter UOP: x24 hours  Sodium low, but trending up. NaCl continues with hopes of stopping normal saline soon. Has required magnesium  and potassium supplementation. Still monitoring for refeeding. WBC trending down.  Meds: SSI 0-9 q4, levothyroxine , MVI, pantoprazole , NaCl, thiamine Drips: IV ABX 10 mEq KCl x2 1g Mg sulfate x1  Labs:  Na+ 127>128>131>132 (L) K+ 3.3>3.8>3.6 (wdl) PHOS 3.4 (wdl) Mg 1.6--->2.0>1.9 (wdl) CRP 1.9 (H) TSH 30.797 (H) T4 0.31 (L) WBC 16.5>14.6>10.6 (H) CBGs 125-151 x24 hours A1c 6.1 (01/2024)  Diet Order:   Diet Order             Diet NPO time specified Except for: Other (See Comments)  Diet effective now             EDUCATION NEEDS:  Not appropriate for education at this time  Skin:  Skin Assessment: Skin Integrity Issues: Skin Integrity Issues:: DTI, Stage II DTI: coccyx Stage II: sacrum  Last BM:  5/13 - type 7 x1  Height:  Ht Readings from Last 1 Encounters:  01/30/24 5\' 6"  (1.676 m)   Weight:  Wt Readings from Last 1 Encounters:  02/06/24 89.5 kg   Ideal Body Weight:     BMI:  Body mass index is 31.85 kg/m.  Estimated Nutritional Needs:   Kcal:  2000-2300  Protein:  105-125 grams  Fluid:  > 2 L/day  Con Decant MS, RD, LDN Registered Dietitian Clinical Nutrition RD Inpatient Contact Info in Amion

## 2024-02-06 NOTE — Progress Notes (Signed)
 NEUROLOGY CONSULT FOLLOW UP NOTE   Date of service: Feb 06, 2024 Patient Name: [name] MRN:  [id number] DOB:  [birth year]  Interval Hx/subjective   Seen and examined. Wife bedside for support and aware of plan.   Patient following commands, arousable to voice and touch, opens eyes spontaneously to voice, more alert today. Still intermittently drowsy. Poor cooperation and variable effort with eye exam.  Vitals   Vitals:   02/05/24 2300 02/06/24 0300 02/06/24 0500 02/06/24 0724  BP: (!) 148/72 (!) 157/83  (!) 166/84  Pulse: (!) 106 (!) 107  99  Resp: 17 16  16   Temp: 98 F (36.7 C) 98.4 F (36.9 C)  98.5 F (36.9 C)  TempSrc: Oral Axillary  Oral  SpO2: 94% 91%  93%  Weight:   89.5 kg   Height:         Body mass index is 31.85 kg/m.  Physical Exam  General: Drowsy, does open eyes to voice HEENT: Bruising on the left eye and forehead CVS: tachycardic Neurologic exam Less drowsy compared to yesterday, follows commands variably. Arousable to voice and touch. Alert to person, place and year. Disoriented to year.  Cranial nerves: Pupils are equal round reactive to light, extraocular movements difficult to assess due to variable effort but gaze is midline, right eye slightly upward. left eyelid has a lot of swelling, facial sensation and symmetry seem preserved. Shoulder shrug intact. Motor examination reveals diminished left upper extremity strength but that is due to shoulder pain and has been baseline for him.  Right upper extremity appears full strength. Grip strength intact bilaterally. Able to lift left lower extremity to gravity and unable to lift right lower extremity to gravity today.   Sensory exam: As above  Medications  Current Facility-Administered Medications:    0.9 %  sodium chloride  infusion, , Intravenous, Continuous, Elgergawy, Ardia Kraft, MD, Last Rate: 75 mL/hr at 02/05/24 0855, New Bag at 02/05/24 0855   ALPRAZolam  (XANAX ) tablet 0.5 mg, 0.5 mg, Oral,  BID, Young Hensen, RPH, 0.5 mg at 02/05/24 2241   arformoterol  (BROVANA ) nebulizer solution 15 mcg, 15 mcg, Nebulization, BID, Shelli Dexter M, PA-C, 15 mcg at 02/06/24 0801   budesonide  (PULMICORT ) nebulizer solution 0.25 mg, 0.25 mg, Nebulization, BID, Shelli Dexter M, PA-C, 0.25 mg at 02/06/24 0801   cefTRIAXone (ROCEPHIN) 1 g in sodium chloride  0.9 % 100 mL IVPB, 1 g, Intravenous, Q24H, Cala Castleman, MD, Last Rate: 200 mL/hr at 02/05/24 1204, 1 g at 02/05/24 1204   Chlorhexidine  Gluconate Cloth 2 % PADS 6 each, 6 each, Topical, Daily, Alva, Rakesh V, MD, 6 each at 02/05/24 1221   cloNIDine (CATAPRES - Dosed in mg/24 hr) patch 0.1 mg, 0.1 mg, Transdermal, Q Wed, Elgergawy, Ardia Kraft, MD, 0.1 mg at 02/05/24 1150   docusate sodium  (COLACE) capsule 100 mg, 100 mg, Oral, BID PRN, Shelli Dexter M, PA-C   haloperidol lactate (HALDOL) injection 2 mg, 2 mg, Intramuscular, Q6H PRN, Singh, Prashant K, MD   hydrALAZINE (APRESOLINE) injection 10 mg, 10 mg, Intravenous, Q4H PRN, Ogan, Okoronkwo U, MD, 10 mg at 02/06/24 0801   hydrALAZINE (APRESOLINE) injection 5 mg, 5 mg, Intravenous, Q4H PRN, Elgergawy, Ardia Kraft, MD, 5 mg at 02/05/24 1149   hydrALAZINE (APRESOLINE) tablet 50 mg, 50 mg, Oral, Q8H, Young Hensen, RPH, 50 mg at 02/06/24 4098   insulin  aspart (novoLOG ) injection 0-9 Units, 0-9 Units, Subcutaneous, Q4H, Shelli Dexter M, PA-C, 1 Units at 02/06/24 0756   labetalol (NORMODYNE) injection  10 mg, 10 mg, Intravenous, Q2H PRN, Paliwal, Aditya, MD, 10 mg at 02/01/24 2047   lacosamide (VIMPAT) 200 mg in sodium chloride  0.9 % 25 mL IVPB, 200 mg, Intravenous, Q12H, Shafer, Devon, NP, Last Rate: 90 mL/hr at 02/05/24 2246, 200 mg at 02/05/24 2246   levothyroxine  (SYNTHROID ) tablet 150 mcg, 150 mcg, Oral, Q0600, Young Hensen, RPH, 150 mcg at 02/06/24 1610   magnesium  sulfate IVPB 1 g 100 mL, 1 g, Intravenous, Once, Albustami, Linna Richard, MD   multivitamin with minerals tablet 1 tablet, 1 tablet,  Oral, Daily, Young Hensen, RPH   nicotine (NICODERM CQ - dosed in mg/24 hours) patch 14 mg, 14 mg, Transdermal, Daily, Ogan, Okoronkwo U, MD, 14 mg at 02/05/24 1151   Oral care mouth rinse, 15 mL, Mouth Rinse, 4 times per day, Trevor Fudge, MD, 15 mL at 02/05/24 2247   Oral care mouth rinse, 15 mL, Mouth Rinse, PRN, Trevor Fudge, MD, 15 mL at 02/02/24 0529   pantoprazole  (PROTONIX ) injection 40 mg, 40 mg, Intravenous, QHS, Shelli Dexter M, PA-C, 40 mg at 02/05/24 2241   polyethylene glycol (MIRALAX  / GLYCOLAX ) packet 17 g, 17 g, Oral, Daily PRN, Shelli Dexter M, PA-C   potassium chloride 10 mEq in 100 mL IVPB, 10 mEq, Intravenous, Q1 Hr x 2, Albustami, Linna Richard, MD, Last Rate: 100 mL/hr at 02/06/24 0800, 10 mEq at 02/06/24 0800   revefenacin  (YUPELRI ) nebulizer solution 175 mcg, 175 mcg, Nebulization, Daily, Shelli Dexter M, PA-C, 175 mcg at 02/06/24 0801   sodium chloride  flush (NS) 0.9 % injection 10-40 mL, 10-40 mL, Intracatheter, Q12H, Elgergawy, Ardia Kraft, MD, 10 mL at 02/05/24 2247   sodium chloride  flush (NS) 0.9 % injection 10-40 mL, 10-40 mL, Intracatheter, PRN, Elgergawy, Ardia Kraft, MD   sodium chloride  tablet 1 g, 1 g, Oral, BID, Young Hensen, RPH, 1 g at 02/05/24 2241   thiamine (VITAMIN B1) tablet 100 mg, 100 mg, Oral, Daily, Young Hensen, [facility]   traMADol  (ULTRAM ) tablet 50 mg, 50 mg, Oral, Q6H PRN, Young Hensen, RPH   valproate (DEPACON ) 500 mg in dextrose  5 % 50 mL IVPB, 500 mg, Intravenous, Q12H, Elgergawy, Ardia Kraft, MD, Last Rate: 55 mL/hr at 02/05/24 2332, 500 mg at 02/05/24 2332  Labs and Diagnostic Imaging   CBC:  Recent Labs  Lab 02/05/24 0432 02/06/24 0443  WBC 14.6* 10.6*  NEUTROABS 9.1* 6.2  HGB 10.5* 9.6*  HCT 29.9* 28.2*  MCV 87.9 91.0  PLT 195 189    Basic Metabolic Panel:  Lab Results  Component Value Date   NA 132 (L) 02/06/2024   K 3.6 02/06/2024   CO2 21 (L) 02/06/2024   GLUCOSE 125 (H) 02/06/2024   BUN 22 02/06/2024   CREATININE 0.68  02/06/2024   CALCIUM  8.4 (L) 02/06/2024   GFRNONAA >60 02/06/2024   GFRAA >60 04/19/2019   Lipid Panel: No results found for: "LDLCALC" HgbA1c:  Lab Results  Component Value Date   HGBA1C 6.1 (H) 01/31/2024   Urine Drug Screen:     Component Value Date/Time   LABOPIA NONE DETECTED 01/30/2024 2003   COCAINSCRNUR NONE DETECTED 01/30/2024 2003   LABBENZ POSITIVE (A) 01/30/2024 2003   AMPHETMU NONE DETECTED 01/30/2024 2003   THCU NONE DETECTED 01/30/2024 2003   LABBARB NONE DETECTED 01/30/2024 2003    Alcohol Level     Component Value Date/Time   [facility] <15 01/30/2024 2032   INR  Lab Results  Component Value Date   INR 1.0 02/05/2024  APTT  Lab Results  Component Value Date   APTT 24 02/05/2024   AED levels:  Lab Results  Component Value Date   LEVETIRACETA <2.0 (L) 01/30/2024    CT Head without contrast(Personally reviewed): 1. Acute 9 mm thick right cerebral convexity subdural hemorrhage which extends along the right tentorial leaflet. Resulting 3 mm leftward midline shift. 2. Scattered small volume of acute subarachnoid hemorrhage. 3. No evidence of acute fracture or traumatic malalignment in the cervical spine.  Repeat CT head 5/13 1. Largely resolved left side but Increased Right Side Subdural Hematoma since 01/31/2024. Right SDH now up to 11 mm with increased leftward midline shift, (now 4-5 mm) and increased mass effect on the right lateral ventricle. 2. Small volume SAH now apparent. Stable small volume IVH. No acute ventriculomegaly. Possible small superimposed right posterior hemisphere hemorrhagic contusion. 3. No skull fracture identified.   rEEG: 02/04/2024  This study is suggestive of cortical dysfunction arising from right hemisphere likely secondary to underlying structural abnormality/ subdural hematoma. This EEG pattern is on the ictal-interictal continuum with higher potential for seizure recurrence. Consider long term eeg monitoring if concern for  ictal-interictal activity persists.   cEEG: 02/04/2024 1724 to 02/05/2024 0845  ABNORMALITY - Seizures without clinical signs, right posterior quadrant.  - Lateralized rhythmic delta slowing, right hemisphere ( LRDA) - Continuous slow, generalized and lateralized right hemisphere IMPRESSION: This study showed seizures without clinical signs arising from  right posterior quadrant. Average 3-6 seizures were noted per hour, lasting about 2 minutes each. Additionally there was cortical dysfunction arising from right hemisphere likely secondary to underlying structural abnormality/ subdural hematoma. Lastly there was moderate diffuse encephalopathy.    cEEG Duration: 02/05/2024 1724 to 02/06/2024 1000  This study is suggestive of cortical dysfunction arising from right hemisphere, likely secondary to underlying structural abnormality. Additionally there is mild diffuse encephalopathy. No seizures or epileptiform discharges were seen throughout the recording.   Assessment   [name] is a 63 y.o. male past history of COPD, anxiety, hypothyroidism, diabetes, hypertension, lipidemia and seizure disorder admitted for multiple falls and found to have right greater than left subdural hematomas with a leftward midline shift which has worsened over the past few days.  Neurology was consulted for worsening mentation. His CT head on 5/13  also reveals worsening of the right-sided subdural and worsening right-to-left midline shift. Prior EEG on 5/14 shows 3-6 seizures per hour each lasting approximately 2 minutes. He continued on the Depakote  500 mg twice daily, lacosamide increased to 200 mg twice daily and he was loaded with phenobarbital. Patient more alert today, compared with prior and improving. No seizures on overnight EEG. Will gradually begin to wean down on depakote  due to potential interaction with phenobarbital.   Impression: Breakthrough seizures in the setting of subdural hematoma, subdural  hematoma  Recommendations  Continue LTM EEG for additional 24 hours, will discontinue if without seizure  S/P Phenobarbital load on 5/14 Phenobarb level: 26.7 Start phenobarbital 65 twice daily Decreased Depakote  250 mg twice daily, with consideration to taper off due to interaction with phenobarb VPA level: 39 subtherapeutic  Continue Lacosamide to 200 mg twice daily, increased on 5/14  Appreciate neurosurgical consideration for hematoma evacuation We will continue to follow Plan discussed with Dr. Osborne Blazer  ---- Brayton Calin, MD [facility] Psych[facility] Resident PGY-1    Attending Neurohospitalist Addendum Patient seen and examined with APP/Resident. Agree with the history and physical as documented above. Agree with the plan as documented, which I helped  formulate. I have independently reviewed the chart, obtained history, review of systems and examined the patient.I have personally reviewed pertinent head/neck/spine imaging (CT/MRI). Please feel free to call with any questions.  -- Tona Francis, MD Neurologist [facility] Pager: [phone]

## 2024-02-06 NOTE — Evaluation (Signed)
 Speech Language Pathology Evaluation Patient Details Name: [name] MRN: [id number] DOB: 29-Sep-1960 Today's Date: 02/06/2024 Time: 4098-1191 SLP Time Calculation (min) (ACUTE ONLY): 12 min  Problem List:  Patient Active Problem List   Diagnosis Date Noted   Subdural hematoma (HCC) 01/30/2024   Toxic metabolic encephalopathy 01/24/2016   Pressure ulcer 01/23/2016   Non-insulin  dependent type 2 diabetes mellitus (HCC)    Altered mental status 01/22/2016   COPD (chronic obstructive pulmonary disease) (HCC) 01/22/2016   Thrombocytopenia (HCC) 01/22/2016   Acute encephalopathy 11/01/2015   Chronic daily headache 03/08/2015   Localization-related symptomatic 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, intractable, without status epilepticus (HCC) 03/07/2015   Memory loss 03/07/2015   Seizure disorder (HCC) 03/05/2014   Other and unspecified hyperlipidemia 03/05/2014   Essential hypertension, benign 03/05/2014   Diabetes (HCC) 03/05/2014   Depression 03/05/2014   Generalized anxiety disorder 03/05/2014   Hypothyroidism 03/05/2014   Poor dentition 03/05/2014   Past Medical History:  Past Medical History:  Diagnosis Date   Anemia    Depression    Diabetes mellitus without complication (HCC)    Emphysema of lung (HCC)    GERD (gastroesophageal reflux disease)    Hiatal hernia    Hyperlipidemia    Hypertension    Memory loss    Seizures (HCC)    Thyroid  disease    Past Surgical History:  Past Surgical History:  Procedure Laterality Date   APPENDECTOMY     DENTAL SURGERY     EYE SURGERY     TONSILLECTOMY AND ADENOIDECTOMY     HPI:  Pt is a 63 y.o. male who was brought to ED by EMS from home after a fall. CT head (01/30/23) revealed "Slightly increased size of right larger than left subdural hematomas. Small volume subarachnoid hemorrhage with mildly increased hemorrhage in the left lateral ventricle. Failed yale 02/01/24. PMH: Seizure disorder, COPD; ST  f/u for po readiness in acute setting; pt currently has TF for nutritive/hydration purposes.   Assessment / Plan / Recommendation Clinical Impression   Pt remains quite lethargic and could only participate in rudimentary cognitive/communication assessment this morning. His wife was at the bedside.  Maintained eyes closed despite cues to attempt to open.  Followed simple functional commands intermittently; oriented to person only.  Repeated single words.  Impaired attention - focused and sustained - with poor working memory secondary to attention deficit and poor arousal.  SLP will follow and complete deeper cognitive evaluation as mental status allows.  Did NOT offer PO trials today given his level of arousal.  SLP will continue to follow - hopefully as he begins to wake up for longer periods of time we can resume a PO diet. D/W his wife, who verbalized agreement with plan.     SLP Assessment  SLP Recommendation/Assessment: Patient needs continued Speech Lanaguage Pathology Services SLP Visit Diagnosis: Cognitive communication deficit (R41.841)    Recommendations for follow up therapy are one component of a multi-disciplinary discharge planning process, led by the attending physician.  Recommendations may be updated based on patient status, additional functional criteria and insurance authorization.    Follow Up Recommendations  Other (comment) (tba)    Assistance Recommended at Discharge  Frequent or constant Supervision/Assistance  Functional Status Assessment Patient has had a recent decline in their functional status and demonstrates the ability to make significant improvements in function in a reasonable and predictable amount of time.  Frequency and Duration min 2x/week  2 weeks  SLP Evaluation Cognition  Overall Cognitive Status: Impaired/Different from baseline Arousal/Alertness: Lethargic Orientation Level: Oriented to person;Disoriented to place;Disoriented to  time;Disoriented to situation Attention: Focused Focused Attention: Impaired Focused Attention Impairment: Verbal basic Memory: Impaired Memory Impairment: Storage deficit Awareness: Impaired       Comprehension  Auditory Comprehension Overall Auditory Comprehension: Impaired Commands: Impaired One Step Basic Commands: 25-49% accurate    Expression Expression Primary Mode of Expression: Verbal Verbal Expression Overall Verbal Expression: Impaired Automatic Speech: Social Response Written Expression Dominant Hand: Left   Oral / Motor  Oral Motor/Sensory Function Overall Oral Motor/Sensory Function: Within functional limits            Myna Asal Laurice 02/06/2024, 11:02 AM Mylinda Asa L. Beatris Lincoln, MA CCC/SLP Clinical Specialist - Acute Care SLP Acute Rehabilitation Services Office number [phone]

## 2024-02-06 NOTE — Progress Notes (Signed)
 PROGRESS NOTE                                                                                                                                                                                                             Patient Demographics:    [name], is a 63 y.o. male, DOB - September 04, 1961, WUJ:[id number]  Outpatient Primary MD for the patient is Claudell Cruz, MD    LOS - 7  Admit date - 01/30/2024    Chief Complaint  Patient presents with   Fall   Altered Mental Status       Brief Narrative (HPI from H&P)   63 year old man who was brought in by EMS from home after a fall.  Apparently wife called, patient is currently ANO x 1 and history obtained after chart review, unable to reach wife.  His heart rate initially was in the 80s, A/O x 1 per EMS, bradycardic to 40s in the ED , noted to be hypothermic 94.  Bruises of multiple ages left hip left eye and left shoulder Labs significant for no leukocytosis, hemoglobin 9.3, mild thrombocytopenia 1 27K, mild hypokalemia 3.3, hypomagnesemia 1.5, normal LFTs.  UDS positive for benzodiazepines, tox screen negative, ammonia 19, TSH 48.  EKG showed sinus bradycardia.  He was given 1 g of magnesium , 100 mg of IV Solu-Cortef  and 200 mics of levothyroxine  IV. Head CT showed 9 mm right subdural hematoma with 3 mm midline shift and small volume acute SAH, cervical spine was negative.  He was admitted to ICU by pulmonary critical care, seen by neurosurgeon Dr. Michale Age, stabilized and transferred to my care on 02/03/2024 on day 4 of his hospital stay, still little confused with NG tube in.  Significant Hospital Events: Including procedures, antibiotic start and stop dates in addition to other pertinent events   5/8 Admitted 5/9 No overnight issues, patient remained confused and restless, trying to get out of bed 5/10 ongoing issues with agitation overnight 5/11 mentation improved this a.m.  with less impulsivity 02/03/2024 transferred to [facility] 02/04/2024 LTM EEG   Subjective:   No significant events overnight as discussed with staff, patient remains altered, but as discussed with wife at bedside, no significant events overnight.      Assessment  & Plan :    Acute right subdural hematoma with 3 mm midline shift -  Acute traumatic subarachnoid hemorrhage status post fall  -Repeat head CT 5/9 with slightly increased size of right larger than left subdural hematomas with 3 mm leftward midline shift, neurosurgery on board, appreciate their input.  - repeat CT scan on 02/04/2024 done as he was slightly drowsy, mild worsening on the right sided SDH. - Discussed with neurosurgery 5/14, they would like to avoid subjecting him to surgery and a palliative procedure unless he fails maximal medical treatment for seizures   Acute metabolic encephalopathy. - This is multifactorial, in the setting of subdural hematoma, seizures, and multiple AEDs - Seizure input greatly appreciated, will keep close observation given multiple AEDs and encephalopathy - remains n.p.o., cortrak reinserted, will start on tube feed today as hopefully no need for surgery as long he is improving current AED regimen  Breakthrough seizures in the setting of subdural hematoma Controlled seizures at baseline -LTM EEG - Management per neurology, did show significant improvement after starting phenobarbital yesterday, apparently no evidence of recurrent seizures since 12:20 PM yesterday,  Depakote  is being decreased with consideration to taper due to interaction with phenobarbital, continue with current dose of Vimpat    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  Unseld on compliance.   Bradycardia/hypothermia due to hypothyroidism - resolved - With initiation of IV  Synthroid  bradycardia has now resolved, continue to monitor on telemetry.  Echo stable.   Anxiety disorder on chronic benzodiazepine -It appears patient is on high-dose benzodiazepines upwards of 4 mg of Xanax  nightly Continue reduced dosed home Xanax  1 mg twice daily Monitor for signs of withdrawal   COPD, not in exacerbation Tobacco dependence P: Aspiration precautions Continue home Brovana  and Yupelri  Smoking cessation education when appropriate   Hyponatremia   -Neurology input appreciated, improving   UTI.   - continue with Rocephin   Prediabetes P: Continue SSI CBG goal 140-180 CBG checks every 4  Lab Results  Component Value Date   HGBA1C 6.1 (H) 01/31/2024   CBG (last 3)  Recent Labs    02/06/24 0335 02/06/24 0724 02/06/24 1245  GLUCAP 150* 122* 110*          Condition -  Guarded  Family Communication  :  Discussed with wife at bedside  Code Status : Full code  Consults  : PCCM, neurosurgery Dr. Michale Age, neurology, nephrology  PUD Prophylaxis : PPI   Procedures  :     EEG 02/03/2024.  No seizures.     CT head 02/04/2024.  Noted discussed with Dr. Michale Age neurosurgery.  No interventions needed.    1. Largely resolved left side but Increased Right Side Subdural Hematoma since 01/31/2024. Right SDH now up to 11 mm with increased leftward midline shift, (now 4-5 mm) and increased mass effect on the right lateral ventricle. 2. Small volume SAH now apparent. Stable small volume IVH. No acute ventriculomegaly. Possible small superimposed right posterior Emma sphere hemorrhagic contusion. 3. No skull fracture identified.   CT 5/9 -  1. Slightly increased size of right larger than left subdural hematomas. Unchanged mass effect and 3 mm of leftward midline shift. 2. Small volume subarachnoid hemorrhage with mildly increased hemorrhage in the left lateral ventricle, possibly redistribution.  CT 5/8 -   1. Acute 9 mm thick right cerebral convexity  subdural hemorrhage which extends along the right tentorial leaflet. Resulting 3 mm leftward midline  shift. 2. Scattered small volume of acute subarachnoid hemorrhage. 3. No evidence of acute fracture or traumatic malalignment in the cervical spine.  TTE - 1. Left ventricular ejection fraction, by estimation, is 60 to 65% . The left ventricle has normal function. The left ventricle has no regional wall motion abnormalities. Left ventricular diastolic parameters are consistent with Grade I diastolic dysfunction ( impaired relaxation) . 2. Right ventricular systolic function is normal. The right ventricular size is normal. 3. The mitral valve is normal in structure. No evidence of mitral valve regurgitation. No evidence of mitral stenosis. 4. The aortic valve was not well visualized. Aortic valve regurgitation is mild. No aortic stenosis is present. 5. The inferior vena cava is normal in size with greater than 50% respiratory variability, suggesting right atrial pressure of 3 mmHg.      Disposition Plan  :    Status is: Inpatient  DVT Prophylaxis  :    Place and maintain sequential compression device Start: 02/05/24 0657 SCDs Start: 01/30/24 2330   Lab Results  Component Value Date   PLT 189 02/06/2024    Diet :  Diet Order             Diet NPO time specified Except for: Other (See Comments)  Diet effective now                    Inpatient Medications  Scheduled Meds:  ALPRAZolam   0.5 mg Oral BID   arformoterol   15 mcg Nebulization BID   budesonide  (PULMICORT ) nebulizer solution  0.25 mg Nebulization BID   Chlorhexidine  Gluconate Cloth  6 each Topical Daily   cloNIDine  0.1 mg Transdermal Q Wed   feeding supplement (PROSource TF20)  60 mL Per Tube BID   free water  150 mL Per Tube Q4H   hydrALAZINE  50 mg Oral Q8H   insulin  aspart  0-9 Units Subcutaneous Q4H   levothyroxine   150 mcg Oral Q0600   multivitamin with minerals  1 tablet Oral Daily   nicotine  14 mg  Transdermal Daily   mouth rinse  15 mL Mouth Rinse 4 times per day   pantoprazole  (PROTONIX ) IV  40 mg Intravenous QHS   PHENObarbital  65 mg Intravenous BID   revefenacin   175 mcg Nebulization Daily   sodium chloride  flush  10-40 mL Intracatheter Q12H   sodium chloride   1 g Oral BID   thiamine  100 mg Oral Daily   Continuous Infusions:  sodium chloride  75 mL/hr at 02/05/24 0855   cefTRIAXone (ROCEPHIN)  IV 1 g (02/06/24 1009)   feeding supplement (OSMOLITE 1.5 CAL) 1,000 mL (02/06/24 1344)   lacosamide (VIMPAT) IV 200 mg (02/06/24 1004)   valproate sodium  250 mg (02/06/24 1222)   PRN Meds:.docusate sodium , haloperidol lactate, hydrALAZINE, hydrALAZINE, labetalol, mouth rinse, polyethylene glycol, sodium chloride  flush, traMADol   Antibiotics  :    Anti-infectives (From admission, onward)    Start     Dose/Rate Route Frequency Ordered Stop   02/04/24 1030  cefTRIAXone (ROCEPHIN) 1 g in sodium chloride  0.9 % 100 mL IVPB        1 g 200 mL/hr over 30 Minutes Intravenous Every 24 hours 02/04/24 0937 02/09/24 1029         Objective:   Vitals:   02/06/24 0724 02/06/24 0800 02/06/24 1200 02/06/24 1336  BP: (!) 166/84 128/88 130/83 135/87  Pulse: 99 (!) 101    Resp: 16 17 16    Temp: 98.5 F (36.9  C)  98.2 F (36.8 C)   TempSrc: Oral  Oral   SpO2: 93% 95% 95%   Weight:      Height:        Wt Readings from Last 3 Encounters:  02/06/24 89.5 kg  09/06/23 94.8 kg  07/11/22 103.4 kg     Intake/Output Summary (Last 24 hours) at 02/06/2024 1412 Last data filed at 02/06/2024 0800 Gross per 24 hour  Intake 60 ml  Output 600 ml  Net -540 ml     Physical Exam  [place], but more interactive today, left periorbital bruising from his fall Symmetrical Chest wall movement, Good air movement bilaterally, CTAB RRR,No Gallops,Rubs or new Murmurs, No Parasternal Heave +ve B.Sounds, Abd Soft, No tenderness, No rebound - guarding or rigidity. No Cyanosis, Clubbing or edema,  difficult bruising in bilateral feet from his previous fall.    RN pressure injury documentation: Pressure Injury 01/31/24 Coccyx Mid Deep Tissue Pressure Injury - Purple or maroon localized area of discolored intact skin or blood-filled blister due to damage of underlying soft tissue from pressure and/or shear. (Active)  01/31/24 0100  Location: Coccyx  Location Orientation: Mid  Staging: Deep Tissue Pressure Injury - Purple or maroon localized area of discolored intact skin or blood-filled blister due to damage of underlying soft tissue from pressure and/or shear.  Wound Description (Comments):   Present on Admission: Yes  Dressing Type Foam - Lift dressing to assess site every shift 02/06/24 0800      Data Review:    Recent Labs  Lab 02/02/24 0549 02/03/24 0445 02/04/24 0436 02/05/24 0432 02/06/24 0443  WBC 14.7* 15.2* 16.5* 14.6* 10.6*  HGB 10.0* 10.0* 10.6* 10.5* 9.6*  HCT 27.8* 27.5* 29.7* 29.9* 28.2*  PLT 144* 157 162 195 189  MCV 86.6 87.9 86.8 87.9 91.0  MCH 31.2 31.9 31.0 30.9 31.0  MCHC 36.0 36.4* 35.7 35.1 34.0  RDW 14.6 15.7* 15.3 15.9* 16.3*  LYMPHSABS  --   --  3.2 3.2 2.9  MONOABS  --   --  2.5* 2.1* 1.4*  EOSABS  --   --  0.0 0.0 0.0  BASOSABS  --   --  0.0 0.0 0.0    Recent Labs  Lab 01/30/24 1952 01/30/24 1952 01/30/24 2020 01/30/24 2032 01/31/24 0528 02/01/24 6962 02/02/24 0549 02/03/24 0445 02/03/24 0837 02/04/24 0436 02/05/24 0432 02/05/24 0857 02/06/24 0443  NA 132*  --   --   --  127*   < > 126* 127*  --  128* 131*  --  132*  K 3.3*  --   --   --  3.3*   < > 3.7 3.8  --  3.3* 3.8  --  3.6  CL 96*  --   --   --  88*   < > 92* 93*  --  91* 97*  --  100  CO2 26  --   --   --  26   < > 24 22  --  26 23  --  21*  ANIONGAP 10  --   --   --  13   < > 10 12  --  11 11  --  11  GLUCOSE 134*  --   --   --  115*   < > 153* 156*  --  144* 151*  --  125*  BUN 20  --   --   --  14   < > 23 23  --  22 21  --  22  CREATININE 1.03  --   --   --   0.88   < > 0.58* 0.64  --  0.65 0.65  --  0.68  AST 22  --   --   --  24  --   --   --   --  36 50*  --  31  ALT 11  --   --   --  14  --   --   --   --  32 40  --  34  ALKPHOS 34*  --   --   --  44  --   --   --   --  47 53  --  54  BILITOT 1.2  --   --   --  1.3*  --   --   --   --  0.9 0.7  --  0.6  ALBUMIN 3.4*  --   --   --  4.0  --   --   --   --  3.5 3.4*  --  3.0*  CRP  --   --   --   --   --   --   --   --  1.0* 1.7* 1.9*  --  1.9*  PROCALCITON  --   --   --   --   --   --   --   --  0.38 0.14 0.10  --  0.24  LATICACIDVEN  --   --  2.1*  --   --   --   --   --   --   --   --   --   --   INR  --   --   --  1.1 1.0  --   --   --   --   --   --  1.0  --   TSH  --   --   --  48.029*  --   --   --  30.797*  --   --   --   --   --   HGBA1C  --   --   --   --  6.1*  --   --   --   --   --   --   --   --   AMMONIA  --   --   --  19  --   --   --   --   --   --   --   --   --   BNP  --   --   --   --   --   --   --  177.1*  --   --   --   --   --   MG  --    < >  --  1.5* 1.6*   < > 2.0 2.1  --  2.1 2.0  --  1.9  PHOS  --   --   --   --  3.0   < > 2.1* 2.7  --  3.1 3.5  --  3.4  CALCIUM  8.5*  --   --   --  9.0   < > 9.0 8.8*  --  9.2 9.1  --  8.4*   < > = values in this interval not displayed.      Recent Labs  Lab 01/30/24 1952 01/30/24 2020 01/30/24 2032 01/31/24 0528 02/01/24  8469 02/02/24 0549 02/03/24 0445 02/03/24 6295 02/04/24 0436 02/05/24 0432 02/05/24 0857 02/06/24 0443  CRP  --   --   --   --   --   --   --  1.0* 1.7* 1.9*  --  1.9*  PROCALCITON  --   --   --   --   --   --   --  0.38 0.14 0.10  --  0.24  LATICACIDVEN  --  2.1*  --   --   --   --   --   --   --   --   --   --   INR  --   --  1.1 1.0  --   --   --   --   --   --  1.0  --   TSH  --   --  48.029*  --   --   --  30.797*  --   --   --   --   --   HGBA1C  --   --   --  6.1*  --   --   --   --   --   --   --   --   AMMONIA  --   --  19  --   --   --   --   --   --   --   --   --   BNP  --   --   --   --    --   --  177.1*  --   --   --   --   --   MG   < >  --  1.5* 1.6*   < > 2.0 2.1  --  2.1 2.0  --  1.9  CALCIUM   --   --   --  9.0   < > 9.0 8.8*  --  9.2 9.1  --  8.4*   < > = values in this interval not displayed.    --------------------------------------------------------------------------------------------------------------- No results found for: "CHOL", "HDL", "LDLCALC", "LDLDIRECT", "TRIG", "CHOLHDL"  Lab Results  Component Value Date   HGBA1C 6.1 (H) 01/31/2024   No results for input(s): "TSH", "T4TOTAL", "FREET4", "T3FREE", "THYROIDAB" in the last 72 hours.     Micro Results Recent Results (from the past 240 hours)  Resp panel by RT-PCR (RSV, Flu A&B, Covid) Anterior Nasal Swab     Status: None   Collection Time: 01/30/24  8:10 PM   Specimen: Anterior Nasal Swab  Result Value Ref Range Status   SARS Coronavirus 2 by RT PCR NEGATIVE NEGATIVE Final   Influenza A by PCR NEGATIVE NEGATIVE Final   Influenza B by PCR NEGATIVE NEGATIVE Final    Comment: (NOTE) The Xpert Xpress SARS-CoV-2/FLU/RSV plus assay is intended as an aid in the diagnosis of influenza from Nasopharyngeal swab specimens and should not be used as a sole basis for treatment. Nasal washings and aspirates are unacceptable for Xpert Xpress SARS-CoV-2/FLU/RSV testing.  Fact Sheet for Patients: BloggerCourse.com  Fact Sheet for Healthcare Providers: SeriousBroker.it  This test is not yet approved or cleared by the [place]  FDA and has been authorized for detection and/or diagnosis of SARS-CoV-2 by FDA under an Emergency Use Authorization (EUA). This EUA will remain in effect (meaning this test can be used) for the duration of the COVID-19 declaration under Section 564(b)(1) of the Act, 21 U.S.C. section 360bbb-3(b)(1), unless the authorization is terminated or  revoked.     Resp Syncytial Virus by PCR NEGATIVE NEGATIVE Final    Comment: (NOTE) Fact  Sheet for Patients: BloggerCourse.com  Fact Sheet for Healthcare Providers: SeriousBroker.it  This test is not yet approved or cleared by the [place]  FDA and has been authorized for detection and/or diagnosis of SARS-CoV-2 by FDA under an Emergency Use Authorization (EUA). This EUA will remain in effect (meaning this test can be used) for the duration of the COVID-19 declaration under Section 564(b)(1) of the Act, 21 U.S.C. section 360bbb-3(b)(1), unless the authorization is terminated or revoked.  Performed at [facility], [address] [place], [place] [address]   Blood culture (routine x 2)     Status: None   Collection Time: 01/30/24  8:32 PM   Specimen: BLOOD RIGHT FOREARM  Result Value Ref Range Status   Specimen Description BLOOD RIGHT FOREARM  Final   Special Requests   Final    BOTTLES DRAWN AEROBIC AND ANAEROBIC Blood Culture results may not be optimal due to an inadequate volume of blood received in culture bottles   Culture   Final    NO GROWTH 5 DAYS Performed at [facility], [address], [place], [place] [address]    Report Status 02/04/2024 FINAL  Final  Blood culture (routine x 2)     Status: None   Collection Time: 01/30/24  8:40 PM   Specimen: BLOOD  Result Value Ref Range Status   Specimen Description BLOOD RIGHT ANTECUBITAL  Final   Special Requests   Final    BOTTLES DRAWN AEROBIC AND ANAEROBIC Blood Culture adequate volume   Culture   Final    NO GROWTH 5 DAYS Performed at [facility], [address], [place], [place] [address]    Report Status 02/04/2024 FINAL  Final  MRSA Next Gen by PCR, Nasal     Status: None   Collection Time: 01/31/24  1:03 AM   Specimen: Nasal Mucosa; Nasal Swab  Result Value Ref Range Status   MRSA by PCR Next Gen NOT DETECTED NOT DETECTED Final    Comment: (NOTE) The GeneXpert MRSA Assay (FDA approved for NASAL specimens only), is one  component of a comprehensive MRSA colonization surveillance program. It is not intended to diagnose MRSA infection nor to guide or monitor treatment for MRSA infections. Test performance is not FDA approved in patients less than 59 years old. Performed at [facility], [address], [place], [place] [address]   Urine Culture     Status: Abnormal   Collection Time: 02/03/24  8:22 AM   Specimen: Urine, Random  Result Value Ref Range Status   Specimen Description URINE, RANDOM  Final   Special Requests   Final    NONE Reflexed from [id number] Performed at [facility], [address] [place], [place] [address]    Culture >=100,000 COLONIES/mL ESCHERICHIA COLI (A)  Final   Report Status 02/05/2024 FINAL  Final   Organism ID, Bacteria ESCHERICHIA COLI (A)  Final      Susceptibility   Escherichia coli - MIC*    AMPICILLIN <=2 SENSITIVE Sensitive     CEFAZOLIN <=4 SENSITIVE Sensitive     CEFEPIME <=0.12 SENSITIVE Sensitive     CEFTRIAXONE <=0.25 SENSITIVE Sensitive     CIPROFLOXACIN  <=0.25 SENSITIVE Sensitive     GENTAMICIN <=1 SENSITIVE Sensitive     IMIPENEM <=0.25 SENSITIVE Sensitive     NITROFURANTOIN <=16 SENSITIVE Sensitive  TRIMETH /SULFA  <=20 SENSITIVE Sensitive     AMPICILLIN/SULBACTAM <=2 SENSITIVE Sensitive     PIP/TAZO <=4 SENSITIVE Sensitive ug/mL    * >=100,000 COLONIES/mL ESCHERICHIA COLI    Radiology Report US  EKG SITE RITE Result Date: 02/05/2024 If Site Rite image not attached, placement could not be confirmed due to current cardiac rhythm.  Overnight EEG with video Result Date: 02/05/2024 Arleene Lack, MD     02/06/2024  9:53 AM Patient Name: [name] MRN: [id number] Epilepsy Attending: Arleene Lack Referring Physician/Provider: Arleene Lack, MD Duration: 02/04/2024 1724 to 02/05/2024 1724 Patient history: 63yo M with right sub dural hematoma. EEG to evaluate for seizure  Level of alertness: Awake/lethargic  AEDs during EEG study: VPA,  Xanax , LCM  Technical aspects: This EEG study was done with scalp electrodes positioned according to the 10-20 International system of electrode placement. Electrical activity was reviewed with band pass filter of 1-70Hz , sensitivity of 7 uV/mm, display speed of 8mm/sec with a 60Hz  notched filter applied as appropriate. EEG data were recorded continuously and digitally stored.  Video monitoring was available and reviewed as appropriate.  Description: EEG showed continuous generalized and lateralized right hemisphere 3 to 7 Hz theta-delta slowing. Rhythmic sharply contoured 3-4hz  theta-delta slowing was noted in right hemisphere with waxing and waning morphology. Hyperventilation and photic stimulation were not performed.   Seizures without clinical signs were noted in right posterior quadrant. During seizure, eeg showed lateralized periodic discharges at 1.5-2hz  in right hemisphere, maximal right posterior quadrant which then evolved into 2.5-3hz  delta slowing admixed with spikes and involved left hemisphere. Average 3-6 seizures were noted per hour, lasting about 2 minutes each. As medications were adjusted, seizures resolved after around 1220 on 5/14/20255.  Subsequently EEG showed continuous generalized 3 to 6 Hz theta-delta slowing admixed with 12 to 13 Hz beta activity.  ABNORMALITY - Seizures without clinical signs, right posterior quadrant. - Lateralized rhythmic delta slowing, right hemisphere ( LRDA) - Continuous slow, generalized and lateralized right hemisphere  IMPRESSION: This study showed seizures without clinical signs arising from  right posterior quadrant. Average 3-6 seizures were noted per hour, lasting about 2 minutes each. As medications were adjusted, seizures resolved after around 1220 on 5/14/20255. Additionally there was cortical dysfunction arising from right hemisphere likely secondary to underlying structural abnormality/ subdural hematoma. Lastly there was moderate diffuse  encephalopathy.   Arleene Lack   EEG adult Result Date: 02/04/2024 Arleene Lack, MD     02/04/2024  4:44 PM Patient Name: [name] MRN: [id number] Epilepsy Attending: Arleene Lack Referring Physician/Provider: Cala Castleman, MD Date : 02/04/2024 Duration: 22.53 mins Patient history: 63yo M with right sub dural hematoma. EEG to evaluate for seizure Level of alertness: Awake AEDs during EEG study: VPA, Xanax  Technical aspects: This EEG study was done with scalp electrodes positioned according to the 10-20 International system of electrode placement. Electrical activity was reviewed with band pass filter of 1-70Hz , sensitivity of 7 uV/mm, display speed of 99mm/sec with a 60Hz  notched filter applied as appropriate. EEG data were recorded continuously and digitally stored.  Video monitoring was available and reviewed as appropriate. Description: EEG showed continuous generalized and lateralized right hemisphere 3 to 7 Hz theta-delta slowing. Rhythmic sharply contoured 3-4hz  theta-delta slowing was noted in right hemisphere with waxing and waning morphology. Hyperventilation and photic stimulation were not performed.   ABNORMALITY - Lateralized rhythmic delta slowing, right hemisphere ( LRDA) - Continuous slow, generalized and lateralized right hemisphere IMPRESSION: This study  is suggestive of cortical dysfunction arising from right hemisphere likely secondary to underlying structural abnormality/ subdural hematoma. This EEG pattern is on the ictal-interictal continuum with higher potential for seizure recurrence. Consider long term eeg monitoring if concern for ictal-interictal activity persists. Additionally there is moderate diffuse encephalopathy. No definite seizures were seen throughout the recording. Priyanka O Yadav    [employer]  -   [name] M.D on 02/06/2024 at 2:12 PM   -  To page go to [email or web address]

## 2024-02-06 NOTE — Progress Notes (Signed)
 MB performed maintenance on PZ, P4, A2 electrodes. All impedances are now below 10k ohms. Dr. Bonnita Buttner visited by bedside during maintenance.

## 2024-02-06 NOTE — Plan of Care (Signed)
  Problem: Education: Goal: Ability to describe self-care measures that may prevent or decrease complications (Diabetes Survival Skills Education) will improve Outcome: Progressing Goal: Individualized Educational Video(s) Outcome: Progressing   Problem: Coping: Goal: Ability to adjust to condition or change in health will improve Outcome: Progressing   Problem: Fluid Volume: Goal: Ability to maintain a balanced intake and output will improve Outcome: Progressing   Problem: Health Behavior/Discharge Planning: Goal: Ability to identify and utilize available resources and services will improve Outcome: Progressing Goal: Ability to manage health-related needs will improve Outcome: Progressing   Problem: Metabolic: Goal: Ability to maintain appropriate glucose levels will improve Outcome: Progressing   Problem: Nutritional: Goal: Maintenance of adequate nutrition will improve Outcome: Progressing   Problem: Education: Goal: Knowledge of General Education information will improve Description: Including pain rating scale, medication(s)/side effects and non-pharmacologic comfort measures Outcome: Progressing

## 2024-02-06 NOTE — Procedures (Addendum)
 Patient Name: [name]  MRN: [id number]  Epilepsy Attending: Arleene Lack  Referring Physician/Provider: Arleene Lack, MD  Duration: 02/05/2024 1724 to 02/06/2024 1724   Patient history: 63yo M with right sub dural hematoma. EEG to evaluate for seizure   Level of alertness: Awake, asleep   AEDs during EEG study: VPA, Xanax , LCM, Phenobarb   Technical aspects: This EEG study was done with scalp electrodes positioned according to the 10-20 International system of electrode placement. Electrical activity was reviewed with band pass filter of 1-70Hz , sensitivity of 7 uV/mm, display speed of 65mm/sec with a 60Hz  notched filter applied as appropriate. EEG data were recorded continuously and digitally stored.  Video monitoring was available and reviewed as appropriate.   Description: The posterior dominant rhythm consists of 8-9 Hz activity of moderate voltage (25-35 uV) seen predominantly in posterior head regions, symmetric and reactive to eye opening and eye closing. Sleep was characterized by vertex waves, sleep spindles (12 to 14 Hz), maximal frontocentral region. EEG showed intermittent generalized and lateralized right hemisphere 3 to 6 Hz theta-delta slowing. Hyperventilation and photic stimulation were not performed.     ABNORMALITY - Intermittent slow, generalized and lateralized right hemisphere  IMPRESSION: This study is suggestive of cortical dysfunction arising from right hemisphere, likely secondary to underlying structural abnormality. Additionally there is mild diffuse encephalopathy. No seizures or epileptiform discharges were seen throughout the recording.   Lakrista Scaduto O Cleotilde Spadaccini

## 2024-02-07 ENCOUNTER — Inpatient Hospital Stay (HOSPITAL_COMMUNITY): Payer: Medicare (Managed Care)

## 2024-02-07 DIAGNOSIS — R569 Unspecified convulsions: Secondary | ICD-10-CM | POA: Diagnosis not present

## 2024-02-07 DIAGNOSIS — S065XAA Traumatic subdural hemorrhage with loss of consciousness status unknown, initial encounter: Secondary | ICD-10-CM | POA: Diagnosis not present

## 2024-02-07 LAB — CBC WITH DIFFERENTIAL/PLATELET
Abs Immature Granulocytes: 0.12 10*3/uL — ABNORMAL HIGH (ref 0.00–0.07)
Basophils Absolute: 0 10*3/uL (ref 0.0–0.1)
Basophils Relative: 0 %
Eosinophils Absolute: 0 10*3/uL (ref 0.0–0.5)
Eosinophils Relative: 0 %
HCT: 25.5 % — ABNORMAL LOW (ref 39.0–52.0)
Hemoglobin: 8.7 g/dL — ABNORMAL LOW (ref 13.0–17.0)
Immature Granulocytes: 1 %
Lymphocytes Relative: 25 %
Lymphs Abs: 2.6 10*3/uL (ref 0.7–4.0)
MCH: 31.5 pg (ref 26.0–34.0)
MCHC: 34.1 g/dL (ref 30.0–36.0)
MCV: 92.4 fL (ref 80.0–100.0)
Monocytes Absolute: 1.4 10*3/uL — ABNORMAL HIGH (ref 0.1–1.0)
Monocytes Relative: 14 %
Neutro Abs: 6.1 10*3/uL (ref 1.7–7.7)
Neutrophils Relative %: 60 %
Platelets: 239 10*3/uL (ref 150–400)
RBC: 2.76 MIL/uL — ABNORMAL LOW (ref 4.22–5.81)
RDW: 16.6 % — ABNORMAL HIGH (ref 11.5–15.5)
WBC: 10.3 10*3/uL (ref 4.0–10.5)
nRBC: 0.5 % — ABNORMAL HIGH (ref 0.0–0.2)

## 2024-02-07 LAB — GLUCOSE, CAPILLARY
Glucose-Capillary: 162 mg/dL — ABNORMAL HIGH (ref 70–99)
Glucose-Capillary: 153 mg/dL — ABNORMAL HIGH (ref 70–99)
Glucose-Capillary: 128 mg/dL — ABNORMAL HIGH (ref 70–99)
Glucose-Capillary: 158 mg/dL — ABNORMAL HIGH (ref 70–99)
Glucose-Capillary: 159 mg/dL — ABNORMAL HIGH (ref 70–99)
Glucose-Capillary: 157 mg/dL — ABNORMAL HIGH (ref 70–99)

## 2024-02-07 LAB — COMPREHENSIVE METABOLIC PANEL WITH GFR
ALT: 31 U/L (ref 0–44)
AST: 38 U/L (ref 15–41)
Albumin: 2.7 g/dL — ABNORMAL LOW (ref 3.5–5.0)
Alkaline Phosphatase: 53 U/L (ref 38–126)
Anion gap: 7 (ref 5–15)
BUN: 21 mg/dL (ref 8–23)
CO2: 22 mmol/L (ref 22–32)
Calcium: 8.1 mg/dL — ABNORMAL LOW (ref 8.9–10.3)
Chloride: 104 mmol/L (ref 98–111)
Creatinine, Ser: 0.57 mg/dL — ABNORMAL LOW (ref 0.61–1.24)
GFR, Estimated: >60 mL/min (ref >60)
Glucose, Bld: 167 mg/dL — ABNORMAL HIGH (ref 70–99)
Potassium: 3.4 mmol/L — ABNORMAL LOW (ref 3.5–5.1)
Sodium: 133 mmol/L — ABNORMAL LOW (ref 135–145)
Total Bilirubin: 0.7 mg/dL (ref 0.0–1.2)
Total Protein: 6.3 g/dL — ABNORMAL LOW (ref 6.5–8.1)

## 2024-02-07 LAB — MAGNESIUM: Magnesium: 1.9 mg/dL (ref 1.7–2.4)

## 2024-02-07 LAB — C-REACTIVE PROTEIN: CRP: 3 mg/dL — ABNORMAL HIGH (ref <1.0)

## 2024-02-07 LAB — PHOSPHORUS: Phosphorus: 2.5 mg/dL (ref 2.5–4.6)

## 2024-02-07 LAB — PROCALCITONIN: Procalcitonin: 0.14 ng/mL

## 2024-02-07 MED ORDER — METOPROLOL TARTRATE 25 MG PO TABS
25.0000 mg | ORAL_TABLET | Freq: Two times a day (BID) | ORAL | Status: DC
  Administered 2024-02-07 – 2024-02-09 (×4): 25 mg
  Filled 2024-02-07 (×4): qty 1

## 2024-02-07 MED ORDER — LACOSAMIDE 50 MG PO TABS
200.0000 mg | ORAL_TABLET | Freq: Two times a day (BID) | ORAL | Status: DC
  Administered 2024-02-07 – 2024-02-11 (×9): 200 mg via NASOGASTRIC
  Filled 2024-02-07 (×9): qty 4

## 2024-02-07 MED ORDER — PHENOBARBITAL 32.4 MG PO TABS
64.8000 mg | ORAL_TABLET | Freq: Two times a day (BID) | ORAL | Status: DC
  Administered 2024-02-07 – 2024-02-11 (×9): 64.8 mg
  Filled 2024-02-07 (×9): qty 2

## 2024-02-07 MED ORDER — FUROSEMIDE 10 MG/ML IJ SOLN
40.0000 mg | Freq: Once | INTRAMUSCULAR | Status: AC
  Administered 2024-02-07: 40 mg via INTRAVENOUS
  Filled 2024-02-07: qty 4

## 2024-02-07 MED ORDER — POTASSIUM CHLORIDE 20 MEQ PO PACK
40.0000 meq | PACK | Freq: Once | ORAL | Status: AC
  Administered 2024-02-07: 40 meq
  Filled 2024-02-07: qty 2

## 2024-02-07 MED ORDER — METOPROLOL TARTRATE 12.5 MG HALF TABLET
12.5000 mg | ORAL_TABLET | Freq: Two times a day (BID) | ORAL | Status: DC
  Administered 2024-02-07: 12.5 mg
  Filled 2024-02-07: qty 1

## 2024-02-07 NOTE — TOC Progression Note (Signed)
 Transition of Care [facility]) - Progression Note    Patient Details  Name: [name] MRN: [id number] Date of Birth: 07/11/1961  Transition of Care [facility]) CM/SW Contact  Jannice Mends, LCSW Phone Number: 02/07/2024, 8:35 AM  Clinical Narrative:    CSW continuing to follow for medical stability. Pasrr is currently under in-person review.    Expected Discharge Plan: Skilled Nursing Facility Barriers to Discharge: Awaiting State Approval (PASRR), Continued Medical Work up, SNF Pending bed offer  Expected Discharge Plan and Services In-house Referral: Clinical Social Work   Post Acute Care Choice: Skilled Nursing Facility Living arrangements for the past 2 months: Single Family Home                                       Social Determinants of Health (SDOH) Interventions SDOH Screenings   Food Insecurity: Patient Unable To Answer (02/02/2024)  Housing: Patient Unable To Answer (02/02/2024)  Transportation Needs: Patient Unable To Answer (02/02/2024)  Utilities: Patient Unable To Answer (02/02/2024)  Financial Resource Strain: Low Risk  (12/31/2022)   Received from [facility]  Physical Activity: Unknown (03/27/2023)   Received from [facility]  Social Connections: Somewhat Isolated (03/27/2023)   Received from [facility]  Stress: Stress Concern Present (03/27/2023)   Received from [facility]  Tobacco Use: High Risk (01/30/2024)    Readmission Risk Interventions     No data to display

## 2024-02-07 NOTE — Progress Notes (Signed)
 Speech Language Pathology Treatment: Dysphagia  Patient Details Name: [name] MRN: [id number] DOB: 1960-10-10 Today's Date: 02/07/2024 Time: 4010-2725 SLP Time Calculation (min) (ACUTE ONLY): 31 min  Assessment / Plan / Recommendation Clinical Impression  Pt seen at bedside to assess readiness for PO intake. [name] present during session. Pt kept his eyes closed throughout this session, but was verbally responsive and followed some commands. Oral care was completed with suction. Volitional cough weak and congested. Voice quality clear, speech intelligible. Pt accepted trials of ice chips, thin liquid, and puree. Weak cough noted following thin liquid and puree trials. Recommend instrumental assessment prior to initiating PO diet. Also recommend continuing with Cortrak for primary nutrition/hydration/medication with ice chips given following thorough oral care, when pt is fully awake, alert, and seated upright. Wife reports pt's birthday is [birth year], and she would love for him to have ice cream or something that day. SLP will reassess at bedside Monday, and will proceed with MBS if EEG has been completed and pt is appropriate to proceed with objective assessment.   HPI HPI: Pt is a 63 y.o. male who was brought to ED by EMS from home after a fall. CT head (01/30/23) revealed "Slightly increased size of right larger than left subdural hematomas. Small volume subarachnoid hemorrhage with mildly increased hemorrhage in the left lateral ventricle. Failed yale 02/01/24. PMH: Seizure disorder, COPD; ST f/u for po readiness in acute setting; pt currently has TF for nutritive/hydration purposes.      SLP Plan  Continue with current plan of care      Recommendations for follow up therapy are one component of a multi-disciplinary discharge planning process, led by the attending physician.  Recommendations may be updated based on patient status, additional functional criteria and insurance authorization.     Recommendations  Diet recommendations: NPO;Other(comment) (ice chips after oral care when pt alert and upright.) Supervision: Full supervision/cueing for compensatory strategies;Trained caregiver to feed patient Compensations: Slow rate;Small sips/bites;Minimize environmental distractions Postural Changes and/or Swallow Maneuvers: Seated upright 90 degrees                  Oral care QID;Oral care prior to ice chip/H20;Staff/trained caregiver to provide oral care   Frequent or constant Supervision/Assistance Dysphagia, unspecified (R13.10)     Continue with current plan of care    Shontez Sermon B. Arby Beam, [facility], CCC-SLP Speech Language Pathologist Office: [phone]  Adine Ahmadi 02/07/2024, 10:20 AM

## 2024-02-07 NOTE — Progress Notes (Signed)
 NEUROLOGY CONSULT FOLLOW UP NOTE   Date of service: Feb 07, 2024 Patient Name: [name] MRN:  [id number] DOB:  [birth year]  Interval Hx/subjective   Seen and examined. Wife bedside for support and aware of plan.   Patient following commands, arousable to voice and touch, opens eyes spontaneously to voice, more alert today. Still intermittently drowsy, wife states more alert today. Seizure free for 48 hours.   Vitals   Vitals:   02/07/24 0339 02/07/24 0408 02/07/24 0604 02/07/24 0750  BP: (!) 140/82  (!) 141/86 (!) 106/90  Pulse:    (!) 105  Resp:    16  Temp:    98.7 F (37.1 C)  TempSrc:    Oral  SpO2:    95%  Weight:  94.2 kg    Height:         Body mass index is 33.52 kg/m.  Physical Exam  General: Mildly drowsy, does open eyes to voice HEENT: Bruising on the left eye and forehead CVS: tachycardic Arousable to voice and touch. Oriented to person, place and year.  Cranial nerves: Pupils are equal round reactive to light, extraocular movements difficult to assess due to variable effort but gaze is midline, right eye slightly upward. left eyelid has a lot of swelling, facial sensation and symmetry seem preserved. Shoulder shrug intact. Motor examination reveals diminished left upper extremity strength but that is due to shoulder pain and has been baseline for him.  Right upper extremity appears full strength. Grip strength intact bilaterally. Able to lift left lower extremity to gravity and unable to lift right lower extremity to gravity today.   Sensory exam: As above  Medications  Current Facility-Administered Medications:    0.9 %  sodium chloride  infusion, , Intravenous, Continuous, Elgergawy, Ardia Kraft, MD, Last Rate: 75 mL/hr at 02/07/24 0343, New Bag at 02/07/24 0343   ALPRAZolam  (XANAX ) tablet 0.5 mg, 0.5 mg, Oral, BID, Young Hensen, RPH, 0.5 mg at 02/06/24 2201   arformoterol  (BROVANA ) nebulizer solution 15 mcg, 15 mcg, Nebulization, BID, Shelli Dexter M,  PA-C, 15 mcg at 02/07/24 6962   budesonide  (PULMICORT ) nebulizer solution 0.25 mg, 0.25 mg, Nebulization, BID, Shelli Dexter M, PA-C, 0.25 mg at 02/07/24 0813   cefTRIAXone (ROCEPHIN) 1 g in sodium chloride  0.9 % 100 mL IVPB, 1 g, Intravenous, Q24H, Cala Castleman, MD, Last Rate: 200 mL/hr at 02/06/24 1009, 1 g at 02/06/24 1009   Chlorhexidine  Gluconate Cloth 2 % PADS 6 each, 6 each, Topical, Daily, Alva, Rakesh V, MD, 6 each at 02/06/24 1013   cloNIDine (CATAPRES - Dosed in mg/24 hr) patch 0.1 mg, 0.1 mg, Transdermal, Q Wed, Elgergawy, Dawood S, MD, 0.1 mg at 02/05/24 1150   docusate sodium  (COLACE) capsule 100 mg, 100 mg, Oral, BID PRN, Shelli Dexter M, PA-C   feeding supplement (OSMOLITE 1.5 CAL) liquid 1,000 mL, 1,000 mL, Per Tube, Continuous, Elgergawy, Ardia Kraft, MD, Last Rate: 55 mL/hr at 02/06/24 1344, 1,000 mL at 02/06/24 1344   feeding supplement (PROSource TF20) liquid 60 mL, 60 mL, Per Tube, BID, Elgergawy, Ardia Kraft, MD, 60 mL at 02/06/24 2202   free water 150 mL, 150 mL, Per Tube, Q4H, Elgergawy, Ardia Kraft, MD, 150 mL at 02/07/24 0752   haloperidol lactate (HALDOL) injection 2 mg, 2 mg, Intramuscular, Q6H PRN, Singh, Prashant K, MD   hydrALAZINE (APRESOLINE) injection 10 mg, 10 mg, Intravenous, Q4H PRN, Ogan, Okoronkwo U, MD, 10 mg at 02/06/24 0801   hydrALAZINE (APRESOLINE) injection 5 mg, 5  mg, Intravenous, Q4H PRN, Elgergawy, Ardia Kraft, MD, 5 mg at 02/07/24 0818   hydrALAZINE (APRESOLINE) tablet 50 mg, 50 mg, Oral, Q8H, Young Hensen, RPH, 50 mg at 02/07/24 0604   insulin  aspart (novoLOG ) injection 0-9 Units, 0-9 Units, Subcutaneous, Q4H, Reese, Stephanie M, PA-C, 2 Units at 02/07/24 0820   labetalol (NORMODYNE) injection 10 mg, 10 mg, Intravenous, Q2H PRN, Paliwal, Aditya, MD, 10 mg at 02/01/24 2047   lacosamide (VIMPAT) 200 mg in sodium chloride  0.9 % 25 mL IVPB, 200 mg, Intravenous, Q12H, Shafer, Sim Dryer, NP, Last Rate: 90 mL/hr at 02/06/24 2211, 200 mg at 02/06/24 2211    levothyroxine  (SYNTHROID ) tablet 150 mcg, 150 mcg, Oral, Q0600, Young Hensen, RPH, 150 mcg at 02/07/24 0604   multivitamin with minerals tablet 1 tablet, 1 tablet, Oral, Daily, Young Hensen, RPH, 1 tablet at 02/06/24 1014   nicotine (NICODERM CQ - dosed in mg/24 hours) patch 14 mg, 14 mg, Transdermal, Daily, Ogan, Okoronkwo U, MD, 14 mg at 02/06/24 1011   Oral care mouth rinse, 15 mL, Mouth Rinse, 4 times per day, Trevor Fudge, MD, 15 mL at 02/06/24 2205   Oral care mouth rinse, 15 mL, Mouth Rinse, PRN, Trevor Fudge, MD, 15 mL at 02/02/24 0529   pantoprazole  (PROTONIX ) injection 40 mg, 40 mg, Intravenous, QHS, Reese, Stephanie M, PA-C, 40 mg at 02/06/24 2201   PHENObarbital (LUMINAL) injection 65 mg, 65 mg, Intravenous, BID, Jessabelle Markiewicz, MD, 65 mg at 02/06/24 2205   polyethylene glycol (MIRALAX  / GLYCOLAX ) packet 17 g, 17 g, Oral, Daily PRN, Alayne Hubert, Stephanie M, PA-C   revefenacin  (YUPELRI ) nebulizer solution 175 mcg, 175 mcg, Nebulization, Daily, Shelli Dexter M, PA-C, 175 mcg at 02/07/24 0813   sodium chloride  flush (NS) 0.9 % injection 10-40 mL, 10-40 mL, Intracatheter, Q12H, Elgergawy, Ardia Kraft, MD, 20 mL at 02/06/24 2202   sodium chloride  flush (NS) 0.9 % injection 10-40 mL, 10-40 mL, Intracatheter, PRN, Elgergawy, Ardia Kraft, MD   sodium chloride  tablet 1 g, 1 g, Oral, BID, Young Hensen, RPH, 1 g at 02/06/24 2202   thiamine (VITAMIN B1) tablet 100 mg, 100 mg, Oral, Daily, Young Hensen, RPH, 100 mg at 02/06/24 1014   traMADol  (ULTRAM ) tablet 50 mg, 50 mg, Oral, Q6H PRN, Young Hensen, RPH   valproate (DEPACON ) 250 mg in dextrose  5 % 50 mL IVPB, 250 mg, Intravenous, Q12H, Brayton Calin, MD, Last Rate: 52.5 mL/hr at 02/06/24 2209, 250 mg at 02/06/24 2209  Labs and Diagnostic Imaging   CBC:  Recent Labs  Lab 02/06/24 0443 02/07/24 0436  WBC 10.6* 10.3  NEUTROABS 6.2 6.1  HGB 9.6* 8.7*  HCT 28.2* 25.5*  MCV 91.0 92.4  PLT 189 239    Basic Metabolic Panel:  Lab Results   Component Value Date   NA 133 (L) 02/07/2024   K 3.4 (L) 02/07/2024   CO2 22 02/07/2024   GLUCOSE 167 (H) 02/07/2024   BUN 21 02/07/2024   CREATININE 0.57 (L) 02/07/2024   CALCIUM  8.1 (L) 02/07/2024   GFRNONAA >60 02/07/2024   GFRAA >60 04/19/2019   Lipid Panel: No results found for: "LDLCALC" HgbA1c:  Lab Results  Component Value Date   HGBA1C 6.1 (H) 01/31/2024   Urine Drug Screen:     Component Value Date/Time   LABOPIA NONE DETECTED 01/30/2024 2003   COCAINSCRNUR NONE DETECTED 01/30/2024 2003   LABBENZ POSITIVE (A) 01/30/2024 2003   AMPHETMU NONE DETECTED 01/30/2024 2003   THCU NONE DETECTED 01/30/2024 2003   LABBARB NONE  DETECTED 01/30/2024 2003    Alcohol Level     Component Value Date/Time   [facility] <15 01/30/2024 2032   INR  Lab Results  Component Value Date   INR 1.0 02/05/2024   APTT  Lab Results  Component Value Date   APTT 24 02/05/2024   AED levels:  Lab Results  Component Value Date   LEVETIRACETA <2.0 (L) 01/30/2024    CT Head without contrast(Personally reviewed): 1. Acute 9 mm thick right cerebral convexity subdural hemorrhage which extends along the right tentorial leaflet. Resulting 3 mm leftward midline shift. 2. Scattered small volume of acute subarachnoid hemorrhage. 3. No evidence of acute fracture or traumatic malalignment in the cervical spine.  Repeat CT head 5/13 1. Largely resolved left side but Increased Right Side Subdural Hematoma since 01/31/2024. Right SDH now up to 11 mm with increased leftward midline shift, (now 4-5 mm) and increased mass effect on the right lateral ventricle. 2. Small volume SAH now apparent. Stable small volume IVH. No acute ventriculomegaly. Possible small superimposed right posterior hemisphere hemorrhagic contusion. 3. No skull fracture identified.   rEEG: 02/04/2024  This study is suggestive of cortical dysfunction arising from right hemisphere likely secondary to underlying structural abnormality/  subdural hematoma. This EEG pattern is on the ictal-interictal continuum with higher potential for seizure recurrence. Consider long term eeg monitoring if concern for ictal-interictal activity persists.   cEEG: 02/04/2024 1724 to 02/05/2024 0845  ABNORMALITY - Seizures without clinical signs, right posterior quadrant.  - Lateralized rhythmic delta slowing, right hemisphere ( LRDA) - Continuous slow, generalized and lateralized right hemisphere IMPRESSION: This study showed seizures without clinical signs arising from  right posterior quadrant. Average 3-6 seizures were noted per hour, lasting about 2 minutes each. Additionally there was cortical dysfunction arising from right hemisphere likely secondary to underlying structural abnormality/ subdural hematoma. Lastly there was moderate diffuse encephalopathy.    cEEG Duration: 02/05/2024 1724 to 02/06/2024 1000  This study is suggestive of cortical dysfunction arising from right hemisphere, likely secondary to underlying structural abnormality. Additionally there is mild diffuse encephalopathy. No seizures or epileptiform discharges were seen throughout the recording.   Duration: 02/06/2024 1724 to 02/07/2024 0530  Description: The posterior dominant rhythm consists of 8-9 Hz activity of moderate voltage (25-35 uV) seen predominantly in posterior head regions, symmetric and reactive to eye opening and eye closing. Sleep was characterized by vertex waves, sleep spindles (12 to 14 Hz), maximal frontocentral region. EEG showed intermittent generalized and lateralized right hemisphere 3 to 6 Hz theta-delta slowing. Hyperventilation and photic stimulation were not performed.      ABNORMALITY - Intermittent slow, generalized and lateralized right hemisphere   IMPRESSION: This study is suggestive of cortical dysfunction arising from right hemisphere, likely secondary to underlying structural abnormality. Additionally there is mild diffuse encephalopathy. No  seizures or epileptiform discharges were seen throughout the recording.  Assessment   Caidence Fenelon is a 63 y.o. male past history of COPD, anxiety, hypothyroidism, diabetes, hypertension, lipidemia and seizure disorder admitted for multiple falls and found to have right greater than left subdural hematomas with a leftward midline shift which has worsened over the past few days. Neurology was consulted for worsening mentation. His CT head on 5/13  also reveals worsening of the right-sided subdural and worsening right-to-left midline shift. Prior EEG on 5/14 shows 3-6 seizures per hour each lasting approximately 2 minutes. He continued on the Depakote  500 mg twice daily, lacosamide increased to 200 mg twice daily and he was  loaded with phenobarbital. Patient continues to stabilize and is more alert per wife this morning. Is seizure free for last 48 hours and will discontinue LTM. Will discontinue Depakote  and re-check phenobarb level.    Impression: Breakthrough seizures in the setting of subdural hematoma, subdural hematoma  Recommendations  Discontinue LTM EEG, without seizures last 48 hours.  Continue phenobarbital 65 twice daily, order changed to per tube   Repeat phenobarb level at 2100  Continue Lacosamide to 200 mg twice daily, changed to to per NG tube   Discontinued Depacon  250 mg twice daily, due to interaction with phenobarb VPA level already getting subtherapeutic. Appreciate neurosurgical consideration for hematoma evacuation Will follow the levels of the phenobarbital and update as needed Plan discussed with Dr. Osborne Blazer  ---- Brayton Calin, MD [facility] Psychiat[facility] Resident PGY-1   Attending Neurohospitalist Addendum Patient seen and examined with APP/Resident. Agree with the history and physical as documented above. Agree with the plan as documented, which I helped formulate. I have independently reviewed the chart, obtained history, review of systems and examined the  patient.I have personally reviewed pertinent head/neck/spine imaging (CT/MRI). Please feel free to call with any questions.  -- Tona Francis, MD Neurologist [facility] Pager: [phone]

## 2024-02-07 NOTE — Evaluation (Signed)
 Physical Therapy Re-Evaluation Patient Details Name: [name] MRN: [id number] DOB: 1961-04-19 Today's Date: 02/07/2024  History of Present Illness  Pt is a 63 y.o. male presenting 5/8 after a fall at home. UDS positive for benzodiazepines. EKG with sinus bradycardia. Head CT 5/8 showed 9 mm right subdural hematoma with 3 mm midline shift and small volume acute SAH.  Pt on continuous EEG 5/14 with continued seizures.  PMH significant for seizures and COPD.  Clinical Impression  Pt seen for PT re-evaluation with pt received in room. Pt with significant generalized edema (BUE, eyes swollen shut). Pt responds to PT intermittently throughout session, able to state his name, the year, the location. Pt with poor ability to follow simple commands throughout session. Pt requires +2 for supine<>sit & sits EOB ~1 minute with max assist 2/2 absent righting reactions & ability to correct posterior lean. Pt repositioned in bed with all needs met. Pt would benefit from ongoing PT service to progress mobility as able to increase independence with mobility & reduce caregiver burden.        If plan is discharge home, recommend the following: Two people to help with walking and/or transfers;Two people to help with bathing/dressing/bathroom   Can travel by private vehicle   No    Equipment Recommendations Other (comment) (defer to next venue)  Recommendations for Other Services  Rehab consult    Functional Status Assessment Patient has had a recent decline in their functional status and demonstrates the ability to make significant improvements in function in a reasonable and predictable amount of time.     Precautions / Restrictions Precautions Precautions: Fall Precaution/Restrictions Comments: NG tube Restrictions Weight Bearing Restrictions Per Provider Order: No      Mobility  Bed Mobility Overal bed mobility: Needs Assistance Bed Mobility: Rolling, Supine to Sit, Sit to Supine Rolling:  Total assist, +2 for physical assistance, +2 for safety/equipment, Used rails   Supine to sit: Total assist, HOB elevated, Used rails, +2 for physical assistance, +2 for safety/equipment Sit to supine: Total assist, +2 for physical assistance, +2 for safety/equipment, HOB elevated, Used rails   General bed mobility comments: +2 to scoot to [facility]    Transfers                        Ambulation/Gait                  Stairs            Wheelchair Mobility     Tilt Bed    Modified Rankin (Stroke Patients Only)       Balance Overall balance assessment: Independent Sitting-balance support: Bilateral upper extremity supported, Single extremity supported, Feet supported Sitting balance-Leahy Scale: Zero Sitting balance - Comments: posterior lean, absent righting reactions                                     Pertinent Vitals/Pain Pain Assessment Pain Assessment: Faces Faces Pain Scale: No hurt    Home Living Family/patient expects to be discharged to:: Private residence Living Arrangements: Spouse/significant other Available Help at Discharge: Family;Available 24 hours/day Type of Home: House Home Access: Stairs to enter Entrance Stairs-Rails: Right Entrance Stairs-Number of Steps: 4   Home Layout: One level Home Equipment: Rollator (4 wheels);Cane - single point;BSC/3in1;Standard Environmental consultant;Shower seat Additional Comments: pt not able to answer questions, per wife, pt has wife and daughter,  neither present during session.    Prior Function Prior Level of Function : Patient poor historian/Family not available             Mobility Comments: reports rollator use, no family present to confirm. RN reports repeated falls       Extremity/Trunk Assessment   Upper Extremity Assessment Upper Extremity Assessment: Generalized weakness;Difficult to assess due to impaired cognition LUE Deficits / Details: Pt squeezing PT's hands each time PT  tried to move/reposition pt's BUE.    Lower Extremity Assessment Lower Extremity Assessment: Difficult to assess due to impaired cognition;Generalized weakness       Communication   Communication Communication: No apparent difficulties    Cognition Arousal: Lethargic Behavior During Therapy: Flat affect   PT - Cognitive impairments: Difficult to assess, Orientation, Awareness, Memory, Sequencing, Problem solving, Safety/Judgement, Attention, Initiation, No family/caregiver present to determine baseline   Orientation impairments: Place, Time                   PT - Cognition Comments: Oriented to self (name), place, year. Following commands: Impaired Following commands impaired: Follows one step commands with increased time, Follows one step commands inconsistently     Cueing Cueing Techniques: Verbal cues, Tactile cues     General Comments      Exercises     Assessment/Plan    PT Assessment Patient needs continued PT services  PT Problem List Decreased strength;Decreased range of motion;Decreased activity tolerance;Decreased balance;Decreased mobility;Decreased cognition;Decreased knowledge of use of DME;Decreased safety awareness;Decreased knowledge of precautions;Decreased coordination       PT Treatment Interventions DME instruction;Balance training;Modalities;Gait training;Neuromuscular re-education;Functional mobility training;Therapeutic activities;Therapeutic exercise;Manual techniques;Patient/family education;Cognitive remediation    PT Goals (Current goals can be found in the Care Plan section)  Acute Rehab PT Goals Patient Stated Goal: none stated PT Goal Formulation: Patient unable to participate in goal setting Time For Goal Achievement: 02/21/24 Potential to Achieve Goals: Fair    Frequency Min 2X/week     Co-evaluation               AM-PAC PT "6 Clicks" Mobility  Outcome Measure Help needed turning from your back to your side while  in a flat bed without using bedrails?: Total Help needed moving from lying on your back to sitting on the side of a flat bed without using bedrails?: Total Help needed moving to and from a bed to a chair (including a wheelchair)?: Total Help needed standing up from a chair using your arms (e.g., wheelchair or bedside chair)?: Total Help needed to walk in hospital room?: Total Help needed climbing 3-5 steps with a railing? : Total 6 Click Score: 6    End of Session   Activity Tolerance: Patient tolerated treatment well Patient left: in bed;with call bell/phone within reach;with bed alarm set;with SCD's reapplied Nurse Communication: Mobility status PT Visit Diagnosis: Unsteadiness on feet (R26.81);Muscle weakness (generalized) (M62.81);History of falling (Z91.81);Other abnormalities of gait and mobility (R26.89);Difficulty in walking, not elsewhere classified (R26.2)    Time: 7829-5621 PT Time Calculation (min) (ACUTE ONLY): 19 min   Charges:   PT Evaluation $PT Re-evaluation: 1 Re-eval   PT General Charges $$ ACUTE PT VISIT: 1 Visit         Emaline Handsome, PT, DPT 02/07/24, 3:05 PM   Venetta Gill 02/07/2024, 3:03 PM

## 2024-02-07 NOTE — Progress Notes (Addendum)
 PROGRESS NOTE                                                                                                                                                                                                             Patient Demographics:    Ruby Wheelhouse, is a 63 y.o. male, DOB - 1961/04/27, ZOX:[id number]  Outpatient Primary MD for the patient is Claudell Cruz, MD    LOS - 8  Admit date - 01/30/2024    Chief Complaint  Patient presents with   Fall   Altered Mental Status       Brief Narrative (HPI from H&P)    63 year old man who was brought in by EMS from home after a fall.  Apparently wife called, patient is currently ANO x 1 and history obtained after chart review, unable to reach wife.  His heart rate initially was in the 80s, A/O x 1 per EMS, bradycardic to 40s in the ED , noted to be hypothermic 94.  Bruises of multiple ages left hip left eye and left shoulder Labs significant for no leukocytosis, hemoglobin 9.3, mild thrombocytopenia 1 27K, mild hypokalemia 3.3, hypomagnesemia 1.5, normal LFTs.  UDS positive for benzodiazepines, tox screen negative, ammonia 19, TSH 48.  EKG showed sinus bradycardia.  He was given 1 g of magnesium , 100 mg of IV Solu-Cortef  and 200 mics of levothyroxine  IV. Head CT showed 9 mm right subdural hematoma with 3 mm midline shift and small volume acute SAH, cervical spine was negative.  He was admitted to ICU by pulmonary critical care, seen by neurosurgeon Dr. Michale Age, stabilized and transferred to my care on 02/03/2024 on day 4 of his hospital stay, still little confused with NG tube in.  Significant Hospital Events: Including procedures, antibiotic start and stop dates in addition to other pertinent events   5/8 Admitted 5/9 No overnight issues, patient remained confused and restless, trying to get out of bed 5/10 ongoing issues with agitation overnight 5/11 mentation improved this a.m.  with less impulsivity 02/03/2024 transferred to [facility] 02/04/2024 LTM EEG   Subjective:   No significant events overnight as discussed with staff, he was started on tube feed yesterday    Assessment  & Plan :    Acute right subdural hematoma with 3 mm midline shift - Acute traumatic subarachnoid hemorrhage status post fall  -  Repeat head CT 5/9 with slightly increased size of right larger than left subdural hematomas with 3 mm leftward midline shift, neurosurgery on board, appreciate their input.  - repeat CT scan on 02/04/2024 done as he was slightly drowsy, mild worsening on the right sided SDH. - Discussed with neurosurgery 5/14, they would like to avoid subjecting him to surgery and a palliative procedure unless he fails maximal medical treatment for seizures   Acute metabolic encephalopathy. - This is multifactorial, in the setting of subdural hematoma, seizures, and multiple AEDs - Seizure input greatly appreciated, will keep close observation given multiple AEDs and encephalopathy - Patient continues to improve on current AED regimen.   -continue with tube feed for now, MBS on Monday.    Breakthrough seizures in the setting of subdural hematoma Controlled seizures at baseline -LTM EEG - Management per neurology, did show significant improvement after starting phenobarbital, LTM with no further seizures over last 48 hours, so recommendation is to continue with phenobarbital 65 mg twice daily.  Tube, (to monitor level closely), continue with Vimpat, and Depacon  has been discontinued due to interaction with phenobarbital.      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  Counseled on compliance.   Bradycardia/hypothermia due to hypothyroidism - resolved - With initiation of IV Synthroid  bradycardia has now resolved, continue to monitor  on telemetry.  Echo stable.   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   - continue with Rocephin   Hypertension - As n.p.o. initially he was n.p.o. and hydralazine and clonidine patch, now has NG tube, remains elevated so continue with hydralazine scheduled, will add as metoprolol and uptitrate as tolerated .  Anemia - Monitor closely, no indication for transfusion, some dilutional effect.  Prediabetes P: Continue SSI CBG goal 140-180 CBG checks every 4  Lab Results  Component Value Date   HGBA1C 6.1 (H) 01/31/2024   CBG (last 3)  Recent Labs    02/07/24 0324 02/07/24 0751 02/07/24 1152  GLUCAP 162* 153* 128*          Condition -  Guarded  Family Communication  :  Discussed with wife at bedside daily  Code Status : Full code  Consults  : PCCM, neurosurgery Dr. Michale Age, neurology, nephrology  PUD Prophylaxis : PPI   Procedures  :     EEG 02/03/2024.  No seizures.     CT head 02/04/2024.  Noted discussed with Dr. Michale Age neurosurgery.  No interventions needed.    1. Largely resolved left side but Increased Right Side Subdural Hematoma since 01/31/2024. Right SDH now up to 11 mm with increased leftward midline shift, (now 4-5 mm) and increased mass effect on the right lateral ventricle. 2. Small volume SAH now apparent. Stable small volume IVH. No acute ventriculomegaly. Possible small superimposed right posterior Emma sphere hemorrhagic contusion. 3. No skull fracture identified.   CT 5/9 -  1. Slightly increased size of right larger than left subdural hematomas. Unchanged  mass effect and 3 mm of  leftward midline shift. 2. Small volume subarachnoid hemorrhage with mildly increased hemorrhage in the left lateral ventricle, possibly redistribution.  CT 5/8 -   1. Acute 9 mm thick right cerebral convexity subdural hemorrhage which extends along the right tentorial leaflet. Resulting 3 mm leftward midline shift. 2. Scattered small volume of acute subarachnoid hemorrhage. 3. No evidence of acute fracture or traumatic malalignment in the cervical spine.  TTE - 1. Left ventricular ejection fraction, by estimation, is 60 to 65% . The left ventricle has normal function. The left ventricle has no regional wall motion abnormalities. Left ventricular diastolic parameters are consistent with Grade I diastolic dysfunction ( impaired relaxation) . 2. Right ventricular systolic function is normal. The right ventricular size is normal. 3. The mitral valve is normal in structure. No evidence of mitral valve regurgitation. No evidence of mitral stenosis. 4. The aortic valve was not well visualized. Aortic valve regurgitation is mild. No aortic stenosis is present. 5. The inferior vena cava is normal in size with greater than 50% respiratory variability, suggesting right atrial pressure of 3 mmHg.      Disposition Plan  :    Status is: Inpatient  DVT Prophylaxis  :    Place and maintain sequential compression device Start: 02/05/24 0657 SCDs Start: 01/30/24 2330   Lab Results  Component Value Date   PLT 239 02/07/2024    Diet :  Diet Order             Diet NPO time specified Except for: Other (See Comments)  Diet effective now                    Inpatient Medications  Scheduled Meds:  ALPRAZolam   0.5 mg Oral BID   arformoterol   15 mcg Nebulization BID   budesonide  (PULMICORT ) nebulizer solution  0.25 mg Nebulization BID   Chlorhexidine  Gluconate Cloth  6 each Topical Daily   cloNIDine  0.1 mg Transdermal Q Wed   feeding supplement (PROSource TF20)  60 mL  Per Tube BID   free water  150 mL Per Tube Q4H   furosemide   40 mg Intravenous Once   hydrALAZINE  50 mg Oral Q8H   insulin  aspart  0-9 Units Subcutaneous Q4H   lacosamide  200 mg Per NG tube BID   levothyroxine   150 mcg Oral Q0600   metoprolol tartrate  12.5 mg Per Tube BID   multivitamin with minerals  1 tablet Oral Daily   mouth rinse  15 mL Mouth Rinse 4 times per day   pantoprazole  (PROTONIX ) IV  40 mg Intravenous QHS   phenobarbital  64.8 mg Per Tube BID   revefenacin   175 mcg Nebulization Daily   sodium chloride  flush  10-40 mL Intracatheter Q12H   sodium chloride   1 g Oral BID   Continuous Infusions:  cefTRIAXone (ROCEPHIN)  IV 1 g (02/07/24 1101)   feeding supplement (OSMOLITE 1.5 CAL) 1,000 mL (02/07/24 1102)   PRN Meds:.docusate sodium , haloperidol lactate, hydrALAZINE, hydrALAZINE, labetalol, mouth rinse, polyethylene glycol, sodium chloride  flush, traMADol   Antibiotics  :    Anti-infectives (From admission, onward)    Start     Dose/Rate Route Frequency Ordered Stop   02/04/24 1030  cefTRIAXone (ROCEPHIN) 1 g in sodium chloride  0.9 % 100 mL IVPB        1 g 200 mL/hr over 30 Minutes Intravenous Every 24 hours 02/04/24 0937 02/09/24 1029         Objective:   Vitals:  02/07/24 0800 02/07/24 0806 02/07/24 0840 02/07/24 1030  BP: (!) 152/84 (!) 150/84 (!) 156/81 (!) 152/88  Pulse: (!) 108 (!) 110 (!) 110 (!) 107  Resp: 16 18 15    Temp:      TempSrc:      SpO2: 95% 95% 96%   Weight:      Height:        Wt Readings from Last 3 Encounters:  02/07/24 94.2 kg  09/06/23 94.8 kg  07/11/22 103.4 kg     Intake/Output Summary (Last 24 hours) at 02/07/2024 1310 Last data filed at 02/07/2024 0700 Gross per 24 hour  Intake 1640.13 ml  Output 1300 ml  Net 340.13 ml     Physical Exam  Somnolent, but more awake and interactive today, left periorbital bruising from his fall at home Symmetrical Chest wall movement, Good air movement bilaterally, CTAB RRR,No  Gallops,Rubs or new Murmurs, No Parasternal Heave +ve B.Sounds, Abd Soft, No tenderness, No rebound - guarding or rigidity. No Cyanosis, Clubbing or edema, difficult bruising in bilateral feet from his previous fall.    RN pressure injury documentation: Pressure Injury 01/31/24 Coccyx Mid Deep Tissue Pressure Injury - Purple or maroon localized area of discolored intact skin or blood-filled blister due to damage of underlying soft tissue from pressure and/or shear. (Active)  01/31/24 0100  Location: Coccyx  Location Orientation: Mid  Staging: Deep Tissue Pressure Injury - Purple or maroon localized area of discolored intact skin or blood-filled blister due to damage of underlying soft tissue from pressure and/or shear.  Wound Description (Comments):   Present on Admission: Yes  Dressing Type Foam - Lift dressing to assess site every shift 02/07/24 0801      Data Review:    Recent Labs  Lab 02/03/24 0445 02/04/24 0436 02/05/24 0432 02/06/24 0443 02/07/24 0436  WBC 15.2* 16.5* 14.6* 10.6* 10.3  HGB 10.0* 10.6* 10.5* 9.6* 8.7*  HCT 27.5* 29.7* 29.9* 28.2* 25.5*  PLT 157 162 195 189 239  MCV 87.9 86.8 87.9 91.0 92.4  MCH 31.9 31.0 30.9 31.0 31.5  MCHC 36.4* 35.7 35.1 34.0 34.1  RDW 15.7* 15.3 15.9* 16.3* 16.6*  LYMPHSABS  --  3.2 3.2 2.9 2.6  MONOABS  --  2.5* 2.1* 1.4* 1.4*  EOSABS  --  0.0 0.0 0.0 0.0  BASOSABS  --  0.0 0.0 0.0 0.0    Recent Labs  Lab 02/03/24 0445 02/03/24 0837 02/04/24 0436 02/05/24 0432 02/05/24 0857 02/06/24 0443 02/07/24 0436  NA 127*  --  128* 131*  --  132* 133*  K 3.8  --  3.3* 3.8  --  3.6 3.4*  CL 93*  --  91* 97*  --  100 104  CO2 22  --  26 23  --  21* 22  ANIONGAP 12  --  11 11  --  11 7  GLUCOSE 156*  --  144* 151*  --  125* 167*  BUN 23  --  22 21  --  22 21  CREATININE 0.64  --  0.65 0.65  --  0.68 0.57*  AST  --   --  36 50*  --  31 38  ALT  --   --  32 40  --  34 31  ALKPHOS  --   --  47 53  --  54 53  BILITOT  --   --  0.9  0.7  --  0.6 0.7  ALBUMIN  --   --  3.5 3.4*  --  3.0* 2.7*  CRP  --  1.0* 1.7* 1.9*  --  1.9* 3.0*  PROCALCITON  --  0.38 0.14 0.10  --  0.24 0.14  INR  --   --   --   --  1.0  --   --   TSH 30.797*  --   --   --   --   --   --   BNP 177.1*  --   --   --   --   --   --   MG 2.1  --  2.1 2.0  --  1.9 1.9  PHOS 2.7  --  3.1 3.5  --  3.4 2.5  CALCIUM  8.8*  --  9.2 9.1  --  8.4* 8.1*      Recent Labs  Lab 02/03/24 0445 02/03/24 0837 02/04/24 0436 02/05/24 0432 02/05/24 0857 02/06/24 0443 02/07/24 0436  CRP  --  1.0* 1.7* 1.9*  --  1.9* 3.0*  PROCALCITON  --  0.38 0.14 0.10  --  0.24 0.14  INR  --   --   --   --  1.0  --   --   TSH 30.797*  --   --   --   --   --   --   BNP 177.1*  --   --   --   --   --   --   MG 2.1  --  2.1 2.0  --  1.9 1.9  CALCIUM  8.8*  --  9.2 9.1  --  8.4* 8.1*    --------------------------------------------------------------------------------------------------------------- No results found for: "CHOL", "HDL", "LDLCALC", "LDLDIRECT", "TRIG", "CHOLHDL"  Lab Results  Component Value Date   HGBA1C 6.1 (H) 01/31/2024   No results for input(s): "TSH", "T4TOTAL", "FREET4", "T3FREE", "THYROIDAB" in the last 72 hours.     Micro Results Recent Results (from the past 240 hours)  Resp panel by RT-PCR (RSV, Flu A&B, Covid) Anterior Nasal Swab     Status: None   Collection Time: 01/30/24  8:10 PM   Specimen: Anterior Nasal Swab  Result Value Ref Range Status   SARS Coronavirus 2 by RT PCR NEGATIVE NEGATIVE Final   Influenza A by PCR NEGATIVE NEGATIVE Final   Influenza B by PCR NEGATIVE NEGATIVE Final    Comment: (NOTE) The Xpert Xpress SARS-CoV-2/FLU/RSV plus assay is intended as an aid in the diagnosis of influenza from Nasopharyngeal swab specimens and should not be used as a sole basis for treatment. Nasal washings and aspirates are unacceptable for Xpert Xpress SARS-CoV-2/FLU/RSV testing.  Fact Sheet for  Patients: BloggerCourse.com  Fact Sheet for Healthcare Providers: SeriousBroker.it  This test is not yet approved or cleared by the [place]  FDA and has been authorized for detection and/or diagnosis of SARS-CoV-2 by FDA under an Emergency Use Authorization (EUA). This EUA will remain in effect (meaning this test can be used) for the duration of the COVID-19 declaration under Section 564(b)(1) of the Act, 21 U.S.C. section 360bbb-3(b)(1), unless the authorization is terminated or revoked.     Resp Syncytial Virus by PCR NEGATIVE NEGATIVE Final    Comment: (NOTE) Fact Sheet for Patients: BloggerCourse.com  Fact Sheet for Healthcare Providers: SeriousBroker.it  This test is not yet approved or cleared by the [place]  FDA and has been authorized for detection and/or diagnosis of SARS-CoV-2 by FDA under an Emergency Use Authorization (EUA). This EUA will remain in effect (meaning this test can be used) for the  duration of the COVID-19 declaration under Section 564(b)(1) of the Act, 21 U.S.C. section 360bbb-3(b)(1), unless the authorization is terminated or revoked.  Performed at [facility], [address] [place], [place] [address]   Blood culture (routine x 2)     Status: None   Collection Time: 01/30/24  8:32 PM   Specimen: BLOOD RIGHT FOREARM  Result Value Ref Range Status   Specimen Description BLOOD RIGHT FOREARM  Final   Special Requests   Final    BOTTLES DRAWN AEROBIC AND ANAEROBIC Blood Culture results may not be optimal due to an inadequate volume of blood received in culture bottles   Culture   Final    NO GROWTH 5 DAYS Performed at [facility], [address], [place], [place] [address]    Report Status 02/04/2024 FINAL  Final  Blood culture (routine x 2)     Status: None   Collection Time: 01/30/24  8:40 PM   Specimen: BLOOD  Result  Value Ref Range Status   Specimen Description BLOOD RIGHT ANTECUBITAL  Final   Special Requests   Final    BOTTLES DRAWN AEROBIC AND ANAEROBIC Blood Culture adequate volume   Culture   Final    NO GROWTH 5 DAYS Performed at [facility], [address], [place], [place] [address]    Report Status 02/04/2024 FINAL  Final  MRSA Next Gen by PCR, Nasal     Status: None   Collection Time: 01/31/24  1:03 AM   Specimen: Nasal Mucosa; Nasal Swab  Result Value Ref Range Status   MRSA by PCR Next Gen NOT DETECTED NOT DETECTED Final    Comment: (NOTE) The GeneXpert MRSA Assay (FDA approved for NASAL specimens only), is one component of a comprehensive MRSA colonization surveillance program. It is not intended to diagnose MRSA infection nor to guide or monitor treatment for MRSA infections. Test performance is not FDA approved in patients less than 77 years old. Performed at [facility], [address], [place], [place] [address]   Urine Culture     Status: Abnormal   Collection Time: 02/03/24  8:22 AM   Specimen: Urine, Random  Result Value Ref Range Status   Specimen Description URINE, RANDOM  Final   Special Requests   Final    NONE Reflexed from [id number] Performed at [facility], [address] [place], [place] [address]    Culture >=100,000 COLONIES/mL ESCHERICHIA COLI (A)  Final   Report Status 02/05/2024 FINAL  Final   Organism ID, Bacteria ESCHERICHIA COLI (A)  Final      Susceptibility   Escherichia coli - MIC*    AMPICILLIN <=2 SENSITIVE Sensitive     CEFAZOLIN <=4 SENSITIVE Sensitive     CEFEPIME <=0.12 SENSITIVE Sensitive     CEFTRIAXONE <=0.25 SENSITIVE Sensitive     CIPROFLOXACIN  <=0.25 SENSITIVE Sensitive     GENTAMICIN <=1 SENSITIVE Sensitive     IMIPENEM <=0.25 SENSITIVE Sensitive     NITROFURANTOIN <=16 SENSITIVE Sensitive     TRIMETH /SULFA  <=20 SENSITIVE Sensitive     AMPICILLIN/SULBACTAM <=2 SENSITIVE Sensitive     PIP/TAZO <=4 SENSITIVE  Sensitive ug/mL    * >=100,000 COLONIES/mL ESCHERICHIA COLI    Radiology Report No results found.   .rad Signature  -   Seena Dadds M.D on 02/07/2024 at 1:10 PM   -  To page go to [email or web address]

## 2024-02-07 NOTE — Procedures (Addendum)
 Patient Name: [name]  MRN: [id number]  Epilepsy Attending: Arleene Lack  Referring Physician/Provider: Arleene Lack, MD  Duration: 02/06/2024 1724 to 02/07/2024 4098   Patient history: 63yo M with right sub dural hematoma. EEG to evaluate for seizure   Level of alertness: Awake, asleep   AEDs during EEG study: VPA, Xanax , LCM, Phenobarb   Technical aspects: This EEG study was done with scalp electrodes positioned according to the 10-20 International system of electrode placement. Electrical activity was reviewed with band pass filter of 1-70Hz , sensitivity of 7 uV/mm, display speed of 35mm/sec with a 60Hz  notched filter applied as appropriate. EEG data were recorded continuously and digitally stored.  Video monitoring was available and reviewed as appropriate.   Description: The posterior dominant rhythm consists of 8-9 Hz activity of moderate voltage (25-35 uV) seen predominantly in posterior head regions, symmetric and reactive to eye opening and eye closing. Sleep was characterized by vertex waves, sleep spindles (12 to 14 Hz), maximal frontocentral region. EEG showed intermittent generalized and lateralized right hemisphere 3 to 6 Hz theta-delta slowing. Hyperventilation and photic stimulation were not performed.     EEG was not interpretable after 0610 on 02/07/2024 as patient pulled majority of the electrodes.   ABNORMALITY - Intermittent slow, generalized and lateralized right hemisphere   IMPRESSION: This study is suggestive of cortical dysfunction arising from right hemisphere, likely secondary to underlying structural abnormality. Additionally there is mild diffuse encephalopathy. No seizures or epileptiform discharges were seen throughout the recording.     Cloie Wooden O Denilson Salminen

## 2024-02-07 NOTE — Progress Notes (Signed)
 LTM VIDEO EEG discontinued - no skin breakdown at [facility]

## 2024-02-07 NOTE — Progress Notes (Addendum)
 LTM maint complete - no skin breakdown under: Fp1, Fp2, Cz, Pz, F8

## 2024-02-08 DIAGNOSIS — S065XAA Traumatic subdural hemorrhage with loss of consciousness status unknown, initial encounter: Secondary | ICD-10-CM | POA: Diagnosis not present

## 2024-02-08 LAB — COMPREHENSIVE METABOLIC PANEL WITH GFR
ALT: 28 U/L (ref 0–44)
AST: 30 U/L (ref 15–41)
Albumin: 2.8 g/dL — ABNORMAL LOW (ref 3.5–5.0)
Alkaline Phosphatase: 52 U/L (ref 38–126)
Anion gap: 7 (ref 5–15)
BUN: 24 mg/dL — ABNORMAL HIGH (ref 8–23)
CO2: 24 mmol/L (ref 22–32)
Calcium: 8.3 mg/dL — ABNORMAL LOW (ref 8.9–10.3)
Chloride: 100 mmol/L (ref 98–111)
Creatinine, Ser: 0.62 mg/dL (ref 0.61–1.24)
GFR, Estimated: >60 mL/min (ref >60)
Glucose, Bld: 158 mg/dL — ABNORMAL HIGH (ref 70–99)
Potassium: 3.9 mmol/L (ref 3.5–5.1)
Sodium: 131 mmol/L — ABNORMAL LOW (ref 135–145)
Total Bilirubin: 0.6 mg/dL (ref 0.0–1.2)
Total Protein: 6.2 g/dL — ABNORMAL LOW (ref 6.5–8.1)

## 2024-02-08 LAB — CBC WITH DIFFERENTIAL/PLATELET
Abs Immature Granulocytes: 0.13 10*3/uL — ABNORMAL HIGH (ref 0.00–0.07)
Basophils Absolute: 0.1 10*3/uL (ref 0.0–0.1)
Basophils Relative: 0 %
Eosinophils Absolute: 0.1 10*3/uL (ref 0.0–0.5)
Eosinophils Relative: 1 %
HCT: 24.3 % — ABNORMAL LOW (ref 39.0–52.0)
Hemoglobin: 8.4 g/dL — ABNORMAL LOW (ref 13.0–17.0)
Immature Granulocytes: 1 %
Lymphocytes Relative: 23 %
Lymphs Abs: 2.6 10*3/uL (ref 0.7–4.0)
MCH: 32.1 pg (ref 26.0–34.0)
MCHC: 34.6 g/dL (ref 30.0–36.0)
MCV: 92.7 fL (ref 80.0–100.0)
Monocytes Absolute: 1.9 10*3/uL — ABNORMAL HIGH (ref 0.1–1.0)
Monocytes Relative: 17 %
Neutro Abs: 6.5 10*3/uL (ref 1.7–7.7)
Neutrophils Relative %: 58 %
Platelets: 247 10*3/uL (ref 150–400)
RBC: 2.62 MIL/uL — ABNORMAL LOW (ref 4.22–5.81)
RDW: 16.8 % — ABNORMAL HIGH (ref 11.5–15.5)
WBC: 11.2 10*3/uL — ABNORMAL HIGH (ref 4.0–10.5)
nRBC: 0.6 % — ABNORMAL HIGH (ref 0.0–0.2)

## 2024-02-08 LAB — PHENOBARBITAL LEVEL: Phenobarbital: 31.4 ug/mL (ref 15.0–40.0)

## 2024-02-08 LAB — GLUCOSE, CAPILLARY
Glucose-Capillary: 149 mg/dL — ABNORMAL HIGH (ref 70–99)
Glucose-Capillary: 152 mg/dL — ABNORMAL HIGH (ref 70–99)
Glucose-Capillary: 150 mg/dL — ABNORMAL HIGH (ref 70–99)
Glucose-Capillary: 156 mg/dL — ABNORMAL HIGH (ref 70–99)
Glucose-Capillary: 182 mg/dL — ABNORMAL HIGH (ref 70–99)
Glucose-Capillary: 176 mg/dL — ABNORMAL HIGH (ref 70–99)

## 2024-02-08 LAB — PROCALCITONIN: Procalcitonin: <0.1 ng/mL

## 2024-02-08 MED ORDER — ACETAMINOPHEN 325 MG PO TABS
650.0000 mg | ORAL_TABLET | Freq: Four times a day (QID) | ORAL | Status: DC | PRN
  Administered 2024-02-08 – 2024-02-11 (×3): 650 mg
  Filled 2024-02-08 (×3): qty 2

## 2024-02-08 MED ORDER — LACTULOSE 10 GM/15ML PO SOLN
30.0000 g | Freq: Once | ORAL | Status: AC
  Administered 2024-02-08: 30 g
  Filled 2024-02-08: qty 60

## 2024-02-08 MED ORDER — MAGNESIUM HYDROXIDE 400 MG/5ML PO SUSP
30.0000 mL | Freq: Once | ORAL | Status: AC
  Administered 2024-02-08: 30 mL
  Filled 2024-02-08: qty 30

## 2024-02-08 MED ORDER — HYDRALAZINE HCL 50 MG PO TABS
50.0000 mg | ORAL_TABLET | Freq: Four times a day (QID) | ORAL | Status: DC
  Administered 2024-02-08 – 2024-02-09 (×4): 50 mg
  Filled 2024-02-08 (×4): qty 1

## 2024-02-08 NOTE — Plan of Care (Signed)

## 2024-02-08 NOTE — Plan of Care (Signed)
 PHB levels are therapeutic.  Continue current antiepileptic regimen. Will need that for the foreseeable future. Will need neurology follow up and neurosurgery follow up as outpatient. Inpatient neurology will be available as needed.  Anastasia Balo, MD Neurology

## 2024-02-08 NOTE — Progress Notes (Signed)
 PROGRESS NOTE                                                                                                                                                                                                             Patient Demographics:    [name], is a 63 y.o. male, DOB - 1960/11/06, ZOX:[id number]  Outpatient Primary MD for the patient is Claudell Cruz, MD    LOS - 9  Admit date - 01/30/2024    Chief Complaint  Patient presents with   Fall   Altered Mental Status       Brief Narrative (HPI from H&P)     63 year old man who was brought in by EMS from home after a fall.  Apparently wife called, patient is currently ANO x 1 and history obtained after chart review, unable to reach wife.  His heart rate initially was in the 80s, A/O x 1 per EMS, bradycardic to 40s in the ED , noted to be hypothermic 94.  Bruises of multiple ages left hip left eye and left shoulder Labs significant for no leukocytosis, hemoglobin 9.3, mild thrombocytopenia 1 27K, mild hypokalemia 3.3, hypomagnesemia 1.5, normal LFTs.  UDS positive for benzodiazepines, tox screen negative, ammonia 19, TSH 48.  EKG showed sinus bradycardia.  He was given 1 g of magnesium , 100 mg of IV Solu-Cortef  and 200 mics of levothyroxine  IV. Head CT showed 9 mm right subdural hematoma with 3 mm midline shift and small volume acute SAH, cervical spine was negative.  He was admitted to ICU by pulmonary critical care, seen by neurosurgeon Dr. Michale Age, stabilized and transferred to my care on 02/03/2024 on day 4 of his hospital stay, still little confused with NG tube in.  Significant Hospital Events: Including procedures, antibiotic start and stop dates in addition to other pertinent events   5/8 Admitted 5/9 No overnight issues, patient remained confused and restless, trying to get out of bed 5/10 ongoing issues with agitation overnight 5/11 mentation improved this  a.m. with less impulsivity 02/03/2024 transferred to [facility] 02/04/2024 LTM EEG 02/08/2024 LTM EEG discontinued   Subjective:   No significant events overnight as discussed with staff, he is more awake today, last BM 4 days ago.       Assessment  & Plan :    Acute right subdural hematoma with 3  mm midline shift - Acute traumatic subarachnoid hemorrhage status post fall  -Repeat head CT 5/9 with slightly increased size of right larger than left subdural hematomas with 3 mm leftward midline shift, neurosurgery on board, appreciate their input.  - repeat CT scan on 02/04/2024 done as he was slightly drowsy, mild worsening on the right sided SDH. - Discussed with neurosurgery 5/14, they would like to avoid subjecting him to surgery and a palliative procedure unless he fails maximal medical treatment for seizures   Acute metabolic encephalopathy. - This is multifactorial, in the setting of subdural hematoma, seizures, and multiple AEDs - Neurology input greatly appreciated, will keep close observation given multiple AEDs and encephalopathy - Patient continues to improve on current AED regimen.   -continue with tube feed for now, MBS on Monday.   - Mentation continues to improve  Breakthrough seizures in the setting of subdural hematoma Controlled seizures at baseline -LTM EEG - Management per neurology, did show significant improvement after starting phenobarbital , LTM with no further seizures over last 48 hours, so recommendation is to continue with phenobarbital  65 mg twice daily.  Tube, (to monitor level closely), continue with Vimpat , and Depacon  has been discontinued due to interaction with phenobarbital .   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  Counseled on compliance.   Bradycardia/hypothermia due to hypothyroidism - resolved - With initiation of IV Synthroid  bradycardia has now resolved, continue to monitor on telemetry.  Echo stable.   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   - Culture growing E. coli, treated with Rocephin  5 doses  Hypertension - As n.p.o. initially he was n.p.o. and hydralazine  and clonidine  patch, now has NG tube, remains elevated so continue with hydralazine  scheduled, improved after adding metoprolol .  Anemia - Monitor closely, no indication for transfusion, some dilutional effect.  Overall stable with no evidence of GI bleed  Prediabetes P: Continue SSI CBG goal 140-180 CBG checks every 4  Lab Results  Component Value Date   HGBA1C 6.1 (H) 01/31/2024   CBG (last 3)  Recent Labs    02/08/24 0308 02/08/24 0819 02/08/24 1140  GLUCAP 149* 152* 150*          Condition -  Guarded  Family Communication  :  Discussed with wife at bedside daily  Code Status : Full code  Consults  : PCCM, neurosurgery Dr. Michale Age, neurology, nephrology  PUD Prophylaxis : PPI   Procedures  :     EEG 02/03/2024.  No seizures.     CT head 02/04/2024.  Noted discussed with Dr. Michale Age neurosurgery.  No interventions needed.    1. Largely resolved left side but Increased Right Side Subdural Hematoma since 01/31/2024. Right SDH now up to 11 mm with increased leftward midline shift, (now 4-5 mm) and increased mass effect on the right lateral ventricle. 2. Small volume SAH now apparent. Stable small volume IVH.  No acute ventriculomegaly.  Possible small superimposed right posterior Emma sphere hemorrhagic contusion. 3. No skull fracture identified.   CT 5/9 -  1. Slightly increased size of right larger than left subdural hematomas. Unchanged mass effect and 3 mm of leftward midline shift. 2. Small volume subarachnoid hemorrhage with mildly increased hemorrhage in the left lateral ventricle, possibly redistribution.  CT 5/8 -   1. Acute 9 mm thick right cerebral convexity subdural hemorrhage which extends along the right tentorial leaflet. Resulting 3 mm leftward midline shift. 2. Scattered small volume of acute subarachnoid hemorrhage. 3. No evidence of acute fracture or traumatic malalignment in the cervical spine.  TTE - 1. Left ventricular ejection fraction, by estimation, is 60 to 65% . The left ventricle has normal function. The left ventricle has no regional wall motion abnormalities. Left ventricular diastolic parameters are consistent with Grade I diastolic dysfunction ( impaired relaxation) . 2. Right ventricular systolic function is normal. The right ventricular size is normal. 3. The mitral valve is normal in structure. No evidence of mitral valve regurgitation. No evidence of mitral stenosis. 4. The aortic valve was not well visualized. Aortic valve regurgitation is mild. No aortic stenosis is present. 5. The inferior vena cava is normal in size with greater than 50% respiratory variability, suggesting right atrial pressure of 3 mmHg.      Disposition Plan  :    Status is: Inpatient  DVT Prophylaxis  :    Place and maintain sequential compression device Start: 02/05/24 0657 SCDs Start: 01/30/24 2330   Lab Results  Component Value Date   PLT 247 02/08/2024    Diet :  Diet Order             Diet NPO time specified Except for: Other (See Comments)  Diet effective now                    Inpatient Medications  Scheduled Meds:  ALPRAZolam   0.5 mg Oral BID    arformoterol   15 mcg Nebulization BID   budesonide  (PULMICORT ) nebulizer solution  0.25 mg Nebulization BID   Chlorhexidine  Gluconate Cloth  6 each Topical Daily   cloNIDine   0.1 mg Transdermal Q Wed   feeding supplement (PROSource TF20)  60 mL Per Tube BID   free water   150 mL Per Tube Q4H   hydrALAZINE   50 mg Oral Q8H   insulin  aspart  0-9 Units Subcutaneous Q4H   lacosamide   200 mg Per NG tube BID   levothyroxine   150 mcg Oral Q0600   metoprolol  tartrate  25 mg Per Tube BID   multivitamin with minerals  1 tablet Oral Daily   mouth rinse  15 mL Mouth Rinse 4 times per day   pantoprazole  (PROTONIX ) IV  40 mg Intravenous QHS   phenobarbital   64.8 mg Per Tube BID   revefenacin   175 mcg Nebulization Daily   sodium chloride  flush  10-40 mL Intracatheter Q12H   sodium chloride   1 g Oral BID   Continuous Infusions:  feeding supplement (OSMOLITE 1.5 CAL) 1,000 mL (02/08/24 0935)   PRN Meds:.docusate sodium , haloperidol  lactate, hydrALAZINE , hydrALAZINE , labetalol , mouth rinse, polyethylene glycol, sodium chloride  flush, traMADol   Antibiotics  :    Anti-infectives (From admission, onward)    Start     Dose/Rate Route Frequency Ordered Stop   02/04/24 1030  cefTRIAXone  (ROCEPHIN ) 1 g in sodium chloride  0.9 % 100 mL IVPB        1 g 200 mL/hr over 30 Minutes Intravenous  Every 24 hours 02/04/24 0937 02/08/24 0957         Objective:   Vitals:   02/08/24 0600 02/08/24 0816 02/08/24 0821 02/08/24 1140  BP: 137/72  135/75 135/70  Pulse: 84 80 85 81  Resp: 18 15 16 17   Temp:   98.4 F (36.9 C) 98.4 F (36.9 C)  TempSrc:   Oral Oral  SpO2: 94% 96% 100% 96%  Weight:      Height:        Wt Readings from Last 3 Encounters:  02/08/24 90.8 kg  09/06/23 94.8 kg  07/11/22 103.4 kg     Intake/Output Summary (Last 24 hours) at 02/08/2024 1254 Last data filed at 02/08/2024 1141 Gross per 24 hour  Intake 3730.16 ml  Output 3800 ml  Net -69.84 ml     Physical Exam  Awake this  morning, interactive, remains mildly confused, left periorbital bruising from his fall at home Symmetrical Chest wall movement, Good air movement bilaterally, CTAB RRR,No Gallops,Rubs or new Murmurs, No Parasternal Heave +ve B.Sounds, Abd Soft, No tenderness, No rebound - guarding or rigidity. No Cyanosis, Clubbing or edema, significant bruising in bilateral feet due to   RN pressure injury documentation: Pressure Injury 01/31/24 Coccyx Mid Deep Tissue Pressure Injury - Purple or maroon localized area of discolored intact skin or blood-filled blister due to damage of underlying soft tissue from pressure and/or shear. (Active)  01/31/24 0100  Location: Coccyx  Location Orientation: Mid  Staging: Deep Tissue Pressure Injury - Purple or maroon localized area of discolored intact skin or blood-filled blister due to damage of underlying soft tissue from pressure and/or shear.  Wound Description (Comments):   Present on Admission: Yes  Dressing Type Foam - Lift dressing to assess site every shift 02/08/24 0830      Data Review:    Recent Labs  Lab 02/04/24 0436 02/05/24 0432 02/06/24 0443 02/07/24 0436 02/08/24 0258  WBC 16.5* 14.6* 10.6* 10.3 11.2*  HGB 10.6* 10.5* 9.6* 8.7* 8.4*  HCT 29.7* 29.9* 28.2* 25.5* 24.3*  PLT 162 195 189 239 247  MCV 86.8 87.9 91.0 92.4 92.7  MCH 31.0 30.9 31.0 31.5 32.1  MCHC 35.7 35.1 34.0 34.1 34.6  RDW 15.3 15.9* 16.3* 16.6* 16.8*  LYMPHSABS 3.2 3.2 2.9 2.6 2.6  MONOABS 2.5* 2.1* 1.4* 1.4* 1.9*  EOSABS 0.0 0.0 0.0 0.0 0.1  BASOSABS 0.0 0.0 0.0 0.0 0.1    Recent Labs  Lab 02/03/24 0445 02/03/24 0837 02/03/24 0837 02/04/24 0436 02/05/24 0432 02/05/24 0857 02/06/24 0443 02/07/24 0436 02/08/24 0258  NA 127*  --   --  128* 131*  --  132* 133* 131*  K 3.8  --   --  3.3* 3.8  --  3.6 3.4* 3.9  CL 93*  --   --  91* 97*  --  100 104 100  CO2 22  --   --  26 23  --  21* 22 24  ANIONGAP 12  --   --  11 11  --  11 7 7   GLUCOSE 156*  --   --   144* 151*  --  125* 167* 158*  BUN 23  --   --  22 21  --  22 21 24*  CREATININE 0.64  --   --  0.65 0.65  --  0.68 0.57* 0.62  AST  --   --   --  36 50*  --  31 38 30  ALT  --   --   --  32 40  --  34 31 28  ALKPHOS  --   --   --  47 53  --  54 53 52  BILITOT  --   --   --  0.9 0.7  --  0.6 0.7 0.6  ALBUMIN  --   --   --  3.5 3.4*  --  3.0* 2.7* 2.8*  CRP  --  1.0*  --  1.7* 1.9*  --  1.9* 3.0*  --   PROCALCITON  --  0.38   < > 0.14 0.10  --  0.24 0.14 <0.10  INR  --   --   --   --   --  1.0  --   --   --   TSH 30.797*  --   --   --   --   --   --   --   --   BNP 177.1*  --   --   --   --   --   --   --   --   MG 2.1  --   --  2.1 2.0  --  1.9 1.9  --   PHOS 2.7  --   --  3.1 3.5  --  3.4 2.5  --   CALCIUM  8.8*  --   --  9.2 9.1  --  8.4* 8.1* 8.3*   < > = values in this interval not displayed.      Recent Labs  Lab 02/03/24 0445 02/03/24 0837 02/03/24 0837 02/04/24 0436 02/05/24 0432 02/05/24 0857 02/06/24 0443 02/07/24 0436 02/08/24 0258  CRP  --  1.0*  --  1.7* 1.9*  --  1.9* 3.0*  --   PROCALCITON  --  0.38   < > 0.14 0.10  --  0.24 0.14 <0.10  INR  --   --   --   --   --  1.0  --   --   --   TSH 30.797*  --   --   --   --   --   --   --   --   BNP 177.1*  --   --   --   --   --   --   --   --   MG 2.1  --   --  2.1 2.0  --  1.9 1.9  --   CALCIUM  8.8*  --   --  9.2 9.1  --  8.4* 8.1* 8.3*   < > = values in this interval not displayed.    --------------------------------------------------------------------------------------------------------------- No results found for: "CHOL", "HDL", "LDLCALC", "LDLDIRECT", "TRIG", "CHOLHDL"  Lab Results  Component Value Date   HGBA1C 6.1 (H) 01/31/2024   No results for input(s): "TSH", "T4TOTAL", "FREET4", "T3FREE", "THYROIDAB" in the last 72 hours.     Micro Results Recent Results (from the past 240 hours)  Resp panel by RT-PCR (RSV, Flu A&B, Covid) Anterior Nasal Swab     Status: None   Collection Time: 01/30/24  8:10 PM    Specimen: Anterior Nasal Swab  Result Value Ref Range Status   SARS Coronavirus 2 by RT PCR NEGATIVE NEGATIVE Final   Influenza A by PCR NEGATIVE NEGATIVE Final   Influenza B by PCR NEGATIVE NEGATIVE Final    Comment: (NOTE) The Xpert Xpress SARS-CoV-2/FLU/RSV plus assay is intended as an aid in the diagnosis of influenza from Nasopharyngeal swab specimens and should not be used as a sole basis for  treatment. Nasal washings and aspirates are unacceptable for Xpert Xpress SARS-CoV-2/FLU/RSV testing.  Fact Sheet for Patients: BloggerCourse.com  Fact Sheet for Healthcare Providers: SeriousBroker.it  This test is not yet approved or cleared by the [place]  FDA and has been authorized for detection and/or diagnosis of SARS-CoV-2 by FDA under an Emergency Use Authorization (EUA). This EUA will remain in effect (meaning this test can be used) for the duration of the COVID-19 declaration under Section 564(b)(1) of the Act, 21 U.S.C. section 360bbb-3(b)(1), unless the authorization is terminated or revoked.     Resp Syncytial Virus by PCR NEGATIVE NEGATIVE Final    Comment: (NOTE) Fact Sheet for Patients: BloggerCourse.com  Fact Sheet for Healthcare Providers: SeriousBroker.it  This test is not yet approved or cleared by the [place]  FDA and has been authorized for detection and/or diagnosis of SARS-CoV-2 by FDA under an Emergency Use Authorization (EUA). This EUA will remain in effect (meaning this test can be used) for the duration of the COVID-19 declaration under Section 564(b)(1) of the Act, 21 U.S.C. section 360bbb-3(b)(1), unless the authorization is terminated or revoked.  Performed at [facility], [address], [place], [place] [address]   Blood culture (routine x 2)     Status: None   Collection Time: 01/30/24  8:32 PM   Specimen: BLOOD RIGHT FOREARM   Result Value Ref Range Status   Specimen Description BLOOD RIGHT FOREARM  Final   Special Requests   Final    BOTTLES DRAWN AEROBIC AND ANAEROBIC Blood Culture results may not be optimal due to an inadequate volume of blood received in culture bottles   Culture   Final    NO GROWTH 5 DAYS Performed at [facility], [address] [place], [place] [address]    Report Status 02/04/2024 FINAL  Final  Blood culture (routine x 2)     Status: None   Collection Time: 01/30/24  8:40 PM   Specimen: BLOOD  Result Value Ref Range Status   Specimen Description BLOOD RIGHT ANTECUBITAL  Final   Special Requests   Final    BOTTLES DRAWN AEROBIC AND ANAEROBIC Blood Culture adequate volume   Culture   Final    NO GROWTH 5 DAYS Performed at [facility], [address], [place], [place] [address]    Report Status 02/04/2024 FINAL  Final  MRSA Next Gen by PCR, Nasal     Status: None   Collection Time: 01/31/24  1:03 AM   Specimen: Nasal Mucosa; Nasal Swab  Result Value Ref Range Status   MRSA by PCR Next Gen NOT DETECTED NOT DETECTED Final    Comment: (NOTE) The GeneXpert MRSA Assay (FDA approved for NASAL specimens only), is one component of a comprehensive MRSA colonization surveillance program. It is not intended to diagnose MRSA infection nor to guide or monitor treatment for MRSA infections. Test performance is not FDA approved in patients less than 79 years old. Performed at [facility], [address] [place], [place] [address]   Urine Culture     Status: Abnormal   Collection Time: 02/03/24  8:22 AM   Specimen: Urine, Random  Result Value Ref Range Status   Specimen Description URINE, RANDOM  Final   Special Requests   Final    NONE Reflexed from [id number] Performed at [facility], [address], [place], [place] [address]    Culture >=100,000 COLONIES/mL ESCHERICHIA COLI (A)  Final   Report  Status 02/05/2024 FINAL  Final   Organism ID, Bacteria  ESCHERICHIA COLI (A)  Final      Susceptibility   Escherichia coli - MIC*    AMPICILLIN <=2 SENSITIVE Sensitive     CEFAZOLIN <=4 SENSITIVE Sensitive     CEFEPIME <=0.12 SENSITIVE Sensitive     CEFTRIAXONE  <=0.25 SENSITIVE Sensitive     CIPROFLOXACIN  <=0.25 SENSITIVE Sensitive     GENTAMICIN <=1 SENSITIVE Sensitive     IMIPENEM <=0.25 SENSITIVE Sensitive     NITROFURANTOIN <=16 SENSITIVE Sensitive     TRIMETH /SULFA  <=20 SENSITIVE Sensitive     AMPICILLIN/SULBACTAM <=2 SENSITIVE Sensitive     PIP/TAZO <=4 SENSITIVE Sensitive ug/mL    * >=100,000 COLONIES/mL ESCHERICHIA COLI    Radiology Report No results found.   .rad Signature  -   Seena Dadds M.D on 02/08/2024 at 12:54 PM   -  To page go to [email or web address]

## 2024-02-09 ENCOUNTER — Inpatient Hospital Stay (HOSPITAL_COMMUNITY): Payer: Medicare (Managed Care)

## 2024-02-09 DIAGNOSIS — R509 Fever, unspecified: Secondary | ICD-10-CM

## 2024-02-09 DIAGNOSIS — S065XAA Traumatic subdural hemorrhage with loss of consciousness status unknown, initial encounter: Secondary | ICD-10-CM | POA: Diagnosis not present

## 2024-02-09 LAB — GLUCOSE, CAPILLARY
Glucose-Capillary: 155 mg/dL — ABNORMAL HIGH (ref 70–99)
Glucose-Capillary: 150 mg/dL — ABNORMAL HIGH (ref 70–99)
Glucose-Capillary: 134 mg/dL — ABNORMAL HIGH (ref 70–99)
Glucose-Capillary: 135 mg/dL — ABNORMAL HIGH (ref 70–99)

## 2024-02-09 LAB — CBC
HCT: 23.3 % — ABNORMAL LOW (ref 39.0–52.0)
Hemoglobin: 7.9 g/dL — ABNORMAL LOW (ref 13.0–17.0)
MCH: 31.9 pg (ref 26.0–34.0)
MCHC: 33.9 g/dL (ref 30.0–36.0)
MCV: 94 fL (ref 80.0–100.0)
Platelets: 268 10*3/uL (ref 150–400)
RBC: 2.48 MIL/uL — ABNORMAL LOW (ref 4.22–5.81)
RDW: 16.9 % — ABNORMAL HIGH (ref 11.5–15.5)
WBC: 11.1 10*3/uL — ABNORMAL HIGH (ref 4.0–10.5)
nRBC: 0.8 % — ABNORMAL HIGH (ref 0.0–0.2)

## 2024-02-09 LAB — BASIC METABOLIC PANEL WITH GFR
Anion gap: 5 (ref 5–15)
BUN: 21 mg/dL (ref 8–23)
CO2: 24 mmol/L (ref 22–32)
Calcium: 8.4 mg/dL — ABNORMAL LOW (ref 8.9–10.3)
Chloride: 99 mmol/L (ref 98–111)
Creatinine, Ser: 0.52 mg/dL — ABNORMAL LOW (ref 0.61–1.24)
GFR, Estimated: >60 mL/min (ref >60)
Glucose, Bld: 152 mg/dL — ABNORMAL HIGH (ref 70–99)
Potassium: 3.7 mmol/L (ref 3.5–5.1)
Sodium: 128 mmol/L — ABNORMAL LOW (ref 135–145)

## 2024-02-09 LAB — MAGNESIUM: Magnesium: 1.9 mg/dL (ref 1.7–2.4)

## 2024-02-09 LAB — PROCALCITONIN: Procalcitonin: <0.1 ng/mL

## 2024-02-09 LAB — BRAIN NATRIURETIC PEPTIDE: B Natriuretic Peptide: 20.9 pg/mL (ref 0.0–100.0)

## 2024-02-09 MED ORDER — SODIUM CHLORIDE 0.9 % IV BOLUS
500.0000 mL | Freq: Once | INTRAVENOUS | Status: AC
  Administered 2024-02-09: 500 mL via INTRAVENOUS

## 2024-02-09 MED ORDER — SODIUM CHLORIDE 0.9 % IV SOLN: INTRAVENOUS | Status: DC

## 2024-02-09 NOTE — Progress Notes (Signed)
 PROGRESS NOTE                                                                                                                                                                                                             Patient Demographics:    [name], is a 63 y.o. male, DOB - 1961-03-27, NWG:[id number]  Outpatient Primary MD for the patient is Claudell Cruz, MD    LOS - 10  Admit date - 01/30/2024    Chief Complaint  Patient presents with   Fall   Altered Mental Status       Brief Narrative (HPI from H&P)     63 year old man who was brought in by EMS from home after a fall.  Apparently wife called, patient is currently ANO x 1 and history obtained after chart review, unable to reach wife.  His heart rate initially was in the 80s, A/O x 1 per EMS, bradycardic to 40s in the ED , noted to be hypothermic 94.  Bruises of multiple ages left hip left eye and left shoulder Labs significant for no leukocytosis, hemoglobin 9.3, mild thrombocytopenia 1 27K, mild hypokalemia 3.3, hypomagnesemia 1.5, normal LFTs.  UDS positive for benzodiazepines, tox screen negative, ammonia 19, TSH 48.  EKG showed sinus bradycardia.  He was given 1 g of magnesium , 100 mg of IV Solu-Cortef  and 200 mics of levothyroxine  IV. Head CT showed 9 mm right subdural hematoma with 3 mm midline shift and small volume acute SAH, cervical spine was negative.  He was admitted to ICU by pulmonary critical care, seen by neurosurgeon Dr. Michale Age, stabilized and transferred to my care on 02/03/2024 on day 4 of his hospital stay, still little confused with NG tube in.  Significant Hospital Events: Including procedures, antibiotic start and stop dates in addition to other pertinent events   5/8 Admitted 5/9 No overnight issues, patient remained confused and restless, trying to get out of bed 5/10 ongoing issues with agitation overnight 5/11 mentation improved this  a.m. with less impulsivity 02/03/2024 transferred to [facility] 02/04/2024 LTM EEG 02/08/2024 LTM EEG discontinued   Subjective:   With fever 101.1 overnight, he had BM yesterday with laxatives, he reports some headache in the afternoon upon reevaluating early afternoon   Assessment  & Plan :    Acute right subdural hematoma with 3 mm  midline shift - Acute traumatic subarachnoid hemorrhage status post fall  -Repeat head CT 5/9 with slightly increased size of right larger than left subdural hematomas with 3 mm leftward midline shift, neurosurgery on board, appreciate their input.  - repeat CT scan on 02/04/2024 done as he was slightly drowsy, mild worsening on the right sided SDH. -SCD for DVT prophylaxis - Discussed with neurosurgery 5/14, they would like to avoid subjecting him to surgery and a palliative procedure unless he fails maximal medical treatment for seizures   Acute metabolic encephalopathy. - This is multifactorial, in the setting of subdural hematoma, seizures, and multiple AEDs - Neurology input greatly appreciated, will keep close observation given multiple AEDs and encephalopathy - Patient continues to improve on current AED regimen.   -continue with tube feed for now, MBS on Monday.   - Mentation continues to improve, is more awake and communicative this morning  Breakthrough seizures in the setting of subdural hematoma Controlled seizures at baseline -LTM EEG - Management per neurology, did show significant improvement after starting phenobarbital , LTM with no further seizures over last 48 hours, so recommendation is to continue with phenobarbital  65 mg twice daily.  Tube, (to monitor level closely), continue with Vimpat , and Depacon  has been discontinued due to interaction with phenobarbital .   - Management per neurology, continue current antiepileptic regimen, PHB level are therapeutic  Hyponatremia -will DC free water  via NG tube and give IV  NS  Fever Hypotension -Patient with fever 101.1 overnight, will check UA, blood cultures, chest x-ray, will give fluid bolus and hold antihypertensive medications, check procalcitonin as well.   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  Counseled on compliance.   Bradycardia/hypothermia due to hypothyroidism - resolved - With initiation of IV Synthroid  bradycardia has now resolved, continue to monitor on telemetry.  Echo stable.   Anxiety disorder on chronic benzodiazepine -It appears patient is on high-dose benzodiazepines upwards of 4 mg of Xanax  nightly Continue reduced dosed home Xanax  1 mg twice daily Monitor for signs of withdrawal   COPD, not in exacerbation Tobacco dependence P: Aspiration precautions Continue home Brovana  and Yupelri  Smoking cessation education when appropriate     UTI.   - Culture growing E. coli, treated with Rocephin  5 doses  Hypertension - Pressure was elevated required multiple medications to be added, but blood pressure is low today so all has been discontinued.    Anemia - Monitor closely, no indication for transfusion, some dilutional effect.  Overall stable with no evidence of GI bleed  Prediabetes P: Continue SSI CBG goal 140-180 CBG checks every 4  Lab Results  Component Value Date   HGBA1C 6.1 (H) 01/31/2024   CBG (last 3)  Recent Labs    02/09/24 0417 02/09/24 0723 02/09/24 1249  GLUCAP 155* 150* 134*          Condition -  Guarded  Family Communication  :  Discussed with wife at bedside daily  Code Status : Full code  Consults  : PCCM, neurosurgery Dr. Michale Age, neurology, nephrology  PUD Prophylaxis : PPI   Procedures  :     EEG 02/03/2024.  No seizures.     CT head 02/04/2024.  Noted discussed with Dr. Michale Age neurosurgery.  No interventions needed.     1. Largely resolved left side but Increased Right Side Subdural Hematoma since 01/31/2024.  Right SDH now up to 11 mm with increased leftward midline shift, (now 4-5 mm) and increased mass effect on the right lateral ventricle. 2. Small volume SAH now apparent. Stable small volume IVH. No acute ventriculomegaly. Possible small superimposed right posterior Emma sphere hemorrhagic contusion. 3. No skull fracture identified.   CT 5/9 -  1. Slightly increased size of right larger than left subdural hematomas. Unchanged mass effect and 3 mm of leftward midline shift. 2. Small volume subarachnoid hemorrhage with mildly increased hemorrhage in the left lateral ventricle, possibly redistribution.  CT 5/8 -   1. Acute 9 mm thick right cerebral convexity subdural hemorrhage which extends along the right tentorial leaflet. Resulting 3 mm leftward midline shift. 2. Scattered small volume of acute subarachnoid hemorrhage. 3. No evidence of acute fracture or traumatic malalignment in the cervical spine.  TTE - 1. Left ventricular ejection fraction, by estimation, is 60 to 65% . The left ventricle has normal function. The left ventricle has no regional wall motion abnormalities. Left ventricular diastolic parameters are consistent with Grade I diastolic dysfunction ( impaired relaxation) . 2. Right ventricular systolic function is normal. The right ventricular size is normal. 3. The mitral valve is normal in structure. No evidence of mitral valve regurgitation. No evidence of mitral stenosis. 4. The aortic valve was not well visualized. Aortic valve regurgitation is mild. No aortic stenosis is present. 5. The inferior vena cava is normal in size with greater than 50% respiratory variability, suggesting right atrial pressure of 3 mmHg.      Disposition Plan  :    Status is: Inpatient  DVT Prophylaxis  :    Place and maintain sequential compression device Start: 02/05/24 0657 SCDs Start: 01/30/24  2330   Lab Results  Component Value Date   PLT 268 02/09/2024    Diet :  Diet Order             Diet NPO time specified Except for: Other (See Comments)  Diet effective now                    Inpatient Medications  Scheduled Meds:  ALPRAZolam   0.5 mg Oral BID   arformoterol   15 mcg Nebulization BID   budesonide  (PULMICORT ) nebulizer solution  0.25 mg Nebulization BID   Chlorhexidine  Gluconate Cloth  6 each Topical Daily   feeding supplement (PROSource TF20)  60 mL Per Tube BID   insulin  aspart  0-9 Units Subcutaneous Q4H   lacosamide   200 mg Per NG tube BID   levothyroxine   150 mcg Oral Q0600   multivitamin with minerals  1 tablet Oral Daily   mouth rinse  15 mL Mouth Rinse 4 times per day   pantoprazole  (PROTONIX ) IV  40 mg Intravenous QHS   phenobarbital   64.8 mg Per Tube BID   revefenacin   175 mcg Nebulization Daily   sodium chloride  flush  10-40 mL Intracatheter Q12H   sodium chloride   1 g Oral BID   Continuous Infusions:  feeding supplement (OSMOLITE 1.5 CAL) 1,000 mL (02/09/24 0238)   sodium chloride      PRN Meds:.acetaminophen , docusate sodium , haloperidol  lactate, hydrALAZINE , hydrALAZINE , labetalol , polyethylene glycol, sodium chloride  flush, traMADol   Antibiotics  :    Anti-infectives (From admission, onward)    Start     Dose/Rate Route Frequency Ordered Stop   02/04/24 1030  cefTRIAXone  (ROCEPHIN ) 1 g in sodium chloride  0.9 % 100 mL IVPB        1 g 200  mL/hr over 30 Minutes Intravenous Every 24 hours 02/04/24 0937 02/08/24 1257         Objective:   Vitals:   02/09/24 0840 02/09/24 0900 02/09/24 1000 02/09/24 1100  BP:  (!) 110/39 (!) 99/47 (!) 104/47  Pulse: 82 82 83 79  Resp: 20 20 20 18   Temp:      TempSrc:      SpO2: 96% 98% 95% 96%  Weight:      Height:        Wt Readings from Last 3 Encounters:  02/09/24 91.5 kg  09/06/23 94.8 kg  07/11/22 103.4 kg     Intake/Output Summary (Last 24 hours) at 02/09/2024 1356 Last data  filed at 02/09/2024 0809 Gross per 24 hour  Intake 1914.75 ml  Output 550 ml  Net 1364.75 ml     Physical Exam  Awake and alert this morning, more interactive, lateral orbital bruising with no significant change Symmetrical Chest wall movement, Good air movement bilaterally, CTAB RRR,No Gallops,Rubs or new Murmurs, No Parasternal Heave +ve B.Sounds, Abd Soft, No tenderness, No rebound - guarding or rigidity. No Cyanosis, Clubbing or edema, significant bruising in bilateral feet due to   RN pressure injury documentation: Pressure Injury 01/31/24 Coccyx Mid Deep Tissue Pressure Injury - Purple or maroon localized area of discolored intact skin or blood-filled blister due to damage of underlying soft tissue from pressure and/or shear. (Active)  01/31/24 0100  Location: Coccyx  Location Orientation: Mid  Staging: Deep Tissue Pressure Injury - Purple or maroon localized area of discolored intact skin or blood-filled blister due to damage of underlying soft tissue from pressure and/or shear.  Wound Description (Comments):   Present on Admission: Yes  Dressing Type Foam - Lift dressing to assess site every shift 02/09/24 0800      Data Review:    Recent Labs  Lab 02/04/24 0436 02/05/24 0432 02/06/24 0443 02/07/24 0436 02/08/24 0258 02/09/24 1227  WBC 16.5* 14.6* 10.6* 10.3 11.2* 11.1*  HGB 10.6* 10.5* 9.6* 8.7* 8.4* 7.9*  HCT 29.7* 29.9* 28.2* 25.5* 24.3* 23.3*  PLT 162 195 189 239 247 268  MCV 86.8 87.9 91.0 92.4 92.7 94.0  MCH 31.0 30.9 31.0 31.5 32.1 31.9  MCHC 35.7 35.1 34.0 34.1 34.6 33.9  RDW 15.3 15.9* 16.3* 16.6* 16.8* 16.9*  LYMPHSABS 3.2 3.2 2.9 2.6 2.6  --   MONOABS 2.5* 2.1* 1.4* 1.4* 1.9*  --   EOSABS 0.0 0.0 0.0 0.0 0.1  --   BASOSABS 0.0 0.0 0.0 0.0 0.1  --     Recent Labs  Lab 02/03/24 0445 02/03/24 0837 02/03/24 0837 02/04/24 0436 02/05/24 0432 02/05/24 0857 02/06/24 0443 02/07/24 0436 02/08/24 0258 02/09/24 1227  NA 127*  --   --  128* 131*   --  132* 133* 131* 128*  K 3.8  --   --  3.3* 3.8  --  3.6 3.4* 3.9 3.7  CL 93*  --   --  91* 97*  --  100 104 100 99  CO2 22  --   --  26 23  --  21* 22 24 24   ANIONGAP 12  --   --  11 11  --  11 7 7 5   GLUCOSE 156*  --   --  144* 151*  --  125* 167* 158* 152*  BUN 23  --   --  22 21  --  22 21 24* 21  CREATININE 0.64  --   --  0.65 0.65  --  0.68 0.57* 0.62 0.52*  AST  --   --   --  36 50*  --  31 38 30  --   ALT  --   --   --  32 40  --  34 31 28  --   ALKPHOS  --   --   --  47 53  --  54 53 52  --   BILITOT  --   --   --  0.9 0.7  --  0.6 0.7 0.6  --   ALBUMIN  --   --   --  3.5 3.4*  --  3.0* 2.7* 2.8*  --   CRP  --  1.0*  --  1.7* 1.9*  --  1.9* 3.0*  --   --   PROCALCITON  --  0.38   < > 0.14 0.10  --  0.24 0.14 <0.10  --   INR  --   --   --   --   --  1.0  --   --   --   --   TSH 30.797*  --   --   --   --   --   --   --   --   --   BNP 177.1*  --   --   --   --   --   --   --   --   --   MG 2.1  --   --  2.1 2.0  --  1.9 1.9  --  1.9  PHOS 2.7  --   --  3.1 3.5  --  3.4 2.5  --   --   CALCIUM  8.8*  --   --  9.2 9.1  --  8.4* 8.1* 8.3* 8.4*   < > = values in this interval not displayed.      Recent Labs  Lab 02/03/24 0445 02/03/24 1610 02/03/24 0837 02/04/24 0436 02/05/24 0432 02/05/24 0857 02/06/24 0443 02/07/24 0436 02/08/24 0258 02/09/24 1227  CRP  --  1.0*  --  1.7* 1.9*  --  1.9* 3.0*  --   --   PROCALCITON  --  0.38   < > 0.14 0.10  --  0.24 0.14 <0.10  --   INR  --   --   --   --   --  1.0  --   --   --   --   TSH 30.797*  --   --   --   --   --   --   --   --   --   BNP 177.1*  --   --   --   --   --   --   --   --   --   MG 2.1  --   --  2.1 2.0  --  1.9 1.9  --  1.9  CALCIUM  8.8*  --   --  9.2 9.1  --  8.4* 8.1* 8.3* 8.4*   < > = values in this interval not displayed.    --------------------------------------------------------------------------------------------------------------- No results found for: "CHOL", "HDL", "LDLCALC", "LDLDIRECT", "TRIG",  "CHOLHDL"  Lab Results  Component Value Date   HGBA1C 6.1 (H) 01/31/2024   No results for input(s): "TSH", "T4TOTAL", "FREET4", "T3FREE", "THYROIDAB" in the last 72 hours.     Micro Results Recent Results (from the past 240 hours)  Resp panel by RT-PCR (RSV, Flu A&B, Covid)  Anterior Nasal Swab     Status: None   Collection Time: 01/30/24  8:10 PM   Specimen: Anterior Nasal Swab  Result Value Ref Range Status   SARS Coronavirus 2 by RT PCR NEGATIVE NEGATIVE Final   Influenza A by PCR NEGATIVE NEGATIVE Final   Influenza B by PCR NEGATIVE NEGATIVE Final    Comment: (NOTE) The Xpert Xpress SARS-CoV-2/FLU/RSV plus assay is intended as an aid in the diagnosis of influenza from Nasopharyngeal swab specimens and should not be used as a sole basis for treatment. Nasal washings and aspirates are unacceptable for Xpert Xpress SARS-CoV-2/FLU/RSV testing.  Fact Sheet for Patients: BloggerCourse.com  Fact Sheet for Healthcare Providers: SeriousBroker.it  This test is not yet approved or cleared by the [place]  FDA and has been authorized for detection and/or diagnosis of SARS-CoV-2 by FDA under an Emergency Use Authorization (EUA). This EUA will remain in effect (meaning this test can be used) for the duration of the COVID-19 declaration under Section 564(b)(1) of the Act, 21 U.S.C. section 360bbb-3(b)(1), unless the authorization is terminated or revoked.     Resp Syncytial Virus by PCR NEGATIVE NEGATIVE Final    Comment: (NOTE) Fact Sheet for Patients: BloggerCourse.com  Fact Sheet for Healthcare Providers: SeriousBroker.it  This test is not yet approved or cleared by the [place]  FDA and has been authorized for detection and/or diagnosis of SARS-CoV-2 by FDA under an Emergency Use Authorization (EUA). This EUA will remain in effect (meaning this test can be used) for  the duration of the COVID-19 declaration under Section 564(b)(1) of the Act, 21 U.S.C. section 360bbb-3(b)(1), unless the authorization is terminated or revoked.  Performed at [facility], [address], [place], [place] [address]   Blood culture (routine x 2)     Status: None   Collection Time: 01/30/24  8:32 PM   Specimen: BLOOD RIGHT FOREARM  Result Value Ref Range Status   Specimen Description BLOOD RIGHT FOREARM  Final   Special Requests   Final    BOTTLES DRAWN AEROBIC AND ANAEROBIC Blood Culture results may not be optimal due to an inadequate volume of blood received in culture bottles   Culture   Final    NO GROWTH 5 DAYS Performed at [facility] [place] [facility], [address] [place], [place] [address]    Report Status 02/04/2024 FINAL  Final  Blood culture (routine x 2)     Status: None   Collection Time: 01/30/24  8:40 PM   Specimen: BLOOD  Result Value Ref Range Status   Specimen Description BLOOD RIGHT ANTECUBITAL  Final   Special Requests   Final    BOTTLES DRAWN AEROBIC AND ANAEROBIC Blood Culture adequate volume   Culture   Final    NO GROWTH 5 DAYS Performed at [facility], [address], [place], [place] [address]    Report Status 02/04/2024 FINAL  Final  MRSA Next Gen by PCR, Nasal     Status: None   Collection Time: 01/31/24  1:03 AM   Specimen: Nasal Mucosa; Nasal Swab  Result Value Ref Range Status   MRSA by PCR Next Gen NOT DETECTED NOT DETECTED Final    Comment: (NOTE) The GeneXpert MRSA Assay (FDA approved for NASAL specimens only), is one component of a comprehensive MRSA colonization surveillance program. It is not intended to diagnose MRSA infection nor to guide or monitor treatment for MRSA infections. Test performance is not FDA approved in patients less than 2  years old. Performed at [facility], [address] [place], [place] [address]   Urine Culture     Status: Abnormal   Collection Time: 02/03/24  8:22 AM    Specimen: Urine, Random  Result Value Ref Range Status   Specimen Description URINE, RANDOM  Final   Special Requests   Final    NONE Reflexed from [id number] Performed at [facility], [address] [place], [place] [address]    Culture >=100,000 COLONIES/mL ESCHERICHIA COLI (A)  Final   Report Status 02/05/2024 FINAL  Final   Organism ID, Bacteria ESCHERICHIA COLI (A)  Final      Susceptibility   Escherichia coli - MIC*    AMPICILLIN <=2 SENSITIVE Sensitive     CEFAZOLIN <=4 SENSITIVE Sensitive     CEFEPIME <=0.12 SENSITIVE Sensitive     CEFTRIAXONE  <=0.25 SENSITIVE Sensitive     CIPROFLOXACIN  <=0.25 SENSITIVE Sensitive     GENTAMICIN <=1 SENSITIVE Sensitive     IMIPENEM <=0.25 SENSITIVE Sensitive     NITROFURANTOIN <=16 SENSITIVE Sensitive     TRIMETH /SULFA  <=20 SENSITIVE Sensitive     AMPICILLIN/SULBACTAM <=2 SENSITIVE Sensitive     PIP/TAZO <=4 SENSITIVE Sensitive ug/mL    * >=100,000 COLONIES/mL ESCHERICHIA COLI    Radiology Report No results found.   .rad Signature  -   Seena Dadds M.D on 02/09/2024 at 1:56 PM   -  To page go to [email or web address]

## 2024-02-10 ENCOUNTER — Inpatient Hospital Stay (HOSPITAL_COMMUNITY): Payer: Medicare (Managed Care)

## 2024-02-10 DIAGNOSIS — S065XAA Traumatic subdural hemorrhage with loss of consciousness status unknown, initial encounter: Secondary | ICD-10-CM | POA: Diagnosis not present

## 2024-02-10 LAB — GLUCOSE, CAPILLARY
Glucose-Capillary: 141 mg/dL — ABNORMAL HIGH (ref 70–99)
Glucose-Capillary: 149 mg/dL — ABNORMAL HIGH (ref 70–99)
Glucose-Capillary: 156 mg/dL — ABNORMAL HIGH (ref 70–99)
Glucose-Capillary: 158 mg/dL — ABNORMAL HIGH (ref 70–99)
Glucose-Capillary: 167 mg/dL — ABNORMAL HIGH (ref 70–99)
Glucose-Capillary: 168 mg/dL — ABNORMAL HIGH (ref 70–99)
Glucose-Capillary: 170 mg/dL — ABNORMAL HIGH (ref 70–99)
Glucose-Capillary: 192 mg/dL — ABNORMAL HIGH (ref 70–99)

## 2024-02-10 LAB — BASIC METABOLIC PANEL WITH GFR
Anion gap: 9 (ref 5–15)
BUN: 20 mg/dL (ref 8–23)
CO2: 23 mmol/L (ref 22–32)
Calcium: 8.7 mg/dL — ABNORMAL LOW (ref 8.9–10.3)
Chloride: 99 mmol/L (ref 98–111)
Creatinine, Ser: 0.51 mg/dL — ABNORMAL LOW (ref 0.61–1.24)
GFR, Estimated: >60 mL/min (ref >60)
Glucose, Bld: 146 mg/dL — ABNORMAL HIGH (ref 70–99)
Potassium: 3.8 mmol/L (ref 3.5–5.1)
Sodium: 131 mmol/L — ABNORMAL LOW (ref 135–145)

## 2024-02-10 LAB — PROCALCITONIN: Procalcitonin: <0.1 ng/mL

## 2024-02-10 LAB — CBC
HCT: 24.9 % — ABNORMAL LOW (ref 39.0–52.0)
Hemoglobin: 8.4 g/dL — ABNORMAL LOW (ref 13.0–17.0)
MCH: 31.6 pg (ref 26.0–34.0)
MCHC: 33.7 g/dL (ref 30.0–36.0)
MCV: 93.6 fL (ref 80.0–100.0)
Platelets: 293 10*3/uL (ref 150–400)
RBC: 2.66 MIL/uL — ABNORMAL LOW (ref 4.22–5.81)
RDW: 16.6 % — ABNORMAL HIGH (ref 11.5–15.5)
WBC: 12.6 10*3/uL — ABNORMAL HIGH (ref 4.0–10.5)
nRBC: 0.6 % — ABNORMAL HIGH (ref 0.0–0.2)

## 2024-02-10 LAB — BRAIN NATRIURETIC PEPTIDE: B Natriuretic Peptide: 46 pg/mL (ref 0.0–100.0)

## 2024-02-10 LAB — PHOSPHORUS: Phosphorus: 3.5 mg/dL (ref 2.5–4.6)

## 2024-02-10 LAB — MAGNESIUM: Magnesium: 1.9 mg/dL (ref 1.7–2.4)

## 2024-02-10 MED ORDER — OSMOLITE 1.5 CAL PO LIQD
660.0000 mL | ORAL | Status: DC
  Administered 2024-02-10: 660 mL
  Filled 2024-02-10 (×2): qty 711

## 2024-02-10 MED ORDER — ENSURE ENLIVE PO LIQD
237.0000 mL | Freq: Two times a day (BID) | ORAL | Status: DC
  Administered 2024-02-11 – 2024-02-24 (×12): 237 mL via ORAL
  Filled 2024-02-10 (×8): qty 237

## 2024-02-10 NOTE — Progress Notes (Signed)
 Nutrition Follow-up  DOCUMENTATION CODES:   Not applicable  INTERVENTION:  Modify tube feeding via Cortrak to nocturnal schedule: Osmolite 1.5 at 55 ml/hr from 6PM-6AM (660 ml per day) Prosource TF20 60 ml BID   Provides  1150 kcal (58% of minimum estimated needs), 81 gm protein (77% of minimum estimated needs),  503 ml free water  daily    MVI with minerals daily  Monitor for diet advancement and tolerance Add Ensure Enlive po BID, each supplement provides 350 kcal and 20 grams of protein.    NUTRITION DIAGNOSIS:  Increased nutrient needs related to chronic illness (COPD) as evidenced by estimated needs. - new dx established d/t diet advancement  GOAL:  Patient will meet greater than or equal to 90% of their needs - progressing  MONITOR:  PO intake, Supplement acceptance, Diet advancement  REASON FOR ASSESSMENT:   Consult Enteral/tube feeding initiation and management  ASSESSMENT:  Pt with PMH of COPD and seizure disorder admitted after falling at home dx with acute R SDH with 3 mm midline shift and acute traumatic SAH.  5/9 - admitted; s/p cortrak placement; tip in gastric fundus  5/10 - Cortrak dislodged and NGT placed 5/11 - txr to 5W 5/13 - NGT removed 5/14 - Cortrak placed d/t decline in status, NPO d/t potential for pending surgery 5/15 - TF re-initiated at goal rate 5/15 - EEG: suggestive of cortical dysfunction arising from R hemisphere, mild diffuse encephalopathy 5/17 - LTM EEG discontinued 5/19 - MBS: dysphagia 2, thin liquids, nocturnal feedings to provide 50% of estimated needs   MBS completed today with recommendations for DYS2/thin liquid diet. Will adjust feedings to nocturnal schedule to provide 50% of his needs to allow intake to pick up. MD amicable to this. Spoke with wife at bedside and discussed plan going forward. She plans to order his dinner meal and RN to help capture intake. If intake desirable tonight and tomorrow morning, MD plans to remove  Cortrak. Will add Ensure Enlive to augment intake now that diet advanced.   Admit Weight: 93.7kg Current Weight: 87.5kg   Showed 4.4% weight loss overnight. Weight questionable. Will re-assess next weight collection to establish trend. Some generalized mild edema noted. Bowels stable.    Intake/Output Summary (Last 24 hours) at 02/10/2024 1606 Last data filed at 02/10/2024 0600 Gross per 24 hour  Intake 881.78 ml  Output 902 ml  Net -20.22 ml    Net IO Since Admission: 2,533.64 mL [02/10/24 1606]    Drains/Lines: Cortrak (65cm) PICC (placed 5/14) External catheter UOP: x24 hours   Free water  flushes discontinued d/t worsening hyponatremia.    Meds: SSI 0-9 q4, levothyroxine , MVI, pantoprazole , NaCl Drips: NaCl @ 18ml/hr   Labs:  Na+ 131>128>131 (wdl) WBC 11.2>11.1>12.6(H) CBGs 146-152 x24 hours A1c 6.1 (01/2024)  Diet Order:   Diet Order             Diet NPO time specified Except for: Other (See Comments)  Diet effective now            EDUCATION NEEDS:  Education needs have been addressed  Skin:  Skin Assessment: Skin Integrity Issues: Skin Integrity Issues:: DTI, Stage II DTI: coccyx Stage II: sacrum  Last BM:  5/19  Height:  Ht Readings from Last 1 Encounters:  01/30/24 5\' 6"  (1.676 m)   Weight:  Wt Readings from Last 1 Encounters:  02/10/24 87.5 kg    BMI:  Body mass index is 31.14 kg/m.  Estimated Nutritional Needs:   Kcal:  2000-2300  Protein:  105-125 grams  Fluid:  > 2 L/day  Con Decant MS, RD, LDN Registered Dietitian Clinical Nutrition RD Inpatient Contact Info in Amion

## 2024-02-10 NOTE — Plan of Care (Signed)
   Problem: Fluid Volume: Goal: Ability to maintain a balanced intake and output will improve Outcome: Progressing   Problem: Nutritional: Goal: Maintenance of adequate nutrition will improve Outcome: Progressing

## 2024-02-10 NOTE — Progress Notes (Signed)
 PROGRESS NOTE                                                                                                                                                                                                             Patient Demographics:    [name], is a 63 y.o. male, DOB - 10/17/1960, WNU:[id number]  Outpatient Primary MD for the patient is Claudell Cruz, MD    LOS - 11  Admit date - 01/30/2024    Chief Complaint  Patient presents with   Fall   Altered Mental Status       Brief Narrative (HPI from H&P)     63 year old man who was brought in by EMS from home after a fall.  Apparently wife called, patient is currently ANO x 1 and history obtained after chart review, unable to reach wife.  His heart rate initially was in the 80s, A/O x 1 per EMS, bradycardic to 40s in the ED , noted to be hypothermic 94.  Bruises of multiple ages left hip left eye and left shoulder Labs significant for no leukocytosis, hemoglobin 9.3, mild thrombocytopenia 1 27K, mild hypokalemia 3.3, hypomagnesemia 1.5, normal LFTs.  UDS positive for benzodiazepines, tox screen negative, ammonia 19, TSH 48.  EKG showed sinus bradycardia.  He was given 1 g of magnesium , 100 mg of IV Solu-Cortef  and 200 mics of levothyroxine  IV. Head CT showed 9 mm right subdural hematoma with 3 mm midline shift and small volume acute SAH, cervical spine was negative.  He was admitted to ICU by pulmonary critical care, seen by neurosurgeon Dr. Michale Age, stabilized and transferred to my care on 02/03/2024 on day 4 of his hospital stay, still little confused with NG tube in.  Significant Hospital Events: Including procedures, antibiotic start and stop dates in addition to other pertinent events   5/8 Admitted 5/9 No overnight issues, patient remained confused and restless, trying to get out of bed 5/10 ongoing issues with agitation overnight 5/11 mentation improved this  a.m. with less impulsivity 02/03/2024 transferred to [facility] 02/04/2024 LTM EEG 02/08/2024 LTM EEG discontinued   Subjective:   Afebrile over last 24 hours, he denies any complaints today, reports he is hungry and wants to eat.      Assessment  & Plan :    Acute right subdural hematoma with 3 mm  midline shift - Acute traumatic subarachnoid hemorrhage status post fall  -Repeat head CT 5/9 with slightly increased size of right larger than left subdural hematomas with 3 mm leftward midline shift, neurosurgery on board, appreciate their input.  - repeat CT scan on 02/04/2024 done as he was slightly drowsy, mild worsening on the right sided SDH. -SCD for DVT prophylaxis - Discussed with neurosurgery 5/14, they would like to avoid subjecting him to surgery and a palliative procedure unless he fails maximal medical treatment for seizures   Acute metabolic encephalopathy. - This is multifactorial, in the setting of subdural hematoma, seizures, and multiple AEDs - Neurology input greatly appreciated, will keep close observation given multiple AEDs and encephalopathy - Patient continues to improve on current AED regimen.   -continue with tube feed for now, MBS on Monday.   - Mentation continues to improve, is more awake and communicative this morning  Breakthrough seizures in the setting of subdural hematoma Controlled seizures at baseline -LTM EEG - Management per neurology, did show significant improvement after starting phenobarbital , LTM with no further seizures over last 48 hours, so recommendation is to continue with phenobarbital  65 mg twice daily.  Tube, (to monitor level closely), continue with Vimpat , and Depacon  has been discontinued due to interaction with phenobarbital .   - Management per neurology, continue current antiepileptic regimen, PHB level are therapeutic  Hyponatremia - Euvolemic hyponatremia, did discontinue his free water .  Fever Hypotension -Patient with fever 101.1 on  5/17 PM, septic workup is nonrevealing, negative procalcitonin, chest x-ray with no acute findings, UA still pending, blood culture still pending, continue to monitor off antibiotics.  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  Counseled on compliance.   Bradycardia/hypothermia due to hypothyroidism - resolved - With initiation of IV Synthroid  bradycardia has now resolved, continue to monitor on telemetry.  Echo stable.   Anxiety disorder on chronic benzodiazepine -It appears patient is on high-dose benzodiazepines upwards of 4 mg of Xanax  nightly Continue reduced dosed home Xanax  1 mg twice daily Monitor for signs of withdrawal   COPD, not in exacerbation Tobacco dependence P: Aspiration precautions Continue home Brovana  and Yupelri  Smoking cessation education when appropriate     UTI.   - Culture growing E. coli, treated with Rocephin  5 doses  Hypertension - Pressure was elevated required multiple medications to be added, but blood pressure is low today so all has been discontinued.    Anemia - Monitor closely, no indication for transfusion, some dilutional effect.  Overall stable with no evidence of GI bleed  Prediabetes P: Continue SSI CBG goal 140-180 CBG checks every 4  Lab Results  Component Value Date   HGBA1C 6.1 (H) 01/31/2024   CBG (last 3)  Recent Labs    02/10/24 0821 02/10/24 0843 02/10/24 1146  GLUCAP 149* 158* 141*          Condition -  Guarded  Family Communication  :  Discussed with wife at bedside daily  Code Status : Full code  Consults  : PCCM, neurosurgery Dr. Michale Age, neurology, nephrology  PUD Prophylaxis : PPI   Procedures  :     EEG 02/03/2024.  No seizures.     CT head 02/04/2024.  Noted discussed with Dr. Michale Age neurosurgery.  No interventions needed.    1. Largely  resolved left side but Increased Right Side Subdural  Hematoma since 01/31/2024. Right SDH now up to 11 mm with increased leftward midline shift, (now 4-5 mm) and increased mass effect on the right lateral ventricle. 2. Small volume SAH now apparent. Stable small volume IVH. No acute ventriculomegaly. Possible small superimposed right posterior Emma sphere hemorrhagic contusion. 3. No skull fracture identified.   CT 5/9 -  1. Slightly increased size of right larger than left subdural hematomas. Unchanged mass effect and 3 mm of leftward midline shift. 2. Small volume subarachnoid hemorrhage with mildly increased hemorrhage in the left lateral ventricle, possibly redistribution.  CT 5/8 -   1. Acute 9 mm thick right cerebral convexity subdural hemorrhage which extends along the right tentorial leaflet. Resulting 3 mm leftward midline shift. 2. Scattered small volume of acute subarachnoid hemorrhage. 3. No evidence of acute fracture or traumatic malalignment in the cervical spine.  TTE - 1. Left ventricular ejection fraction, by estimation, is 60 to 65% . The left ventricle has normal function. The left ventricle has no regional wall motion abnormalities. Left ventricular diastolic parameters are consistent with Grade I diastolic dysfunction ( impaired relaxation) . 2. Right ventricular systolic function is normal. The right ventricular size is normal. 3. The mitral valve is normal in structure. No evidence of mitral valve regurgitation. No evidence of mitral stenosis. 4. The aortic valve was not well visualized. Aortic valve regurgitation is mild. No aortic stenosis is present. 5. The inferior vena cava is normal in size with greater than 50% respiratory variability, suggesting right atrial pressure of 3 mmHg.      Disposition Plan  :    Status is: Inpatient  DVT Prophylaxis  :    Place and maintain sequential compression device Start: 02/05/24 0657 SCDs Start: 01/30/24 2330   Lab Results   Component Value Date   PLT 293 02/10/2024    Diet :  Diet Order             Diet NPO time specified Except for: Other (See Comments)  Diet effective now                    Inpatient Medications  Scheduled Meds:  ALPRAZolam   0.5 mg Oral BID   arformoterol   15 mcg Nebulization BID   budesonide  (PULMICORT ) nebulizer solution  0.25 mg Nebulization BID   Chlorhexidine  Gluconate Cloth  6 each Topical Daily   feeding supplement (PROSource TF20)  60 mL Per Tube BID   insulin  aspart  0-9 Units Subcutaneous Q4H   lacosamide   200 mg Per NG tube BID   levothyroxine   150 mcg Oral Q0600   multivitamin with minerals  1 tablet Oral Daily   mouth rinse  15 mL Mouth Rinse 4 times per day   pantoprazole  (PROTONIX ) IV  40 mg Intravenous QHS   phenobarbital   64.8 mg Per Tube BID   revefenacin   175 mcg Nebulization Daily   sodium chloride  flush  10-40 mL Intracatheter Q12H   sodium chloride   1 g Oral BID   Continuous Infusions:  sodium chloride  75 mL/hr at 02/10/24 0036   feeding supplement (OSMOLITE 1.5 CAL) 55 mL/hr at 02/09/24 1639   PRN Meds:.acetaminophen , docusate sodium , haloperidol  lactate, hydrALAZINE , hydrALAZINE , labetalol , polyethylene glycol, sodium chloride  flush, traMADol   Antibiotics  :    Anti-infectives (From admission, onward)    Start     Dose/Rate Route Frequency Ordered Stop   02/04/24 1030  cefTRIAXone  (ROCEPHIN ) 1 g in sodium chloride  0.9 % 100 mL IVPB  1 g 200 mL/hr over 30 Minutes Intravenous Every 24 hours 02/04/24 0937 02/08/24 1257         Objective:   Vitals:   02/10/24 0710 02/10/24 0840 02/10/24 1145 02/10/24 1200  BP:  132/71 124/89 123/62  Pulse:  (!) 104 (!) 108 (!) 104  Resp:  14 16 17   Temp:  97.7 F (36.5 C) 98.1 F (36.7 C) 98.1 F (36.7 C)  TempSrc:  Oral Axillary Axillary  SpO2:  95% 97% 97%  Weight: 87.5 kg     Height:        Wt Readings from Last 3 Encounters:  02/10/24 87.5 kg  09/06/23 94.8 kg  07/11/22 103.4 kg      Intake/Output Summary (Last 24 hours) at 02/10/2024 1411 Last data filed at 02/10/2024 0600 Gross per 24 hour  Intake 1271.28 ml  Output 902 ml  Net 369.28 ml     Physical Exam  Awake and alert this morning, more interactive, lateral orbital bruising with no significant change Symmetrical Chest wall movement, Good air movement bilaterally, CTAB RRR,No Gallops,Rubs or new Murmurs, No Parasternal Heave +ve B.Sounds, Abd Soft, No tenderness, No rebound - guarding or rigidity. No Cyanosis, Clubbing or edema, significant bruising in bilateral feet due to   RN pressure injury documentation: Pressure Injury 01/31/24 Coccyx Mid Deep Tissue Pressure Injury - Purple or maroon localized area of discolored intact skin or blood-filled blister due to damage of underlying soft tissue from pressure and/or shear. (Active)  01/31/24 0100  Location: Coccyx  Location Orientation: Mid  Staging: Deep Tissue Pressure Injury - Purple or maroon localized area of discolored intact skin or blood-filled blister due to damage of underlying soft tissue from pressure and/or shear.  Wound Description (Comments):   Present on Admission: Yes  Dressing Type Foam - Lift dressing to assess site every shift 02/10/24 0905      Data Review:    Recent Labs  Lab 02/04/24 0436 02/05/24 0432 02/06/24 0443 02/07/24 0436 02/08/24 0258 02/09/24 1227 02/10/24 0253  WBC 16.5* 14.6* 10.6* 10.3 11.2* 11.1* 12.6*  HGB 10.6* 10.5* 9.6* 8.7* 8.4* 7.9* 8.4*  HCT 29.7* 29.9* 28.2* 25.5* 24.3* 23.3* 24.9*  PLT 162 195 189 239 247 268 293  MCV 86.8 87.9 91.0 92.4 92.7 94.0 93.6  MCH 31.0 30.9 31.0 31.5 32.1 31.9 31.6  MCHC 35.7 35.1 34.0 34.1 34.6 33.9 33.7  RDW 15.3 15.9* 16.3* 16.6* 16.8* 16.9* 16.6*  LYMPHSABS 3.2 3.2 2.9 2.6 2.6  --   --   MONOABS 2.5* 2.1* 1.4* 1.4* 1.9*  --   --   EOSABS 0.0 0.0 0.0 0.0 0.1  --   --   BASOSABS 0.0 0.0 0.0 0.0 0.1  --   --     Recent Labs  Lab 02/04/24 0436 02/05/24 0432  02/05/24 0857 02/06/24 0443 02/07/24 0436 02/08/24 0258 02/09/24 1227 02/09/24 1322 02/10/24 0253  NA 128* 131*  --  132* 133* 131* 128*  --  131*  K 3.3* 3.8  --  3.6 3.4* 3.9 3.7  --  3.8  CL 91* 97*  --  100 104 100 99  --  99  CO2 26 23  --  21* 22 24 24   --  23  ANIONGAP 11 11  --  11 7 7 5   --  9  GLUCOSE 144* 151*  --  125* 167* 158* 152*  --  146*  BUN 22 21  --  22 21 24* 21  --  20  CREATININE 0.65 0.65  --  0.68 0.57* 0.62 0.52*  --  0.51*  AST 36 50*  --  31 38 30  --   --   --   ALT 32 40  --  34 31 28  --   --   --   ALKPHOS 47 53  --  54 53 52  --   --   --   BILITOT 0.9 0.7  --  0.6 0.7 0.6  --   --   --   ALBUMIN 3.5 3.4*  --  3.0* 2.7* 2.8*  --   --   --   CRP 1.7* 1.9*  --  1.9* 3.0*  --   --   --   --   PROCALCITON 0.14 0.10  --  0.24 0.14 <0.10  --  <0.10 <0.10  INR  --   --  1.0  --   --   --   --   --   --   BNP  --   --   --   --   --   --   --  20.9 46.0  MG 2.1 2.0  --  1.9 1.9  --  1.9  --  1.9  PHOS 3.1 3.5  --  3.4 2.5  --   --   --  3.5  CALCIUM  9.2 9.1  --  8.4* 8.1* 8.3* 8.4*  --  8.7*      Recent Labs  Lab 02/04/24 0436 02/05/24 0432 02/05/24 0857 02/06/24 0443 02/07/24 0436 02/08/24 0258 02/09/24 1227 02/09/24 1322 02/10/24 0253  CRP 1.7* 1.9*  --  1.9* 3.0*  --   --   --   --   PROCALCITON 0.14 0.10  --  0.24 0.14 <0.10  --  <0.10 <0.10  INR  --   --  1.0  --   --   --   --   --   --   BNP  --   --   --   --   --   --   --  20.9 46.0  MG 2.1 2.0  --  1.9 1.9  --  1.9  --  1.9  CALCIUM  9.2 9.1  --  8.4* 8.1* 8.3* 8.4*  --  8.7*    --------------------------------------------------------------------------------------------------------------- No results found for: "CHOL", "HDL", "LDLCALC", "LDLDIRECT", "TRIG", "CHOLHDL"  Lab Results  Component Value Date   HGBA1C 6.1 (H) 01/31/2024   No results for input(s): "TSH", "T4TOTAL", "FREET4", "T3FREE", "THYROIDAB" in the last 72 hours.     Micro Results Recent Results (from the  past 240 hours)  Urine Culture     Status: Abnormal   Collection Time: 02/03/24  8:22 AM   Specimen: Urine, Random  Result Value Ref Range Status   Specimen Description URINE, RANDOM  Final   Special Requests   Final    NONE Reflexed from [id number] Performed at [facility], [address] [place], [place] [address]    Culture >=100,000 COLONIES/mL ESCHERICHIA COLI (A)  Final   Report Status 02/05/2024 FINAL  Final   Organism ID, Bacteria ESCHERICHIA COLI (A)  Final      Susceptibility   Escherichia coli - MIC*    AMPICILLIN <=2 SENSITIVE Sensitive     CEFAZOLIN <=4 SENSITIVE Sensitive     CEFEPIME <=0.12 SENSITIVE Sensitive     CEFTRIAXONE  <=0.25 SENSITIVE Sensitive     CIPROFLOXACIN  <=0.25 SENSITIVE Sensitive  GENTAMICIN <=1 SENSITIVE Sensitive     IMIPENEM <=0.25 SENSITIVE Sensitive     NITROFURANTOIN <=16 SENSITIVE Sensitive     TRIMETH /SULFA  <=20 SENSITIVE Sensitive     AMPICILLIN/SULBACTAM <=2 SENSITIVE Sensitive     PIP/TAZO <=4 SENSITIVE Sensitive ug/mL    * >=100,000 COLONIES/mL ESCHERICHIA COLI  Culture, blood (Routine X 2) w Reflex to ID Panel     Status: None (Preliminary result)   Collection Time: 02/09/24  2:51 PM   Specimen: BLOOD LEFT HAND  Result Value Ref Range Status   Specimen Description BLOOD LEFT HAND  Final   Special Requests   Final    BOTTLES DRAWN AEROBIC AND ANAEROBIC Blood Culture adequate volume   Culture   Final    NO GROWTH < 24 HOURS Performed at [facility], [address] [place], [place] [address]    Report Status PENDING  Incomplete  Culture, blood (Routine X 2) w Reflex to ID Panel     Status: None (Preliminary result)   Collection Time: 02/09/24  2:51 PM   Specimen: BLOOD LEFT ARM  Result Value Ref Range Status   Specimen Description BLOOD LEFT ARM  Final   Special Requests   Final    BOTTLES DRAWN AEROBIC AND ANAEROBIC Blood Culture adequate volume   Culture   Final    NO GROWTH < 24 HOURS Performed at [facility], [address], [place], [place] [address]    Report Status PENDING  Incomplete    Radiology Report DG Chest Port 1 View Result Date: 02/09/2024 CLINICAL DATA:  Fever. EXAM: PORTABLE CHEST 1 VIEW COMPARISON:  Feb 04, 2024. FINDINGS: Stable cardiomediastinal silhouette. Feeding tube is seen entering stomach. No acute pulmonary disease. Right-sided PICC line is noted with tip in expected position of cavoatrial junction. Bony thorax is unremarkable. IMPRESSION: No active disease. Electronically Signed   By: Rosalene Colon M.D.   On: 02/09/2024 14:27     .rad Signature  Kelleen Patee Parry Po M.D on 02/10/2024 at 2:11 PM   -  To page go to [email or web address]

## 2024-02-10 NOTE — Plan of Care (Signed)

## 2024-02-10 NOTE — Progress Notes (Signed)
 Speech Language Pathology Treatment: Dysphagia  Patient Details Name: [name] MRN: [id number] DOB: 04-25-1961 Today's Date: 02/10/2024 Time: 4782-9562 SLP Time Calculation (min) (ACUTE ONLY): 30 min  Assessment / Plan / Recommendation Clinical Impression  Pt seen at bedside to assess readiness for PO intake/instrumental study. Pt with left gaze preference and twitching of the right upper lip. Wife present, and reports twitching is baseline for him. RN/MD informed, as SLP did not observe this during last session. Pt able to answer questions with clear voice and intelligible speech.  Pt tolerated oral care with suction. He exhibits stronger volitional cough today. Cough is still congested but nonproductive. Pt accepted trials of ice chips, thin liquid via straw, and pudding. Improved tolerance at bedside today without cough or throat clearing after the swallow. Continue to recommend instrumental assessment prior to beginning PO intake. RN present and provided seizure meds via Cortrak. Recommend scheduling MBS for this afternoon. Will coordinate with radiology, SLP team, and RN for appropriate timing to maximize pt alertness and success.   HPI HPI: Pt is a 63 y.o. male who was brought to ED by EMS from home after a fall. CT head (01/30/23) revealed "Slightly increased size of right larger than left subdural hematomas. Small volume subarachnoid hemorrhage with mildly increased hemorrhage in the left lateral ventricle. Failed yale 02/01/24. PMH: Seizure disorder, COPD; ST f/u for po readiness in acute setting; pt currently has TF for nutritive/hydration purposes.      SLP Plan  Continue with current plan of care;MBS      Recommendations for follow up therapy are one component of a multi-disciplinary discharge planning process, led by the attending physician.  Recommendations may be updated based on patient status, additional functional criteria and insurance authorization.    Recommendations   Diet recommendations: NPO Medication Administration: Via alternative means              Oral care QID;Oral care prior to ice chip/H20;Staff/trained caregiver to provide oral care   Frequent or constant Supervision/Assistance Dysphagia, unspecified (R13.10)     Continue with current plan of care;MBS    [name] B. Arby Beam, MSP, CCC-SLP Speech Language Pathologist Office: [phone]  [name] 02/10/2024, 9:27 AM

## 2024-02-10 NOTE — Progress Notes (Signed)
 Modified Barium Swallow Study  Patient Details  Name: [name] MRN: [id number] Date of Birth: January 02, 1961  Today's Date: 02/10/2024  Modified Barium Swallow completed.  Full report located under Chart Review in the Imaging Section.  History of Present Illness Pt is a 63 y.o. male who was brought to ED by EMS from home after a fall. CT head (01/30/23) revealed "Slightly increased size of right larger than left subdural hematomas. Small volume subarachnoid hemorrhage with mildly increased hemorrhage in the left lateral ventricle. Failed yale 02/01/24. PMH: Seizure disorder, COPD; ST f/u for po readiness in acute setting; pt currently has TF for nutritive/hydration purposes.   Clinical Impression Pt presents with functional airway protection but oral dysphagia, which is suspected to be secondary to cognitive factors. His positioning was suboptimal as his shoulders were drawn up, prohibiting a complete view of the trachea. Trace penetration was observed with thin liquids but was expelled as complete laryngeal elevation occurred (PAS 2, considered WFL). Of note, there was frequent coughing and throat clearance throughout the study in the absence of airway invasion. Ultimately, there was no significant residue but pt had increased difficulty with initiating mastication when presented solids. He initially declined to try solids so question effort. Attempted to present the 13 mm barium tablet with thin liquids but pt expectorated it before swallowing and it was not reattempted. Suspect his level of alertness may fluctuate given administration of antiepileptics which have been observed to cause lethargy but feel he could start a diet if given full supervision. Since observation of mastication was limited, recommend starting with Dys 2 solids and thin liquids. Recommend pills whole with puree or via Cortrak. SLP will continue following but do not anticipate post-acute needs for swallowing. Factors that may  increase risk of adverse event in presence of aspiration Roderick Civatte & Jessy Morocco 2021): Reduced cognitive function;Limited mobility;Dependence for feeding and/or oral hygiene;Presence of tubes (ETT, trach, NG, etc.)  Swallow Evaluation Recommendations Recommendations: PO diet PO Diet Recommendation: Dysphagia 2 (Finely chopped);Thin liquids (Level 0) Liquid Administration via: Cup;Straw Medication Administration: Whole meds with puree Supervision: Full supervision/cueing for swallowing strategies;Staff to assist with self-feeding Swallowing strategies  : Minimize environmental distractions;Slow rate Postural changes: Position pt fully upright for meals;Stay upright 30-60 min after meals Oral care recommendations: Oral care BID (2x/day)      Amil Kale, M.A., CCC-SLP Speech Language Pathology, Acute Rehabilitation Services  Secure Chat preferred [phone]  02/10/2024,2:28 PM

## 2024-02-10 NOTE — TOC Progression Note (Signed)
 Transition of Care [facility]) - Progression Note    Patient Details  Name: [name] MRN: [id number] Date of Birth: 12/10/1960  [facility] CM/SW Contact  Jannice Mends, LCSW Phone Number: 02/10/2024, 8:57 AM  Clinical Narrative:    CSW continuing to follow. Pasrr still pending in person review with Lenise Quince.    Expected Discharge Plan: Skilled Nursing Facility Barriers to Discharge: Awaiting State Approval (PASRR), Continued Medical Work up, SNF Pending bed offer  Expected Discharge Plan and Services In-house Referral: Clinical Social Work   Post Acute Care Choice: Skilled Nursing Facility Living arrangements for the past 2 months: Single Family Home                                       Social Determinants of Health (SDOH) Interventions SDOH Screenings   Food Insecurity: Patient Unable To Answer (02/02/2024)  Housing: Patient Unable To Answer (02/02/2024)  Transportation Needs: Patient Unable To Answer (02/02/2024)  Utilities: Patient Unable To Answer (02/02/2024)  Financial Resource Strain: Low Risk  (12/31/2022)   Received from [facility]  Physical Activity: Unknown (03/27/2023)   Received from [facility]  Social Connections: Somewhat Isolated (03/27/2023)   Received from [facility]  Stress: Stress Concern Present (03/27/2023)   Received from [facility]  Tobacco Use: High Risk (01/30/2024)    Readmission Risk Interventions     No data to display

## 2024-02-10 NOTE — Progress Notes (Signed)
 SLP Cancellation Note  Patient Details Name: [name] MRN: [id number] DOB: January 19, 1961   Cancelled treatment:       Unable to complete MBS this date. Will coordinate schedule for [facility] Tuesday 02/11/24 with radiology and RN, regarding best timing with seizure medications to maximize alertness and best performance.  Kahlan Engebretson B. Arby Beam, MSP, CCC-SLP Speech Language Pathologist Office: [phone]  Adine Ahmadi 02/10/2024, 12:22 PM

## 2024-02-10 NOTE — Progress Notes (Signed)
 Physical Therapy Treatment Patient Details Name: [name] MRN: [id number] DOB: 12-Feb-1961 Today's Date: 02/10/2024   History of Present Illness Pt is a 63 y.o. male presenting 5/8 after a fall at home. UDS positive for benzodiazepines. EKG with sinus bradycardia. Head CT 5/8 showed 9 mm right subdural hematoma with 3 mm midline shift and small volume acute SAH.  Pt on continuous EEG 5/14 with continued seizures.  PMH significant for seizures and COPD.    PT Comments  Total assist with rolling and sidelying to sit transition, but becomes more alert at EOB. Tolerated sitting EOB majority of session. Progressed to single UE support holding Stedy (with RUE) and min assist for trunk support due to heavy lean.) Cannot sit EOB without support at this time. Able to lean onto Rt elbow to assist with return to supine (max A.) Attempted stand without success (too weak/confused.) Patient will continue to benefit from skilled physical therapy services to further improve independence with functional mobility.     If plan is discharge home, recommend the following: Two people to help with walking and/or transfers;Two people to help with bathing/dressing/bathroom   Can travel by private vehicle     No  Equipment Recommendations  Other (comment) (defer to next venue)    Recommendations for Other Services       Precautions / Restrictions Precautions Precautions: Fall Recall of Precautions/Restrictions: Impaired Precaution/Restrictions Comments: NG tube Restrictions Weight Bearing Restrictions Per Provider Order: No     Mobility  Bed Mobility Overal bed mobility: Needs Assistance Bed Mobility: Rolling, Sit to Supine, Sidelying to Sit Rolling: Used rails, Total assist Sidelying to sit: HOB elevated, Used rails, Total assist   Sit to supine: Max assist   General bed mobility comments: Total A assist to roll and rise, supporting trunk and maneuvering LEs off bed. Able to open hand and reach  minimally, not sustaining grip on rail. Max assist for LEs back into bed, pt was able to lean down onto Rt elbow.    Transfers                   General transfer comment: Unable to stabilize on edge of bed to attempt stand with Stedy.    Ambulation/Gait                   Stairs             Wheelchair Mobility     Tilt Bed    Modified Rankin (Stroke Patients Only)       Balance Overall balance assessment: Needs assistance Sitting-balance support: Feet supported, Single extremity supported Sitting balance-Leahy Scale: Zero Sitting balance - Comments: posterior lean, minimal righting reactions Postural control: Posterior lean                                  Communication Communication Communication: No apparent difficulties  Cognition Arousal: Lethargic Behavior During Therapy: Flat affect   PT - Cognitive impairments: Difficult to assess, Orientation, Awareness, Memory, Sequencing, Problem solving, Safety/Judgement, Attention, Initiation, No family/caregiver present to determine baseline   Orientation impairments: Time, Situation                   PT - Cognition Comments: Oriented to self (name), Following commands: Impaired Following commands impaired: Follows one step commands with increased time, Follows one step commands inconsistently    Cueing Cueing Techniques: Verbal cues, Tactile cues  Exercises  General Exercises - Lower Extremity Ankle Circles/Pumps: AROM, Both, Supine, 10 reps Quad Sets: AROM, Both, Supine, 10 reps    General Comments General comments (skin integrity, edema, etc.): HR 108, SpO2 96% on RA, BP 114/62      Pertinent Vitals/Pain Pain Assessment Pain Assessment: Faces Faces Pain Scale: Hurts little more Pain Location: "all over" Pain Descriptors / Indicators: Aching Pain Intervention(s): Monitored during session, Limited activity within patient's tolerance, Repositioned    Home Living                           Prior Function            PT Goals (current goals can now be found in the care plan section) Acute Rehab PT Goals Patient Stated Goal: none stated PT Goal Formulation: Patient unable to participate in goal setting Time For Goal Achievement: 02/21/24 Potential to Achieve Goals: Fair Progress towards PT goals: Progressing toward goals    Frequency    Min 2X/week      PT Plan      Co-evaluation              AM-PAC PT "6 Clicks" Mobility   Outcome Measure  Help needed turning from your back to your side while in a flat bed without using bedrails?: Total Help needed moving from lying on your back to sitting on the side of a flat bed without using bedrails?: Total Help needed moving to and from a bed to a chair (including a wheelchair)?: Total Help needed standing up from a chair using your arms (e.g., wheelchair or bedside chair)?: Total Help needed to walk in hospital room?: Total Help needed climbing 3-5 steps with a railing? : Total 6 Click Score: 6    End of Session Equipment Utilized During Treatment: Gait belt Activity Tolerance: Patient limited by fatigue;Patient limited by lethargy Patient left: in bed;with call bell/phone within reach;with bed alarm set;with SCD's reapplied Nurse Communication: Mobility status;Need for lift equipment PT Visit Diagnosis: Unsteadiness on feet (R26.81);Muscle weakness (generalized) (M62.81);History of falling (Z91.81);Other abnormalities of gait and mobility (R26.89);Difficulty in walking, not elsewhere classified (R26.2);Other symptoms and signs involving the nervous system (R29.898)     Time: 1110-1140 PT Time Calculation (min) (ACUTE ONLY): 30 min  Charges:    $Therapeutic Activity: 23-37 mins PT General Charges $$ ACUTE PT VISIT: 1 Visit                     Jory Ng, PT, DPT [facility]  Rehabilitation Services Physical Therapist Office: [phone] Website:  [email or web address]    Alinda Irani 02/10/2024, 1:21 PM

## 2024-02-11 ENCOUNTER — Other Ambulatory Visit (HOSPITAL_COMMUNITY): Payer: Self-pay

## 2024-02-11 DIAGNOSIS — S065XAA Traumatic subdural hemorrhage with loss of consciousness status unknown, initial encounter: Secondary | ICD-10-CM | POA: Diagnosis not present

## 2024-02-11 LAB — CBC
HCT: 24.1 % — ABNORMAL LOW (ref 39.0–52.0)
Hemoglobin: 8.1 g/dL — ABNORMAL LOW (ref 13.0–17.0)
MCH: 31.5 pg (ref 26.0–34.0)
MCHC: 33.6 g/dL (ref 30.0–36.0)
MCV: 93.8 fL (ref 80.0–100.0)
Platelets: 310 10*3/uL (ref 150–400)
RBC: 2.57 MIL/uL — ABNORMAL LOW (ref 4.22–5.81)
RDW: 17.2 % — ABNORMAL HIGH (ref 11.5–15.5)
WBC: 11.1 10*3/uL — ABNORMAL HIGH (ref 4.0–10.5)
nRBC: 1.2 % — ABNORMAL HIGH (ref 0.0–0.2)

## 2024-02-11 LAB — GLUCOSE, CAPILLARY
Glucose-Capillary: 167 mg/dL — ABNORMAL HIGH (ref 70–99)
Glucose-Capillary: 136 mg/dL — ABNORMAL HIGH (ref 70–99)
Glucose-Capillary: 153 mg/dL — ABNORMAL HIGH (ref 70–99)
Glucose-Capillary: 173 mg/dL — ABNORMAL HIGH (ref 70–99)
Glucose-Capillary: 127 mg/dL — ABNORMAL HIGH (ref 70–99)
Glucose-Capillary: 150 mg/dL — ABNORMAL HIGH (ref 70–99)

## 2024-02-11 LAB — BASIC METABOLIC PANEL WITH GFR
Anion gap: 7 (ref 5–15)
BUN: 18 mg/dL (ref 8–23)
CO2: 23 mmol/L (ref 22–32)
Calcium: 8.4 mg/dL — ABNORMAL LOW (ref 8.9–10.3)
Chloride: 101 mmol/L (ref 98–111)
Creatinine, Ser: 0.55 mg/dL — ABNORMAL LOW (ref 0.61–1.24)
GFR, Estimated: >60 mL/min (ref >60)
Glucose, Bld: 167 mg/dL — ABNORMAL HIGH (ref 70–99)
Potassium: 3.8 mmol/L (ref 3.5–5.1)
Sodium: 131 mmol/L — ABNORMAL LOW (ref 135–145)

## 2024-02-11 LAB — PHOSPHORUS: Phosphorus: 3.2 mg/dL (ref 2.5–4.6)

## 2024-02-11 LAB — MAGNESIUM: Magnesium: 1.8 mg/dL (ref 1.7–2.4)

## 2024-02-11 MED ORDER — METOPROLOL TARTRATE 25 MG PO TABS
25.0000 mg | ORAL_TABLET | Freq: Two times a day (BID) | ORAL | Status: DC
  Administered 2024-02-11 – 2024-02-18 (×14): 25 mg via ORAL
  Filled 2024-02-11 (×15): qty 1

## 2024-02-11 NOTE — Progress Notes (Addendum)
 Speech Language Pathology Treatment: Dysphagia;Cognitive-Linquistic  Patient Details Name: [name] MRN: [id number] DOB: 24-Jul-1961 Today's Date: 02/11/2024 Time: 6962-9528 SLP Time Calculation (min) (ACUTE ONLY): 22 min  Assessment / Plan / Recommendation Clinical Impression  Pt had recently received his seizure medication, resulting in lethargy. He roused briefly for limited trials of thin liquids and pudding but could not sustain alertness long enough to masticate advanced solids. He only intermittently followed commands. His wife reports pt has consumed almost all of his meal trays and has been eager for POs when he is more alert. For now, recommend he continue Dys 2 diet with thin liquids when fully awake and alert. SLP will continue attempts to coordinate visits around administration of seizure meds to assess ability to advance diet given edentulism; however, question his ability to support himself with POs alone if he remains this lethargic throughout the majority of the day.      HPI HPI: Pt is a 63 y.o. male who was brought to ED by EMS from home after a fall. CT head (01/30/23) revealed "Slightly increased size of right larger than left subdural hematomas. Small volume subarachnoid hemorrhage with mildly increased hemorrhage in the left lateral ventricle. Failed yale 02/01/24. PMH: Seizure disorder, COPD; ST f/u for po readiness in acute setting; pt currently has TF for nutritive/hydration purposes.      SLP Plan  Continue with current plan of care      Recommendations for follow up therapy are one component of a multi-disciplinary discharge planning process, led by the attending physician.  Recommendations may be updated based on patient status, additional functional criteria and insurance authorization.    Recommendations  Diet recommendations: Dysphagia 2 (fine chop);Thin liquid Liquids provided via: Cup;Straw Medication Administration: Whole meds with puree Supervision: Full  supervision/cueing for compensatory strategies;Trained caregiver to feed patient Compensations: Slow rate;Small sips/bites;Minimize environmental distractions Postural Changes and/or Swallow Maneuvers: Seated upright 90 degrees                  Oral care BID;Staff/trained caregiver to provide oral care   Frequent or constant Supervision/Assistance Dysphagia, oropharyngeal phase (R13.12)     Continue with current plan of care     Amil Kale, M.A., CCC-SLP Speech Language Pathology, Acute Rehabilitation Services  Secure Chat preferred [phone]   02/11/2024, 11:35 AM

## 2024-02-11 NOTE — TOC Progression Note (Addendum)
 Transition of Care [facility]) - Progression Note    Patient Details  Name: [name] MRN: [id number] Date of Birth: [birth year]  [facility]) CM/SW Contact  Jannice Mends, LCSW Phone Number: 02/11/2024, 4:01 PM  Clinical Narrative:    4pm-CSW left voicemail for spouse and left SNF list and Medicare ratings in the room in case she comes by.   4:21 PM-CSW received return call from patient's spouse. CSW discussed SNF options and spouse chose [facility] due to proximity to their home as she does not want to drive far. She shared that patient calls her "Mandy". Patient will require insurance approval by [facility]. Cortrak to be removed today per MD.   Pasrr still pending.     Expected Discharge Plan: Skilled Nursing Facility Barriers to Discharge: Awaiting State Approval (PASRR), Continued Medical Work up, SNF Pending bed offer  Expected Discharge Plan and Services In-house Referral: Clinical Social Work   Post Acute Care Choice: Skilled Nursing Facility Living arrangements for the past 2 months: Single Family Home                                       Social Determinants of Health (SDOH) Interventions SDOH Screenings   Food Insecurity: Patient Unable To Answer (02/02/2024)  Housing: Patient Unable To Answer (02/02/2024)  Transportation Needs: Patient Unable To Answer (02/02/2024)  Utilities: Patient Unable To Answer (02/02/2024)  Financial Resource Strain: Low Risk  (12/31/2022)   Received from [facility]  Physical Activity: Unknown (03/27/2023)   Received from [facility]  Social Connections: Somewhat Isolated (03/27/2023)   Received from [facility]  Stress: Stress Concern Present (03/27/2023)   Received from [facility]  Tobacco Use: High Risk (01/30/2024)    Readmission Risk Interventions     No data to display

## 2024-02-11 NOTE — Progress Notes (Signed)
 PROGRESS NOTE                                                                                                                                                                                                             Patient Demographics:    [name], is a 63 y.o. male, DOB - 06-Nov-1960, JYN:[id number]  Outpatient Primary MD for the patient is Claudell Cruz, MD    LOS - 12  Admit date - 01/30/2024    Chief Complaint  Patient presents with   Fall   Altered Mental Status       Brief Narrative (HPI from H&P)     63 year old man who was brought in by EMS from home after a fall.  Apparently wife called, patient is currently ANO x 1 and history obtained after chart review, unable to reach wife.  His heart rate initially was in the 80s, A/O x 1 per EMS, bradycardic to 40s in the ED , noted to be hypothermic 94.  Bruises of multiple ages left hip left eye and left shoulder Labs significant for no leukocytosis, hemoglobin 9.3, mild thrombocytopenia 1 27K, mild hypokalemia 3.3, hypomagnesemia 1.5, normal LFTs.  UDS positive for benzodiazepines, tox screen negative, ammonia 19, TSH 48.  EKG showed sinus bradycardia.  He was given 1 g of magnesium , 100 mg of IV Solu-Cortef  and 200 mics of levothyroxine  IV. Head CT showed 9 mm right subdural hematoma with 3 mm midline shift and small volume acute SAH, cervical spine was negative.  He was admitted to ICU by pulmonary critical care, seen by neurosurgeon Dr. Michale Age, stabilized and transferred to my care on 02/03/2024 on day 4 of his hospital stay, still little confused with NG tube in.  Significant Hospital Events: Including procedures, antibiotic start and stop dates in addition to other pertinent events   5/8 Admitted 5/9 No overnight issues, patient remained confused and restless, trying to get out of bed 5/10 ongoing issues with agitation overnight 5/11 mentation improved this  a.m. with less impulsivity 02/03/2024 transferred to [facility] 02/04/2024 LTM EEG 02/08/2024 LTM EEG discontinued   Subjective:   No significant events overnight, started on a diet and was advanced after MBS yesterday, requesting his NG tube to be discontinued.       Assessment  & Plan :    Acute right subdural  hematoma with 3 mm midline shift - Acute traumatic subarachnoid hemorrhage status post fall  -Repeat head CT 5/9 with slightly increased size of right larger than left subdural hematomas with 3 mm leftward midline shift, neurosurgery on board, appreciate their input.  - repeat CT scan on 02/04/2024 done as he was slightly drowsy, mild worsening on the right sided SDH. -SCD for DVT prophylaxis - Discussed with neurosurgery 5/14, they would like to avoid subjecting him to surgery and a palliative procedure unless he fails maximal medical treatment for seizures   Acute metabolic encephalopathy. - This is multifactorial, in the setting of subdural hematoma, seizures, and multiple AEDs - Neurology input greatly appreciated, will keep close observation given multiple AEDs and encephalopathy - Patient continues to improve on current AED regimen.   - NG-tube inserted last week, on tube feed, did well with MBS yesterday, good oral intake - Mentation continues to improve, is more awake and communicative, able to eat, good appetite, will DC NG tube  Breakthrough seizures in the setting of subdural hematoma Controlled seizures at baseline -LTM EEG - Management per neurology, did show significant improvement after starting phenobarbital , LTM with no further seizures over last 48 hours, so recommendation is to continue with phenobarbital  65 mg twice daily.  Tube, (to monitor level closely), continue with Vimpat , and Depacon  has been discontinued due to interaction with phenobarbital .   - Management per neurology, continue current antiepileptic regimen, PHB level are therapeutic  Hyponatremia -  Euvolemic hyponatremia, did discontinue his free water .  Fever Hypotension -Patient with fever 101.1 on 5/17 PM, septic workup is nonrevealing, negative procalcitonin, chest x-ray with no acute findings, UA is negative, blood culture still pending, continue to monitor off antibiotics.  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  Counseled on compliance.   Bradycardia/hypothermia due to hypothyroidism - resolved - With initiation of IV Synthroid  bradycardia has now resolved, continue to monitor on telemetry.  Echo stable.   Anxiety disorder on chronic benzodiazepine -It appears patient is on high-dose benzodiazepines upwards of 4 mg of Xanax  nightly Continue reduced dosed home Xanax  1 mg twice daily Monitor for signs of withdrawal   COPD, not in exacerbation Tobacco dependence P: Aspiration precautions Continue home Brovana  and Yupelri  Smoking cessation education when appropriate     UTI.   - Culture growing E. coli, treated with Rocephin  5 doses  Hypertension - Pressure was elevated required multiple medications to be added, but blood pressure been soft recently so all meds has been discontinued, he is back on metoprolol  today given blood pressure is mildly elevated with sinus tachycardia  Anemia - Monitor closely, no indication for transfusion, some dilutional effect.  Overall stable with no evidence of GI bleed  Prediabetes P: Continue SSI CBG goal 140-180 CBG checks every 4  Lab Results  Component Value Date   HGBA1C 6.1 (H) 01/31/2024   CBG (last 3)  Recent Labs    02/11/24 0327 02/11/24 0820 02/11/24 1249  GLUCAP 167* 136* 153*          Condition -  Guarded  Family Communication  :  Discussed with wife at bedside daily  Code Status : Full code  Consults  : PCCM, neurosurgery Dr. Michale Age, neurology,  nephrology  PUD Prophylaxis : PPI   Procedures  :     EEG 02/03/2024.  No seizures.  CT head 02/04/2024.  Noted discussed with Dr. Michale Age neurosurgery.  No interventions needed.    1. Largely resolved left side but Increased Right Side Subdural Hematoma since 01/31/2024. Right SDH now up to 11 mm with increased leftward midline shift, (now 4-5 mm) and increased mass effect on the right lateral ventricle. 2. Small volume SAH now apparent. Stable small volume IVH. No acute ventriculomegaly. Possible small superimposed right posterior Emma sphere hemorrhagic contusion. 3. No skull fracture identified.   CT 5/9 -  1. Slightly increased size of right larger than left subdural hematomas. Unchanged mass effect and 3 mm of leftward midline shift. 2. Small volume subarachnoid hemorrhage with mildly increased hemorrhage in the left lateral ventricle, possibly redistribution.  CT 5/8 -   1. Acute 9 mm thick right cerebral convexity subdural hemorrhage which extends along the right tentorial leaflet. Resulting 3 mm leftward midline shift. 2. Scattered small volume of acute subarachnoid hemorrhage. 3. No evidence of acute fracture or traumatic malalignment in the cervical spine.  TTE - 1. Left ventricular ejection fraction, by estimation, is 60 to 65% . The left ventricle has normal function. The left ventricle has no regional wall motion abnormalities. Left ventricular diastolic parameters are consistent with Grade I diastolic dysfunction ( impaired relaxation) . 2. Right ventricular systolic function is normal. The right ventricular size is normal. 3. The mitral valve is normal in structure. No evidence of mitral valve regurgitation. No evidence of mitral stenosis. 4. The aortic valve was not well visualized. Aortic valve regurgitation is mild. No aortic stenosis is present. 5. The inferior vena cava is normal in size with greater than 50% respiratory variability, suggesting right atrial pressure of  3 mmHg.      Disposition Plan  :    Status is: Inpatient  DVT Prophylaxis  :    Place and maintain sequential compression device Start: 02/05/24 0657 SCDs Start: 01/30/24 2330   Lab Results  Component Value Date   PLT 310 02/11/2024    Diet :  Diet Order             DIET DYS 2 Room service appropriate? Yes with Assist; Fluid consistency: Thin  Diet effective now                    Inpatient Medications  Scheduled Meds:  ALPRAZolam   0.5 mg Oral BID   arformoterol   15 mcg Nebulization BID   budesonide  (PULMICORT ) nebulizer solution  0.25 mg Nebulization BID   Chlorhexidine  Gluconate Cloth  6 each Topical Daily   feeding supplement  237 mL Oral BID BM   feeding supplement (OSMOLITE 1.5 CAL)  660 mL Per Tube Q24H   feeding supplement (PROSource TF20)  60 mL Per Tube BID   insulin  aspart  0-9 Units Subcutaneous Q4H   lacosamide   200 mg Per NG tube BID   levothyroxine   150 mcg Oral Q0600   metoprolol  tartrate  25 mg Oral BID   multivitamin with minerals  1 tablet Oral Daily   mouth rinse  15 mL Mouth Rinse 4 times per day   pantoprazole  (PROTONIX ) IV  40 mg Intravenous QHS   phenobarbital   64.8 mg Per Tube BID   revefenacin   175 mcg Nebulization Daily   sodium chloride  flush  10-40 mL Intracatheter Q12H   sodium chloride   1 g Oral BID   Continuous Infusions:  sodium chloride  75 mL/hr at 02/11/24 0600   PRN Meds:.acetaminophen , docusate sodium , haloperidol  lactate, hydrALAZINE , hydrALAZINE , labetalol ,  polyethylene glycol, sodium chloride  flush, traMADol   Antibiotics  :    Anti-infectives (From admission, onward)    Start     Dose/Rate Route Frequency Ordered Stop   02/04/24 1030  cefTRIAXone  (ROCEPHIN ) 1 g in sodium chloride  0.9 % 100 mL IVPB        1 g 200 mL/hr over 30 Minutes Intravenous Every 24 hours 02/04/24 0937 02/08/24 1257         Objective:   Vitals:   02/11/24 0400 02/11/24 0600 02/11/24 0818 02/11/24 1250  BP: 124/63 128/62 136/71  120/70  Pulse: 96 (!) 106    Resp: 16 17 17 18   Temp:   98.5 F (36.9 C) 99.9 F (37.7 C)  TempSrc:   Oral Oral  SpO2: 97% 96% 100% 94%  Weight:      Height:        Wt Readings from Last 3 Encounters:  02/11/24 87.1 kg  09/06/23 94.8 kg  07/11/22 103.4 kg     Intake/Output Summary (Last 24 hours) at 02/11/2024 1520 Last data filed at 02/11/2024 0600 Gross per 24 hour  Intake 3465.85 ml  Output 2100 ml  Net 1365.85 ml     Physical Exam  Awake and alert this morning, interactive in no apparent distress, to respond sometimes, left periorbital bruising from fall improving , Is having right upper lip twitching/tics, as discussed with him and wife at bedside this has been ongoing for years . Symmetrical Chest wall movement, Good air movement bilaterally, CTAB RRR,No Gallops,Rubs or new Murmurs, No Parasternal Heave +ve B.Sounds, Abd Soft, No tenderness, No rebound - guarding or rigidity. No Cyanosis, Clubbing or edema, bilateral feet bruising.   RN pressure injury documentation: Pressure Injury 01/31/24 Coccyx Mid Deep Tissue Pressure Injury - Purple or maroon localized area of discolored intact skin or blood-filled blister due to damage of underlying soft tissue from pressure and/or shear. (Active)  01/31/24 0100  Location: Coccyx  Location Orientation: Mid  Staging: Deep Tissue Pressure Injury - Purple or maroon localized area of discolored intact skin or blood-filled blister due to damage of underlying soft tissue from pressure and/or shear.  Wound Description (Comments):   Present on Admission: Yes  Dressing Type Foam - Lift dressing to assess site every shift 02/10/24 2000      Data Review:    Recent Labs  Lab 02/05/24 0432 02/06/24 0443 02/07/24 0436 02/08/24 0258 02/09/24 1227 02/10/24 0253 02/11/24 0325  WBC 14.6* 10.6* 10.3 11.2* 11.1* 12.6* 11.1*  HGB 10.5* 9.6* 8.7* 8.4* 7.9* 8.4* 8.1*  HCT 29.9* 28.2* 25.5* 24.3* 23.3* 24.9* 24.1*  PLT 195 189 239  247 268 293 310  MCV 87.9 91.0 92.4 92.7 94.0 93.6 93.8  MCH 30.9 31.0 31.5 32.1 31.9 31.6 31.5  MCHC 35.1 34.0 34.1 34.6 33.9 33.7 33.6  RDW 15.9* 16.3* 16.6* 16.8* 16.9* 16.6* 17.2*  LYMPHSABS 3.2 2.9 2.6 2.6  --   --   --   MONOABS 2.1* 1.4* 1.4* 1.9*  --   --   --   EOSABS 0.0 0.0 0.0 0.1  --   --   --   BASOSABS 0.0 0.0 0.0 0.1  --   --   --     Recent Labs  Lab 02/05/24 0432 02/05/24 0857 02/06/24 0443 02/07/24 0436 02/08/24 0258 02/09/24 1227 02/09/24 1322 02/10/24 0253 02/11/24 0325  NA 131*  --  132* 133* 131* 128*  --  131* 131*  K 3.8  --  3.6 3.4*  3.9 3.7  --  3.8 3.8  CL 97*  --  100 104 100 99  --  99 101  CO2 23  --  21* 22 24 24   --  23 23  ANIONGAP 11  --  11 7 7 5   --  9 7  GLUCOSE 151*  --  125* 167* 158* 152*  --  146* 167*  BUN 21  --  22 21 24* 21  --  20 18  CREATININE 0.65  --  0.68 0.57* 0.62 0.52*  --  0.51* 0.55*  AST 50*  --  31 38 30  --   --   --   --   ALT 40  --  34 31 28  --   --   --   --   ALKPHOS 53  --  54 53 52  --   --   --   --   BILITOT 0.7  --  0.6 0.7 0.6  --   --   --   --   ALBUMIN 3.4*  --  3.0* 2.7* 2.8*  --   --   --   --   CRP 1.9*  --  1.9* 3.0*  --   --   --   --   --   PROCALCITON 0.10  --  0.24 0.14 <0.10  --  <0.10 <0.10  --   INR  --  1.0  --   --   --   --   --   --   --   BNP  --   --   --   --   --   --  20.9 46.0  --   MG 2.0  --  1.9 1.9  --  1.9  --  1.9 1.8  PHOS 3.5  --  3.4 2.5  --   --   --  3.5 3.2  CALCIUM  9.1  --  8.4* 8.1* 8.3* 8.4*  --  8.7* 8.4*      Recent Labs  Lab 02/05/24 0432 02/05/24 0857 02/06/24 0443 02/07/24 0436 02/08/24 0258 02/09/24 1227 02/09/24 1322 02/10/24 0253 02/11/24 0325  CRP 1.9*  --  1.9* 3.0*  --   --   --   --   --   PROCALCITON 0.10  --  0.24 0.14 <0.10  --  <0.10 <0.10  --   INR  --  1.0  --   --   --   --   --   --   --   BNP  --   --   --   --   --   --  20.9 46.0  --   MG 2.0  --  1.9 1.9  --  1.9  --  1.9 1.8  CALCIUM  9.1  --  8.4* 8.1* 8.3* 8.4*  --  8.7*  8.4*    --------------------------------------------------------------------------------------------------------------- No results found for: "CHOL", "HDL", "LDLCALC", "LDLDIRECT", "TRIG", "CHOLHDL"  Lab Results  Component Value Date   HGBA1C 6.1 (H) 01/31/2024   No results for input(s): "TSH", "T4TOTAL", "FREET4", "T3FREE", "THYROIDAB" in the last 72 hours.     Micro Results Recent Results (from the past 240 hours)  Urine Culture     Status: Abnormal   Collection Time: 02/03/24  8:22 AM   Specimen: Urine, Random  Result Value Ref Range Status   Specimen Description URINE, RANDOM  Final   Special Requests   Final    NONE  Reflexed from [id number] Performed at [facility], [address], [place], [place] [address]    Culture >=100,000 COLONIES/mL ESCHERICHIA COLI (A)  Final   Report Status 02/05/2024 FINAL  Final   Organism ID, Bacteria ESCHERICHIA COLI (A)  Final      Susceptibility   Escherichia coli - MIC*    AMPICILLIN <=2 SENSITIVE Sensitive     CEFAZOLIN <=4 SENSITIVE Sensitive     CEFEPIME <=0.12 SENSITIVE Sensitive     CEFTRIAXONE  <=0.25 SENSITIVE Sensitive     CIPROFLOXACIN  <=0.25 SENSITIVE Sensitive     GENTAMICIN <=1 SENSITIVE Sensitive     IMIPENEM <=0.25 SENSITIVE Sensitive     NITROFURANTOIN <=16 SENSITIVE Sensitive     TRIMETH /SULFA  <=20 SENSITIVE Sensitive     AMPICILLIN/SULBACTAM <=2 SENSITIVE Sensitive     PIP/TAZO <=4 SENSITIVE Sensitive ug/mL    * >=100,000 COLONIES/mL ESCHERICHIA COLI  Culture, blood (Routine X 2) w Reflex to ID Panel     Status: None (Preliminary result)   Collection Time: 02/09/24  2:51 PM   Specimen: BLOOD LEFT HAND  Result Value Ref Range Status   Specimen Description BLOOD LEFT HAND  Final   Special Requests   Final    BOTTLES DRAWN AEROBIC AND ANAEROBIC Blood Culture adequate volume   Culture   Final    NO GROWTH 2 DAYS Performed at [facility], [address] [place], [place] [address]    Report Status PENDING   Incomplete  Culture, blood (Routine X 2) w Reflex to ID Panel     Status: None (Preliminary result)   Collection Time: 02/09/24  2:51 PM   Specimen: BLOOD LEFT ARM  Result Value Ref Range Status   Specimen Description BLOOD LEFT ARM  Final   Special Requests   Final    BOTTLES DRAWN AEROBIC AND ANAEROBIC Blood Culture adequate volume   Culture   Final    NO GROWTH 2 DAYS Performed at [facility], [address] [place], [place] [address]    Report Status PENDING  Incomplete    Radiology Report DG Swallowing Func-Speech Pathology Result Date: 02/10/2024 Table formatting from the original result was not included. Modified Barium Swallow Study Patient Details Name: [name] MRN: [id number] Date of Birth: 1961/01/25 Today's Date: 02/10/2024 HPI/PMH: HPI: Pt is a 63 y.o. male who was brought to ED by EMS from home after a fall. CT head (01/30/23) revealed "Slightly increased size of right larger than left subdural hematomas. Small volume subarachnoid hemorrhage with mildly increased hemorrhage in the left lateral ventricle. Failed yale 02/01/24. PMH: Seizure disorder, COPD; ST f/u for po readiness in acute setting; pt currently has TF for nutritive/hydration purposes. Clinical Impression: Clinical Impression: Pt presents with functional airway protection but oral dysphagia, which is suspected to be secondary to cognitive factors. His positioning was suboptimal as his shoulders were drawn up, prohibiting a complete view of the trachea. Trace penetration was observed with thin liquids but was expelled as complete laryngeal elevation occurred (PAS 2, considered WFL). Of note, there was frequent coughing and throat clearance throughout the study in the absence of airway invasion. Ultimately, there was no significant residue but pt had increased difficulty with initiating mastication when presented solids. He initially declined to try solids so question effort. Attempted to present the 13 mm barium  tablet with thin liquids but pt expectorated it before swallowing and it was not reattempted. Suspect his level of alertness may fluctuate given administration of antiepileptics  which have been observed to cause lethargy but feel he could start a diet if given full supervision. Since observation of mastication was limited, recommend starting with Dys 2 solids and thin liquids. Recommend pills whole with puree or via Cortrak. SLP will continue following but do not anticipate post-acute needs for swallowing. Factors that may increase risk of adverse event in presence of aspiration Roderick Civatte & Jessy [place] 2021): Factors that may increase risk of adverse event in presence of aspiration Roderick Civatte & Jessy [place] 2021): Reduced cognitive function; Limited mobility; Dependence for feeding and/or oral hygiene; Presence of tubes (ETT, trach, NG, etc.) Recommendations/Plan: Swallowing Evaluation Recommendations Swallowing Evaluation Recommendations Recommendations: PO diet PO Diet Recommendation: Dysphagia 2 (Finely chopped); Thin liquids (Level 0) Liquid Administration via: Cup; Straw Medication Administration: Whole meds with puree Supervision: Full supervision/cueing for swallowing strategies; Staff to assist with self-feeding Swallowing strategies  : Minimize environmental distractions; Slow rate Postural changes: Position pt fully upright for meals; Stay upright 30-60 min after meals Oral care recommendations: Oral care BID (2x/day) Treatment Plan Treatment Plan Treatment recommendations: Therapy as outlined in treatment plan below Follow-up recommendations: Skilled nursing-short term rehab (<3 hours/day) Functional status assessment: Patient has had a recent decline in their functional status and demonstrates the ability to make significant improvements in function in a reasonable and predictable amount of time. Treatment frequency: Min 2x/week Treatment duration: 1 week Interventions: Compensatory techniques; Patient/family  education; Trials of upgraded texture/liquids Recommendations Recommendations for follow up therapy are one component of a multi-disciplinary discharge planning process, led by the attending physician.  Recommendations may be updated based on patient status, additional functional criteria and insurance authorization. Assessment: Orofacial Exam: Orofacial Exam Oral Cavity: Oral Hygiene: WFL Oral Cavity - Dentition: Edentulous; Dentures, not available Orofacial Anatomy: WFL Oral Motor/Sensory Function: WFL Anatomy: Anatomy: Suspected cervical osteophytes Boluses Administered: Boluses Administered Boluses Administered: Thin liquids (Level 0); Mildly thick liquids (Level 2, nectar thick); Moderately thick liquids (Level 3, honey thick); Puree; Solid  Oral Impairment Domain: Oral Impairment Domain Lip Closure: Interlabial escape, no progression to anterior lip Tongue control during bolus hold: Not tested Bolus preparation/mastication: Timely and efficient chewing and mashing Bolus transport/lingual motion: Brisk tongue motion Oral residue: Complete oral clearance Location of oral residue : N/A Initiation of pharyngeal swallow : Valleculae  Pharyngeal Impairment Domain: Pharyngeal Impairment Domain Soft palate elevation: No bolus between soft palate (SP)/pharyngeal wall (PW) Laryngeal elevation: Complete superior movement of thyroid  cartilage with complete approximation of arytenoids to epiglottic petiole Anterior hyoid excursion: Complete anterior movement Epiglottic movement: Complete inversion Laryngeal vestibule closure: Complete, no air/contrast in laryngeal vestibule Pharyngeal stripping wave : Present - complete Pharyngeal contraction (A/P view only): N/A Pharyngoesophageal segment opening: Complete distension and complete duration, no obstruction of flow Tongue base retraction: No contrast between tongue base and posterior pharyngeal wall (PPW) Pharyngeal residue: Complete pharyngeal clearance Location of  pharyngeal residue: N/A  Esophageal Impairment Domain: No data recorded Pill: Pill Consistency administered: Thin liquids (Level 0) Thin liquids (Level 0): Impaired (see clinical impressions) Penetration/Aspiration Scale Score: Penetration/Aspiration Scale Score 1.  Material does not enter airway: Mildly thick liquids (Level 2, nectar thick); Moderately thick liquids (Level 3, honey thick); Puree; Solid 2.  Material enters airway, remains ABOVE vocal cords then ejected out: Thin liquids (Level 0) Compensatory Strategies: Compensatory Strategies Compensatory strategies: No   General Information: Caregiver present: No  Diet Prior to this Study: NPO; Cortrak/Small bore NG tube   Temperature : Normal   Respiratory Status: WFL   Supplemental O2:  None (Room air)   History of Recent Intubation: No  Behavior/Cognition: Alert; Cooperative; Requires cueing Self-Feeding Abilities: Dependent for feeding Baseline vocal quality/speech: Normal Volitional Cough: Able to elicit Volitional Swallow: Able to elicit Exam Limitations: Poor participation; Poor positioning Goal Planning: Prognosis for improved oropharyngeal function: Fair Barriers to Reach Goals: Cognitive deficits No data recorded Patient/Family Stated Goal: to have PO intake and Cortrak removed Consulted and agree with results and recommendations: Patient Pain: Pain Assessment Pain Assessment: Faces Pain Score: 10 Faces Pain Scale: 0 Pain Location: "all over" Pain Descriptors / Indicators: Aching Pain Intervention(s): Monitored during session End of Session: Start Time:SLP Start Time (ACUTE ONLY): 1345 Stop Time: SLP Stop Time (ACUTE ONLY): 1406 Time Calculation:SLP Time Calculation (min) (ACUTE ONLY): 21 min Charges: SLP Evaluations $ SLP Speech Visit: 1 Visit SLP Evaluations $MBS Swallow: 1 Procedure $Swallowing Treatment: 1 Procedure SLP visit diagnosis: SLP Visit Diagnosis: Dysphagia, oropharyngeal phase (R13.12) Past Medical History: Past Medical History: Diagnosis  Date  Anemia   Depression   Diabetes mellitus without complication (HCC)   Emphysema of lung (HCC)   GERD (gastroesophageal reflux disease)   Hiatal hernia   Hyperlipidemia   Hypertension   Memory loss   Seizures (HCC)   Thyroid  disease  Past Surgical History: Past Surgical History: Procedure Laterality Date  APPENDECTOMY    DENTAL SURGERY    EYE SURGERY    TONSILLECTOMY AND ADENOIDECTOMY   Amil Kale, M.A., CCC-SLP Speech Language Pathology, Acute Rehabilitation Services Secure Chat preferred [phone] 02/10/2024, 3:17 PM    .rad Signature  Kelleen Patee Antanisha Mohs M.D on 02/11/2024 at 3:20 PM   -  To page go to [email or web address]

## 2024-02-12 ENCOUNTER — Inpatient Hospital Stay (HOSPITAL_COMMUNITY): Payer: Medicare (Managed Care)

## 2024-02-12 DIAGNOSIS — D649 Anemia, unspecified: Secondary | ICD-10-CM

## 2024-02-12 DIAGNOSIS — S065XAA Traumatic subdural hemorrhage with loss of consciousness status unknown, initial encounter: Secondary | ICD-10-CM | POA: Diagnosis not present

## 2024-02-12 DIAGNOSIS — Z79899 Other long term (current) drug therapy: Secondary | ICD-10-CM | POA: Diagnosis not present

## 2024-02-12 DIAGNOSIS — E035 Myxedema coma: Secondary | ICD-10-CM | POA: Diagnosis not present

## 2024-02-12 LAB — URINALYSIS, ROUTINE W REFLEX MICROSCOPIC
Bilirubin Urine: NEGATIVE
Glucose, UA: 50 mg/dL — AB
Hgb urine dipstick: NEGATIVE
Ketones, ur: NEGATIVE mg/dL
Leukocytes,Ua: NEGATIVE
Nitrite: NEGATIVE
Protein, ur: NEGATIVE mg/dL
Specific Gravity, Urine: 1.018 (ref 1.005–1.030)
pH: 5 (ref 5.0–8.0)

## 2024-02-12 LAB — BASIC METABOLIC PANEL WITH GFR
Anion gap: 8 (ref 5–15)
BUN: 20 mg/dL (ref 8–23)
CO2: 22 mmol/L (ref 22–32)
Calcium: 8.6 mg/dL — ABNORMAL LOW (ref 8.9–10.3)
Chloride: 102 mmol/L (ref 98–111)
Creatinine, Ser: 0.64 mg/dL (ref 0.61–1.24)
GFR, Estimated: >60 mL/min (ref >60)
Glucose, Bld: 126 mg/dL — ABNORMAL HIGH (ref 70–99)
Potassium: 3.8 mmol/L (ref 3.5–5.1)
Sodium: 132 mmol/L — ABNORMAL LOW (ref 135–145)

## 2024-02-12 LAB — GLUCOSE, CAPILLARY
Glucose-Capillary: 121 mg/dL — ABNORMAL HIGH (ref 70–99)
Glucose-Capillary: 124 mg/dL — ABNORMAL HIGH (ref 70–99)
Glucose-Capillary: 126 mg/dL — ABNORMAL HIGH (ref 70–99)
Glucose-Capillary: 144 mg/dL — ABNORMAL HIGH (ref 70–99)
Glucose-Capillary: 147 mg/dL — ABNORMAL HIGH (ref 70–99)
Glucose-Capillary: 189 mg/dL — ABNORMAL HIGH (ref 70–99)

## 2024-02-12 LAB — CBC
HCT: 25.6 % — ABNORMAL LOW (ref 39.0–52.0)
Hemoglobin: 8.5 g/dL — ABNORMAL LOW (ref 13.0–17.0)
MCH: 31.7 pg (ref 26.0–34.0)
MCHC: 33.2 g/dL (ref 30.0–36.0)
MCV: 95.5 fL (ref 80.0–100.0)
Platelets: 315 10*3/uL (ref 150–400)
RBC: 2.68 MIL/uL — ABNORMAL LOW (ref 4.22–5.81)
RDW: 18 % — ABNORMAL HIGH (ref 11.5–15.5)
WBC: 11.1 10*3/uL — ABNORMAL HIGH (ref 4.0–10.5)
nRBC: 1.9 % — ABNORMAL HIGH (ref 0.0–0.2)

## 2024-02-12 LAB — PHOSPHORUS: Phosphorus: 3.8 mg/dL (ref 2.5–4.6)

## 2024-02-12 LAB — PROCALCITONIN: Procalcitonin: <0.1 ng/mL

## 2024-02-12 LAB — MAGNESIUM: Magnesium: 1.9 mg/dL (ref 1.7–2.4)

## 2024-02-12 MED ORDER — ACETAMINOPHEN 325 MG PO TABS
650.0000 mg | ORAL_TABLET | Freq: Four times a day (QID) | ORAL | Status: AC | PRN
  Administered 2024-02-13: 650 mg via ORAL
  Filled 2024-02-12: qty 2

## 2024-02-12 MED ORDER — PANTOPRAZOLE SODIUM 40 MG PO TBEC
40.0000 mg | DELAYED_RELEASE_TABLET | Freq: Every day | ORAL | Status: DC
  Administered 2024-02-12 – 2024-02-18 (×7): 40 mg via ORAL
  Filled 2024-02-12 (×7): qty 1

## 2024-02-12 MED ORDER — PHENOBARBITAL 32.4 MG PO TABS
64.8000 mg | ORAL_TABLET | Freq: Two times a day (BID) | ORAL | Status: DC
  Administered 2024-02-12 – 2024-02-18 (×13): 64.8 mg via ORAL
  Filled 2024-02-12 (×14): qty 2

## 2024-02-12 MED ORDER — LACOSAMIDE 200 MG PO TABS
200.0000 mg | ORAL_TABLET | Freq: Two times a day (BID) | ORAL | Status: DC
  Administered 2024-02-12 – 2024-02-18 (×13): 200 mg via ORAL
  Filled 2024-02-12 (×11): qty 4
  Filled 2024-02-12: qty 1
  Filled 2024-02-12 (×2): qty 4

## 2024-02-12 NOTE — [place] FL2 (Signed)
 [place]  MEDICAID FL2 LEVEL OF CARE FORM     IDENTIFICATION  Patient Name: [name] Birthdate: November 05, 1960 Sex: male Admission Date (Current Location): 01/30/2024  [facility] and [place] Number:  Producer, television/film/video and Address:  [facility] [place], [address], [place], [place] [address]      Provider Number: [id number]  Attending Physician Name and Address:  Burton Casey, MD  Relative Name and Phone Number:  Kacen Mellinger; Spouse; [phone]    Current Level of Care: Hospital Recommended Level of Care: Skilled Nursing Facility Prior Approval Number:    Date Approved/Denied:   PASRR Number: [id number] F expires 05/12/2024  Discharge Plan: SNF    Current Diagnoses: Patient Active Problem List   Diagnosis Date Noted   Subdural hematoma (HCC) 01/30/2024   Toxic metabolic encephalopathy 01/24/2016   Pressure ulcer 01/23/2016   Non-insulin  dependent type 2 diabetes mellitus (HCC)    Altered mental status 01/22/2016   COPD (chronic obstructive pulmonary disease) (HCC) 01/22/2016   Thrombocytopenia (HCC) 01/22/2016   Acute encephalopathy 11/01/2015   Chronic daily headache 03/08/2015   Localization-related symptomatic 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, intractable, without status epilepticus (HCC) 03/07/2015   Memory loss 03/07/2015   Seizure disorder (HCC) 03/05/2014   Other and unspecified hyperlipidemia 03/05/2014   Essential hypertension, benign 03/05/2014   Diabetes (HCC) 03/05/2014   Depression 03/05/2014   Generalized anxiety disorder 03/05/2014   Hypothyroidism 03/05/2014   Poor dentition 03/05/2014    Orientation RESPIRATION BLADDER Height & Weight     Self, Situation, Place  Normal (Room Air) Incontinent, External catheter Weight: 197 lb 1.5 oz (89.4 kg) Height:  5\' 6"  (167.6 cm)  BEHAVIORAL SYMPTOMS/MOOD NEUROLOGICAL BOWEL NUTRITION STATUS    Convulsions/Seizures (Seizure Disorder (HCC))  Incontinent Diet (Please see discharge summary)  AMBULATORY STATUS COMMUNICATION OF NEEDS Skin   Extensive Assist Verbally PU Stage and Appropriate Care (Pressure Injury 01/31/24 Coccyx Mid Deep Tissue Pressure Injury - Purple or maroon localized area of discolored intact skin or blood-filled blister due to damage of underlying soft tissue from pressure and/or shear.)                       Personal Care Assistance Level of Assistance  Bathing, Feeding, Dressing Bathing Assistance: Maximum assistance Feeding assistance: Maximum assistance Dressing Assistance: Maximum assistance     Functional Limitations Info  Sight Sight Info: Impaired (L (Edema))        SPECIAL CARE FACTORS FREQUENCY  PT (By licensed PT), OT (By licensed OT)     PT Frequency: 5x OT Frequency: 5x            Contractures Contractures Info: Not present    Additional Factors Info  Code Status, Allergies, Insulin  Sliding Scale Code Status Info: Full Code Allergies Info: Levetiracetam ; Tegretol (carbamazepine); Penicillins; Topiramate   Insulin  Sliding Scale Info: Please see discharge summary       Current Medications (02/12/2024):  This is the current hospital active medication list Current Facility-Administered Medications  Medication Dose Route Frequency Provider Last Rate Last Admin   acetaminophen  (TYLENOL ) tablet 650 mg  650 mg Oral Q6H PRN Reome, Earle J, RPH       ALPRAZolam  (XANAX ) tablet 0.5 mg  0.5 mg Oral BID Young Hensen, RPH   0.5 mg at 02/12/24 0955   arformoterol  (BROVANA ) nebulizer solution 15 mcg  15 mcg Nebulization BID Star East, PA-C   15 mcg at 02/12/24 0735  budesonide  (PULMICORT ) nebulizer solution 0.25 mg  0.25 mg Nebulization BID Shelli Dexter M, PA-C   0.25 mg at 02/12/24 2130   Chlorhexidine  Gluconate Cloth 2 % PADS 6 each  6 each Topical Daily Lind Repine, MD   6 each at 02/12/24 8657   docusate sodium  (COLACE) capsule 100 mg  100 mg Oral BID PRN Star East, PA-C       feeding supplement (ENSURE ENLIVE / ENSURE PLUS) liquid 237 mL  237 mL Oral BID BM Elgergawy, Dawood S, MD   237 mL at 02/12/24 8469   haloperidol  lactate (HALDOL ) injection 2 mg  2 mg Intramuscular Q6H PRN Singh, Prashant K, MD       hydrALAZINE  (APRESOLINE ) injection 10 mg  10 mg Intravenous Q4H PRN Ogan, Okoronkwo U, MD   10 mg at 02/06/24 0801   hydrALAZINE  (APRESOLINE ) injection 5 mg  5 mg Intravenous Q4H PRN Elgergawy, Dawood S, MD   5 mg at 02/07/24 0818   insulin  aspart (novoLOG ) injection 0-9 Units  0-9 Units Subcutaneous Q4H Star East, PA-C   1 Units at 02/12/24 6295   labetalol  (NORMODYNE ) injection 10 mg  10 mg Intravenous Q2H PRN Stacy Eagle, MD   10 mg at 02/01/24 2047   lacosamide  (VIMPAT ) tablet 200 mg  200 mg Oral BID Reome, Earle J, RPH   200 mg at 02/12/24 2841   levothyroxine  (SYNTHROID ) tablet 150 mcg  150 mcg Oral Q0600 Young Hensen, RPH   150 mcg at 02/12/24 0533   metoprolol  tartrate (LOPRESSOR ) tablet 25 mg  25 mg Oral BID Elgergawy, Dawood S, MD   25 mg at 02/12/24 0956   multivitamin with minerals tablet 1 tablet  1 tablet Oral Daily Young Hensen, [facility]   1 tablet at 02/12/24 0955   Oral care mouth rinse  15 mL Mouth Rinse 4 times per day Chand, Sudham, MD   15 mL at 02/12/24 0800   pantoprazole  (PROTONIX ) EC tablet 40 mg  40 mg Oral Daily Reome, Earle J, RPH   40 mg at 02/12/24 0955   PHENobarbital  (LUMINAL) tablet 64.8 mg  64.8 mg Oral BID Reome, Earle J, RPH   64.8 mg at 02/12/24 0955   polyethylene glycol (MIRALAX  / GLYCOLAX ) packet 17 g  17 g Oral Daily PRN Shelli Dexter M, PA-C       revefenacin  (YUPELRI ) nebulizer solution 175 mcg  175 mcg Nebulization Daily Shelli Dexter M, PA-C   175 mcg at 02/12/24 0735   sodium chloride  flush (NS) 0.9 % injection 10-40 mL  10-40 mL Intracatheter Q12H Elgergawy, Ardia Kraft, MD   10 mL at 02/12/24 0957   sodium chloride  flush (NS) 0.9 % injection 10-40 mL  10-40 mL Intracatheter PRN  Elgergawy, Ardia Kraft, MD       sodium chloride  tablet 1 g  1 g Oral BID Young Hensen, RPH   1 g at 02/12/24 3244   traMADol  (ULTRAM ) tablet 50 mg  50 mg Oral Q6H PRN Young Hensen, [facility]         Discharge Medications: Please see discharge summary for a list of discharge medications.  Relevant Imaging Results:  Relevant Lab Results:   Additional Information SS# [id number]  Jannice Mends, LCSW

## 2024-02-12 NOTE — Plan of Care (Signed)
   Problem: Nutritional: Goal: Maintenance of adequate nutrition will improve Outcome: Progressing

## 2024-02-12 NOTE — Progress Notes (Signed)
 PROGRESS NOTE        PATIENT DETAILS Name: [name] Age: 63 y.o. Sex: male Date of Birth: Mar 07, 1961 Admit Date: 01/30/2024 Admitting Physician Lind Repine, MD ZOX:[id number], Moises Ang, MD  Brief Summary: Patient is a 63 y.o.  male with history of COPD, DM-2, hypothyroidism, anxiety disorder-presented with a fall-found to have subdural hematoma and seizures.  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  Scattered SAH. 5/8>> CT C-spine: No fracture/traumatic malalignment 5/9>> CT head: Slight increased size of SDH.  Small volume SAH. 5/13>> CT head: Resolved left SDH, increased right SDH-11 mm.  Small volume SAH.  Significant microbiology data: 5/8>> COVID/influenza/RSV PCR: Negative 5/8>> blood culture: Negative 5/12>> urine culture: E. coli 5/18>> 10 blood cultures: No growth  Procedures: None  Consults: PCCM Neurology Neurosurgery  Subjective: Lying comfortably in bed-denies any chest pain or shortness of breath.  Denies any abdominal pain.  No diarrhea.  Fever yesterday evening.  Objective: Vitals: Blood pressure 130/63, pulse 90, temperature 99 F (37.2 C), temperature source Oral, resp. rate 18, height 5\' 6"  (1.676 m), weight 89.4 kg, SpO2 100%.   Exam: Gen Exam:Alert awake-not in any distress HEENT:atraumatic, normocephalic Chest: B/L clear to auscultation anteriorly CVS:S1S2 regular Abdomen:soft non tender, non distended Extremities:no edema Neurology: Non focal Skin: no rash  Pertinent Labs/Radiology:    Latest Ref Rng & Units 02/12/2024    2:43 AM 02/11/2024    3:25 AM 02/10/2024    2:53 AM  CBC  WBC 4.0 - 10.5 K/uL 11.1  11.1  12.6   Hemoglobin 13.0 - 17.0 g/dL 8.5  8.1  8.4    Hematocrit 39.0 - 52.0 % 25.6  24.1  24.9   Platelets 150 - 400 K/uL 315  310  293     Lab Results  Component Value Date   NA 132 (L) 02/12/2024   K 3.8 02/12/2024   CL 102 02/12/2024   CO2 22 02/12/2024      Assessment/Plan: Traumatic SDH/SAH Evaluated by neurosurgery-conservative/non operative management recommended  Acute toxic metabolic encephalopathy Multifactorial etiology-SDH/seizures/AEDs Overall much improved-awake/alert this morning  Seizures Likely provoked from SDH. No further seizures LTM EEG negative Continue phenobarbital  and Vimpat . Needs patient follow-up with neurology.  Complicated E. coli UTI S/p Rocephin  x 5 days-last dose on 5/17 However febrile again yesterday-see below  Fever Febrile yesterday afternoon No source apparent on exam Check UA/chest x-ray/blood culture Given stability/obvious lack of foci of infection-monitor off antibiotics.  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  COPD Not in exacerbation Continue bronchodilators  Tobacco abuse Counseled  Nutrition Status: Nutrition Problem: Increased nutrient needs Etiology: chronic illness (COPD) Signs/Symptoms: estimated needs Interventions: Ensure Enlive (each supplement provides 350kcal and 20 grams of protein), MVI    Pressure Ulcer: Pressure Injury 01/31/24 Coccyx Mid Deep Tissue Pressure Injury - Purple or maroon localized area of discolored intact skin or blood-filled blister due to damage of underlying soft tissue from pressure and/or shear. (Active)  01/31/24 0100  Location: Coccyx  Location Orientation: Mid  Staging: Deep Tissue Pressure Injury - Purple or maroon localized area of discolored intact skin or blood-filled blister due to damage of underlying soft tissue from pressure and/or shear.  Wound  Description (Comments):   Present on Admission: Yes  Dressing Type Foam - Lift dressing to assess site every shift 02/11/24 2100   Class 1 obesity: Estimated body mass index is 31.81 kg/m as calculated from the following:   Height as of this encounter: 5\' 6"  (1.676 m).   Weight as of this encounter: 89.4 kg.   Code status:   Code Status: Full Code   DVT Prophylaxis: Place and maintain sequential compression device Start: 02/05/24 0657 SCDs Start: 01/30/24 2330   Family Communication: None at bedside   Disposition Plan: Status is: Inpatient Remains inpatient appropriate because: Severity of illness   Planned Discharge Destination:Skilled nursing facility   Diet: Diet Order             DIET DYS 2 Room service appropriate? Yes with Assist; Fluid consistency: Thin  Diet effective now                     Antimicrobial agents: Anti-infectives (From admission, onward)    Start     Dose/Rate Route Frequency Ordered Stop   02/04/24 1030  cefTRIAXone  (ROCEPHIN ) 1 g in sodium chloride  0.9 % 100 mL IVPB        1 g 200 mL/hr over 30 Minutes Intravenous Every 24 hours 02/04/24 0937 02/08/24 1257        MEDICATIONS: Scheduled Meds:  ALPRAZolam   0.5 mg Oral BID   arformoterol   15 mcg Nebulization BID   budesonide  (PULMICORT ) nebulizer solution  0.25 mg Nebulization BID   Chlorhexidine  Gluconate Cloth  6 each Topical Daily   feeding supplement  237 mL Oral BID BM   insulin  aspart  0-9 Units Subcutaneous Q4H   lacosamide   200 mg Oral BID   levothyroxine   150 mcg Oral Q0600   metoprolol  tartrate  25 mg Oral BID   multivitamin with minerals  1 tablet Oral Daily   mouth rinse  15 mL Mouth Rinse 4 times per day   pantoprazole   40 mg Oral Daily   phenobarbital   64.8 mg Oral BID   revefenacin   175 mcg Nebulization Daily   sodium chloride  flush  10-40 mL Intracatheter Q12H   sodium chloride   1 g Oral BID   Continuous Infusions:  sodium chloride  50 mL/hr at 02/11/24  1828   PRN Meds:.acetaminophen , docusate sodium , haloperidol  lactate, hydrALAZINE , hydrALAZINE , labetalol , polyethylene glycol, sodium chloride  flush, traMADol    I have personally reviewed following labs and imaging studies  LABORATORY DATA: CBC: Recent Labs  Lab 02/06/24 0443 02/07/24 0436 02/08/24 0258 02/09/24 1227 02/10/24 0253 02/11/24 0325 02/12/24 0243  WBC 10.6* 10.3 11.2* 11.1* 12.6* 11.1* 11.1*  NEUTROABS 6.2 6.1 6.5  --   --   --   --   HGB 9.6* 8.7* 8.4* 7.9* 8.4* 8.1* 8.5*  HCT 28.2* 25.5* 24.3* 23.3* 24.9* 24.1* 25.6*  MCV 91.0 92.4 92.7 94.0 93.6 93.8 95.5  PLT 189 239 247 268 293 310 315    Basic Metabolic Panel: Recent Labs  Lab 02/06/24 0443 02/07/24 0436 02/08/24 0258 02/09/24 1227 02/10/24 0253 02/11/24 0325 02/12/24 0243  NA 132* 133* 131* 128* 131* 131* 132*  K 3.6 3.4* 3.9 3.7 3.8 3.8 3.8  CL 100 104 100 99 99 101 102  CO2 21* 22 24 24 23 23 22   GLUCOSE  125* 167* 158* 152* 146* 167* 126*  BUN 22 21 24* 21 20 18 20   CREATININE 0.68 0.57* 0.62 0.52* 0.51* 0.55* 0.64  CALCIUM  8.4* 8.1* 8.3* 8.4* 8.7* 8.4* 8.6*  MG 1.9 1.9  --  1.9 1.9 1.8 1.9  PHOS 3.4 2.5  --   --  3.5 3.2 3.8    GFR: Estimated Creatinine Clearance: 96.4 mL/min (by C-G formula based on SCr of 0.64 mg/dL).  Liver Function Tests: Recent Labs  Lab 02/06/24 0443 02/07/24 0436 02/08/24 0258  AST 31 38 30  ALT 34 31 28  ALKPHOS 54 53 52  BILITOT 0.6 0.7 0.6  PROT 6.4* 6.3* 6.2*  ALBUMIN 3.0* 2.7* 2.8*   No results for input(s): "LIPASE", "AMYLASE" in the last 168 hours. No results for input(s): "AMMONIA" in the last 168 hours.  Coagulation Profile: No results for input(s): "INR", "PROTIME" in the last 168 hours.  Cardiac Enzymes: No results for input(s): "CKTOTAL", "CKMB", "CKMBINDEX", "TROPONINI" in the last 168 hours.  BNP (last 3 results) No results for input(s): "PROBNP" in the last 8760 hours.  Lipid Profile: No results for input(s): "CHOL", "HDL",  "LDLCALC", "TRIG", "CHOLHDL", "LDLDIRECT" in the last 72 hours.  Thyroid  Function Tests: No results for input(s): "TSH", "T4TOTAL", "FREET4", "T3FREE", "THYROIDAB" in the last 72 hours.  Anemia Panel: No results for input(s): "VITAMINB12", "FOLATE", "FERRITIN", "TIBC", "IRON", "RETICCTPCT" in the last 72 hours.  Urine analysis:    Component Value Date/Time   COLORURINE AMBER (A) 02/04/2024 1147   APPEARANCEUR HAZY (A) 02/04/2024 1147   LABSPEC 1.017 02/04/2024 1147   PHURINE 7.0 02/04/2024 1147   GLUCOSEU >=500 (A) 02/04/2024 1147   HGBUR MODERATE (A) 02/04/2024 1147   BILIRUBINUR NEGATIVE 02/04/2024 1147   KETONESUR 5 (A) 02/04/2024 1147   PROTEINUR 100 (A) 02/04/2024 1147   UROBILINOGEN 0.2 03/03/2014 1750   NITRITE NEGATIVE 02/04/2024 1147   LEUKOCYTESUR LARGE (A) 02/04/2024 1147    Sepsis Labs: Lactic Acid, Venous    Component Value Date/Time   LATICACIDVEN 2.1 (HH) 01/30/2024 2020    MICROBIOLOGY: Recent Results (from the past 240 hours)  Urine Culture     Status: Abnormal   Collection Time: 02/03/24  8:22 AM   Specimen: Urine, Random  Result Value Ref Range Status   Specimen Description URINE, RANDOM  Final   Special Requests   Final    NONE Reflexed from [id number] Performed at [facility], [address] [place], [place] [address]    Culture >=100,000 COLONIES/mL ESCHERICHIA COLI (A)  Final   Report Status 02/05/2024 FINAL  Final   Organism ID, Bacteria ESCHERICHIA COLI (A)  Final      Susceptibility   Escherichia coli - MIC*    AMPICILLIN <=2 SENSITIVE Sensitive     CEFAZOLIN <=4 SENSITIVE Sensitive     CEFEPIME <=0.12 SENSITIVE Sensitive     CEFTRIAXONE  <=0.25 SENSITIVE Sensitive     CIPROFLOXACIN  <=0.25 SENSITIVE Sensitive     GENTAMICIN <=1 SENSITIVE Sensitive     IMIPENEM <=0.25 SENSITIVE Sensitive     NITROFURANTOIN <=16 SENSITIVE Sensitive     TRIMETH /SULFA  <=20 SENSITIVE Sensitive     AMPICILLIN/SULBACTAM <=2 SENSITIVE Sensitive      PIP/TAZO <=4 SENSITIVE Sensitive ug/mL    * >=100,000 COLONIES/mL ESCHERICHIA COLI  Culture, blood (Routine X 2) w Reflex to ID Panel     Status: None (Preliminary result)   Collection Time: 02/09/24  2:51 PM   Specimen: BLOOD LEFT HAND  Result Value  Ref Range Status   Specimen Description BLOOD LEFT HAND  Final   Special Requests   Final    BOTTLES DRAWN AEROBIC AND ANAEROBIC Blood Culture adequate volume   Culture   Final    NO GROWTH 3 DAYS Performed at [facility], [address], [place], [place] [address]    Report Status PENDING  Incomplete  Culture, blood (Routine X 2) w Reflex to ID Panel     Status: None (Preliminary result)   Collection Time: 02/09/24  2:51 PM   Specimen: BLOOD LEFT ARM  Result Value Ref Range Status   Specimen Description BLOOD LEFT ARM  Final   Special Requests   Final    BOTTLES DRAWN AEROBIC AND ANAEROBIC Blood Culture adequate volume   Culture   Final    NO GROWTH 3 DAYS Performed at [facility], [address], [place], [place] [address]    Report Status PENDING  Incomplete    RADIOLOGY STUDIES/RESULTS: DG Swallowing Func-Speech Pathology Result Date: 02/10/2024 Table formatting from the original result was not included. Modified Barium Swallow Study Patient Details Name: [name] MRN: [id number] Date of Birth: October 05, 1960 Today's Date: 02/10/2024 HPI/PMH: HPI: Pt is a 63 y.o. male who was brought to ED by EMS from home after a fall. CT head (01/30/23) revealed "Slightly increased size of right larger than left subdural hematomas. Small volume subarachnoid hemorrhage with mildly increased hemorrhage in the left lateral ventricle. Failed yale 02/01/24. PMH: Seizure disorder, COPD; ST f/u for po readiness in acute setting; pt currently has TF for nutritive/hydration purposes. Clinical Impression: Clinical Impression: Pt presents with functional airway protection but oral dysphagia, which is suspected to be secondary to cognitive factors. His  positioning was suboptimal as his shoulders were drawn up, prohibiting a complete view of the trachea. Trace penetration was observed with thin liquids but was expelled as complete laryngeal elevation occurred (PAS 2, considered WFL). Of note, there was frequent coughing and throat clearance throughout the study in the absence of airway invasion. Ultimately, there was no significant residue but pt had increased difficulty with initiating mastication when presented solids. He initially declined to try solids so question effort. Attempted to present the 13 mm barium tablet with thin liquids but pt expectorated it before swallowing and it was not reattempted. Suspect his level of alertness may fluctuate given administration of antiepileptics which have been observed to cause lethargy but feel he could start a diet if given full supervision. Since observation of mastication was limited, recommend starting with Dys 2 solids and thin liquids. Recommend pills whole with puree or via Cortrak. SLP will continue following but do not anticipate post-acute needs for swallowing. Factors that may increase risk of adverse event in presence of aspiration Roderick Civatte & Jessy Morocco 2021): Factors that may increase risk of adverse event in presence of aspiration Roderick Civatte & Jessy Morocco 2021): Reduced cognitive function; Limited mobility; Dependence for feeding and/or oral hygiene; Presence of tubes (ETT, trach, NG, etc.) Recommendations/Plan: Swallowing Evaluation Recommendations Swallowing Evaluation Recommendations Recommendations: PO diet PO Diet Recommendation: Dysphagia 2 (Finely chopped); Thin liquids (Level 0) Liquid Administration via: Cup; Straw Medication Administration: Whole meds with puree Supervision: Full supervision/cueing for swallowing strategies; Staff to assist with self-feeding Swallowing strategies  : Minimize environmental distractions; Slow rate Postural changes: Position pt fully upright for meals; Stay upright 30-60 min  after meals Oral care recommendations: Oral care BID (2x/day) Treatment Plan Treatment Plan Treatment recommendations: Therapy as outlined in treatment plan below  Follow-up recommendations: Skilled nursing-short term rehab (<3 hours/day) Functional status assessment: Patient has had a recent decline in their functional status and demonstrates the ability to make significant improvements in function in a reasonable and predictable amount of time. Treatment frequency: Min 2x/week Treatment duration: 1 week Interventions: Compensatory techniques; Patient/family education; Trials of upgraded texture/liquids Recommendations Recommendations for follow up therapy are one component of a multi-disciplinary discharge planning process, led by the attending physician.  Recommendations may be updated based on patient status, additional functional criteria and insurance authorization. Assessment: Orofacial Exam: Orofacial Exam Oral Cavity: Oral Hygiene: WFL Oral Cavity - Dentition: Edentulous; Dentures, not available Orofacial Anatomy: WFL Oral Motor/Sensory Function: WFL Anatomy: Anatomy: Suspected cervical osteophytes Boluses Administered: Boluses Administered Boluses Administered: Thin liquids (Level 0); Mildly thick liquids (Level 2, nectar thick); Moderately thick liquids (Level 3, honey thick); Puree; Solid  Oral Impairment Domain: Oral Impairment Domain Lip Closure: Interlabial escape, no progression to anterior lip Tongue control during bolus hold: Not tested Bolus preparation/mastication: Timely and efficient chewing and mashing Bolus transport/lingual motion: Brisk tongue motion Oral residue: Complete oral clearance Location of oral residue : N/A Initiation of pharyngeal swallow : Valleculae  Pharyngeal Impairment Domain: Pharyngeal Impairment Domain Soft palate elevation: No bolus between soft palate (SP)/pharyngeal wall (PW) Laryngeal elevation: Complete superior movement of thyroid  cartilage with complete  approximation of arytenoids to epiglottic petiole Anterior hyoid excursion: Complete anterior movement Epiglottic movement: Complete inversion Laryngeal vestibule closure: Complete, no air/contrast in laryngeal vestibule Pharyngeal stripping wave : Present - complete Pharyngeal contraction (A/P view only): N/A Pharyngoesophageal segment opening: Complete distension and complete duration, no obstruction of flow Tongue base retraction: No contrast between tongue base and posterior pharyngeal wall (PPW) Pharyngeal residue: Complete pharyngeal clearance Location of pharyngeal residue: N/A  Esophageal Impairment Domain: No data recorded Pill: Pill Consistency administered: Thin liquids (Level 0) Thin liquids (Level 0): Impaired (see clinical impressions) Penetration/Aspiration Scale Score: Penetration/Aspiration Scale Score 1.  Material does not enter airway: Mildly thick liquids (Level 2, nectar thick); Moderately thick liquids (Level 3, honey thick); Puree; Solid 2.  Material enters airway, remains ABOVE vocal cords then ejected out: Thin liquids (Level 0) Compensatory Strategies: Compensatory Strategies Compensatory strategies: No   General Information: Caregiver present: No  Diet Prior to this Study: NPO; Cortrak/Small bore NG tube   Temperature : Normal   Respiratory Status: WFL   Supplemental O2: None (Room air)   History of Recent Intubation: No  Behavior/Cognition: Alert; Cooperative; Requires cueing Self-Feeding Abilities: Dependent for feeding Baseline vocal quality/speech: Normal Volitional Cough: Able to elicit Volitional Swallow: Able to elicit Exam Limitations: Poor participation; Poor positioning Goal Planning: Prognosis for improved oropharyngeal function: Fair Barriers to Reach Goals: Cognitive deficits No data recorded Patient/Family Stated Goal: to have PO intake and Cortrak removed Consulted and agree with results and recommendations: Patient Pain: Pain Assessment Pain Assessment: Faces Pain Score: 10  Faces Pain Scale: 0 Pain Location: "all over" Pain Descriptors / Indicators: Aching Pain Intervention(s): Monitored during session End of Session: Start Time:SLP Start Time (ACUTE ONLY): 1345 Stop Time: SLP Stop Time (ACUTE ONLY): 1406 Time Calculation:SLP Time Calculation (min) (ACUTE ONLY): 21 min Charges: SLP Evaluations $ SLP Speech Visit: 1 Visit SLP Evaluations $MBS Swallow: 1 Procedure $Swallowing Treatment: 1 Procedure SLP visit diagnosis: SLP Visit Diagnosis: Dysphagia, oropharyngeal phase (R13.12) Past Medical History: Past Medical History: Diagnosis Date  Anemia   Depression   Diabetes mellitus without complication (HCC)   Emphysema of lung (HCC)   GERD (gastroesophageal  reflux disease)   Hiatal hernia   Hyperlipidemia   Hypertension   Memory loss   Seizures (HCC)   Thyroid  disease  Past Surgical History: Past Surgical History: Procedure Laterality Date  APPENDECTOMY    DENTAL SURGERY    EYE SURGERY    TONSILLECTOMY AND ADENOIDECTOMY   Amil Kale, M.A., CCC-SLP Speech Language Pathology, Acute Rehabilitation Services Secure Chat preferred [phone] 02/10/2024, 3:17 PM    LOS: 13 days   Kimberly Penna, MD  [facility]    To contact the attending provider between 7A-7P or the covering provider during after hours 7P-7A, please log into the web site [email or web address] and access using universal [place] password for that web site. If you do not have the password, please call the hospital operator.  02/12/2024, 10:27 AM

## 2024-02-12 NOTE — Progress Notes (Signed)
 Occupational Therapy Treatment Patient Details Name: [name] MRN: [id number] DOB: 11/19/1960 Today's Date: 02/12/2024   History of present illness Pt is a 63 y.o. male presenting 5/8 after a fall at home. UDS positive for benzodiazepines. EKG with sinus bradycardia. Head CT 5/8 showed 9 mm right subdural hematoma with 3 mm midline shift and small volume acute SAH.  Pt on continuous EEG 5/14 with continued seizures. 5/13 CT head: Resolved left SDH, increased right SDH-11 mm. Small volume SAH. PMH significant for seizures and COPD, smoker.   OT comments  Patient received in supine and agreeable to OT/PT treatment with encouragement. Patient requiring +2 total assistance to get to EOB and total assistance with sitting balance due to posterior leaning.  Visual scanning and command following addressed with limited progress due to patient keeping eyes closed most of session. Patient had urinary incontinence and performed rolling in bed with total assist for linen change. Patient will benefit from continued inpatient follow up therapy, <3 hours/day. Acute OT to continue to follow to address established goals to facilitate DC to next venue of care.        If plan is discharge home, recommend the following:  Two people to help with walking and/or transfers;Two people to help with bathing/dressing/bathroom;Assist for transportation;Help with stairs or ramp for entrance;Direct supervision/assist for medications management;Direct supervision/assist for financial management;Assistance with cooking/housework;Assistance with feeding   Equipment Recommendations  Other (comment) (TBD)    Recommendations for Other Services      Precautions / Restrictions Precautions Precautions: Fall Recall of Precautions/Restrictions: Impaired Restrictions Weight Bearing Restrictions Per Provider Order: No       Mobility Bed Mobility Overal bed mobility: Needs Assistance Bed Mobility: Rolling, Sidelying to Sit,  Sit to Sidelying Rolling: Used rails, Total assist Sidelying to sit: HOB elevated, Used rails, Max assist, +2 for physical assistance     Sit to sidelying: Total assist, +2 for physical assistance General bed mobility comments: patient requiring total assist fpr rp;;omg amd tp get to EOB with posterior leaning    Transfers                   General transfer comment: not attempted     Balance Overall balance assessment: Needs assistance Sitting-balance support: Feet supported, Single extremity supported Sitting balance-Leahy Scale: Zero Sitting balance - Comments: posterior lean, minimal righting reactions Postural control: Posterior lean                                 ADL either performed or assessed with clinical judgement   ADL Overall ADL's : Needs assistance/impaired                                       General ADL Comments: focused on bed mobility, sitting balance,and following commands    Extremity/Trunk Assessment Upper Extremity Assessment Upper Extremity Assessment: Overall WFL for tasks assessed LUE: Shoulder pain with ROM            Vision       Perception     Praxis     Communication Communication Communication: No apparent difficulties   Cognition Arousal: Lethargic Behavior During Therapy: Flat affect Cognition: Cognition impaired   Orientation impairments: Place, Time, Situation Awareness: Intellectual awareness impaired, Online awareness impaired Memory impairment (select all impairments): Short-term memory, Working [employer] fast streamer, Conservation officer, historic buildings  Attention impairment (select first level of impairment): Sustained attention Executive functioning impairment (select all impairments): Sequencing, Reasoning, Problem solving OT - Cognition Comments: kept eyes closed for most of session with frequent cues to keep open. Often repeated questions and phrases                 Following commands:  Impaired Following commands impaired: Follows one step commands with increased time, Follows one step commands inconsistently      Cueing   Cueing Techniques: Verbal cues, Tactile cues, Gestural cues  Exercises      Shoulder Instructions       General Comments patient performed rolling in bed to allow for linen change due to urinary incontinence    Pertinent Vitals/ Pain       Pain Assessment Pain Assessment: Faces Faces Pain Scale: Hurts even more Pain Location: "all over", per spouse he has chronic LUE shoulder and arm pain from prior injury many years ago; notable bruising to L hip and knee Pain Descriptors / Indicators: Aching, Grimacing, Guarding Pain Intervention(s): Limited activity within patient's tolerance, Monitored during session, Repositioned  Home Living                                          Prior Functioning/Environment              Frequency  Min 1X/week        Progress Toward Goals  OT Goals(current goals can now be found in the care plan section)  Progress towards OT goals: Progressing toward goals  Acute Rehab OT Goals OT Goal Formulation: Patient unable to participate in goal setting Time For Goal Achievement: 02/15/24 Potential to Achieve Goals: Fair ADL Goals Pt Will Perform Grooming: with set-up;sitting Pt Will Perform Upper Body Bathing: with set-up;sitting Pt Will Perform Lower Body Bathing: with min assist;sit to/from stand Pt Will Transfer to Toilet: with min assist;ambulating Additional ADL Goal #1: Pt will follow one step commands without cues for sequencing or problem solving.  Plan      Co-evaluation    PT/OT/SLP Co-Evaluation/Treatment: Yes Reason for Co-Treatment: Complexity of the patient's impairments (multi-system involvement);Necessary to address cognition/behavior during functional activity;For patient/therapist safety;To address functional/ADL transfers PT goals addressed during session:  Mobility/safety with mobility;Balance OT goals addressed during session: ADL's and self-care      AM-PAC OT "6 Clicks" Daily Activity     Outcome Measure   Help from another person eating meals?: Total Help from another person taking care of personal grooming?: A Lot Help from another person toileting, which includes using toliet, bedpan, or urinal?: Total Help from another person bathing (including washing, rinsing, drying)?: A Lot Help from another person to put on and taking off regular upper body clothing?: A Lot Help from another person to put on and taking off regular lower body clothing?: Total 6 Click Score: 9    End of Session    OT Visit Diagnosis: Unsteadiness on feet (R26.81);Muscle weakness (generalized) (M62.81);Other symptoms and signs involving cognitive function;Low vision, both eyes (H54.2)   Activity Tolerance Patient limited by lethargy   Patient Left in bed;with call bell/phone within reach;with bed alarm set;with family/visitor present   Nurse Communication Mobility status        Time: 1610-9604 OT Time Calculation (min): 35 min  Charges: OT General Charges $OT Visit: 1 Visit OT Treatments $Therapeutic Activity: 8-22 mins  Sharin David  Alva Jewels, OTA Acute Rehabilitation Services  Office [phone]   Jovita Nipper 02/12/2024, 3:07 PM

## 2024-02-12 NOTE — TOC Progression Note (Addendum)
 Transition of Care [facility]) - Progression Note    Patient Details  Name: [name] MRN: [id number] Date of Birth: Nov 06, 1960  Transition of Care [facility]) CM/SW Contact  Jannice Mends, LCSW Phone Number: 02/12/2024, 10:51 AM  Clinical Narrative:    CSW requested [name] begin insurance process. Will check pasrr status.   Pasrr received and placed on Fl2.   Expected Discharge Plan: Skilled Nursing Facility Barriers to Discharge: Awaiting State Approval (PASRR), Continued Medical Work up, SNF Pending bed offer  Expected Discharge Plan and Services In-house Referral: Clinical Social Work   Post Acute Care Choice: Skilled Nursing Facility Living arrangements for the past 2 months: Single Family Home                                       Social Determinants of Health (SDOH) Interventions SDOH Screenings   Food Insecurity: Patient Unable To Answer (02/02/2024)  Housing: Patient Unable To Answer (02/02/2024)  Transportation Needs: Patient Unable To Answer (02/02/2024)  Utilities: Patient Unable To Answer (02/02/2024)  Financial Resource Strain: Low Risk  (12/31/2022)   Received from [facility]  Physical Activity: Unknown (03/27/2023)   Received from [facility]  Social Connections: Somewhat Isolated (03/27/2023)   Received from [facility]  Stress: Stress Concern Present (03/27/2023)   Received from [facility]  Tobacco Use: High Risk (01/30/2024)    Readmission Risk Interventions     No data to display

## 2024-02-12 NOTE — Plan of Care (Signed)

## 2024-02-12 NOTE — Progress Notes (Signed)
 Physical Therapy Treatment Patient Details Name: [name] MRN: [id number] DOB: 07-04-1961 Today's Date: 02/12/2024   History of Present Illness Pt is a 63 y.o. male presenting 5/8 after a fall at home. UDS positive for benzodiazepines. EKG with sinus bradycardia. Head CT 5/8 showed 9 mm right subdural hematoma with 3 mm midline shift and small volume acute SAH.  Pt on continuous EEG 5/14 with continued seizures. 5/13 CT head: Resolved left SDH, increased right SDH-11 mm. Small volume SAH. PMH significant for seizures and COPD, smoker.    PT Comments  Pt received in supine, agreeable to therapy session with encouragement and spouse arriving during session and appreciative. Pt needing increased assist, +2 totalA to perform log roll to and from L EOB. Pt with increased fatigue/lethargy after sitting EOB ~5 mins with poor seated balance needing posterior support to maintain sitting throughout and urinary incontinence observed, so pt returned to supine for bed linen change and NT arrived to replace primofit external catheter. Patient will benefit from continued inpatient follow up therapy, <3 hours/day    If plan is discharge home, recommend the following: Two people to help with walking and/or transfers;Two people to help with bathing/dressing/bathroom   Can travel by private vehicle     No  Equipment Recommendations  Other (comment) (defer to next venue)    Recommendations for Other Services       Precautions / Restrictions Precautions Precautions: Fall Recall of Precautions/Restrictions: Impaired Restrictions Weight Bearing Restrictions Per Provider Order: No     Mobility  Bed Mobility Overal bed mobility: Needs Assistance Bed Mobility: Rolling, Sidelying to Sit, Sit to Sidelying Rolling: Used rails, Total assist Sidelying to sit: HOB elevated, Used rails, Max assist, +2 for physical assistance     Sit to sidelying: Total assist, +2 for physical assistance General bed  mobility comments: Total A assist to roll and rise, supporting trunk and maneuvering LEs off bed. Pt needs hand over hand assist to remove grip from side rail when attempting to push his trunk upright. TotalA +2 returning to supine due to fatigue/lethargy.    Transfers                   General transfer comment: Unable to stabilize on edge of bed to attempt standing and bed wet, pt more drowsy needing to return to supine for linen change.    Ambulation/Gait                   Stairs             Wheelchair Mobility     Tilt Bed    Modified Rankin (Stroke Patients Only) Modified Rankin (Stroke Patients Only) Pre-Morbid Rankin Score: Moderate disability Modified Rankin: Severe disability     Balance Overall balance assessment: Needs assistance Sitting-balance support: Feet supported, Single extremity supported Sitting balance-Leahy Scale: Zero Sitting balance - Comments: posterior lean, minimal righting reactions Postural control: Posterior lean                                  Communication Communication Communication: No apparent difficulties  Cognition Arousal: Lethargic Behavior During Therapy: Flat affect   PT - Cognitive impairments: Difficult to assess, Orientation, Awareness, Memory, Sequencing, Problem solving, Safety/Judgement, Attention, Initiation Difficult to assess due to: Impaired communication Orientation impairments: Time, Situation, Place  PT - Cognition Comments: Oriented to self (name), pt frequently keeping eyes closed, pt able to identify "2" fingers held up to midline visual area, but when therapist holding up one finger toward his R/peripheral field, pt states "I don't see well" and not able to state. Per spouse he does have baseline visual deficit/glaucoma. Pt states "yes" when prompted for symptoms with postural changes, but unclear if this is correct. Following commands: Impaired Following  commands impaired: Follows one step commands with increased time, Follows one step commands inconsistently    Cueing Cueing Techniques: Verbal cues, Tactile cues, Gestural cues  Exercises General Exercises - Lower Extremity Ankle Circles/Pumps: AROM, Both, Supine, 10 reps, AAROM Heel Slides: AAROM, Both, 10 reps, Supine    General Comments General comments (skin integrity, edema, etc.): urinary incontinence, NT arrived to place primofit during session and linen changed on mattress      Pertinent Vitals/Pain Pain Assessment Pain Assessment: Faces Faces Pain Scale: Hurts even more Pain Location: "all over", per spouse he has chronic LUE shoulder and arm pain from prior injury many years ago; notable bruising to L hip and knee Pain Descriptors / Indicators: Aching, Grimacing, Guarding Pain Intervention(s): Limited activity within patient's tolerance, Monitored during session, Repositioned    Home Living                          Prior Function            PT Goals (current goals can now be found in the care plan section) Acute Rehab PT Goals Patient Stated Goal: none stated PT Goal Formulation: Patient unable to participate in goal setting Time For Goal Achievement: 02/21/24 Potential to Achieve Goals: Fair Progress towards PT goals: Progressing toward goals (slowly)    Frequency    Min 2X/week      PT Plan      Co-evaluation PT/OT/SLP Co-Evaluation/Treatment: Yes Reason for Co-Treatment: Complexity of the patient's impairments (multi-system involvement);Necessary to address cognition/behavior during functional activity;For patient/therapist safety;To address functional/ADL transfers PT goals addressed during session: Mobility/safety with mobility;Balance        AM-PAC PT "6 Clicks" Mobility   Outcome Measure  Help needed turning from your back to your side while in a flat bed without using bedrails?: Total Help needed moving from lying on your back to  sitting on the side of a flat bed without using bedrails?: Total Help needed moving to and from a bed to a chair (including a wheelchair)?: Total Help needed standing up from a chair using your arms (e.g., wheelchair or bedside chair)?: Total Help needed to walk in hospital room?: Total Help needed climbing 3-5 steps with a railing? : Total 6 Click Score: 6    End of Session   Activity Tolerance: Patient limited by fatigue;Patient limited by lethargy;Patient limited by pain Patient left: in bed;with call bell/phone within reach;with bed alarm set;Other (comment);with family/visitor present (heels floated; bed in chair posture so spouse can feed him) Nurse Communication: Mobility status;Need for lift equipment PT Visit Diagnosis: Unsteadiness on feet (R26.81);Muscle weakness (generalized) (M62.81);History of falling (Z91.81);Other abnormalities of gait and mobility (R26.89);Difficulty in walking, not elsewhere classified (R26.2);Other symptoms and signs involving the nervous system (R29.898)     Time: 2440-1027 PT Time Calculation (min) (ACUTE ONLY): 35 min  Charges:    $Therapeutic Activity: 8-22 mins PT General Charges $$ ACUTE PT VISIT: 1 Visit  Llana Rile., PTA Acute Rehabilitation Services Secure Chat Preferred 9a-5:30pm Office: [phone]    [facility] 02/12/2024, 2:05 PM

## 2024-02-13 ENCOUNTER — Inpatient Hospital Stay (HOSPITAL_COMMUNITY): Payer: Medicare (Managed Care)

## 2024-02-13 DIAGNOSIS — D649 Anemia, unspecified: Secondary | ICD-10-CM | POA: Diagnosis not present

## 2024-02-13 DIAGNOSIS — Z79899 Other long term (current) drug therapy: Secondary | ICD-10-CM | POA: Diagnosis not present

## 2024-02-13 DIAGNOSIS — E035 Myxedema coma: Secondary | ICD-10-CM | POA: Diagnosis not present

## 2024-02-13 DIAGNOSIS — S065XAA Traumatic subdural hemorrhage with loss of consciousness status unknown, initial encounter: Secondary | ICD-10-CM | POA: Diagnosis not present

## 2024-02-13 LAB — GLUCOSE, CAPILLARY
Glucose-Capillary: 126 mg/dL — ABNORMAL HIGH (ref 70–99)
Glucose-Capillary: 123 mg/dL — ABNORMAL HIGH (ref 70–99)
Glucose-Capillary: 144 mg/dL — ABNORMAL HIGH (ref 70–99)
Glucose-Capillary: 117 mg/dL — ABNORMAL HIGH (ref 70–99)
Glucose-Capillary: 135 mg/dL — ABNORMAL HIGH (ref 70–99)
Glucose-Capillary: 129 mg/dL — ABNORMAL HIGH (ref 70–99)

## 2024-02-13 LAB — BASIC METABOLIC PANEL WITH GFR
Anion gap: 7 (ref 5–15)
BUN: 20 mg/dL (ref 8–23)
CO2: 22 mmol/L (ref 22–32)
Calcium: 8.5 mg/dL — ABNORMAL LOW (ref 8.9–10.3)
Chloride: 104 mmol/L (ref 98–111)
Creatinine, Ser: 0.6 mg/dL — ABNORMAL LOW (ref 0.61–1.24)
GFR, Estimated: >60 mL/min (ref >60)
Glucose, Bld: 127 mg/dL — ABNORMAL HIGH (ref 70–99)
Potassium: 3.6 mmol/L (ref 3.5–5.1)
Sodium: 133 mmol/L — ABNORMAL LOW (ref 135–145)

## 2024-02-13 LAB — PHOSPHORUS: Phosphorus: 3.6 mg/dL (ref 2.5–4.6)

## 2024-02-13 LAB — CBC
HCT: 23.8 % — ABNORMAL LOW (ref 39.0–52.0)
Hemoglobin: 7.9 g/dL — ABNORMAL LOW (ref 13.0–17.0)
MCH: 31.9 pg (ref 26.0–34.0)
MCHC: 33.2 g/dL (ref 30.0–36.0)
MCV: 96 fL (ref 80.0–100.0)
Platelets: 282 10*3/uL (ref 150–400)
RBC: 2.48 MIL/uL — ABNORMAL LOW (ref 4.22–5.81)
RDW: 18.6 % — ABNORMAL HIGH (ref 11.5–15.5)
WBC: 9.7 10*3/uL (ref 4.0–10.5)
nRBC: 0.9 % — ABNORMAL HIGH (ref 0.0–0.2)

## 2024-02-13 LAB — MAGNESIUM: Magnesium: 1.8 mg/dL (ref 1.7–2.4)

## 2024-02-13 MED ORDER — ONDANSETRON HCL 4 MG/2ML IJ SOLN
4.0000 mg | Freq: Four times a day (QID) | INTRAMUSCULAR | Status: DC | PRN
  Administered 2024-02-13 – 2024-02-24 (×4): 4 mg via INTRAVENOUS
  Filled 2024-02-13 (×3): qty 2

## 2024-02-13 NOTE — Progress Notes (Signed)
 PROGRESS NOTE        PATIENT DETAILS Name: [name] Age: 63 y.o. Sex: male Date of Birth: 10/21/1960 Admit Date: 01/30/2024 Admitting Physician Lind Repine, MD JYN:[id number], Moises Ang, MD  Brief Summary: Patient is a 63 y.o.  male with history of COPD, DM-2, hypothyroidism, anxiety disorder-presented with a fall-found to have subdural hematoma and seizures.  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  Scattered SAH. 5/8>> CT C-spine: No fracture/traumatic malalignment 5/9>> CT head: Slight increased size of SDH.  Small volume SAH. 5/13>> CT head: Resolved left SDH, increased right SDH-11 mm.  Small volume SAH.  Significant microbiology data: 5/8>> COVID/influenza/RSV PCR: Negative 5/8>> blood culture: Negative 5/12>> urine culture: E. coli 5/18>> 10 blood cultures: No growth  Procedures: None  Consults: PCCM Neurology Neurosurgery  Subjective: Afebrile overnight but again had fever last evening.  Some cough  Objective: Vitals: Blood pressure 126/62, pulse 83, temperature 98.9 F (37.2 C), temperature source Oral, resp. rate 16, height 5\' 6"  (1.676 m), weight 84.5 kg, SpO2 97%.   Exam: Awake/alert Not in any distress Chest: Some transmitted upper airway sounds-some bibasilar rales CVS: S1-S2 regular Abdomen: Soft nontender nondistended Extremities: No edema Nonfocal exam  Pertinent Labs/Radiology:    Latest Ref Rng & Units 02/13/2024    2:15 AM 02/12/2024    2:43 AM 02/11/2024    3:25 AM  CBC  WBC 4.0 - 10.5 K/uL 9.7  11.1  11.1   Hemoglobin 13.0 - 17.0 g/dL 7.9  8.5  8.1   Hematocrit 39.0 - 52.0 % 23.8  25.6  24.1   Platelets 150 - 400 K/uL 282  315  310     Lab Results  Component  Value Date   NA 133 (L) 02/13/2024   K 3.6 02/13/2024   CL 104 02/13/2024   CO2 22 02/13/2024      Assessment/Plan: Traumatic SDH/SAH Evaluated by neurosurgery-conservative/non operative management recommended  Acute toxic metabolic encephalopathy Multifactorial etiology-SDH/seizures/AEDs Overall much improved-awake/alert this morning  Seizures Likely provoked from SDH. No further seizures LTM EEG negative Continue phenobarbital  and Vimpat . Needs patient follow-up with neurology.  Complicated E. coli UTI S/p Rocephin  x 5 days-last dose on 5/17 However febrile again yesterday-see below  Fever Fever again yesterday afternoon UA negative for UTI CXR negative for pneumonia Blood cultures negative so far Has some transmitted upper airway sounds-bibasilar rales-?  Aspiration pneumonia-encourage incentive spirometry/out of bed to chair-get CT chest to assess lung parenchyma.  I Continue to monitor off antibiotics given stability.  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  COPD Not in exacerbation Continue bronchodilators  Tobacco abuse Counseled  Nutrition Status: Nutrition Problem: Increased nutrient needs Etiology: chronic illness (COPD) Signs/Symptoms: estimated needs Interventions: Ensure Enlive (each supplement provides 350kcal and 20 grams of protein), MVI    Pressure Ulcer: Pressure Injury 01/31/24 Coccyx Mid Deep Tissue Pressure Injury - Purple or maroon localized area of discolored intact skin or blood-filled blister due to damage of underlying soft tissue from pressure and/or shear. (  Active)  01/31/24 0100  Location: Coccyx  Location Orientation: Mid  Staging: Deep Tissue Pressure Injury - Purple or maroon localized area of discolored intact skin or blood-filled blister due to damage of  underlying soft tissue from pressure and/or shear.  Wound Description (Comments):   Present on Admission: Yes  Dressing Type Foam - Lift dressing to assess site every shift 02/12/24 2100   Class 1 obesity: Estimated body mass index is 30.07 kg/m as calculated from the following:   Height as of this encounter: 5\' 6"  (1.676 m).   Weight as of this encounter: 84.5 kg.   Code status:   Code Status: Full Code   DVT Prophylaxis: Place and maintain sequential compression device Start: 02/05/24 0657 SCDs Start: 01/30/24 2330   Family Communication: Spouse [name]-[phone] -left  VM 5/22   Disposition Plan: Status is: Inpatient Remains inpatient appropriate because: Severity of illness   Planned Discharge Destination:Skilled nursing facility   Diet: Diet Order             DIET DYS 2 Room service appropriate? Yes with Assist; Fluid consistency: Thin  Diet effective now                     Antimicrobial agents: Anti-infectives (From admission, onward)    Start     Dose/Rate Route Frequency Ordered Stop   02/04/24 1030  cefTRIAXone  (ROCEPHIN ) 1 g in sodium chloride  0.9 % 100 mL IVPB        1 g 200 mL/hr over 30 Minutes Intravenous Every 24 hours 02/04/24 0937 02/08/24 1257        MEDICATIONS: Scheduled Meds:  ALPRAZolam   0.5 mg Oral BID   arformoterol   15 mcg Nebulization BID   budesonide  (PULMICORT ) nebulizer solution  0.25 mg Nebulization BID   Chlorhexidine  Gluconate Cloth  6 each Topical Daily   feeding supplement  237 mL Oral BID BM   insulin  aspart  0-9 Units Subcutaneous Q4H   lacosamide   200 mg Oral BID   levothyroxine   150 mcg Oral Q0600   metoprolol  tartrate  25 mg Oral BID   multivitamin with minerals  1 tablet Oral Daily   mouth rinse  15 mL Mouth Rinse 4 times per day   pantoprazole   40 mg Oral Daily   phenobarbital   64.8 mg Oral BID   revefenacin   175 mcg Nebulization Daily   sodium chloride  flush  10-40 mL Intracatheter Q12H   sodium  chloride  1 g Oral BID   Continuous Infusions:   PRN Meds:.docusate sodium , haloperidol  lactate, hydrALAZINE , hydrALAZINE , labetalol , polyethylene glycol, sodium chloride  flush, traMADol    I have personally reviewed following labs and imaging studies  LABORATORY DATA: CBC: Recent Labs  Lab 02/07/24 0436 02/08/24 0258 02/09/24 1227 02/10/24 0253 02/11/24 0325 02/12/24 0243 02/13/24 0215  WBC 10.3 11.2* 11.1* 12.6* 11.1* 11.1* 9.7  NEUTROABS 6.1 6.5  --   --   --   --   --   HGB 8.7* 8.4* 7.9* 8.4* 8.1* 8.5* 7.9*  HCT 25.5* 24.3* 23.3* 24.9* 24.1* 25.6* 23.8*  MCV 92.4 92.7 94.0 93.6 93.8 95.5 96.0  PLT 239 247 268 293 310 315 282    Basic Metabolic Panel: Recent Labs  Lab 02/07/24 0436 02/08/24 0258 02/09/24 1227 02/10/24 0253 02/11/24 0325 02/12/24 0243 02/13/24 0215  NA 133*   < > 128* 131* 131* 132* 133*  K 3.4*   < > 3.7 3.8 3.8 3.8 3.6  CL 104   < >  99 99 101 102 104  CO2 22   < > 24 23 23 22 22   GLUCOSE 167*   < > 152* 146* 167* 126* 127*  BUN 21   < > 21 20 18 20 20   CREATININE 0.57*   < > 0.52* 0.51* 0.55* 0.64 0.60*  CALCIUM  8.1*   < > 8.4* 8.7* 8.4* 8.6* 8.5*  MG 1.9  --  1.9 1.9 1.8 1.9 1.8  PHOS 2.5  --   --  3.5 3.2 3.8 3.6   < > = values in this interval not displayed.    GFR: Estimated Creatinine Clearance: 93.9 mL/min (A) (by C-G formula based on SCr of 0.6 mg/dL (L)).  Liver Function Tests: Recent Labs  Lab 02/07/24 0436 02/08/24 0258  AST 38 30  ALT 31 28  ALKPHOS 53 52  BILITOT 0.7 0.6  PROT 6.3* 6.2*  ALBUMIN 2.7* 2.8*   No results for input(s): "LIPASE", "AMYLASE" in the last 168 hours. No results for input(s): "AMMONIA" in the last 168 hours.  Coagulation Profile: No results for input(s): "INR", "PROTIME" in the last 168 hours.  Cardiac Enzymes: No results for input(s): "CKTOTAL", "CKMB", "CKMBINDEX", "TROPONINI" in the last 168 hours.  BNP (last 3 results) No results for input(s): "PROBNP" in the last 8760  hours.  Lipid Profile: No results for input(s): "CHOL", "HDL", "LDLCALC", "TRIG", "CHOLHDL", "LDLDIRECT" in the last 72 hours.  Thyroid  Function Tests: No results for input(s): "TSH", "T4TOTAL", "FREET4", "T3FREE", "THYROIDAB" in the last 72 hours.  Anemia Panel: No results for input(s): "VITAMINB12", "FOLATE", "FERRITIN", "TIBC", "IRON", "RETICCTPCT" in the last 72 hours.  Urine analysis:    Component Value Date/Time   COLORURINE YELLOW 02/12/2024 1046   APPEARANCEUR CLEAR 02/12/2024 1046   LABSPEC 1.018 02/12/2024 1046   PHURINE 5.0 02/12/2024 1046   GLUCOSEU 50 (A) 02/12/2024 1046   HGBUR NEGATIVE 02/12/2024 1046   BILIRUBINUR NEGATIVE 02/12/2024 1046   KETONESUR NEGATIVE 02/12/2024 1046   PROTEINUR NEGATIVE 02/12/2024 1046   UROBILINOGEN 0.2 03/03/2014 1750   NITRITE NEGATIVE 02/12/2024 1046   LEUKOCYTESUR NEGATIVE 02/12/2024 1046    Sepsis Labs: Lactic Acid, Venous    Component Value Date/Time   LATICACIDVEN 2.1 (HH) 01/30/2024 2020    MICROBIOLOGY: Recent Results (from the past 240 hours)  Culture, blood (Routine X 2) w Reflex to ID Panel     Status: None (Preliminary result)   Collection Time: 02/09/24  2:51 PM   Specimen: BLOOD LEFT HAND  Result Value Ref Range Status   Specimen Description BLOOD LEFT HAND  Final   Special Requests   Final    BOTTLES DRAWN AEROBIC AND ANAEROBIC Blood Culture adequate volume   Culture   Final    NO GROWTH 4 DAYS Performed at [facility], [address] [place], [place] [address]    Report Status PENDING  Incomplete  Culture, blood (Routine X 2) w Reflex to ID Panel     Status: None (Preliminary result)   Collection Time: 02/09/24  2:51 PM   Specimen: BLOOD LEFT ARM  Result Value Ref Range Status   Specimen Description BLOOD LEFT ARM  Final   Special Requests   Final    BOTTLES DRAWN AEROBIC AND ANAEROBIC Blood Culture adequate volume   Culture   Final    NO GROWTH 4 DAYS Performed at [facility],  [address], [place], [place] [address]    Report Status PENDING  Incomplete  Culture, blood (Routine  X 2) w Reflex to ID Panel     Status: None (Preliminary result)   Collection Time: 02/12/24 11:16 AM   Specimen: BLOOD LEFT HAND  Result Value Ref Range Status   Specimen Description BLOOD LEFT HAND  Final   Special Requests   Final    BOTTLES DRAWN AEROBIC AND ANAEROBIC Blood Culture results may not be optimal due to an inadequate volume of blood received in culture bottles   Culture   Final    NO GROWTH < 24 HOURS Performed at [facility], [address], [place], [place] [address]    Report Status PENDING  Incomplete  Culture, blood (Routine X 2) w Reflex to ID Panel     Status: None (Preliminary result)   Collection Time: 02/12/24 11:18 AM   Specimen: BLOOD LEFT HAND  Result Value Ref Range Status   Specimen Description BLOOD LEFT HAND  Final   Special Requests   Final    BOTTLES DRAWN AEROBIC AND ANAEROBIC Blood Culture adequate volume   Culture   Final    NO GROWTH < 24 HOURS Performed at [facility], [address] [place], [place] [address]    Report Status PENDING  Incomplete    RADIOLOGY STUDIES/RESULTS: DG Chest Port 1V same Day Result Date: 02/12/2024 CLINICAL DATA:  Fever. EXAM: PORTABLE CHEST 1 VIEW COMPARISON:  Feb 09, 2024. FINDINGS: The heart size and mediastinal contours are within normal limits. Stable right-sided PICC line. Both lungs are clear. The visualized skeletal structures are unremarkable. IMPRESSION: No active disease. Electronically Signed   By: Rosalene Colon M.D.   On: 02/12/2024 14:55     LOS: 14 days   Kimberly Penna, MD  [facility]    To contact the attending provider between 7A-7P or the covering provider during after hours 7P-7A, please log into the web site [email or web address] and access using universal [place] password for that web site. If you do not have the password, please call the hospital  operator.  02/13/2024, 11:14 AM

## 2024-02-13 NOTE — TOC Progression Note (Signed)
 Transition of Care [facility]) - Progression Note    Patient Details  Name: [name] MRN: [id number] Date of Birth: 1961/09/14  Transition of [facility]) CM/SW Contact  Jannice Mends, LCSW Phone Number: 02/13/2024, 9:26 AM  Clinical Narrative:    Alray Askew has received insurance approval for patient once medically stable.    Expected Discharge Plan: Skilled Nursing Facility Barriers to Discharge: Continued Medical Work up  Expected Discharge Plan and Services In-house Referral: Clinical Social Work   Post Acute Care Choice: Skilled Nursing Facility Living arrangements for the past 2 months: Single Family Home                                       Social Determinants of Health (SDOH) Interventions SDOH Screenings   Food Insecurity: Patient Unable To Answer (02/02/2024)  Housing: Patient Unable To Answer (02/02/2024)  Transportation Needs: Patient Unable To Answer (02/02/2024)  Utilities: Patient Unable To Answer (02/02/2024)  Financial Resource Strain: Low Risk  (12/31/2022)   Received from [facility]  Physical Activity: Unknown (03/27/2023)   Received from [facility]  Social Connections: Somewhat Isolated (03/27/2023)   Received from [facility]  Stress: Stress Concern Present (03/27/2023)   Received from [facility]  Tobacco Use: High Risk (01/30/2024)    Readmission Risk Interventions     No data to display

## 2024-02-13 NOTE — Plan of Care (Signed)
   Problem: Activity: Goal: Risk for activity intolerance will decrease Outcome: Progressing

## 2024-02-13 NOTE — Plan of Care (Signed)

## 2024-02-13 NOTE — Progress Notes (Signed)
 SLP Cancellation Note  Patient Details Name: [name] MRN: [id number] DOB: [birth year]   Cancelled treatment:       Reason Eval/Treat Not Completed: Patient at procedure or test/unavailable (transport arriving to take pt to CT). SLP will f/u as schedule permits.    Amil Kale, M.A., CCC-SLP Speech Language Pathology, Acute Rehabilitation Services  Secure Chat preferred [phone]  02/13/2024, 3:29 PM

## 2024-02-14 ENCOUNTER — Inpatient Hospital Stay (HOSPITAL_COMMUNITY): Payer: Medicare (Managed Care)

## 2024-02-14 DIAGNOSIS — E035 Myxedema coma: Secondary | ICD-10-CM | POA: Diagnosis not present

## 2024-02-14 DIAGNOSIS — Z79899 Other long term (current) drug therapy: Secondary | ICD-10-CM | POA: Diagnosis not present

## 2024-02-14 DIAGNOSIS — D649 Anemia, unspecified: Secondary | ICD-10-CM | POA: Diagnosis not present

## 2024-02-14 DIAGNOSIS — S065XAA Traumatic subdural hemorrhage with loss of consciousness status unknown, initial encounter: Secondary | ICD-10-CM | POA: Diagnosis not present

## 2024-02-14 DIAGNOSIS — T80219A Unspecified infection due to central venous catheter, initial encounter: Secondary | ICD-10-CM

## 2024-02-14 DIAGNOSIS — M7989 Other specified soft tissue disorders: Secondary | ICD-10-CM

## 2024-02-14 DIAGNOSIS — R502 Drug induced fever: Secondary | ICD-10-CM | POA: Diagnosis not present

## 2024-02-14 LAB — RESPIRATORY PANEL BY PCR

## 2024-02-14 LAB — GLUCOSE, CAPILLARY
Glucose-Capillary: 120 mg/dL — ABNORMAL HIGH (ref 70–99)
Glucose-Capillary: 124 mg/dL — ABNORMAL HIGH (ref 70–99)
Glucose-Capillary: 138 mg/dL — ABNORMAL HIGH (ref 70–99)
Glucose-Capillary: 149 mg/dL — ABNORMAL HIGH (ref 70–99)
Glucose-Capillary: 154 mg/dL — ABNORMAL HIGH (ref 70–99)
Glucose-Capillary: 185 mg/dL — ABNORMAL HIGH (ref 70–99)

## 2024-02-14 LAB — CULTURE, BLOOD (ROUTINE X 2)
Culture: NO GROWTH
Culture: NO GROWTH
Special Requests: ADEQUATE
Special Requests: ADEQUATE

## 2024-02-14 NOTE — Consult Note (Signed)
 [facility]    Date of Admission:  01/30/2024     Reason for Consult: fuo    Referring Provider: Kimberly Penna     Lines:  5/14-c right upper ext picc  Abx: 5/13-5/17 ceftriaxone         Assessment: 63 yo male copd, dm2, hypothyroid, anxiety, biba after a witnessed glf a home but had bruises of multiple ages at presentation, found to have small SAH/IVH and bilateral SDH, course complicated by nosocomial fever. Other complications include seizures which antiepileptics started  He does say a few weeks decreased energy/sleeping more and fequent falls  Initial head ct 9mm right sdh with 3mm midline shift and small SAH, negative c-spine ct Repeat head ct 5/13 improved/resolved left sdh but increased right sdh to 11 mm with increased left midline shift   He has a urine culture tested 5/12 in setting leukocytosis and given 5 days ceftriaxone  He then developed fever during the last day of ceftriaxone  (HD#10), and has had daily fever since with normalized leukocytosis. Hemodynamics are within normal limit  Chest ct 5/22 unremarkable Blood profile shows nrbc and at earlier time points monocytosis. He has no chronic leukocytosis.   5/25 bilateral LE duplex u/s negative for dvt  He does have a picc line   Nosocomial fever ddx: Drug fever Too long for cns related process and no worsening of intraparenchymal bleed Chronic process (in setting monocytosis/nrbc) and now exhibiting fever? Visitors acquired viral process? Acalculous cholecysititis less likely  Central line related fever (infection/dvt); some bcx can be negative despite line infection  UA was bland; however he does say ?dysuria (can't clarify) -- if continued fever and negative w/u consider pelvis imaging  Gout per his wife no hx off and exam peripheral joint swelling/redness  Micro: 5/21 bcx negative 5/18 bcx negative 5/12 ucx ecoli 5/09 nasal mrsa pcr screen negative 5/08  bcx negative  Plan: Bilateral duplex upper ext u/s Respiratory viral pcr Cpk level given diffuse myalgia; not particular stiff to consider nms but a possibility (he is getting haldol ) Blood smear/ldh in setting monocytosis/nrbc Lft look for sign of drug fever/acalculous cholecystitis  Remove picc Maintain droplet/contact isolation pending resp viral pcr If negative w/u above consider pelvis imaging/repeat ua look for prostatitis (?unclear if dysuria per his history) Avoid further abx now given stable hemodynamics Discussed with primary team     ------------------------------------------------ Principal Problem:   Subdural hematoma (HCC)    HPI: [name] is a 63 y.o. male copd, dm2, hypothyroid, anxiety, biba after a witnessed glf a home but had bruises of multiple ages at presentation, found to have small SAH/IVH and bilateral SDH, course complicated by nosocomial fever. Other complications include seizures which antiepileptics started  He does say a few weeks decreased energy/sleeping more and fequent falls  Patient observed in icu but transferred to floor  Has had leukocytosis and treated 5 days for uti but then on last day ceftriaxone  (hd #10) developed fever. No abx since  He reports he is painful all over but otherwise poor historian  There are bruises of various ages on his body  He has a picc line  Bilateral lower duplex u/s was negative Chest ct was negative for pna No diarrhea No new rash     Family History  Problem Relation Age of Onset   Stroke Mother    Heart disease Father    Thyroid  disease Father    Thyroid  disease Brother  Social History   Tobacco Use   Smoking status: Every Day    Types: E-cigarettes, Cigarettes   Smokeless tobacco: Never   Tobacco comments:    11/01/2015 "did smoke 3 ppd; he's been vaping for 4 years; no nicotine  for the past year; smoked a cigarette 2 days ago"  Substance Use Topics   Alcohol use: No     Alcohol/week: 0.0 standard drinks of alcohol   Drug use: No    Allergies  Allergen Reactions   Levetiracetam  Other (See Comments)    Severe anxiety, agitation, hyperactivity, very negative disposition   Tegretol [Carbamazepine] Anxiety and Other (See Comments)    Increased seizures/ Induces seizures   Penicillins Other (See Comments)    Reaction not noted   Topiramate Other (See Comments)    Reaction not noted    Review of Systems: ROS All Other ROS was negative, except mentioned above   Past Medical History:  Diagnosis Date   Anemia    Depression    Diabetes mellitus without complication (HCC)    Emphysema of lung (HCC)    GERD (gastroesophageal reflux disease)    Hiatal hernia    Hyperlipidemia    Hypertension    Memory loss    Seizures (HCC)    Thyroid  disease        Scheduled Meds:  ALPRAZolam   0.5 mg Oral BID   arformoterol   15 mcg Nebulization BID   budesonide  (PULMICORT ) nebulizer solution  0.25 mg Nebulization BID   Chlorhexidine  Gluconate Cloth  6 each Topical Daily   feeding supplement  237 mL Oral BID BM   insulin  aspart  0-9 Units Subcutaneous Q4H   lacosamide   200 mg Oral BID   levothyroxine   150 mcg Oral Q0600   metoprolol  tartrate  25 mg Oral BID   multivitamin with minerals  1 tablet Oral Daily   mouth rinse  15 mL Mouth Rinse 4 times per day   pantoprazole   40 mg Oral Daily   phenobarbital   64.8 mg Oral BID   revefenacin   175 mcg Nebulization Daily   sodium chloride  flush  10-40 mL Intracatheter Q12H   sodium chloride   1 g Oral BID   Continuous Infusions: PRN Meds:.docusate sodium , haloperidol  lactate, hydrALAZINE , hydrALAZINE , labetalol , ondansetron  (ZOFRAN ) IV, polyethylene glycol, sodium chloride  flush, traMADol    OBJECTIVE: Blood pressure (!) 151/76, pulse 99, temperature 98.2 F (36.8 C), temperature source Oral, resp. rate 19, height 5\' 6"  (1.676 m), weight 84.5 kg, SpO2 94%.  Physical Exam  General/constitutional: patient  sleepy; arousable and answer a few questions but drifted back to sleep HEENT: Normocephalic, PER, Conj Clear, EOMI Neck supple CV: rrr no mrg Lungs: clear to auscultation, normal respiratory effort Abd: Soft Ext: no edema Skin: scattered echomosis/purpura on left hip/left; no rash otherwise Neuro: drowsy MSK: no peripheral joint swelling; tender kind of diffusely   Central line presence: picc site no erythema/purulence   Lab Results Lab Results  Component Value Date   WBC 9.7 02/13/2024   HGB 7.9 (L) 02/13/2024   HCT 23.8 (L) 02/13/2024   MCV 96.0 02/13/2024   PLT 282 02/13/2024    Lab Results  Component Value Date   CREATININE 0.60 (L) 02/13/2024   BUN 20 02/13/2024   NA 133 (L) 02/13/2024   K 3.6 02/13/2024   CL 104 02/13/2024   CO2 22 02/13/2024    Lab Results  Component Value Date   ALT 28 02/08/2024   AST 30 02/08/2024   ALKPHOS 52 02/08/2024  BILITOT 0.6 02/08/2024      Microbiology: Recent Results (from the past 240 hours)  Culture, blood (Routine X 2) w Reflex to ID Panel     Status: None   Collection Time: 02/09/24  2:51 PM   Specimen: BLOOD LEFT HAND  Result Value Ref Range Status   Specimen Description BLOOD LEFT HAND  Final   Special Requests   Final    BOTTLES DRAWN AEROBIC AND ANAEROBIC Blood Culture adequate volume   Culture   Final    NO GROWTH 5 DAYS Performed at [facility], [address], [place], [place] [address]    Report Status 02/14/2024 FINAL  Final  Culture, blood (Routine X 2) w Reflex to ID Panel     Status: None   Collection Time: 02/09/24  2:51 PM   Specimen: BLOOD LEFT ARM  Result Value Ref Range Status   Specimen Description BLOOD LEFT ARM  Final   Special Requests   Final    BOTTLES DRAWN AEROBIC AND ANAEROBIC Blood Culture adequate volume   Culture   Final    NO GROWTH 5 DAYS Performed at [facility], [address], [place], [place] [address]    Report Status 02/14/2024 FINAL  Final  Culture, blood  (Routine X 2) w Reflex to ID Panel     Status: None (Preliminary result)   Collection Time: 02/12/24 11:16 AM   Specimen: BLOOD LEFT HAND  Result Value Ref Range Status   Specimen Description BLOOD LEFT HAND  Final   Special Requests   Final    BOTTLES DRAWN AEROBIC AND ANAEROBIC Blood Culture results may not be optimal due to an inadequate volume of blood received in culture bottles   Culture   Final    NO GROWTH 2 DAYS Performed at [facility], [address] [place], [place] [address]    Report Status PENDING  Incomplete  Culture, blood (Routine X 2) w Reflex to ID Panel     Status: None (Preliminary result)   Collection Time: 02/12/24 11:18 AM   Specimen: BLOOD LEFT HAND  Result Value Ref Range Status   Specimen Description BLOOD LEFT HAND  Final   Special Requests   Final    BOTTLES DRAWN AEROBIC AND ANAEROBIC Blood Culture adequate volume   Culture   Final    NO GROWTH 2 DAYS Performed at [facility], [address], [place], [place] [address]    Report Status PENDING  Incomplete     Serology:    Imaging: If present, new imagings (plain films, ct scans, and mri) have been personally visualized and interpreted; radiology reports have been reviewed. Decision making incorporated into the Impression / Recommendations.  5/22 chest ct 1. No acute intrathoracic process. 2. Emphysema. 3. Coronary artery calcification. 4. Aortic atherosclerosis.  5/13 ct head 1. Largely resolved left side but Increased Right Side Subdural Hematoma since 01/31/2024. Right SDH now up to 11 mm with increased leftward midline shift, (now 4-5 mm) and increased mass effect on the right lateral ventricle.   2. Small volume SAH now apparent. Stable small volume IVH. No acute ventriculomegaly. Possible small superimposed right posterior Emma sphere hemorrhagic contusion.   3. No skull fracture identified.     Jamesetta Mcbride, MD [facility] [phone] pager    02/14/2024, 1:00 PM

## 2024-02-14 NOTE — TOC Progression Note (Signed)
 Transition of Care [facility]) - Progression Note    Patient Details  Name: [name] MRN: [id number] Date of Birth: December 27, 1960  Transition of Care [facility]) CM/SW Contact  Jannice Mends, LCSW Phone Number: 02/14/2024, 3:17 PM  Clinical Narrative:    Provided update to [facility] and they stated insurance approval is effective til 5/26.   Expected Discharge Plan: Skilled Nursing Facility Barriers to Discharge: Continued Medical Work up  Expected Discharge Plan and Services In-house Referral: Clinical Social Work   Post Acute Care Choice: Skilled Nursing Facility Living arrangements for the past 2 months: Single Family Home                                       Social Determinants of Health (SDOH) Interventions SDOH Screenings   Food Insecurity: Patient Unable To Answer (02/02/2024)  Housing: Patient Unable To Answer (02/02/2024)  Transportation Needs: Patient Unable To Answer (02/02/2024)  Utilities: Patient Unable To Answer (02/02/2024)  Financial Resource Strain: Low Risk  (12/31/2022)   Received from [facility]  Physical Activity: Unknown (03/27/2023)   Received from [facility]  Social Connections: Somewhat Isolated (03/27/2023)   Received from [facility]  Stress: Stress Concern Present (03/27/2023)   Received from [facility]  Tobacco Use: High Risk (01/30/2024)    Readmission Risk Interventions     No data to display

## 2024-02-14 NOTE — Progress Notes (Addendum)
 PROGRESS NOTE        PATIENT DETAILS Name: [name] Age: 63 y.o. Sex: male Date of Birth: 1961/05/16 Admit Date: 01/30/2024 Admitting Physician Lind Repine, MD ZOX:[id number], Moises Ang, MD  Brief Summary: Patient is a 63 y.o.  male with history of COPD, DM-2, hypothyroidism, anxiety disorder-presented with a fall-found to have subdural hematoma and seizures.  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  Scattered SAH. 5/8>> CT C-spine: No fracture/traumatic malalignment 5/9>> CT head: Slight increased size of SDH.  Small volume SAH. 5/13>> CT head: Resolved left SDH, increased right SDH-11 mm.  Small volume SAH. 5/21>>CXR: No obvious pneumonia 5/22>> CT chest: No PNA 5/23>> B/L lower extremity Doppler: No DVT  Significant microbiology data: 5/8>> COVID/influenza/RSV PCR: Negative 5/8>> blood culture: Negative 5/12>> urine culture: E. coli 5/18>> blood cultures: No growth 5/21>> blood culture: No growth  Procedures: None  Consults: PCCM Neurology Neurosurgery  Subjective: Complains of some intermittent headaches- some intermittent dry cough.  No nausea, vomiting, diarrhea.  Discussed with RN staff-no skin breakdown in the sacral area.  Objective: Vitals: Blood pressure (!) 145/75, pulse 100, temperature 98.7 F (37.1 C), temperature source Oral, resp. rate 15, height 5\' 6"  (1.676 m), weight 84.5 kg, SpO2 92%.   Exam: Gen Exam:Alert awake-not in any distress HEENT:atraumatic, normocephalic Chest: B/L clear to auscultation anteriorly CVS:S1S2 regular Abdomen:soft non tender, non distended Extremities:no edema Neurology: Non focal Skin: no rash  Pertinent Labs/Radiology:    Latest Ref Rng &  Units 02/13/2024    2:15 AM 02/12/2024    2:43 AM 02/11/2024    3:25 AM  CBC  WBC 4.0 - 10.5 K/uL 9.7  11.1  11.1   Hemoglobin 13.0 - 17.0 g/dL 7.9  8.5  8.1   Hematocrit 39.0 - 52.0 % 23.8  25.6  24.1   Platelets 150 - 400 K/uL 282  315  310     Lab Results  Component Value Date   NA 133 (L) 02/13/2024   K 3.6 02/13/2024   CL 104 02/13/2024   CO2 22 02/13/2024      Assessment/Plan: Traumatic SDH/SAH Evaluated by neurosurgery-conservative/non operative management recommended  Acute toxic metabolic encephalopathy Multifactorial etiology-SDH/seizures/AEDs Overall much improved-awake/alert this morning  Seizures Likely provoked from SDH. No further seizures LTM EEG negative Continue phenobarbital  and Vimpat . Needs patient follow-up with neurology.  Complicated E. coli UTI S/p Rocephin  x 5 days-last dose on 5/17 However febrile again yesterday-see below  Fever Continues to have fever  No diarrhea-no skin break down in sacral area per RN staff. UA/chest x-ray/CT chest/blood cultures negative so far Has been using incentive spirometry regularly per spouse Unclear source of fever-getting lower ext Dopplers-if this is negative-this may be central fever from recent SDH.  May need to touch base with ID if workup continues to be negative. Continue to monitor off antibiotics-given clinical stability.  Addendum Lower ext Dopplers negative-have consulted ID for further recommendations.  Addendum Upper extremity Dopplers positive for DVT right axillary/right brachial vein.  Given recent SDH/SAH-case discussed with neurosurgery Royal Cordon, PA-C who will discuss with her attending and provide further recommendations regarding issue of  anticoagulation.  DVT likely probably related to PICC line-which has since been removed.  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  COPD Not in exacerbation Continue bronchodilators  Tobacco abuse Counseled  Nutrition Status: Nutrition Problem: Increased nutrient needs Etiology: chronic illness (COPD) Signs/Symptoms: estimated needs Interventions: Ensure Enlive (each supplement provides 350kcal and 20 grams of protein), MVI    Pressure Ulcer: Pressure Injury 01/31/24 Coccyx Mid Deep Tissue Pressure Injury - Purple or maroon localized area of discolored intact skin or blood-filled blister due to damage of underlying soft tissue from pressure and/or shear. (Active)  01/31/24 0100  Location: Coccyx  Location Orientation: Mid  Staging: Deep Tissue Pressure Injury - Purple or maroon localized area of discolored intact skin or blood-filled blister due to damage of underlying soft tissue from pressure and/or shear.  Wound Description (Comments):   Present on Admission: Yes  Dressing Type Foam - Lift dressing to assess site every shift 02/12/24 2100   Class 1 obesity: Estimated body mass index is 30.07 kg/m as calculated from the following:   Height as of this encounter: 5\' 6"  (1.676 m).   Weight as of this encounter: 84.5 kg.   Code status:   Code Status: Full Code   DVT Prophylaxis: Place and maintain sequential compression device Start: 02/05/24 0657 SCDs Start: 01/30/24 2330   Family Communication: Spouse [name] at bedside on 5/23.   Disposition Plan: Status is: Inpatient Remains inpatient appropriate because: Severity of illness   Planned Discharge Destination:Skilled nursing facility   Diet: Diet Order             DIET DYS 2 Room service appropriate? Yes with Assist; Fluid consistency: Thin  Diet effective now                     Antimicrobial agents: Anti-infectives (From admission, onward)    Start     Dose/Rate Route Frequency Ordered Stop   02/04/24 1030  cefTRIAXone  (ROCEPHIN ) 1 g in sodium  chloride 0.9 % 100 mL IVPB        1 g 200 mL/hr over 30 Minutes Intravenous Every 24 hours 02/04/24 0937 02/08/24 1257        MEDICATIONS: Scheduled Meds:  ALPRAZolam   0.5 mg Oral BID   arformoterol   15 mcg Nebulization BID   budesonide  (PULMICORT ) nebulizer solution  0.25 mg Nebulization BID   Chlorhexidine  Gluconate Cloth  6 each Topical Daily   feeding supplement  237 mL Oral BID BM   insulin  aspart  0-9 Units Subcutaneous Q4H   lacosamide   200 mg Oral BID   levothyroxine   150 mcg Oral Q0600   metoprolol  tartrate  25 mg Oral BID   multivitamin with minerals  1 tablet Oral Daily   mouth rinse  15 mL Mouth Rinse 4 times per day   pantoprazole   40 mg Oral Daily   phenobarbital   64.8 mg Oral BID   revefenacin   175 mcg Nebulization Daily   sodium chloride  flush  10-40 mL Intracatheter Q12H   sodium chloride   1 g Oral BID   Continuous Infusions:   PRN Meds:.docusate sodium , haloperidol  lactate, hydrALAZINE , hydrALAZINE , labetalol , ondansetron  (ZOFRAN ) IV, polyethylene glycol, sodium chloride  flush, traMADol    I have personally reviewed following labs and imaging studies  LABORATORY DATA: CBC: Recent Labs  Lab 02/08/24 0258 02/09/24 1227 02/10/24 0253 02/11/24 0325 02/12/24 0243  02/13/24 0215  WBC 11.2* 11.1* 12.6* 11.1* 11.1* 9.7  NEUTROABS 6.5  --   --   --   --   --   HGB 8.4* 7.9* 8.4* 8.1* 8.5* 7.9*  HCT 24.3* 23.3* 24.9* 24.1* 25.6* 23.8*  MCV 92.7 94.0 93.6 93.8 95.5 96.0  PLT 247 268 293 310 315 282    Basic Metabolic Panel: Recent Labs  Lab 02/09/24 1227 02/10/24 0253 02/11/24 0325 02/12/24 0243 02/13/24 0215  NA 128* 131* 131* 132* 133*  K 3.7 3.8 3.8 3.8 3.6  CL 99 99 101 102 104  CO2 24 23 23 22 22   GLUCOSE 152* 146* 167* 126* 127*  BUN 21 20 18 20 20   CREATININE 0.52* 0.51* 0.55* 0.64 0.60*  CALCIUM  8.4* 8.7* 8.4* 8.6* 8.5*  MG 1.9 1.9 1.8 1.9 1.8  PHOS  --  3.5 3.2 3.8 3.6    GFR: Estimated Creatinine Clearance: 93.9 mL/min (A) (by  C-G formula based on SCr of 0.6 mg/dL (L)).  Liver Function Tests: Recent Labs  Lab 02/08/24 0258  AST 30  ALT 28  ALKPHOS 52  BILITOT 0.6  PROT 6.2*  ALBUMIN 2.8*   No results for input(s): "LIPASE", "AMYLASE" in the last 168 hours. No results for input(s): "AMMONIA" in the last 168 hours.  Coagulation Profile: No results for input(s): "INR", "PROTIME" in the last 168 hours.  Cardiac Enzymes: No results for input(s): "CKTOTAL", "CKMB", "CKMBINDEX", "TROPONINI" in the last 168 hours.  BNP (last 3 results) No results for input(s): "PROBNP" in the last 8760 hours.  Lipid Profile: No results for input(s): "CHOL", "HDL", "LDLCALC", "TRIG", "CHOLHDL", "LDLDIRECT" in the last 72 hours.  Thyroid  Function Tests: No results for input(s): "TSH", "T4TOTAL", "FREET4", "T3FREE", "THYROIDAB" in the last 72 hours.  Anemia Panel: No results for input(s): "VITAMINB12", "FOLATE", "FERRITIN", "TIBC", "IRON", "RETICCTPCT" in the last 72 hours.  Urine analysis:    Component Value Date/Time   COLORURINE YELLOW 02/12/2024 1046   APPEARANCEUR CLEAR 02/12/2024 1046   LABSPEC 1.018 02/12/2024 1046   PHURINE 5.0 02/12/2024 1046   GLUCOSEU 50 (A) 02/12/2024 1046   HGBUR NEGATIVE 02/12/2024 1046   BILIRUBINUR NEGATIVE 02/12/2024 1046   KETONESUR NEGATIVE 02/12/2024 1046   PROTEINUR NEGATIVE 02/12/2024 1046   UROBILINOGEN 0.2 03/03/2014 1750   NITRITE NEGATIVE 02/12/2024 1046   LEUKOCYTESUR NEGATIVE 02/12/2024 1046    Sepsis Labs: Lactic Acid, Venous    Component Value Date/Time   LATICACIDVEN 2.1 (HH) 01/30/2024 2020    MICROBIOLOGY: Recent Results (from the past 240 hours)  Culture, blood (Routine X 2) w Reflex to ID Panel     Status: None   Collection Time: 02/09/24  2:51 PM   Specimen: BLOOD LEFT HAND  Result Value Ref Range Status   Specimen Description BLOOD LEFT HAND  Final   Special Requests   Final    BOTTLES DRAWN AEROBIC AND ANAEROBIC Blood Culture adequate volume    Culture   Final    NO GROWTH 5 DAYS Performed at [facility], [address] [place], [place] [address]    Report Status 02/14/2024 FINAL  Final  Culture, blood (Routine X 2) w Reflex to ID Panel     Status: None   Collection Time: 02/09/24  2:51 PM   Specimen: BLOOD LEFT ARM  Result Value Ref Range Status   Specimen Description BLOOD LEFT ARM  Final   Special Requests   Final    BOTTLES DRAWN AEROBIC AND ANAEROBIC Blood  Culture adequate volume   Culture   Final    NO GROWTH 5 DAYS Performed at [facility], [address] [place], [place] [address]    Report Status 02/14/2024 FINAL  Final  Culture, blood (Routine X 2) w Reflex to ID Panel     Status: None (Preliminary result)   Collection Time: 02/12/24 11:16 AM   Specimen: BLOOD LEFT HAND  Result Value Ref Range Status   Specimen Description BLOOD LEFT HAND  Final   Special Requests   Final    BOTTLES DRAWN AEROBIC AND ANAEROBIC Blood Culture results may not be optimal due to an inadequate volume of blood received in culture bottles   Culture   Final    NO GROWTH 2 DAYS Performed at [facility], [address], [place], [place] [address]    Report Status PENDING  Incomplete  Culture, blood (Routine X 2) w Reflex to ID Panel     Status: None (Preliminary result)   Collection Time: 02/12/24 11:18 AM   Specimen: BLOOD LEFT HAND  Result Value Ref Range Status   Specimen Description BLOOD LEFT HAND  Final   Special Requests   Final    BOTTLES DRAWN AEROBIC AND ANAEROBIC Blood Culture adequate volume   Culture   Final    NO GROWTH 2 DAYS Performed at [facility], [address], [place], [place] [address]    Report Status PENDING  Incomplete    RADIOLOGY STUDIES/RESULTS: CT CHEST WO CONTRAST Result Date: 02/13/2024 CLINICAL DATA:  Respiratory illness, nondiagnostic x-ray. Fever and cough, possible pneumonia. EXAM: CT CHEST WITHOUT CONTRAST TECHNIQUE: Multidetector CT imaging of the chest was  performed following the standard protocol without IV contrast. RADIATION DOSE REDUCTION: This exam was performed according to the departmental dose-optimization program which includes automated exposure control, adjustment of the mA and/or kV according to patient size and/or use of iterative reconstruction technique. COMPARISON:  02/12/2024, 09/06/2023. FINDINGS: Cardiovascular: The heart is normal in size of there is a trace pericardial effusion. Coronary artery calcifications are noted. The distal tip of a right PICC line terminates in the right atrium. There is atherosclerotic calcification of the aorta without evidence of aneurysm. The pulmonary trunk is normal in caliber. Mediastinum/Nodes: No mediastinal or axillary lymphadenopathy is seen. Evaluation of the hila is limited due to lack of IV contrast. The thyroid  gland, trachea, and esophagus are within normal limits. Lungs/Pleura: Paraseptal and centrilobular emphysematous changes are noted in the lungs. Atelectasis is noted bilaterally. No effusion or pneumothorax is seen. No definite pulmonary nodule or mass is seen. Upper Abdomen: A few scattered subcentimeter hypodensities are present in the liver which are too small to further characterize. No acute abnormality. Musculoskeletal: There is stable bony deformity in the proximal left humerus. Degenerative changes are present in the thoracic spine. No acute osseous abnormality is seen. IMPRESSION: 1. No acute intrathoracic process. 2. Emphysema. 3. Coronary artery calcification. 4. Aortic atherosclerosis. Electronically Signed   By: Wyvonnia Heimlich M.D.   On: 02/13/2024 19:50   DG Chest Port 1V same Day Result Date: 02/12/2024 CLINICAL DATA:  Fever. EXAM: PORTABLE CHEST 1 VIEW COMPARISON:  Feb 09, 2024. FINDINGS: The heart size and mediastinal contours are within normal limits. Stable right-sided PICC line. Both lungs are clear. The visualized skeletal structures are unremarkable. IMPRESSION: No active  disease. Electronically Signed   By: Rosalene Colon M.D.   On: 02/12/2024 14:55     LOS: 15 days  Kimberly Penna, MD  [facility]    To contact the attending provider between 7A-7P or the covering provider during after hours 7P-7A, please log into the web site [email or web address] and access using universal [place] password for that web site. If you do not have the password, please call the hospital operator.  02/14/2024, 10:30 AM

## 2024-02-14 NOTE — Progress Notes (Signed)
 Nutrition Follow-up  DOCUMENTATION CODES:   Not applicable  INTERVENTION:  MVI with minerals daily  Monitor for diet advancement and tolerance Continue Ensure Enlive po BID, each supplement provides 350 kcal and 20 grams of protein.  Add Magic cup TID with meals, each supplement provides 290 kcal and 9 grams of protein   NUTRITION DIAGNOSIS:  Increased nutrient needs related to chronic illness (COPD) as evidenced by estimated needs. - remains applicable  GOAL:  Patient will meet greater than or equal to 90% of their needs - progressing  MONITOR:  PO intake, Supplement acceptance, Diet advancement  REASON FOR ASSESSMENT:  Consult Enteral/tube feeding initiation and management  ASSESSMENT:  Pt with PMH of COPD and seizure disorder admitted after falling at home dx with acute R SDH with 3 mm midline shift and acute traumatic SAH.  5/9 - admitted; s/p cortrak placement; tip in gastric fundus  5/10 - Cortrak dislodged and NGT placed 5/11 - txr to 5W 5/13 - NGT removed 5/14 - Cortrak placed d/t decline in status, NPO d/t potential for pending surgery 5/15 - TF re-initiated at goal rate 5/15 - EEG: suggestive of cortical dysfunction arising from R hemisphere, mild diffuse encephalopathy 5/17 - LTM EEG discontinued 5/18 - 10 blood cultures: no growth 5/19 - MBS: dysphagia 2, thin liquids, nocturnal feedings to provide 50% of estimated needs 5/20 - Cortrak removed d/t adequate intake overnight 5/23 - diet advanced to DYS3/thins  Cortrak removed. No significant nutrition-related issues. Skin stable. Febrile yesterday with unclear source. Lower extremities Doppler ordered. Likely central fever from recent SDH. May need ID consult if w/u remains negative.   Average Meal Intake 5/19: 100% x1 documented meal 5/21: 75-100% x2 documented meals 5/22: 75% x1 documented meal  Intake desirable s/p Cortrak removal. Diet advanced to dysphagia 3, thin liquids today. No reported N/V/C/D.    Admit Weight: 93.7kg Current Weight: 84.5kg - ?accuracy; 10lbs weight loss overnight; will re-assess s/p next weight collection   Some generalized mild edema noted, however not significant. Bowels stable.  Intake/Output Summary (Last 24 hours) at 02/14/2024 1313 Last data filed at 02/13/2024 2111 Gross per 24 hour  Intake 10 ml  Output 400 ml  Net -390 ml    Net IO Since Admission: 2,707.16 mL [02/14/24 1313]   Drains/Lines: PICC (placed 5/14) External catheter UOP: x24 hours   Meds: SSI 0-9 q4, levothyroxine , MVI, pantoprazole , NaCl   Labs from 5/22 reviewed:  Na+ 131>132>133 (wdl) WBC 11.1>9.7 9wdl0 CBGs 126-127 x24 hours A1c 6.1 (01/2024)  Diet Order:   Diet Order             DIET DYS 2 Room service appropriate? Yes with Assist; Fluid consistency: Thin  Diet effective now            EDUCATION NEEDS:  Education needs have been addressed  Skin:  Skin Assessment: Skin Integrity Issues: Skin Integrity Issues:: DTI, Stage II DTI: coccyx Stage II: sacrum  Last BM:  5/23 - type 6 x1  Height:  Ht Readings from Last 1 Encounters:  01/30/24 5\' 6"  (1.676 m)   Weight:  Wt Readings from Last 1 Encounters:  02/13/24 84.5 kg   BMI:  Body mass index is 30.07 kg/m.  Estimated Nutritional Needs:   Kcal:  2000-2300  Protein:  105-125 grams  Fluid:  > 2 L/day  Con Decant MS, RD, LDN Registered Dietitian Clinical Nutrition RD Inpatient Contact Info in Amion

## 2024-02-14 NOTE — Progress Notes (Signed)
 During breathing treatment this morning, pt took off neb mask 6-7 times during treatment.  I coached pt to continue to take remainder of neb meds but pt said "no".  Pt states he doesn't like breathing treatments.  I encouraged pt to finish his breathing treatment, however pt states he does not want to take them.  HHN stopped per pt request and also d/t pt won't leave treatment mask on.  No distress noted, VSS at present.

## 2024-02-14 NOTE — Progress Notes (Signed)
 Venous duplex lower ext  has been completed. Refer to [facility] under chart review to view preliminary results.   02/14/2024  12:56 PM Jazmyn Offner, Hollace Lund ;

## 2024-02-14 NOTE — Progress Notes (Signed)
 Speech Language Pathology Treatment: Dysphagia  Patient Details Name: [name] MRN: [id number] DOB: 1960-12-04 Today's Date: 02/14/2024 Time: 1205-1218 SLP Time Calculation (min) (ACUTE ONLY): 13 min  Assessment / Plan / Recommendation Clinical Impression  [name] reports pt has been experiencing nausea but continues to have good intake at mealtimes. He agreed to trial advanced solids, masticating them thoroughly before expectorating them throughout the course of multiple trials. His wife states he is a picky eater at baseline but choices are limited on the unit of simulated Dys 3 or regular texture solids. Given seemingly functional mastication without significant residue or dysphagia on the MBS 5/19, will upgrade his diet to Dys 3 and check in as able to observe with a meal. His wife is typically present to assist pt with feeding so education was provided regarding monitoring for oral residue or difficulty masticating. SLP will continue following.    HPI HPI: Pt is a 63 y.o. male who was brought to ED by EMS from home after a fall. CT head (01/30/23) revealed "Slightly increased size of right larger than left subdural hematomas. Small volume subarachnoid hemorrhage with mildly increased hemorrhage in the left lateral ventricle. Failed yale 02/01/24. PMH: Seizure disorder, COPD; ST f/u for po readiness in acute setting; pt currently has TF for nutritive/hydration purposes.      SLP Plan  Continue with current plan of care      Recommendations for follow up therapy are one component of a multi-disciplinary discharge planning process, led by the attending physician.  Recommendations may be updated based on patient status, additional functional criteria and insurance authorization.    Recommendations  Diet recommendations: Dysphagia 3 (mechanical soft);Thin liquid Liquids provided via: Cup;Straw Medication Administration: Whole meds with puree Supervision: Full supervision/cueing for  compensatory strategies;Trained caregiver to feed patient Compensations: Slow rate;Small sips/bites;Minimize environmental distractions Postural Changes and/or Swallow Maneuvers: Seated upright 90 degrees                  Oral care BID;Staff/trained caregiver to provide oral care   Frequent or constant Supervision/Assistance Dysphagia, oropharyngeal phase (R13.12)     Continue with current plan of care     [name], M.A., CCC-SLP Speech Language Pathology, Acute Rehabilitation Services  Secure Chat preferred [phone]   02/14/2024, 12:47 PM

## 2024-02-14 NOTE — Plan of Care (Signed)

## 2024-02-14 NOTE — Progress Notes (Addendum)
 Asked about safety of anticoagulation given patient's new diagnosis of upper extremity DVT.  Unfortunately, with his intracranial hemorrhage including moderate-sized recent subdural hematoma, his intracranial bleeding is likely to worsen with anticoagulation.  However, he certainly can be started on pharmacologic DVT prophylaxis.  He likely would benefit from a repeat CT head on 5/27 (2 weeks from last CT) to assess for resolution /evolution of his intracranial hemorrhage.

## 2024-02-14 NOTE — Progress Notes (Signed)
 Bilateral upper extremity venous duplex has been completed. Preliminary results can be found in CV Proc through chart review.  Results were given to Dr. Shereen Dike.  02/14/24 4:59 PM Birda Buffy RVT

## 2024-02-15 DIAGNOSIS — E035 Myxedema coma: Secondary | ICD-10-CM | POA: Diagnosis not present

## 2024-02-15 DIAGNOSIS — D649 Anemia, unspecified: Secondary | ICD-10-CM | POA: Diagnosis not present

## 2024-02-15 DIAGNOSIS — S065XAA Traumatic subdural hemorrhage with loss of consciousness status unknown, initial encounter: Secondary | ICD-10-CM | POA: Diagnosis not present

## 2024-02-15 DIAGNOSIS — Z79899 Other long term (current) drug therapy: Secondary | ICD-10-CM | POA: Diagnosis not present

## 2024-02-15 LAB — GLUCOSE, CAPILLARY
Glucose-Capillary: 112 mg/dL — ABNORMAL HIGH (ref 70–99)
Glucose-Capillary: 126 mg/dL — ABNORMAL HIGH (ref 70–99)
Glucose-Capillary: 172 mg/dL — ABNORMAL HIGH (ref 70–99)
Glucose-Capillary: 132 mg/dL — ABNORMAL HIGH (ref 70–99)
Glucose-Capillary: 133 mg/dL — ABNORMAL HIGH (ref 70–99)
Glucose-Capillary: 140 mg/dL — ABNORMAL HIGH (ref 70–99)

## 2024-02-15 MED ORDER — HEPARIN SODIUM (PORCINE) 5000 UNIT/ML IJ SOLN
5000.0000 [IU] | Freq: Three times a day (TID) | INTRAMUSCULAR | Status: DC
  Administered 2024-02-15 – 2024-02-18 (×9): 5000 [IU] via SUBCUTANEOUS
  Filled 2024-02-15 (×9): qty 1

## 2024-02-15 NOTE — Plan of Care (Signed)

## 2024-02-15 NOTE — Progress Notes (Signed)
 PT Cancellation Note  Patient Details Name: [name] MRN: [id number] DOB: 14-Apr-1961   Cancelled Treatment:    Reason Eval/Treat Not Completed: Medical issues which prohibited therapy  DVT of RUE noted. Per neurosurgery, not appropriate for therapeutic anticoagulation but okay for prophylactic anticoagulation due to SDH. Spoke with attending; will hold scheduled PT visit Saturday, okay to resume Sunday.  Jory Ng, PT, DPT [facility]  Rehabilitation Services Physical Therapist Office: [phone] Website: [email or web address]    Alinda Irani 02/15/2024, 2:16 PM

## 2024-02-15 NOTE — Progress Notes (Signed)
 PROGRESS NOTE        PATIENT DETAILS Name: [name] Age: 63 y.o. Sex: male Date of Birth: August 28, 1961 Admit Date: 01/30/2024 Admitting Physician Lind Repine, MD UJW:[id number], Moises Ang, MD  Brief Summary: Patient is a 64 y.o.  male with history of COPD, DM-2, hypothyroidism, anxiety disorder-presented with a fall-found to have subdural hematoma and seizures.  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  Scattered SAH. 5/8>> CT C-spine: No fracture/traumatic malalignment 5/9>> CT head: Slight increased size of SDH.  Small volume SAH. 5/13>> CT head: Resolved left SDH, increased right SDH-11 mm.  Small volume SAH. 5/21>>CXR: No obvious pneumonia 5/22>> CT chest: No PNA 5/23>> B/L lower extremity Doppler: No DVT 5/23>> B/L upper extremity Doppler: DVT right axillary/right brachial vein, SVT left cephalic vein  Significant microbiology data: 5/8>> COVID/influenza/RSV PCR: Negative 5/8>> blood culture: Negative 5/12>> urine culture: E. coli 5/18>> blood cultures: No growth 5/21>> blood culture: No growth  Procedures: None  Consults: PCCM Neurology Neurosurgery Infectious disease  Subjective: Sleeping comfortably-afebrile overnight.  Objective: Vitals: Blood pressure 139/72, pulse 81, temperature 98.2 F (36.8 C), temperature source Axillary, resp. rate 14, height 5\' 6"  (1.676 m), weight 82.4 kg, SpO2 92%.   Exam: Awake/alert-chronically frail appearing Chest: Clear to auscultation Abdomen: Soft nontender nondistended Nonfocal exam  Pertinent Labs/Radiology:    Latest Ref Rng & Units 02/13/2024    2:15 AM 02/12/2024    2:43 AM 02/11/2024    3:25 AM  CBC  WBC 4.0 - 10.5 K/uL 9.7  11.1  11.1    Hemoglobin 13.0 - 17.0 g/dL 7.9  8.5  8.1   Hematocrit 39.0 - 52.0 % 23.8  25.6  24.1   Platelets 150 - 400 K/uL 282  315  310     Lab Results  Component Value Date   NA 133 (L) 02/13/2024   K 3.6 02/13/2024   CL 104 02/13/2024   CO2 22 02/13/2024      Assessment/Plan: Traumatic SDH/SAH Evaluated by neurosurgery-conservative/non operative management recommended  Acute toxic metabolic encephalopathy Multifactorial etiology-SDH/seizures/AEDs Overall much improved-awake/alert this morning  Seizures Likely provoked from SDH. No further seizures LTM EEG negative Continue phenobarbital  and Vimpat . Needs patient follow-up with neurology.  Complicated E. coli UTI S/p Rocephin  x 5 days-last dose on 5/17  Fever No fever overnight Extensive workup negative for infectious cause Suspicion that fever is likely secondary to DVT in the right upper extremity no further fever overnight See below.  Right upper extremity DVT Possible related to PICC line (now removed-5/23) Unable to anticoagulate given recent moderate SDH Appreciate neurosurgical input-okay for prophylactic anticoagulation but not appropriate for therapeutic anticoagulation.  Recommendations are to proceed with repeat CT head on 5/27. Note discussed with spouse (retired RN)-she is okay with prophylactic anticoagulation-understands the risks of bleeding.  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  COPD Not in exacerbation Continue bronchodilators  Tobacco abuse Counseled  Nutrition Status: Nutrition Problem: Increased nutrient needs Etiology: chronic illness (COPD) Signs/Symptoms: estimated needs Interventions: Ensure Enlive (  each supplement provides 350kcal and 20 grams of protein), MVI    Pressure Ulcer: Pressure Injury 01/31/24 Coccyx Mid  Deep Tissue Pressure Injury - Purple or maroon localized area of discolored intact skin or blood-filled blister due to damage of underlying soft tissue from pressure and/or shear. (Active)  01/31/24 0100  Location: Coccyx  Location Orientation: Mid  Staging: Deep Tissue Pressure Injury - Purple or maroon localized area of discolored intact skin or blood-filled blister due to damage of underlying soft tissue from pressure and/or shear.  Wound Description (Comments):   Present on Admission: Yes  Dressing Type Foam - Lift dressing to assess site every shift 02/14/24 1600   Class 1 obesity: Estimated body mass index is 29.32 kg/m as calculated from the following:   Height as of this encounter: 5\' 6"  (1.676 m).   Weight as of this encounter: 82.4 kg.   Code status:   Code Status: Full Code   DVT Prophylaxis: heparin  injection 5,000 Units Start: 02/15/24 1400 Place and maintain sequential compression device Start: 02/05/24 0657 SCDs Start: 01/30/24 2330   Family Communication: Spouse [name]-[phone] at bedside on 5/24   Disposition Plan: Status is: Inpatient Remains inpatient appropriate because: Severity of illness   Planned Discharge Destination:Skilled nursing facility   Diet: Diet Order             DIET DYS 3 Room service appropriate? Yes with Assist; Fluid consistency: Thin  Diet effective now                     Antimicrobial agents: Anti-infectives (From admission, onward)    Start     Dose/Rate Route Frequency Ordered Stop   02/04/24 1030  cefTRIAXone  (ROCEPHIN ) 1 g in sodium chloride  0.9 % 100 mL IVPB        1 g 200 mL/hr over 30 Minutes Intravenous Every 24 hours 02/04/24 0937 02/08/24 1257        MEDICATIONS: Scheduled Meds:  ALPRAZolam   0.5 mg Oral BID   arformoterol   15 mcg Nebulization BID   budesonide  (PULMICORT ) nebulizer solution  0.25 mg Nebulization BID   Chlorhexidine  Gluconate Cloth  6 each Topical Daily   feeding supplement  237 mL  Oral BID BM   heparin  injection (subcutaneous)  5,000 Units Subcutaneous Q8H   insulin  aspart  0-9 Units Subcutaneous Q4H   lacosamide   200 mg Oral BID   levothyroxine   150 mcg Oral Q0600   metoprolol  tartrate  25 mg Oral BID   multivitamin with minerals  1 tablet Oral Daily   mouth rinse  15 mL Mouth Rinse 4 times per day   pantoprazole   40 mg Oral Daily   phenobarbital   64.8 mg Oral BID   revefenacin   175 mcg Nebulization Daily   sodium chloride  flush  10-40 mL Intracatheter Q12H   sodium chloride   1 g Oral BID   Continuous Infusions:   PRN Meds:.docusate sodium , haloperidol  lactate, hydrALAZINE , hydrALAZINE , labetalol , ondansetron  (ZOFRAN ) IV, polyethylene glycol, sodium chloride  flush, traMADol    I have personally reviewed following labs and imaging studies  LABORATORY DATA: CBC: Recent Labs  Lab 02/09/24 1227 02/10/24 0253 02/11/24 0325 02/12/24 0243 02/13/24 0215  WBC 11.1* 12.6* 11.1* 11.1* 9.7  HGB 7.9* 8.4* 8.1* 8.5* 7.9*  HCT 23.3* 24.9* 24.1* 25.6* 23.8*  MCV 94.0 93.6 93.8 95.5 96.0  PLT 268 293 310 315 282    Basic Metabolic Panel: Recent Labs  Lab 02/09/24 1227 02/10/24 0253 02/11/24 0325 02/12/24 0243 02/13/24  0215  NA 128* 131* 131* 132* 133*  K 3.7 3.8 3.8 3.8 3.6  CL 99 99 101 102 104  CO2 24 23 23 22 22   GLUCOSE 152* 146* 167* 126* 127*  BUN 21 20 18 20 20   CREATININE 0.52* 0.51* 0.55* 0.64 0.60*  CALCIUM  8.4* 8.7* 8.4* 8.6* 8.5*  MG 1.9 1.9 1.8 1.9 1.8  PHOS  --  3.5 3.2 3.8 3.6    GFR: Estimated Creatinine Clearance: 92.7 mL/min (A) (by C-G formula based on SCr of 0.6 mg/dL (L)).  Liver Function Tests: No results for input(s): "AST", "ALT", "ALKPHOS", "BILITOT", "PROT", "ALBUMIN" in the last 168 hours.  No results for input(s): "LIPASE", "AMYLASE" in the last 168 hours. No results for input(s): "AMMONIA" in the last 168 hours.  Coagulation Profile: No results for input(s): "INR", "PROTIME" in the last 168 hours.  Cardiac  Enzymes: No results for input(s): "CKTOTAL", "CKMB", "CKMBINDEX", "TROPONINI" in the last 168 hours.  BNP (last 3 results) No results for input(s): "PROBNP" in the last 8760 hours.  Lipid Profile: No results for input(s): "CHOL", "HDL", "LDLCALC", "TRIG", "CHOLHDL", "LDLDIRECT" in the last 72 hours.  Thyroid  Function Tests: No results for input(s): "TSH", "T4TOTAL", "FREET4", "T3FREE", "THYROIDAB" in the last 72 hours.  Anemia Panel: No results for input(s): "VITAMINB12", "FOLATE", "FERRITIN", "TIBC", "IRON", "RETICCTPCT" in the last 72 hours.  Urine analysis:    Component Value Date/Time   COLORURINE YELLOW 02/12/2024 1046   APPEARANCEUR CLEAR 02/12/2024 1046   LABSPEC 1.018 02/12/2024 1046   PHURINE 5.0 02/12/2024 1046   GLUCOSEU 50 (A) 02/12/2024 1046   HGBUR NEGATIVE 02/12/2024 1046   BILIRUBINUR NEGATIVE 02/12/2024 1046   KETONESUR NEGATIVE 02/12/2024 1046   PROTEINUR NEGATIVE 02/12/2024 1046   UROBILINOGEN 0.2 03/03/2014 1750   NITRITE NEGATIVE 02/12/2024 1046   LEUKOCYTESUR NEGATIVE 02/12/2024 1046    Sepsis Labs: Lactic Acid, Venous    Component Value Date/Time   LATICACIDVEN 2.1 (HH) 01/30/2024 2020    MICROBIOLOGY: Recent Results (from the past 240 hours)  Culture, blood (Routine X 2) w Reflex to ID Panel     Status: None   Collection Time: 02/09/24  2:51 PM   Specimen: BLOOD LEFT HAND  Result Value Ref Range Status   Specimen Description BLOOD LEFT HAND  Final   Special Requests   Final    BOTTLES DRAWN AEROBIC AND ANAEROBIC Blood Culture adequate volume   Culture   Final    NO GROWTH 5 DAYS Performed at [facility], [address], [place], [place] [address]    Report Status 02/14/2024 FINAL  Final  Culture, blood (Routine X 2) w Reflex to ID Panel     Status: None   Collection Time: 02/09/24  2:51 PM   Specimen: BLOOD LEFT ARM  Result Value Ref Range Status   Specimen Description BLOOD LEFT ARM  Final   Special Requests   Final    BOTTLES  DRAWN AEROBIC AND ANAEROBIC Blood Culture adequate volume   Culture   Final    NO GROWTH 5 DAYS Performed at [facility], [address], [place], [place] [address]    Report Status 02/14/2024 FINAL  Final  Culture, blood (Routine X 2) w Reflex to ID Panel     Status: None (Preliminary result)   Collection Time: 02/12/24 11:16 AM   Specimen: BLOOD LEFT HAND  Result Value Ref Range Status   Specimen Description BLOOD LEFT HAND  Final   Special Requests  Final    BOTTLES DRAWN AEROBIC AND ANAEROBIC Blood Culture results may not be optimal due to an inadequate volume of blood received in culture bottles   Culture   Final    NO GROWTH 3 DAYS Performed at [facility], [address] [place], [place] [address]    Report Status PENDING  Incomplete  Culture, blood (Routine X 2) w Reflex to ID Panel     Status: None (Preliminary result)   Collection Time: 02/12/24 11:18 AM   Specimen: BLOOD LEFT HAND  Result Value Ref Range Status   Specimen Description BLOOD LEFT HAND  Final   Special Requests   Final    BOTTLES DRAWN AEROBIC AND ANAEROBIC Blood Culture adequate volume   Culture   Final    NO GROWTH 3 DAYS Performed at [facility], [address], [place], [place] [address]    Report Status PENDING  Incomplete  Respiratory (~20 pathogens) panel by PCR     Status: None   Collection Time: 02/14/24  2:32 PM   Specimen: Nasopharyngeal Swab; Respiratory  Result Value Ref Range Status   Adenovirus NOT DETECTED NOT DETECTED Final   Coronavirus 229E NOT DETECTED NOT DETECTED Final    Comment: (NOTE) The Coronavirus on the Respiratory Panel, DOES NOT test for the novel  Coronavirus (2019 nCoV)    Coronavirus HKU1 NOT DETECTED NOT DETECTED Final   Coronavirus NL63 NOT DETECTED NOT DETECTED Final   Coronavirus OC43 NOT DETECTED NOT DETECTED Final   Metapneumovirus NOT DETECTED NOT DETECTED Final   Rhinovirus / Enterovirus NOT DETECTED NOT DETECTED Final   Influenza A  NOT DETECTED NOT DETECTED Final   Influenza B NOT DETECTED NOT DETECTED Final   Parainfluenza Virus 1 NOT DETECTED NOT DETECTED Final   Parainfluenza Virus 2 NOT DETECTED NOT DETECTED Final   Parainfluenza Virus 3 NOT DETECTED NOT DETECTED Final   Parainfluenza Virus 4 NOT DETECTED NOT DETECTED Final   Respiratory Syncytial Virus NOT DETECTED NOT DETECTED Final   Bordetella pertussis NOT DETECTED NOT DETECTED Final   Bordetella Parapertussis NOT DETECTED NOT DETECTED Final   Chlamydophila pneumoniae NOT DETECTED NOT DETECTED Final   Mycoplasma pneumoniae NOT DETECTED NOT DETECTED Final    Comment: Performed at [facility], [address], [place], [place] [address]    RADIOLOGY STUDIES/RESULTS: VAS US  UPPER EXTREMITY VENOUS DUPLEX Result Date: 02/15/2024 UPPER VENOUS STUDY  Patient Name:  JIMMY PLESSINGER  Date of Exam:   02/14/2024 Medical Rec #: [id number]      Accession #:    [id number] Date of Birth: 02-26-1961      Patient Gender: M Patient Age:   88 years Exam Location:  [facility] Procedure:      VAS US  UPPER EXTREMITY VENOUS DUPLEX Referring Phys: TRUNG VU --------------------------------------------------------------------------------  Indications: FUO Risk Factors: Right PICC. Limitations: Poor ultrasound/tissue interface and patient positioning, patient immobility, patient guarding left arm. Comparison Study: No prior studies. Performing Technologist: Lerry Ransom RVT  Examination Guidelines: A complete evaluation includes B-mode imaging, spectral Doppler, color Doppler, and power Doppler as needed of all accessible portions of each vessel. Bilateral testing is considered an integral part of a complete examination. Limited examinations for reoccurring indications may be performed as noted.  Right Findings: +----------+------------+---------+-----------+----------+-------+ RIGHT     CompressiblePhasicitySpontaneousPropertiesSummary  +----------+------------+---------+-----------+----------+-------+ IJV           Full       Yes       Yes                      +----------+------------+---------+-----------+----------+-------+  Subclavian               Yes       Yes                      +----------+------------+---------+-----------+----------+-------+ Axillary      None       No        No                Acute  +----------+------------+---------+-----------+----------+-------+ Brachial    Partial      Yes       Yes               Acute  +----------+------------+---------+-----------+----------+-------+ Radial        Full                                          +----------+------------+---------+-----------+----------+-------+ Ulnar         Full                                          +----------+------------+---------+-----------+----------+-------+ Cephalic      Full                                          +----------+------------+---------+-----------+----------+-------+ Basilic       Full                                          +----------+------------+---------+-----------+----------+-------+  Left Findings: +----------+------------+---------+-----------+----------+--------------+ LEFT      CompressiblePhasicitySpontaneousProperties   Summary     +----------+------------+---------+-----------+----------+--------------+ IJV           Full       Yes       Yes                             +----------+------------+---------+-----------+----------+--------------+ Subclavian               Yes       Yes                             +----------+------------+---------+-----------+----------+--------------+ Axillary      Full       Yes       Yes                             +----------+------------+---------+-----------+----------+--------------+ Brachial                                            Not visualized  +----------+------------+---------+-----------+----------+--------------+ Radial                                              Not visualized +----------+------------+---------+-----------+----------+--------------+ Ulnar  Not visualized +----------+------------+---------+-----------+----------+--------------+ Cephalic      None                                      Acute      +----------+------------+---------+-----------+----------+--------------+ Basilic       Full                                                 +----------+------------+---------+-----------+----------+--------------+ Thrombus located in the cephalic vein is noted to extend from the proximal upper arm into the distal upper arm. Unable to visualize cephalic vein in the [facility] fossa to determine involvement.  Summary:  Right: No evidence of superficial vein thrombosis in the upper extremity. Findings consistent with acute deep vein thrombosis involving the right axillary vein and right brachial veins.  Left: Findings consistent with acute superficial vein thrombosis involving the left cephalic vein.  *See table(s) above for measurements and observations.  Diagnosing physician: Irvin Mantel Electronically signed by Irvin Mantel on 02/15/2024 at 10:14:30 AM.    Final    VAS US  LOWER EXTREMITY VENOUS (DVT) Result Date: 02/15/2024  Lower Venous DVT Study Patient Name:  NIKOLOZ HUY  Date of Exam:   02/14/2024 Medical Rec #: [id number]      Accession #:    [id number] Date of Birth: April 09, 1961      Patient Gender: M Patient Age:   77 years Exam Location:  [facility] Procedure:      VAS US  LOWER EXTREMITY VENOUS (DVT) Referring Phys: Kimberly Penna --------------------------------------------------------------------------------  Indications: Swelling, and fever.  Comparison Study: No priors. Performing Technologist: Franky Ivanoff Sturdivant-Jones RDMS, RVT  Examination Guidelines: A  complete evaluation includes B-mode imaging, spectral Doppler, color Doppler, and power Doppler as needed of all accessible portions of each vessel. Bilateral testing is considered an integral part of a complete examination. Limited examinations for reoccurring indications may be performed as noted. The reflux portion of the exam is performed with the patient in reverse Trendelenburg.  +---------+---------------+---------+-----------+----------+--------------+ RIGHT    CompressibilityPhasicitySpontaneityPropertiesThrombus Aging +---------+---------------+---------+-----------+----------+--------------+ CFV      Full           Yes      Yes                                 +---------+---------------+---------+-----------+----------+--------------+ SFJ      Full                                                        +---------+---------------+---------+-----------+----------+--------------+ FV Prox  Full                                                        +---------+---------------+---------+-----------+----------+--------------+ FV Mid   Full                                                        +---------+---------------+---------+-----------+----------+--------------+  FV DistalFull                                                        +---------+---------------+---------+-----------+----------+--------------+ PFV      Full                                                        +---------+---------------+---------+-----------+----------+--------------+ POP      Full           Yes      Yes                                 +---------+---------------+---------+-----------+----------+--------------+ PTV      Full                                                        +---------+---------------+---------+-----------+----------+--------------+ PERO     Full                                                         +---------+---------------+---------+-----------+----------+--------------+   +---------+---------------+---------+-----------+----------+--------------+ LEFT     CompressibilityPhasicitySpontaneityPropertiesThrombus Aging +---------+---------------+---------+-----------+----------+--------------+ CFV      Full           Yes      Yes                                 +---------+---------------+---------+-----------+----------+--------------+ SFJ      Full                                                        +---------+---------------+---------+-----------+----------+--------------+ FV Prox  Full                                                        +---------+---------------+---------+-----------+----------+--------------+ FV Mid   Full                                                        +---------+---------------+---------+-----------+----------+--------------+ FV DistalFull                                                        +---------+---------------+---------+-----------+----------+--------------+  PFV      Full                                                        +---------+---------------+---------+-----------+----------+--------------+ POP      Full           Yes      Yes                                 +---------+---------------+---------+-----------+----------+--------------+ PTV      Full                                                        +---------+---------------+---------+-----------+----------+--------------+ PERO     Full                                                        +---------+---------------+---------+-----------+----------+--------------+     Summary: BILATERAL: - No evidence of deep vein thrombosis seen in the lower extremities, bilaterally. -No evidence of popliteal cyst, bilaterally.   *See table(s) above for measurements and observations. Electronically signed by Irvin Mantel on 02/15/2024 at 10:13:08 AM.     Final    CT CHEST WO CONTRAST Result Date: 02/13/2024 CLINICAL DATA:  Respiratory illness, nondiagnostic x-ray. Fever and cough, possible pneumonia. EXAM: CT CHEST WITHOUT CONTRAST TECHNIQUE: Multidetector CT imaging of the chest was performed following the standard protocol without IV contrast. RADIATION DOSE REDUCTION: This exam was performed according to the departmental dose-optimization program which includes automated exposure control, adjustment of the mA and/or kV according to patient size and/or use of iterative reconstruction technique. COMPARISON:  02/12/2024, 09/06/2023. FINDINGS: Cardiovascular: The heart is normal in size of there is a trace pericardial effusion. Coronary artery calcifications are noted. The distal tip of a right PICC line terminates in the right atrium. There is atherosclerotic calcification of the aorta without evidence of aneurysm. The pulmonary trunk is normal in caliber. Mediastinum/Nodes: No mediastinal or axillary lymphadenopathy is seen. Evaluation of the hila is limited due to lack of IV contrast. The thyroid  gland, trachea, and esophagus are within normal limits. Lungs/Pleura: Paraseptal and centrilobular emphysematous changes are noted in the lungs. Atelectasis is noted bilaterally. No effusion or pneumothorax is seen. No definite pulmonary nodule or mass is seen. Upper Abdomen: A few scattered subcentimeter hypodensities are present in the liver which are too small to further characterize. No acute abnormality. Musculoskeletal: There is stable bony deformity in the proximal left humerus. Degenerative changes are present in the thoracic spine. No acute osseous abnormality is seen. IMPRESSION: 1. No acute intrathoracic process. 2. Emphysema. 3. Coronary artery calcification. 4. Aortic atherosclerosis. Electronically Signed   By: Wyvonnia Heimlich M.D.   On: 02/13/2024 19:50     LOS: 16 days   Kimberly Penna, MD  [facility]    To contact the attending  provider between 7A-7P or the covering provider during after hours 7P-7A, please log into the web  site [email or web address] and access using universal [place] password for that web site. If you do not have the password, please call the hospital operator.  02/15/2024, 10:46 AM

## 2024-02-16 DIAGNOSIS — D696 Thrombocytopenia, unspecified: Secondary | ICD-10-CM | POA: Diagnosis not present

## 2024-02-16 DIAGNOSIS — Z79899 Other long term (current) drug therapy: Secondary | ICD-10-CM | POA: Diagnosis not present

## 2024-02-16 DIAGNOSIS — S065XAA Traumatic subdural hemorrhage with loss of consciousness status unknown, initial encounter: Secondary | ICD-10-CM | POA: Diagnosis not present

## 2024-02-16 DIAGNOSIS — D649 Anemia, unspecified: Secondary | ICD-10-CM | POA: Diagnosis not present

## 2024-02-16 LAB — GLUCOSE, CAPILLARY
Glucose-Capillary: 122 mg/dL — ABNORMAL HIGH (ref 70–99)
Glucose-Capillary: 126 mg/dL — ABNORMAL HIGH (ref 70–99)
Glucose-Capillary: 138 mg/dL — ABNORMAL HIGH (ref 70–99)
Glucose-Capillary: 147 mg/dL — ABNORMAL HIGH (ref 70–99)
Glucose-Capillary: 157 mg/dL — ABNORMAL HIGH (ref 70–99)
Glucose-Capillary: 163 mg/dL — ABNORMAL HIGH (ref 70–99)

## 2024-02-16 NOTE — Plan of Care (Signed)
  Problem: Fluid Volume: Goal: Ability to maintain a balanced intake and output will improve Outcome: Progressing   Problem: Health Behavior/Discharge Planning: Goal: Ability to manage health-related needs will improve Outcome: Progressing   Problem: Nutritional: Goal: Maintenance of adequate nutrition will improve Outcome: Progressing   Problem: Skin Integrity: Goal: Risk for impaired skin integrity will decrease Outcome: Progressing   Problem: Clinical Measurements: Goal: Will remain free from infection Outcome: Progressing Goal: Cardiovascular complication will be avoided Outcome: Progressing   Problem: Nutrition: Goal: Adequate nutrition will be maintained Outcome: Progressing

## 2024-02-16 NOTE — Plan of Care (Signed)

## 2024-02-16 NOTE — Plan of Care (Signed)
  Problem: Fluid Volume: Goal: Ability to maintain a balanced intake and output will improve Outcome: Progressing   Problem: Education: Goal: Knowledge of General Education information will improve Description: Including pain rating scale, medication(s)/side effects and non-pharmacologic comfort measures Outcome: Progressing   Problem: Clinical Measurements: Goal: Will remain free from infection Outcome: Progressing Goal: Cardiovascular complication will be avoided Outcome: Progressing   Problem: Activity: Goal: Risk for activity intolerance will decrease Outcome: Progressing   Problem: Coping: Goal: Level of anxiety will decrease Outcome: Progressing   Problem: Safety: Goal: Ability to remain free from injury will improve Outcome: Progressing

## 2024-02-16 NOTE — Progress Notes (Addendum)
 PROGRESS NOTE        PATIENT DETAILS Name: [name] Age: 63 y.o. Sex: male Date of Birth: 02-Apr-1961 Admit Date: 01/30/2024 Admitting Physician Lind Repine, MD ZOX:[id number], Moises Ang, MD  Brief Summary: Patient is a 63 y.o.  male with history of COPD, DM-2, hypothyroidism, anxiety disorder-presented with a fall-found to have subdural hematoma and seizures.  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  Scattered SAH. 5/8>> CT C-spine: No fracture/traumatic malalignment 5/9>> CT head: Slight increased size of SDH.  Small volume SAH. 5/13>> CT head: Resolved left SDH, increased right SDH-11 mm.  Small volume SAH. 5/21>>CXR: No obvious pneumonia 5/22>> CT chest: No PNA 5/23>> B/L lower extremity Doppler: No DVT 5/23>> B/L upper extremity Doppler: DVT right axillary/right brachial vein, SVT left cephalic vein  Significant microbiology data: 5/8>> COVID/influenza/RSV PCR: Negative 5/8>> blood culture: Negative 5/12>> urine culture: E. coli 5/18>> blood cultures: No growth 5/21>> blood culture: No growth 5/23>> respiratory virus panel: Negative  Procedures: None  Consults: PCCM Neurology Neurosurgery Infectious disease  Subjective: No complaints-awake/alert-unchanged the past several days.  Denies any chest pain.  Objective: Vitals: Blood pressure 133/75, pulse 87, temperature 98.8 F (37.1 C), temperature source Axillary, resp. rate 18, height 5\' 6"  (1.676 m), weight 82.6 kg, SpO2 93%.   Exam: Awake/alert Chronically frail appearing-however not in any distress Chest: Clear to auscultation anteriorly CVS: S1-S2 regular Abdomen: Soft nontender nondistended Nonfocal exam-but with generalized  weakness.  Pertinent Labs/Radiology:    Latest Ref Rng & Units 02/13/2024    2:15 AM 02/12/2024    2:43 AM 02/11/2024    3:25 AM  CBC  WBC 4.0 - 10.5 K/uL 9.7  11.1  11.1   Hemoglobin 13.0 - 17.0 g/dL 7.9  8.5  8.1   Hematocrit 39.0 - 52.0 % 23.8  25.6  24.1   Platelets 150 - 400 K/uL 282  315  310     Lab Results  Component Value Date   NA 133 (L) 02/13/2024   K 3.6 02/13/2024   CL 104 02/13/2024   CO2 22 02/13/2024      Assessment/Plan: Traumatic SDH/SAH Evaluated by neurosurgery-conservative/non operative management recommended Per last neurosurgery note on 5/23-repeat CT head on 5/27 to assess for resolution/evolution of his ICH.  Acute toxic metabolic encephalopathy Multifactorial etiology-SDH/seizures/AEDs Overall much improved-awake/alert this morning  Seizures Likely provoked from SDH. No further seizures LTM EEG negative Continue phenobarbital  and Vimpat . Needs patient follow-up with neurology.  Complicated E. coli UTI S/p Rocephin  x 5 days-last dose on 5/17  Fever Likely etiology upper extremity DVT-likely related to PICC line Blood cultures/UA/respiratory virus panel/CT chest all negative for any source of infection. Afebrile now-PICC line removed  Right upper extremity DVT Possible related to PICC line (now removed-5/23) Unable to anticoagulate given recent moderate SDH Appreciate neurosurgical input-okay for prophylactic anticoagulation but not appropriate for therapeutic anticoagulation.   Note discussed with spouse (retired [employer] fundraiser) at bedside on 5/24-she is okay with prophylactic anticoagulation-understands the risks of bleeding.  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  COPD Not in exacerbation Continue bronchodilators  Tobacco abuse Counseled  Nutrition Status: Nutrition  Problem: Increased nutrient needs Etiology: chronic illness (COPD) Signs/Symptoms: estimated needs Interventions: Ensure Enlive (each supplement provides 350kcal and 20 grams of protein), MVI    Pressure Ulcer: Pressure Injury 01/31/24 Coccyx Mid Deep Tissue Pressure Injury - Purple or maroon localized area of discolored intact skin or blood-filled blister due to damage of underlying soft tissue from pressure and/or shear. (Active)  01/31/24 0100  Location: Coccyx  Location Orientation: Mid  Staging: Deep Tissue Pressure Injury - Purple or maroon localized area of discolored intact skin or blood-filled blister due to damage of underlying soft tissue from pressure and/or shear.  Wound Description (Comments):   Present on Admission: Yes  Dressing Type Foam - Lift dressing to assess site every shift 02/15/24 2100   Class 1 obesity: Estimated body mass index is 29.39 kg/m as calculated from the following:   Height as of this encounter: 5\' 6"  (1.676 m).   Weight as of this encounter: 82.6 kg.   Code status:   Code Status: Full Code   DVT Prophylaxis: heparin  injection 5,000 Units Start: 02/15/24 1400 Place and maintain sequential compression device Start: 02/05/24 0657 SCDs Start: 01/30/24 2330   Family Communication: Spouse [name]-[phone] at bedside on 5/25   Disposition Plan: Status is: Inpatient Remains inpatient appropriate because: Severity of illness   Planned Discharge Destination:Skilled nursing facility   Diet: Diet Order             DIET DYS 3 Room service appropriate? Yes with Assist; Fluid consistency: Thin  Diet effective now                     Antimicrobial agents: Anti-infectives (From admission, onward)    Start     Dose/Rate Route Frequency Ordered Stop   02/04/24 1030  cefTRIAXone  (ROCEPHIN ) 1 g in sodium chloride  0.9 % 100 mL IVPB        1 g 200 mL/hr over 30 Minutes Intravenous Every 24 hours 02/04/24 0937 02/08/24 1257         MEDICATIONS: Scheduled Meds:  ALPRAZolam   0.5 mg Oral BID   arformoterol   15 mcg Nebulization BID   budesonide  (PULMICORT ) nebulizer solution  0.25 mg Nebulization BID   Chlorhexidine  Gluconate Cloth  6 each Topical Daily   feeding supplement  237 mL Oral BID BM   heparin  injection (subcutaneous)  5,000 Units Subcutaneous Q8H   insulin  aspart  0-9 Units Subcutaneous Q4H   lacosamide   200 mg Oral BID   levothyroxine   150 mcg Oral Q0600   metoprolol  tartrate  25 mg Oral BID   multivitamin with minerals  1 tablet Oral Daily   mouth rinse  15 mL Mouth Rinse 4 times per day   pantoprazole   40 mg Oral Daily   phenobarbital   64.8 mg Oral BID   revefenacin   175 mcg Nebulization Daily   sodium chloride  flush  10-40 mL Intracatheter Q12H   sodium chloride   1 g Oral BID   Continuous Infusions:   PRN Meds:.docusate sodium , haloperidol  lactate, hydrALAZINE , hydrALAZINE , labetalol , ondansetron  (ZOFRAN ) IV, polyethylene glycol, sodium chloride  flush, traMADol    I have personally reviewed following labs and imaging studies  LABORATORY DATA: CBC: Recent Labs  Lab 02/09/24 1227 02/10/24 0253 02/11/24 0325 02/12/24 0243 02/13/24 0215  WBC 11.1* 12.6* 11.1* 11.1* 9.7  HGB 7.9* 8.4* 8.1* 8.5* 7.9*  HCT 23.3* 24.9* 24.1* 25.6*  23.8*  MCV 94.0 93.6 93.8 95.5 96.0  PLT 268 293 310 315 282    Basic Metabolic Panel: Recent Labs  Lab 02/09/24 1227 02/10/24 0253 02/11/24 0325 02/12/24 0243 02/13/24 0215  NA 128* 131* 131* 132* 133*  K 3.7 3.8 3.8 3.8 3.6  CL 99 99 101 102 104  CO2 24 23 23 22 22   GLUCOSE 152* 146* 167* 126* 127*  BUN 21 20 18 20 20   CREATININE 0.52* 0.51* 0.55* 0.64 0.60*  CALCIUM  8.4* 8.7* 8.4* 8.6* 8.5*  MG 1.9 1.9 1.8 1.9 1.8  PHOS  --  3.5 3.2 3.8 3.6    GFR: Estimated Creatinine Clearance: 92.8 mL/min (A) (by C-G formula based on SCr of 0.6 mg/dL (L)).  Liver Function Tests: No results for input(s): "AST", "ALT", "ALKPHOS", "BILITOT", "PROT",  "ALBUMIN" in the last 168 hours.  No results for input(s): "LIPASE", "AMYLASE" in the last 168 hours. No results for input(s): "AMMONIA" in the last 168 hours.  Coagulation Profile: No results for input(s): "INR", "PROTIME" in the last 168 hours.  Cardiac Enzymes: No results for input(s): "CKTOTAL", "CKMB", "CKMBINDEX", "TROPONINI" in the last 168 hours.  BNP (last 3 results) No results for input(s): "PROBNP" in the last 8760 hours.  Lipid Profile: No results for input(s): "CHOL", "HDL", "LDLCALC", "TRIG", "CHOLHDL", "LDLDIRECT" in the last 72 hours.  Thyroid  Function Tests: No results for input(s): "TSH", "T4TOTAL", "FREET4", "T3FREE", "THYROIDAB" in the last 72 hours.  Anemia Panel: No results for input(s): "VITAMINB12", "FOLATE", "FERRITIN", "TIBC", "IRON", "RETICCTPCT" in the last 72 hours.  Urine analysis:    Component Value Date/Time   COLORURINE YELLOW 02/12/2024 1046   APPEARANCEUR CLEAR 02/12/2024 1046   LABSPEC 1.018 02/12/2024 1046   PHURINE 5.0 02/12/2024 1046   GLUCOSEU 50 (A) 02/12/2024 1046   HGBUR NEGATIVE 02/12/2024 1046   BILIRUBINUR NEGATIVE 02/12/2024 1046   KETONESUR NEGATIVE 02/12/2024 1046   PROTEINUR NEGATIVE 02/12/2024 1046   UROBILINOGEN 0.2 03/03/2014 1750   NITRITE NEGATIVE 02/12/2024 1046   LEUKOCYTESUR NEGATIVE 02/12/2024 1046    Sepsis Labs: Lactic Acid, Venous    Component Value Date/Time   LATICACIDVEN 2.1 (HH) 01/30/2024 2020    MICROBIOLOGY: Recent Results (from the past 240 hours)  Culture, blood (Routine X 2) w Reflex to ID Panel     Status: None   Collection Time: 02/09/24  2:51 PM   Specimen: BLOOD LEFT HAND  Result Value Ref Range Status   Specimen Description BLOOD LEFT HAND  Final   Special Requests   Final    BOTTLES DRAWN AEROBIC AND ANAEROBIC Blood Culture adequate volume   Culture   Final    NO GROWTH 5 DAYS Performed at [facility], [address], [place], [place] [address]    Report Status 02/14/2024  FINAL  Final  Culture, blood (Routine X 2) w Reflex to ID Panel     Status: None   Collection Time: 02/09/24  2:51 PM   Specimen: BLOOD LEFT ARM  Result Value Ref Range Status   Specimen Description BLOOD LEFT ARM  Final   Special Requests   Final    BOTTLES DRAWN AEROBIC AND ANAEROBIC Blood Culture adequate volume   Culture   Final    NO GROWTH 5 DAYS Performed at [facility], [address] [place], [place] [address]    Report Status 02/14/2024 FINAL  Final  Culture, blood (Routine X 2) w Reflex to ID Panel     Status: None (  Preliminary result)   Collection Time: 02/12/24 11:16 AM   Specimen: BLOOD LEFT HAND  Result Value Ref Range Status   Specimen Description BLOOD LEFT HAND  Final   Special Requests   Final    BOTTLES DRAWN AEROBIC AND ANAEROBIC Blood Culture results may not be optimal due to an inadequate volume of blood received in culture bottles   Culture   Final    NO GROWTH 4 DAYS Performed at [facility], [address], [place], [place] [address]    Report Status PENDING  Incomplete  Culture, blood (Routine X 2) w Reflex to ID Panel     Status: None (Preliminary result)   Collection Time: 02/12/24 11:18 AM   Specimen: BLOOD LEFT HAND  Result Value Ref Range Status   Specimen Description BLOOD LEFT HAND  Final   Special Requests   Final    BOTTLES DRAWN AEROBIC AND ANAEROBIC Blood Culture adequate volume   Culture   Final    NO GROWTH 4 DAYS Performed at [facility], [address] [place], [place] [address]    Report Status PENDING  Incomplete  Respiratory (~20 pathogens) panel by PCR     Status: None   Collection Time: 02/14/24  2:32 PM   Specimen: Nasopharyngeal Swab; Respiratory  Result Value Ref Range Status   Adenovirus NOT DETECTED NOT DETECTED Final   Coronavirus 229E NOT DETECTED NOT DETECTED Final    Comment: (NOTE) The Coronavirus on the Respiratory Panel, DOES NOT test for the novel  Coronavirus (2019 nCoV)    Coronavirus  HKU1 NOT DETECTED NOT DETECTED Final   Coronavirus NL63 NOT DETECTED NOT DETECTED Final   Coronavirus OC43 NOT DETECTED NOT DETECTED Final   Metapneumovirus NOT DETECTED NOT DETECTED Final   Rhinovirus / Enterovirus NOT DETECTED NOT DETECTED Final   Influenza A NOT DETECTED NOT DETECTED Final   Influenza B NOT DETECTED NOT DETECTED Final   Parainfluenza Virus 1 NOT DETECTED NOT DETECTED Final   Parainfluenza Virus 2 NOT DETECTED NOT DETECTED Final   Parainfluenza Virus 3 NOT DETECTED NOT DETECTED Final   Parainfluenza Virus 4 NOT DETECTED NOT DETECTED Final   Respiratory Syncytial Virus NOT DETECTED NOT DETECTED Final   Bordetella pertussis NOT DETECTED NOT DETECTED Final   Bordetella Parapertussis NOT DETECTED NOT DETECTED Final   Chlamydophila pneumoniae NOT DETECTED NOT DETECTED Final   Mycoplasma pneumoniae NOT DETECTED NOT DETECTED Final    Comment: Performed at [facility], [address], [place], [place] [address]    RADIOLOGY STUDIES/RESULTS: VAS US  UPPER EXTREMITY VENOUS DUPLEX Result Date: 02/15/2024 UPPER VENOUS STUDY  Patient Name:  [name]  Date of Exam:   02/14/2024 Medical Rec #: [id number]      Accession #:    [id number] Date of Birth: [birth year]      Patient Gender: M Patient Age:   6 years Exam Location:  [facility] Procedure:      VAS US  UPPER EXTREMITY VENOUS DUPLEX Referring Phys: TRUNG VU --------------------------------------------------------------------------------  Indications: FUO Risk Factors: Right PICC. Limitations: Poor ultrasound/tissue interface and patient positioning, patient immobility, patient guarding left arm. Comparison Study: No prior studies. Performing Technologist: Lerry Ransom RVT  Examination Guidelines: A complete evaluation includes B-mode imaging, spectral Doppler, color Doppler, and power Doppler as needed of all accessible portions of each vessel. Bilateral testing is considered an integral part of a complete examination.  Limited examinations for reoccurring indications may be performed as noted.  Right Findings: +----------+------------+---------+-----------+----------+-------+ RIGHT     CompressiblePhasicitySpontaneousPropertiesSummary +----------+------------+---------+-----------+----------+-------+ IJV           Full       Yes       Yes                      +----------+------------+---------+-----------+----------+-------+ Subclavian               Yes       Yes                      +----------+------------+---------+-----------+----------+-------+ Axillary      None       No        No                Acute  +----------+------------+---------+-----------+----------+-------+ Brachial    Partial      Yes       Yes               Acute  +----------+------------+---------+-----------+----------+-------+ Radial        Full                                          +----------+------------+---------+-----------+----------+-------+ Ulnar         Full                                          +----------+------------+---------+-----------+----------+-------+ Cephalic      Full                                          +----------+------------+---------+-----------+----------+-------+ Basilic       Full                                          +----------+------------+---------+-----------+----------+-------+  Left Findings: +----------+------------+---------+-----------+----------+--------------+ LEFT      CompressiblePhasicitySpontaneousProperties   Summary     +----------+------------+---------+-----------+----------+--------------+ IJV           Full       Yes       Yes                             +----------+------------+---------+-----------+----------+--------------+ Subclavian               Yes       Yes                             +----------+------------+---------+-----------+----------+--------------+ Axillary      Full       Yes       Yes                              +----------+------------+---------+-----------+----------+--------------+ Brachial  Not visualized +----------+------------+---------+-----------+----------+--------------+ Radial                                              Not visualized +----------+------------+---------+-----------+----------+--------------+ Ulnar                                               Not visualized +----------+------------+---------+-----------+----------+--------------+ Cephalic      None                                      Acute      +----------+------------+---------+-----------+----------+--------------+ Basilic       Full                                                 +----------+------------+---------+-----------+----------+--------------+ Thrombus located in the cephalic vein is noted to extend from the proximal upper arm into the distal upper arm. Unable to visualize cephalic vein in the [facility] fossa to determine involvement.  Summary:  Right: No evidence of superficial vein thrombosis in the upper extremity. Findings consistent with acute deep vein thrombosis involving the right axillary vein and right brachial veins.  Left: Findings consistent with acute superficial vein thrombosis involving the left cephalic vein.  *See table(s) above for measurements and observations.  Diagnosing physician: Irvin Mantel Electronically signed by Irvin Mantel on 02/15/2024 at 10:14:30 AM.    Final    VAS US  LOWER EXTREMITY VENOUS (DVT) Result Date: 02/15/2024  Lower Venous DVT Study Patient Name:  [name]  Date of Exam:   02/14/2024 Medical Rec #: [id number]      Accession #:    [id number] Date of Birth: Mar 01, 1961      Patient Gender: M Patient Age:   81 years Exam Location:  [facility] Procedure:      VAS US  LOWER EXTREMITY VENOUS (DVT) Referring Phys: Kimberly Penna  --------------------------------------------------------------------------------  Indications: Swelling, and fever.  Comparison Study: No priors. Performing Technologist: Franky Ivanoff Sturdivant-Jones RDMS, RVT  Examination Guidelines: A complete evaluation includes B-mode imaging, spectral Doppler, color Doppler, and power Doppler as needed of all accessible portions of each vessel. Bilateral testing is considered an integral part of a complete examination. Limited examinations for reoccurring indications may be performed as noted. The reflux portion of the exam is performed with the patient in reverse Trendelenburg.  +---------+---------------+---------+-----------+----------+--------------+ RIGHT    CompressibilityPhasicitySpontaneityPropertiesThrombus Aging +---------+---------------+---------+-----------+----------+--------------+ CFV      Full           Yes      Yes                                 +---------+---------------+---------+-----------+----------+--------------+ SFJ      Full                                                        +---------+---------------+---------+-----------+----------+--------------+  FV Prox  Full                                                        +---------+---------------+---------+-----------+----------+--------------+ FV Mid   Full                                                        +---------+---------------+---------+-----------+----------+--------------+ FV DistalFull                                                        +---------+---------------+---------+-----------+----------+--------------+ PFV      Full                                                        +---------+---------------+---------+-----------+----------+--------------+ POP      Full           Yes      Yes                                 +---------+---------------+---------+-----------+----------+--------------+ PTV      Full                                                         +---------+---------------+---------+-----------+----------+--------------+ PERO     Full                                                        +---------+---------------+---------+-----------+----------+--------------+   +---------+---------------+---------+-----------+----------+--------------+ LEFT     CompressibilityPhasicitySpontaneityPropertiesThrombus Aging +---------+---------------+---------+-----------+----------+--------------+ CFV      Full           Yes      Yes                                 +---------+---------------+---------+-----------+----------+--------------+ SFJ      Full                                                        +---------+---------------+---------+-----------+----------+--------------+ FV Prox  Full                                                        +---------+---------------+---------+-----------+----------+--------------+  FV Mid   Full                                                        +---------+---------------+---------+-----------+----------+--------------+ FV DistalFull                                                        +---------+---------------+---------+-----------+----------+--------------+ PFV      Full                                                        +---------+---------------+---------+-----------+----------+--------------+ POP      Full           Yes      Yes                                 +---------+---------------+---------+-----------+----------+--------------+ PTV      Full                                                        +---------+---------------+---------+-----------+----------+--------------+ PERO     Full                                                        +---------+---------------+---------+-----------+----------+--------------+     Summary: BILATERAL: - No evidence of deep vein thrombosis seen in the lower  extremities, bilaterally. -No evidence of popliteal cyst, bilaterally.   *See table(s) above for measurements and observations. Electronically signed by Irvin Mantel on 02/15/2024 at 10:13:08 AM.    Final      LOS: 17 days   Kimberly Penna, MD  [facility]    To contact the attending provider between 7A-7P or the covering provider during after hours 7P-7A, please log into the web site [email or web address] and access using universal [place] password for that web site. If you do not have the password, please call the hospital operator.  02/16/2024, 10:09 AM

## 2024-02-17 DIAGNOSIS — S065XAA Traumatic subdural hemorrhage with loss of consciousness status unknown, initial encounter: Secondary | ICD-10-CM | POA: Diagnosis not present

## 2024-02-17 DIAGNOSIS — D649 Anemia, unspecified: Secondary | ICD-10-CM | POA: Diagnosis not present

## 2024-02-17 DIAGNOSIS — D696 Thrombocytopenia, unspecified: Secondary | ICD-10-CM | POA: Diagnosis not present

## 2024-02-17 DIAGNOSIS — Z79899 Other long term (current) drug therapy: Secondary | ICD-10-CM | POA: Diagnosis not present

## 2024-02-17 LAB — CBC
HCT: 27.8 % — ABNORMAL LOW (ref 39.0–52.0)
Hemoglobin: 9.3 g/dL — ABNORMAL LOW (ref 13.0–17.0)
MCH: 32.3 pg (ref 26.0–34.0)
MCHC: 33.5 g/dL (ref 30.0–36.0)
MCV: 96.5 fL (ref 80.0–100.0)
Platelets: 303 10*3/uL (ref 150–400)
RBC: 2.88 MIL/uL — ABNORMAL LOW (ref 4.22–5.81)
RDW: 18.2 % — ABNORMAL HIGH (ref 11.5–15.5)
WBC: 6 10*3/uL (ref 4.0–10.5)
nRBC: 0 % (ref 0.0–0.2)

## 2024-02-17 LAB — CULTURE, BLOOD (ROUTINE X 2)
Culture: NO GROWTH
Culture: NO GROWTH
Special Requests: ADEQUATE

## 2024-02-17 LAB — GLUCOSE, CAPILLARY
Glucose-Capillary: 135 mg/dL — ABNORMAL HIGH (ref 70–99)
Glucose-Capillary: 121 mg/dL — ABNORMAL HIGH (ref 70–99)
Glucose-Capillary: 160 mg/dL — ABNORMAL HIGH (ref 70–99)
Glucose-Capillary: 109 mg/dL — ABNORMAL HIGH (ref 70–99)
Glucose-Capillary: 271 mg/dL — ABNORMAL HIGH (ref 70–99)
Glucose-Capillary: 155 mg/dL — ABNORMAL HIGH (ref 70–99)

## 2024-02-17 NOTE — Progress Notes (Signed)
 PROGRESS NOTE        PATIENT DETAILS Name: [name] Age: 63 y.o. Sex: male Date of Birth: 01-29-1961 Admit Date: 01/30/2024 Admitting Physician Lind Repine, MD [id number], Moises Ang, MD  Brief Summary: Patient is a 63 y.o.  male with history of COPD, DM-2, hypothyroidism, anxiety disorder-presented with a fall-found to have subdural hematoma and seizures.  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  Scattered SAH. 5/8>> CT C-spine: No fracture/traumatic malalignment 5/9>> CT head: Slight increased size of SDH.  Small volume SAH. 5/13>> CT head: Resolved left SDH, increased right SDH-11 mm.  Small volume SAH. 5/21>>CXR: No obvious pneumonia 5/22>> CT chest: No PNA 5/23>> B/L lower extremity Doppler: No DVT 5/23>> B/L upper extremity Doppler: DVT right axillary/right brachial vein, SVT left cephalic vein  Significant microbiology data: 5/8>> COVID/influenza/RSV PCR: Negative 5/8>> blood culture: Negative 5/12>> urine culture: E. coli 5/18>> blood cultures: No growth 5/21>> blood culture: No growth 5/23>> respiratory virus panel: Negative  Procedures: None  Consults: PCCM Neurology Neurosurgery Infectious disease  Subjective: Afebrile for almost 2 days-no other complaints.  Unchanged for the past several days.  Objective: Vitals: Blood pressure (!) 141/76, pulse 88, temperature 98.1 F (36.7 C), temperature source Oral, resp. rate 13, height 5\' 6"  (1.676 m), weight 82 kg, SpO2 97%.   Exam: Awake/alert Frail appearing Abdomen: Soft nontender nondistended Nonfocal exam but with generalized weakness.  Pertinent Labs/Radiology:    Latest Ref Rng & Units 02/17/2024    4:19 AM 02/13/2024     2:15 AM 02/12/2024    2:43 AM  CBC  WBC 4.0 - 10.5 K/uL 6.0  9.7  11.1   Hemoglobin 13.0 - 17.0 g/dL 9.3  7.9  8.5   Hematocrit 39.0 - 52.0 % 27.8  23.8  25.6   Platelets 150 - 400 K/uL 303  282  315     Lab Results  Component Value Date   NA 133 (L) 02/13/2024   K 3.6 02/13/2024   CL 104 02/13/2024   CO2 22 02/13/2024      Assessment/Plan: Traumatic SDH/SAH Evaluated by neurosurgery-conservative/non operative management recommended Per last neurosurgery note on 5/23-repeat CT head on 5/27 to assess for resolution/evolution of his ICH.  Acute toxic metabolic encephalopathy Multifactorial etiology-SDH/seizures/AEDs Overall much improved-awake/alert this morning  Seizures Likely provoked from SDH. No further seizures LTM EEG negative Continue phenobarbital  and Vimpat . Needs patient follow-up with neurology.  Complicated E. coli UTI S/p Rocephin  x 5 days-last dose on 5/17  Fever Likely etiology upper extremity DVT-likely related to PICC line Blood cultures/UA/respiratory virus panel/CT chest all negative for any source of infection. Afebrile now-PICC line removed  Right upper extremity DVT Possible related to PICC line (now removed-5/23) Unable to anticoagulate given recent moderate SDH Appreciate neurosurgical input-okay for prophylactic anticoagulation but not appropriate for therapeutic anticoagulation.   Note discussed with spouse (retired [employer] fundraiser) at bedside on 5/24-she is okay with prophylactic anticoagulation-understands the risks of bleeding.  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  COPD Not  in exacerbation Continue bronchodilators  Tobacco abuse Counseled  Nutrition Status: Nutrition Problem: Increased nutrient needs Etiology: chronic illness (COPD) Signs/Symptoms: estimated  needs Interventions: Ensure Enlive (each supplement provides 350kcal and 20 grams of protein), MVI    Pressure Ulcer: Pressure Injury 01/31/24 Coccyx Mid Deep Tissue Pressure Injury - Purple or maroon localized area of discolored intact skin or blood-filled blister due to damage of underlying soft tissue from pressure and/or shear. (Active)  01/31/24 0100  Location: Coccyx  Location Orientation: Mid  Staging: Deep Tissue Pressure Injury - Purple or maroon localized area of discolored intact skin or blood-filled blister due to damage of underlying soft tissue from pressure and/or shear.  Wound Description (Comments):   Present on Admission: Yes  Dressing Type Foam - Lift dressing to assess site every shift 02/16/24 1954   Class 1 obesity: Estimated body mass index is 29.18 kg/m as calculated from the following:   Height as of this encounter: 5\' 6"  (1.676 m).   Weight as of this encounter: 82 kg.   Code status:   Code Status: Full Code   DVT Prophylaxis: heparin  injection 5,000 Units Start: 02/15/24 1400 Place and maintain sequential compression device Start: 02/05/24 0657 SCDs Start: 01/30/24 2330   Family Communication: Spouse [name]-[phone] at bedside on 5/25   Disposition Plan: Status is: Inpatient Remains inpatient appropriate because: Severity of illness   Planned Discharge Destination:Skilled nursing facility   Diet: Diet Order             DIET DYS 3 Room service appropriate? Yes with Assist; Fluid consistency: Thin  Diet effective now                     Antimicrobial agents: Anti-infectives (From admission, onward)    Start     Dose/Rate Route Frequency Ordered Stop   02/04/24 1030  cefTRIAXone  (ROCEPHIN ) 1 g in sodium chloride  0.9 % 100 mL IVPB        1 g 200 mL/hr over 30 Minutes Intravenous Every 24 hours 02/04/24 0937 02/08/24 1257        MEDICATIONS: Scheduled Meds:  ALPRAZolam   0.5 mg Oral BID   arformoterol   15 mcg Nebulization  BID   budesonide  (PULMICORT ) nebulizer solution  0.25 mg Nebulization BID   Chlorhexidine  Gluconate Cloth  6 each Topical Daily   feeding supplement  237 mL Oral BID BM   heparin  injection (subcutaneous)  5,000 Units Subcutaneous Q8H   insulin  aspart  0-9 Units Subcutaneous Q4H   lacosamide   200 mg Oral BID   levothyroxine   150 mcg Oral Q0600   metoprolol  tartrate  25 mg Oral BID   multivitamin with minerals  1 tablet Oral Daily   mouth rinse  15 mL Mouth Rinse 4 times per day   pantoprazole   40 mg Oral Daily   phenobarbital   64.8 mg Oral BID   revefenacin   175 mcg Nebulization Daily   sodium chloride  flush  10-40 mL Intracatheter Q12H   sodium chloride   1 g Oral BID   Continuous Infusions:   PRN Meds:.docusate sodium , haloperidol  lactate, hydrALAZINE , hydrALAZINE , labetalol , ondansetron  (ZOFRAN ) IV, polyethylene glycol, sodium chloride  flush, traMADol    I have personally reviewed following labs and imaging studies  LABORATORY DATA: CBC: Recent Labs  Lab 02/11/24 0325 02/12/24 0243 02/13/24 0215 02/17/24 0419  WBC 11.1* 11.1* 9.7 6.0  HGB 8.1* 8.5* 7.9* 9.3*  HCT 24.1* 25.6* 23.8* 27.8*  MCV 93.8 95.5 96.0 96.5  PLT 310 315 282 303  Basic Metabolic Panel: Recent Labs  Lab 02/11/24 0325 02/12/24 0243 02/13/24 0215  NA 131* 132* 133*  K 3.8 3.8 3.6  CL 101 102 104  CO2 23 22 22   GLUCOSE 167* 126* 127*  BUN 18 20 20   CREATININE 0.55* 0.64 0.60*  CALCIUM  8.4* 8.6* 8.5*  MG 1.8 1.9 1.8  PHOS 3.2 3.8 3.6    GFR: Estimated Creatinine Clearance: 92.6 mL/min (A) (by C-G formula based on SCr of 0.6 mg/dL (L)).  Liver Function Tests: No results for input(s): "AST", "ALT", "ALKPHOS", "BILITOT", "PROT", "ALBUMIN" in the last 168 hours.  No results for input(s): "LIPASE", "AMYLASE" in the last 168 hours. No results for input(s): "AMMONIA" in the last 168 hours.  Coagulation Profile: No results for input(s): "INR", "PROTIME" in the last 168 hours.  Cardiac  Enzymes: No results for input(s): "CKTOTAL", "CKMB", "CKMBINDEX", "TROPONINI" in the last 168 hours.  BNP (last 3 results) No results for input(s): "PROBNP" in the last 8760 hours.  Lipid Profile: No results for input(s): "CHOL", "HDL", "LDLCALC", "TRIG", "CHOLHDL", "LDLDIRECT" in the last 72 hours.  Thyroid  Function Tests: No results for input(s): "TSH", "T4TOTAL", "FREET4", "T3FREE", "THYROIDAB" in the last 72 hours.  Anemia Panel: No results for input(s): "VITAMINB12", "FOLATE", "FERRITIN", "TIBC", "IRON", "RETICCTPCT" in the last 72 hours.  Urine analysis:    Component Value Date/Time   COLORURINE YELLOW 02/12/2024 1046   APPEARANCEUR CLEAR 02/12/2024 1046   LABSPEC 1.018 02/12/2024 1046   PHURINE 5.0 02/12/2024 1046   GLUCOSEU 50 (A) 02/12/2024 1046   HGBUR NEGATIVE 02/12/2024 1046   BILIRUBINUR NEGATIVE 02/12/2024 1046   KETONESUR NEGATIVE 02/12/2024 1046   PROTEINUR NEGATIVE 02/12/2024 1046   UROBILINOGEN 0.2 03/03/2014 1750   NITRITE NEGATIVE 02/12/2024 1046   LEUKOCYTESUR NEGATIVE 02/12/2024 1046    Sepsis Labs: Lactic Acid, Venous    Component Value Date/Time   LATICACIDVEN 2.1 (HH) 01/30/2024 2020    MICROBIOLOGY: Recent Results (from the past 240 hours)  Culture, blood (Routine X 2) w Reflex to ID Panel     Status: None   Collection Time: 02/09/24  2:51 PM   Specimen: BLOOD LEFT HAND  Result Value Ref Range Status   Specimen Description BLOOD LEFT HAND  Final   Special Requests   Final    BOTTLES DRAWN AEROBIC AND ANAEROBIC Blood Culture adequate volume   Culture   Final    NO GROWTH 5 DAYS Performed at [facility], [address], [place], [place] [address]    Report Status 02/14/2024 FINAL  Final  Culture, blood (Routine X 2) w Reflex to ID Panel     Status: None   Collection Time: 02/09/24  2:51 PM   Specimen: BLOOD LEFT ARM  Result Value Ref Range Status   Specimen Description BLOOD LEFT ARM  Final   Special Requests   Final    BOTTLES  DRAWN AEROBIC AND ANAEROBIC Blood Culture adequate volume   Culture   Final    NO GROWTH 5 DAYS Performed at [facility], [address] [place], [place] [address]    Report Status 02/14/2024 FINAL  Final  Culture, blood (Routine X 2) w Reflex to ID Panel     Status: None   Collection Time: 02/12/24 11:16 AM   Specimen: BLOOD LEFT HAND  Result Value Ref Range Status   Specimen Description BLOOD LEFT HAND  Final   Special Requests   Final    BOTTLES DRAWN AEROBIC AND ANAEROBIC Blood  Culture results may not be optimal due to an inadequate volume of blood received in culture bottles   Culture   Final    NO GROWTH 5 DAYS Performed at [facility], [address], [place], [place] [address]    Report Status 02/17/2024 FINAL  Final  Culture, blood (Routine X 2) w Reflex to ID Panel     Status: None   Collection Time: 02/12/24 11:18 AM   Specimen: BLOOD LEFT HAND  Result Value Ref Range Status   Specimen Description BLOOD LEFT HAND  Final   Special Requests   Final    BOTTLES DRAWN AEROBIC AND ANAEROBIC Blood Culture adequate volume   Culture   Final    NO GROWTH 5 DAYS Performed at [facility], [address], [place], [place] [address]    Report Status 02/17/2024 FINAL  Final  Respiratory (~20 pathogens) panel by PCR     Status: None   Collection Time: 02/14/24  2:32 PM   Specimen: Nasopharyngeal Swab; Respiratory  Result Value Ref Range Status   Adenovirus NOT DETECTED NOT DETECTED Final   Coronavirus 229E NOT DETECTED NOT DETECTED Final    Comment: (NOTE) The Coronavirus on the Respiratory Panel, DOES NOT test for the novel  Coronavirus (2019 nCoV)    Coronavirus HKU1 NOT DETECTED NOT DETECTED Final   Coronavirus NL63 NOT DETECTED NOT DETECTED Final   Coronavirus OC43 NOT DETECTED NOT DETECTED Final   Metapneumovirus NOT DETECTED NOT DETECTED Final   Rhinovirus / Enterovirus NOT DETECTED NOT DETECTED Final   Influenza A NOT DETECTED NOT DETECTED Final    Influenza B NOT DETECTED NOT DETECTED Final   Parainfluenza Virus 1 NOT DETECTED NOT DETECTED Final   Parainfluenza Virus 2 NOT DETECTED NOT DETECTED Final   Parainfluenza Virus 3 NOT DETECTED NOT DETECTED Final   Parainfluenza Virus 4 NOT DETECTED NOT DETECTED Final   Respiratory Syncytial Virus NOT DETECTED NOT DETECTED Final   Bordetella pertussis NOT DETECTED NOT DETECTED Final   Bordetella Parapertussis NOT DETECTED NOT DETECTED Final   Chlamydophila pneumoniae NOT DETECTED NOT DETECTED Final   Mycoplasma pneumoniae NOT DETECTED NOT DETECTED Final    Comment: Performed at [facility], [address] [place], [place] [address]    RADIOLOGY STUDIES/RESULTS: No results found.    LOS: 18 days   Kimberly Penna, MD  [facility]    To contact the attending provider between 7A-7P or the covering provider during after hours 7P-7A, please log into the web site [email or web address] and access using universal [place] password for that web site. If you do not have the password, please call the hospital operator.  02/17/2024, 10:43 AM

## 2024-02-17 NOTE — Plan of Care (Signed)
 Id brief note  Since picc removed. Fever resolved Dvt related it appears    Id will sign off

## 2024-02-17 NOTE — Progress Notes (Signed)
 Physical Therapy Treatment Patient Details Name: [name] MRN: [id number] DOB: 1961/08/08 Today's Date: 02/17/2024   History of Present Illness Pt is a 63 y.o. male presenting 5/8 after a fall at home. UDS positive for benzodiazepines. EKG with sinus bradycardia. Head CT 5/8 showed 9 mm right subdural hematoma with 3 mm midline shift and small volume acute SAH.  Pt on continuous EEG 5/14 with continued seizures. 5/13 CT head: Resolved left SDH, increased right SDH-11 mm. Small volume SAH. PMH significant for seizures and COPD, smoker.    PT Comments  Worked on bed mobility with mod assist for rolling left and right, using rail. Transferred to recliner with maximove. Emphasized techniques for seated balance. Still leaning posteriorly, and initially with heavy leftward lean. Able to reposition in chair. Tolerated LE exercises well. Patient will continue to benefit from skilled physical therapy services to further improve independence with functional mobility.     If plan is discharge home, recommend the following: Two people to help with walking and/or transfers;Two people to help with bathing/dressing/bathroom;Assist for transportation;Direct supervision/assist for financial management;Direct supervision/assist for medications management;Assistance with feeding;Assistance with cooking/housework;Help with stairs or ramp for entrance;Supervision due to cognitive status   Can travel by private vehicle     No  Equipment Recommendations  Other (comment) (defer to next venue)    Recommendations for Other Services       Precautions / Restrictions Precautions Precautions: Fall Recall of Precautions/Restrictions: Impaired Precaution/Restrictions Comments: NG tube Restrictions Weight Bearing Restrictions Per Provider Order: No     Mobility  Bed Mobility Overal bed mobility: Needs Assistance Bed Mobility: Rolling Rolling: Mod assist, Used rails         General bed mobility comments:  Mod assist to roll left and right in bed. Hand over hand assist to grasp rail (unable visualize.) Pulls and pushes through thorough UEs and LEs respectively to assist with roll. Sustains grip on rail.  Mod assist to achieve full side lying position.    Transfers                        Ambulation/Gait                   Stairs             Wheelchair Mobility     Tilt Bed    Modified Rankin (Stroke Patients Only) Modified Rankin (Stroke Patients Only) Pre-Morbid Rankin Score: Moderate disability Modified Rankin: Severe disability     Balance Overall balance assessment: Needs assistance Sitting-balance support: Feet supported, Single extremity supported Sitting balance-Leahy Scale: Zero Sitting balance - Comments: Leftward and posterior lean, minimal righting reactions. Seated in chair. Postural control: Posterior lean, Left lateral lean                                  Communication Communication Communication: No apparent difficulties  Cognition Arousal: Alert Behavior During Therapy: Flat affect   PT - Cognitive impairments: Orientation, Awareness, Memory, Sequencing, Problem solving, Safety/Judgement, Attention, Initiation   Orientation impairments: Time, Situation                   PT - Cognition Comments: Oriented to self, location, and year. Unsure of month. States it is fall when told that the month is May. Per spouse he does have baseline visual deficit/glaucoma. Following commands: Impaired Following commands impaired: Follows one step commands with  increased time, Follows one step commands inconsistently    Cueing Cueing Techniques: Verbal cues, Tactile cues, Gestural cues  Exercises General Exercises - Lower Extremity Ankle Circles/Pumps: AROM, Both, Supine, 10 reps, AAROM Quad Sets: AROM, Both, 10 reps, Strengthening, Seated    General Comments General comments (skin integrity, edema, etc.): Wifre present,  supportive.      Pertinent Vitals/Pain Pain Assessment Pain Assessment: Faces Faces Pain Scale: Hurts even more Pain Location: "all over" and "headache" Pain Descriptors / Indicators: Aching, Sore Pain Intervention(s): Limited activity within patient's tolerance, Monitored during session, Repositioned    Home Living                          Prior Function            PT Goals (current goals can now be found in the care plan section) Acute Rehab PT Goals Patient Stated Goal: none stated PT Goal Formulation: With patient/family Time For Goal Achievement: 02/21/24 Potential to Achieve Goals: Fair Progress towards PT goals: Progressing toward goals    Frequency    Min 2X/week      PT Plan      Co-evaluation              AM-PAC PT "6 Clicks" Mobility   Outcome Measure  Help needed turning from your back to your side while in a flat bed without using bedrails?: A Lot Help needed moving from lying on your back to sitting on the side of a flat bed without using bedrails?: Total Help needed moving to and from a bed to a chair (including a wheelchair)?: Total Help needed standing up from a chair using your arms (e.g., wheelchair or bedside chair)?: Total Help needed to walk in hospital room?: Total Help needed climbing 3-5 steps with a railing? : Total 6 Click Score: 7    End of Session Equipment Utilized During Treatment: Gait belt Activity Tolerance: Patient tolerated treatment well Patient left: with family/visitor present;in chair;with call bell/phone within reach;with chair alarm set (pillow under bottom) Nurse Communication: Mobility status;Need for lift equipment (limit 1.5 hr in chair at a time for pressure relief.) PT Visit Diagnosis: Unsteadiness on feet (R26.81);Muscle weakness (generalized) (M62.81);History of falling (Z91.81);Difficulty in walking, not elsewhere classified (R26.2);Other symptoms and signs involving the nervous system (R29.898)      Time: 1610-9604 PT Time Calculation (min) (ACUTE ONLY): 28 min  Charges:    $Therapeutic Activity: 23-37 mins PT General Charges $$ ACUTE PT VISIT: 1 Visit                     Jory Ng, PT, DPT [facility]  Rehabilitation Services Physical Therapist Office: [phone] Website: [email or web address]    Alinda Irani 02/17/2024, 10:34 AM

## 2024-02-18 ENCOUNTER — Encounter (HOSPITAL_COMMUNITY)
Admission: EM | Disposition: A | Discharge status: Skilled Nursing Facility | Payer: Self-pay | Source: Home / Self Care | Attending: Internal Medicine

## 2024-02-18 ENCOUNTER — Inpatient Hospital Stay (HOSPITAL_COMMUNITY): Payer: Medicare (Managed Care)

## 2024-02-18 ENCOUNTER — Other Ambulatory Visit: Payer: Self-pay

## 2024-02-18 ENCOUNTER — Encounter (HOSPITAL_COMMUNITY): Payer: Self-pay | Admitting: Pulmonary Disease

## 2024-02-18 DIAGNOSIS — Z79899 Other long term (current) drug therapy: Secondary | ICD-10-CM | POA: Diagnosis not present

## 2024-02-18 DIAGNOSIS — S065XAA Traumatic subdural hemorrhage with loss of consciousness status unknown, initial encounter: Secondary | ICD-10-CM | POA: Diagnosis not present

## 2024-02-18 DIAGNOSIS — I82621 Acute embolism and thrombosis of deep veins of right upper extremity: Secondary | ICD-10-CM

## 2024-02-18 DIAGNOSIS — J449 Chronic obstructive pulmonary disease, unspecified: Secondary | ICD-10-CM

## 2024-02-18 DIAGNOSIS — I6200 Nontraumatic subdural hemorrhage, unspecified: Secondary | ICD-10-CM | POA: Diagnosis not present

## 2024-02-18 DIAGNOSIS — Z720 Tobacco use: Secondary | ICD-10-CM

## 2024-02-18 DIAGNOSIS — F17210 Nicotine dependence, cigarettes, uncomplicated: Secondary | ICD-10-CM | POA: Diagnosis not present

## 2024-02-18 DIAGNOSIS — I10 Essential (primary) hypertension: Secondary | ICD-10-CM

## 2024-02-18 DIAGNOSIS — E035 Myxedema coma: Secondary | ICD-10-CM | POA: Diagnosis not present

## 2024-02-18 DIAGNOSIS — D649 Anemia, unspecified: Secondary | ICD-10-CM | POA: Diagnosis not present

## 2024-02-18 HISTORY — PX: CRANIOTOMY: SHX93

## 2024-02-18 LAB — GLUCOSE, CAPILLARY
Glucose-Capillary: 148 mg/dL — ABNORMAL HIGH (ref 70–99)
Glucose-Capillary: 137 mg/dL — ABNORMAL HIGH (ref 70–99)
Glucose-Capillary: 185 mg/dL — ABNORMAL HIGH (ref 70–99)
Glucose-Capillary: 125 mg/dL — ABNORMAL HIGH (ref 70–99)
Glucose-Capillary: 111 mg/dL — ABNORMAL HIGH (ref 70–99)

## 2024-02-18 LAB — TYPE AND SCREEN
ABO/RH(D): A NEG
Antibody Screen: NEGATIVE

## 2024-02-18 LAB — ABO/RH: ABO/RH(D): A NEG

## 2024-02-18 SURGERY — CRANIOTOMY HEMATOMA EVACUATION SUBDURAL
Anesthesia: General | Laterality: Right

## 2024-02-18 MED ORDER — FENTANYL CITRATE (PF) 100 MCG/2ML IJ SOLN
25.0000 ug | INTRAMUSCULAR | Status: DC | PRN
  Administered 2024-02-18: 50 ug via INTRAVENOUS

## 2024-02-18 MED ORDER — PHENOBARBITAL SODIUM 65 MG/ML IJ SOLN
65.0000 mg | Freq: Two times a day (BID) | INTRAMUSCULAR | Status: DC
  Administered 2024-02-18 – 2024-02-19 (×2): 65 mg via INTRAVENOUS
  Filled 2024-02-18 (×2): qty 1

## 2024-02-18 MED ORDER — HEMOSTATIC AGENTS (NO CHARGE) OPTIME
TOPICAL | Status: DC | PRN
  Administered 2024-02-18: 1 via TOPICAL

## 2024-02-18 MED ORDER — INSULIN ASPART 100 UNIT/ML IJ SOLN
0.0000 [IU] | Freq: Three times a day (TID) | INTRAMUSCULAR | Status: DC
  Filled 2024-02-18: qty 1

## 2024-02-18 MED ORDER — ROCURONIUM BROMIDE 10 MG/ML (PF) SYRINGE
PREFILLED_SYRINGE | INTRAVENOUS | Status: AC
  Filled 2024-02-18: qty 10

## 2024-02-18 MED ORDER — LIDOCAINE 2% (20 MG/ML) 5 ML SYRINGE
INTRAMUSCULAR | Status: AC
  Filled 2024-02-18: qty 5

## 2024-02-18 MED ORDER — FENTANYL CITRATE (PF) 250 MCG/5ML IJ SOLN
INTRAMUSCULAR | Status: DC | PRN
  Administered 2024-02-18: 50 ug via INTRAVENOUS

## 2024-02-18 MED ORDER — SODIUM CHLORIDE 0.9 % IV SOLN: INTRAVENOUS | Status: AC

## 2024-02-18 MED ORDER — THROMBIN 20000 UNITS EX SOLR
CUTANEOUS | Status: AC
  Filled 2024-02-18: qty 20000

## 2024-02-18 MED ORDER — CEFAZOLIN SODIUM-DEXTROSE 2-4 GM/100ML-% IV SOLN
2.0000 g | INTRAVENOUS | Status: AC
  Administered 2024-02-18: 2 g via INTRAVENOUS
  Filled 2024-02-18: qty 100

## 2024-02-18 MED ORDER — SODIUM CHLORIDE 0.9 % IV SOLN
200.0000 mg | Freq: Two times a day (BID) | INTRAVENOUS | Status: DC
  Administered 2024-02-18 – 2024-02-19 (×2): 200 mg via INTRAVENOUS
  Filled 2024-02-18 (×3): qty 20

## 2024-02-18 MED ORDER — INSULIN ASPART 100 UNIT/ML IJ SOLN
0.0000 [IU] | INTRAMUSCULAR | Status: DC | PRN
  Administered 2024-02-18: 4 [IU] via SUBCUTANEOUS

## 2024-02-18 MED ORDER — PROPOFOL 10 MG/ML IV BOLUS
INTRAVENOUS | Status: AC
Start: 2024-02-18 — End: ?
  Filled 2024-02-18: qty 20

## 2024-02-18 MED ORDER — ONDANSETRON HCL 4 MG/2ML IJ SOLN
INTRAMUSCULAR | Status: AC
  Filled 2024-02-18: qty 2

## 2024-02-18 MED ORDER — FENTANYL CITRATE (PF) 250 MCG/5ML IJ SOLN
INTRAMUSCULAR | Status: AC
  Filled 2024-02-18: qty 5

## 2024-02-18 MED ORDER — LIDOCAINE 2% (20 MG/ML) 5 ML SYRINGE
INTRAMUSCULAR | Status: DC | PRN
  Administered 2024-02-18: 100 mg via INTRAVENOUS

## 2024-02-18 MED ORDER — PROPOFOL 10 MG/ML IV BOLUS
INTRAVENOUS | Status: DC | PRN
  Administered 2024-02-18: 120 mg via INTRAVENOUS

## 2024-02-18 MED ORDER — LIDOCAINE-EPINEPHRINE 1 %-1:100000 IJ SOLN
INTRAMUSCULAR | Status: DC | PRN
Start: 2024-02-18 — End: 2024-02-18
  Administered 2024-02-18: 6 mL

## 2024-02-18 MED ORDER — THROMBIN 20000 UNITS EX SOLR
CUTANEOUS | Status: DC | PRN
  Administered 2024-02-18: 20 mL via TOPICAL

## 2024-02-18 MED ORDER — LACTATED RINGERS IV SOLN: INTRAVENOUS | Status: DC | PRN

## 2024-02-18 MED ORDER — METOPROLOL TARTRATE 5 MG/5ML IV SOLN
5.0000 mg | Freq: Four times a day (QID) | INTRAVENOUS | Status: DC
  Administered 2024-02-19 (×2): 5 mg via INTRAVENOUS
  Filled 2024-02-18 (×2): qty 5

## 2024-02-18 MED ORDER — LIDOCAINE-EPINEPHRINE 1 %-1:100000 IJ SOLN
INTRAMUSCULAR | Status: AC
  Filled 2024-02-18: qty 1

## 2024-02-18 MED ORDER — THROMBIN 5000 UNITS EX SOLR
OROMUCOSAL | Status: DC | PRN
  Administered 2024-02-18: 5 mL via TOPICAL

## 2024-02-18 MED ORDER — PHENYLEPHRINE 80 MCG/ML (10ML) SYRINGE FOR IV PUSH (FOR BLOOD PRESSURE SUPPORT)
PREFILLED_SYRINGE | INTRAVENOUS | Status: AC
  Filled 2024-02-18: qty 10

## 2024-02-18 MED ORDER — FENTANYL CITRATE (PF) 100 MCG/2ML IJ SOLN
INTRAMUSCULAR | Status: AC
  Filled 2024-02-18: qty 2

## 2024-02-18 MED ORDER — DEXAMETHASONE SODIUM PHOSPHATE 10 MG/ML IJ SOLN
INTRAMUSCULAR | Status: AC
  Filled 2024-02-18: qty 1

## 2024-02-18 MED ORDER — 0.9 % SODIUM CHLORIDE (POUR BTL) OPTIME
TOPICAL | Status: DC | PRN
  Administered 2024-02-18: 3000 mL

## 2024-02-18 MED ORDER — CHLORHEXIDINE GLUCONATE 0.12 % MT SOLN
15.0000 mL | Freq: Once | OROMUCOSAL | Status: AC
  Administered 2024-02-18: 15 mL via OROMUCOSAL

## 2024-02-18 MED ORDER — LABETALOL HCL 5 MG/ML IV SOLN
INTRAVENOUS | Status: AC
  Filled 2024-02-18: qty 4

## 2024-02-18 MED ORDER — PHENYLEPHRINE HCL-NACL 20-0.9 MG/250ML-% IV SOLN
INTRAVENOUS | Status: DC | PRN
  Administered 2024-02-18: 30 ug/min via INTRAVENOUS

## 2024-02-18 MED ORDER — THROMBIN 5000 UNITS EX KIT
PACK | CUTANEOUS | Status: AC
  Filled 2024-02-18: qty 1

## 2024-02-18 MED ORDER — LABETALOL HCL 5 MG/ML IV SOLN
5.0000 mg | INTRAVENOUS | Status: DC | PRN
  Administered 2024-02-18: 5 mg via INTRAVENOUS

## 2024-02-18 MED ORDER — PHENYLEPHRINE 80 MCG/ML (10ML) SYRINGE FOR IV PUSH (FOR BLOOD PRESSURE SUPPORT)
PREFILLED_SYRINGE | INTRAVENOUS | Status: DC | PRN
  Administered 2024-02-18 (×3): 80 ug via INTRAVENOUS

## 2024-02-18 MED ORDER — ORAL CARE MOUTH RINSE: 15.0000 mL | Freq: Once | OROMUCOSAL | Status: AC

## 2024-02-18 MED ORDER — MORPHINE SULFATE (PF) 2 MG/ML IV SOLN
1.0000 mg | INTRAVENOUS | Status: DC | PRN
  Administered 2024-02-18 – 2024-02-19 (×6): 2 mg via INTRAVENOUS
  Filled 2024-02-18 (×6): qty 1

## 2024-02-18 MED ORDER — PROMETHAZINE HCL 12.5 MG PO TABS: 12.5000 mg | ORAL_TABLET | ORAL | Status: DC | PRN

## 2024-02-18 MED ORDER — ROCURONIUM BROMIDE 10 MG/ML (PF) SYRINGE
PREFILLED_SYRINGE | INTRAVENOUS | Status: DC | PRN
  Administered 2024-02-18: 70 mg via INTRAVENOUS
  Administered 2024-02-18 (×2): 10 mg via INTRAVENOUS

## 2024-02-18 MED ORDER — LABETALOL HCL 5 MG/ML IV SOLN: 10.0000 mg | INTRAVENOUS | Status: DC | PRN

## 2024-02-18 MED ORDER — LABETALOL HCL 5 MG/ML IV SOLN
10.0000 mg | INTRAVENOUS | Status: DC | PRN
  Filled 2024-02-18: qty 4

## 2024-02-18 MED ORDER — SUGAMMADEX SODIUM 200 MG/2ML IV SOLN
INTRAVENOUS | Status: DC | PRN
  Administered 2024-02-18: 200 mg via INTRAVENOUS

## 2024-02-18 MED ORDER — NICARDIPINE HCL IN NACL 20-0.86 MG/200ML-% IV SOLN: 3.0000 mg/h | INTRAVENOUS | Status: DC

## 2024-02-18 MED ORDER — ENALAPRILAT 1.25 MG/ML IV SOLN
1.2500 mg | Freq: Four times a day (QID) | INTRAVENOUS | Status: DC | PRN
  Filled 2024-02-18: qty 1

## 2024-02-18 MED ORDER — AMISULPRIDE (ANTIEMETIC) 5 MG/2ML IV SOLN: 10.0000 mg | Freq: Once | INTRAVENOUS | Status: DC | PRN

## 2024-02-18 MED ORDER — DEXAMETHASONE SODIUM PHOSPHATE 10 MG/ML IJ SOLN
INTRAMUSCULAR | Status: DC | PRN
  Administered 2024-02-18: 10 mg via INTRAVENOUS

## 2024-02-18 SURGICAL SUPPLY — 70 items
BAG COUNTER SPONGE SURGICOUNT (BAG) ×1 IMPLANT
BENZOIN TINCTURE PRP APPL 2/3 (GAUZE/BANDAGES/DRESSINGS) ×1 IMPLANT
BLADE CLIPPER SURG (BLADE) ×1 IMPLANT
BNDG GAUZE DERMACEA FLUFF 4 (GAUZE/BANDAGES/DRESSINGS) IMPLANT
BUR ACORN 9.0 PRECISION (BURR) ×1 IMPLANT
BUR SPIRAL ROUTER 2.3 (BUR) ×1 IMPLANT
CANISTER SUCTION 3000ML PPV (SUCTIONS) ×1 IMPLANT
CATH VENTRIC 35X38 W/TROCAR LG (CATHETERS) IMPLANT
CLIP TI MEDIUM 6 (CLIP) IMPLANT
DERMABOND ADVANCED .7 DNX12 (GAUZE/BANDAGES/DRESSINGS) ×1 IMPLANT
DRAPE MICROSCOPE SLANT 54X150 (MISCELLANEOUS) IMPLANT
DRAPE NEUROLOGICAL W/INCISE (DRAPES) ×1 IMPLANT
DRAPE SHEET LG 3/4 BI-LAMINATE (DRAPES) ×1 IMPLANT
DRAPE WARM FLUID 44X44 (DRAPES) ×1 IMPLANT
DRESSING MEPILEX FLEX 4X4 (GAUZE/BANDAGES/DRESSINGS) IMPLANT
DRSG AQUACEL AG ADV 3.5X 4 (GAUZE/BANDAGES/DRESSINGS) IMPLANT
DRSG AQUACEL AG ADV 3.5X 6 (GAUZE/BANDAGES/DRESSINGS) ×1 IMPLANT
DRSG XEROFORM 1X8 (GAUZE/BANDAGES/DRESSINGS) IMPLANT
DURAPREP 26ML APPLICATOR (WOUND CARE) ×1 IMPLANT
ELECT COATED BLADE 2.86 ST (ELECTRODE) ×1 IMPLANT
ELECTRODE REM PT RTRN 9FT ADLT (ELECTROSURGICAL) ×1 IMPLANT
EVACUATOR 1/8 PVC DRAIN (DRAIN) IMPLANT
EVACUATOR SILICONE 100CC (DRAIN) IMPLANT
FORCEPS BIPOLAR SPETZLER 8 1.0 (NEUROSURGERY SUPPLIES) ×1 IMPLANT
GAUZE 4X4 16PLY ~~LOC~~+RFID DBL (SPONGE) IMPLANT
GAUZE SPONGE 4X4 12PLY STRL (GAUZE/BANDAGES/DRESSINGS) ×1 IMPLANT
GLOVE BIO SURGEON STRL SZ7 (GLOVE) ×2 IMPLANT
GLOVE BIOGEL PI IND STRL 7.5 (GLOVE) ×3 IMPLANT
GLOVE ECLIPSE 7.5 STRL STRAW (GLOVE) ×1 IMPLANT
GOWN STRL REUS W/ TWL LRG LVL3 (GOWN DISPOSABLE) ×2 IMPLANT
GOWN STRL REUS W/ TWL XL LVL3 (GOWN DISPOSABLE) ×1 IMPLANT
GOWN STRL REUS W/TWL 2XL LVL3 (GOWN DISPOSABLE) IMPLANT
HEMOSTAT POWDER KIT SURGIFOAM (HEMOSTASIS) ×1 IMPLANT
HEMOSTAT SURGICEL 2X14 (HEMOSTASIS) ×1 IMPLANT
HOOK RETRACTION 12 ELAST STAY (MISCELLANEOUS) IMPLANT
KIT BASIN OR (CUSTOM PROCEDURE TRAY) ×1 IMPLANT
KIT TURNOVER KIT B (KITS) ×1 IMPLANT
NDL HYPO 22X1.5 SAFETY MO (MISCELLANEOUS) ×1 IMPLANT
NEEDLE HYPO 22X1.5 SAFETY MO (MISCELLANEOUS) ×1 IMPLANT
NS IRRIG 1000ML POUR BTL (IV SOLUTION) ×3 IMPLANT
PACK CRANIOTOMY CUSTOM (CUSTOM PROCEDURE TRAY) ×1 IMPLANT
PATTIES SURGICAL .5 X.5 (GAUZE/BANDAGES/DRESSINGS) ×1 IMPLANT
PATTIES SURGICAL .5 X3 (DISPOSABLE) IMPLANT
PATTIES SURGICAL 1X1 (DISPOSABLE) ×1 IMPLANT
PERFORATOR LRG 14-11MM (BIT) IMPLANT
SEPRAFILM MEMBRANE 5X6 (MISCELLANEOUS) IMPLANT
SPONGE NEURO XRAY DETECT 1X3 (DISPOSABLE) IMPLANT
SPONGE SURGIFOAM ABS GEL 100 (HEMOSTASIS) ×1 IMPLANT
SPONGE T-LAP 18X18 ~~LOC~~+RFID (SPONGE) ×1 IMPLANT
STAPLER SKIN PROX 35W (STAPLE) ×2 IMPLANT
STAPLER VISISTAT (STAPLE) IMPLANT
STOCKINETTE 6 STRL (DRAPES) ×1 IMPLANT
STRIP CLOSURE SKIN 1/2X4 (GAUZE/BANDAGES/DRESSINGS) ×1 IMPLANT
SUT 3-0 BLK 1X30 PSL (SUTURE) IMPLANT
SUT ETHILON 3 0 PS 1 (SUTURE) IMPLANT
SUT MON AB 3-0 SH27 (SUTURE) IMPLANT
SUT NURALON 4 0 TR CR/8 (SUTURE) IMPLANT
SUT VIC AB 2-0 CP2 18 (SUTURE) ×2 IMPLANT
SUT VIC AB 3-0 SH 8-18 (SUTURE) IMPLANT
SUT VICRYL RAPIDE 3 0 (SUTURE) IMPLANT
SYR 30ML SLIP (SYRINGE) ×1 IMPLANT
SYSTEM CSF EXTERNAL DRAINAGE (MISCELLANEOUS) IMPLANT
TAPE PAPER 1X10 WHT MICROPORE (GAUZE/BANDAGES/DRESSINGS) ×1 IMPLANT
TAPE PAPER 2X10 WHT MICROPORE (GAUZE/BANDAGES/DRESSINGS) ×1 IMPLANT
TOWEL GREEN STERILE (TOWEL DISPOSABLE) ×1 IMPLANT
TOWEL GREEN STERILE FF (TOWEL DISPOSABLE) ×1 IMPLANT
TRAY FOLEY MTR SLVR 16FR STAT (SET/KITS/TRAYS/PACK) ×1 IMPLANT
TUBE CONNECTING 20X1/4 (TUBING) ×1 IMPLANT
TUBING FEATHERFLOW (TUBING) IMPLANT
WATER STERILE IRR 1000ML POUR (IV SOLUTION) ×1 IMPLANT

## 2024-02-18 NOTE — Plan of Care (Signed)
  Problem: Coping: Goal: Ability to adjust to condition or change in health will improve Outcome: Progressing   Problem: Health Behavior/Discharge Planning: Goal: Ability to identify and utilize available resources and services will improve Outcome: Progressing   Problem: Skin Integrity: Goal: Risk for impaired skin integrity will decrease Outcome: Progressing   Problem: Education: Goal: Knowledge of General Education information will improve Description: Including pain rating scale, medication(s)/side effects and non-pharmacologic comfort measures Outcome: Progressing   Problem: Clinical Measurements: Goal: Will remain free from infection Outcome: Progressing   Problem: Activity: Goal: Risk for activity intolerance will decrease Outcome: Progressing   Problem: Nutrition: Goal: Adequate nutrition will be maintained Outcome: Progressing

## 2024-02-18 NOTE — Anesthesia Preprocedure Evaluation (Addendum)
 Anesthesia Evaluation  Patient identified by MRN, date of birth, ID band Patient confused    Reviewed: Allergy & Precautions, H&P , NPO status , Patient's Chart, lab work & pertinent test results  Airway Mallampati: III  TM Distance: >3 FB Neck ROM: Full    Dental no notable dental hx. (+) Edentulous Upper, Edentulous Lower, Dental Advisory Given   Pulmonary COPD, Current Smoker   Pulmonary exam normal breath sounds clear to auscultation       Cardiovascular hypertension,  Rhythm:Regular Rate:Normal  IMPRESSIONS 01/31/24 Echo    1. Left ventricular ejection fraction, by estimation, is 60 to 65%. The  left ventricle has normal function. The left ventricle has no regional  wall motion abnormalities. Left ventricular diastolic parameters are  consistent with Grade I diastolic  dysfunction (impaired relaxation).   2. Right ventricular systolic function is normal. The right ventricular  size is normal.   3. The mitral valve is normal in structure. No evidence of mitral valve  regurgitation. No evidence of mitral stenosis.   4. The aortic valve was not well visualized. Aortic valve regurgitation  is mild. No aortic stenosis is present.   5. The inferior vena cava is normal in size with greater than 50%  respiratory variability, suggesting right atrial pressure of 3 mmHg.     Neuro/Psych Seizures -,   Anxiety Depression    SDH  Neuromuscular disease    GI/Hepatic Neg liver ROS, hiatal hernia,GERD  ,,  Endo/Other  diabetes, Type 2, Oral Hypoglycemic AgentsHypothyroidism    Renal/GU negative Renal ROS  negative genitourinary   Musculoskeletal   Abdominal   Peds  Hematology  (+) Blood dyscrasia, anemia   Anesthesia Other Findings   Reproductive/Obstetrics negative OB ROS                             Anesthesia Physical Anesthesia Plan  ASA: 4  Anesthesia Plan: General   Post-op Pain  Management: Ofirmev  IV (intra-op)* and Minimal or no pain anticipated   Induction: Intravenous, Rapid sequence and Cricoid pressure planned  PONV Risk Score and Plan: 1 and Dexamethasone, Ondansetron  and Treatment may vary due to age or medical condition  Airway Management Planned: Oral ETT  Additional Equipment:   Intra-op Plan:   Post-operative Plan: Extubation in OR and Possible Post-op intubation/ventilation  Informed Consent: I have reviewed the patients History and Physical, chart, labs and discussed the procedure including the risks, benefits and alternatives for the proposed anesthesia with the patient or authorized representative who has indicated his/her understanding and acceptance.     Dental advisory given  Plan Discussed with: CRNA  Anesthesia Plan Comments:        Anesthesia Quick Evaluation

## 2024-02-18 NOTE — TOC Progression Note (Signed)
 Transition of Care [facility]) - Progression Note    Patient Details  Name: [name] MRN: [id number] Date of Birth: 1961/09/07  Transition of Care [facility]) CM/SW Contact  Jannice Mends, LCSW Phone Number: 02/18/2024, 11:51 AM  Clinical Narrative:    CSW provided update to [facility]. They will need to obtain a new insurance authorization once patient is medically stable post surgery.   Expected Discharge Plan: Skilled Nursing Facility Barriers to Discharge: Continued Medical Work up  Expected Discharge Plan and Services In-house Referral: Clinical Social Work   Post Acute Care Choice: Skilled Nursing Facility Living arrangements for the past 2 months: Single Family Home                                       Social Determinants of Health (SDOH) Interventions SDOH Screenings   Food Insecurity: Patient Unable To Answer (02/02/2024)  Housing: Patient Unable To Answer (02/02/2024)  Transportation Needs: Patient Unable To Answer (02/02/2024)  Utilities: Patient Unable To Answer (02/02/2024)  Financial Resource Strain: Low Risk  (12/31/2022)   Received from [facility]  Physical Activity: Unknown (03/27/2023)   Received from [facility]  Social Connections: Somewhat Isolated (03/27/2023)   Received from [facility]  Stress: Stress Concern Present (03/27/2023)   Received from [facility]  Tobacco Use: High Risk (01/30/2024)    Readmission Risk Interventions     No data to display

## 2024-02-18 NOTE — Progress Notes (Addendum)
 Enlarging subdural hematoma with midline shift Page from radiology Dr. Danise Durie informing that repeat head CT scan showing increase size of subdural hematoma with midline shift now.  Spoke with patient's RN that over night patient has some intermittent confusion however which is patient's baseline and there is no no new neurological change on physical exam as well.  Spoke with on-call neurosurgery Sammye Cristal.  Will inform on-call neurosurgery Dr. [name] to see in the daytime.  Per Sammye Cristal if patient does not have any new change of mental status or new neurological sign it is possible that  will continue to observe patient clinically.  Amaziah Ghosh, MD [facility] 02/18/2024, 6:12 AM

## 2024-02-18 NOTE — Transfer of Care (Signed)
 Immediate Anesthesia Transfer of Care Note  Patient: [name]  Procedure(s) Performed: CRANIOTOMY HEMATOMA EVACUATION SUBDURAL (Right)  Patient Location: PACU  Anesthesia Type:General  Level of Consciousness: awake, alert , and oriented  Airway & Oxygen Therapy: Patient Spontanous Breathing and Patient connected to nasal cannula oxygen  Post-op Assessment: Report given to RN and Post -op Vital signs reviewed and stable  Post vital signs: Reviewed and stable  Last Vitals:  Vitals Value Taken Time  BP 148/72 02/18/24 1449  Temp    Pulse 78 02/18/24 1451  Resp 14 02/18/24 1451  SpO2 92 % 02/18/24 1451  Vitals shown include unfiled device data.  Last Pain:  Vitals:   02/18/24 1243  TempSrc:   PainSc: Asleep      Patients Stated Pain Goal: 3 (02/12/24 0830)  Complications: No notable events documented.

## 2024-02-18 NOTE — Progress Notes (Signed)
 OT Cancellation Note  Patient Details Name: [name] MRN: [id number] DOB: 1961-04-05   Cancelled Treatment:    Reason Eval/Treat Not Completed: Medical issues which prohibited therapy. Chart reviewed, pt noted with SDH increased mass effect pending medical intervention. OT will hold treatment at this time and see when medically appropriate.   Meyli Boice L. Kasson Lamere, OTR/L  02/18/24, 10:57 AM

## 2024-02-18 NOTE — Anesthesia Procedure Notes (Addendum)
 Procedure Name: Intubation Date/Time: 02/18/2024 1:22 PM  Performed by: Emmitt Harp, CRNAPre-anesthesia Checklist: Patient identified, Emergency Drugs available, Suction available and Patient being monitored Patient Re-evaluated:Patient Re-evaluated prior to induction Oxygen Delivery Method: Circle System Utilized Preoxygenation: Pre-oxygenation with 100% oxygen Induction Type: IV induction Ventilation: Mask ventilation without difficulty Laryngoscope Size: Mac and 3 Grade View: Grade I Tube type: Oral Tube size: 7.5 mm Number of attempts: 1 Airway Equipment and Method: Stylet and Oral airway Placement Confirmation: ETT inserted through vocal cords under direct vision, positive ETCO2 and breath sounds checked- equal and bilateral Secured at: 22 (@ lip) cm Tube secured with: Tape Dental Injury: Teeth and Oropharynx as per pre-operative assessment  Comments: Atraumatic

## 2024-02-18 NOTE — Progress Notes (Addendum)
 PROGRESS NOTE        PATIENT DETAILS Name: [name] Age: 63 y.o. Sex: male Date of Birth: 11-Sep-1961 Admit Date: 01/30/2024 Admitting Physician Lind Repine, MD UJW:[id number], Moises Ang, MD  Brief Summary: Patient is a 63 y.o.  male with history of COPD, DM-2, hypothyroidism, anxiety disorder-presented with a fall-found to have subdural hematoma and seizures.  Hospital course complicated by FUO-ultimately found to have right upper extremity DVT probably due to PICC line, fever resolved after PICC line was removed.  He was not a candidate for anticoagulation due to moderate-sized SDH.  At neurosurgery's recommendation-repeat CT head was done on 5/27-which showed further worsening of SDH-subsequently reevaluated by neurosurgery with plans for burr hole/MMA embolization 5/27.  Significant events: 5/8>> admit to ICU 5/12>> transferred to [facility] 5/23>>ID Consult for FUO-PICC line removed-right upper ext DVT. 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  Scattered SAH. 5/8>> CT C-spine: No fracture/traumatic malalignment 5/9>> CT head: Slight increased size of SDH.  Small volume SAH. 5/13>> CT head: Resolved left SDH, increased right SDH-11 mm.  Small volume SAH. 5/21>>CXR: No obvious pneumonia 5/22>> CT chest: No PNA 5/23>> B/L lower extremity Doppler: No DVT 5/23>> B/L upper extremity Doppler: DVT right axillary/right brachial vein, SVT left cephalic vein 5/27>> CT head: SDH now 14 mm-with increased mass effect.  Significant microbiology data: 5/8>> COVID/influenza/RSV PCR: Negative 5/8>> blood culture:  Negative 5/12>> urine culture: E. coli 5/18>> blood cultures: No growth 5/21>> blood culture: No growth 5/23>> respiratory virus panel: Negative  Procedures: None  Consults: PCCM Neurology Neurosurgery Infectious disease  Subjective: Afebrile-complains of pain in his left upper extremity.  Objective: Vitals: Blood pressure (!) 144/83, pulse 83, temperature 98.2 F (36.8 C), temperature source Axillary, resp. rate 20, height 5\' 6"  (1.676 m), weight 82.2 kg, SpO2 97%.   Exam: Awake/alert Limited LUE movement today due to pain in his arm Abdomen: Soft nontender nondistended Extremities: No edema  Pertinent Labs/Radiology:    Latest Ref Rng & Units 02/17/2024    4:19 AM 02/13/2024    2:15 AM 02/12/2024    2:43 AM  CBC  WBC 4.0 - 10.5 K/uL 6.0  9.7  11.1   Hemoglobin 13.0 - 17.0 g/dL 9.3  7.9  8.5   Hematocrit 39.0 - 52.0 % 27.8  23.8  25.6   Platelets 150 - 400 K/uL 303  282  315     Lab Results  Component Value Date   NA 133 (L) 02/13/2024   K 3.6 02/13/2024   CL 104 02/13/2024   CO2 22 02/13/2024      Assessment/Plan: Traumatic SDH/SAH Evaluated by neurosurgery-recommendations were for conservative/nonoperative management-Per neurosurgery recommendations-repeat CT head was done on 5/27 which unfortunately shows worsening SDH-kept n.p.o.-for bur hole/MMA embolization today.    Acute toxic metabolic encephalopathy Multifactorial etiology-SDH/seizures/AEDs Overall much improved-awake/alert this morning  Seizures Likely provoked from SDH. No further seizures LTM EEG negative Continue phenobarbital  and Vimpat . Needs patient follow-up with neurology.  Complicated E. coli UTI S/p Rocephin  x 5 days-last dose on 5/17  Fever Likely etiology upper extremity DVT-likely  related to PICC line Blood cultures/UA/respiratory virus panel/CT chest all negative for any source of infection. Afebrile now-PICC line removed  Right upper extremity DVT Possible related to  PICC line (now removed-5/23) Appreciate neurosurgical input-okay for prophylactic anticoagulation but not appropriate for therapeutic anticoagulation.   Note discussed with spouse (retired Char[employer] fundraiser) at bedside on 5/24-she is okay with prophylactic anticoagulation-understands the risks of bleeding. Subcutaneous heparin  discontinued 5/27 due to worsening SDH.  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  COPD Not in exacerbation Continue bronchodilators  Tobacco abuse Counseled  Nutrition Status: Nutrition Problem: Increased nutrient needs Etiology: chronic illness (COPD) Signs/Symptoms: estimated needs Interventions: Ensure Enlive (each supplement provides 350kcal and 20 grams of protein), MVI   Pressure Ulcer: Pressure Injury 01/31/24 Coccyx Mid Deep Tissue Pressure Injury - Purple or maroon localized area of discolored intact skin or blood-filled blister due to damage of underlying soft tissue from pressure and/or shear. (Active)  01/31/24 0100  Location: Coccyx  Location Orientation: Mid  Staging: Deep Tissue Pressure Injury - Purple or maroon localized area of discolored intact skin or blood-filled blister due to damage of underlying soft tissue from pressure and/or shear.  Wound Description (Comments):   Present on Admission: Yes  Dressing Type Foam - Lift dressing to assess site every shift 02/18/24 0800   Class 1 obesity: Estimated body mass index is 29.25 kg/m as calculated from the following:   Height as of this encounter: 5\' 6"  (1.676 m).   Weight as of this encounter: 82.2 kg.   Code status:   Code Status: Full Code   DVT Prophylaxis: Place and maintain sequential compression device Start: 02/05/24 0657 SCDs Start: 01/30/24 2330   Family Communication: Spouse [name] at bedside on  5/27   Disposition Plan: Status is: Inpatient Remains inpatient appropriate because: Severity of illness   Planned Discharge Destination:Skilled nursing facility   Diet: Diet Order             Diet NPO time specified  Diet effective now                     Antimicrobial agents: Anti-infectives (From admission, onward)    Start     Dose/Rate Route Frequency Ordered Stop   02/04/24 1030  cefTRIAXone  (ROCEPHIN ) 1 g in sodium chloride  0.9 % 100 mL IVPB        1 g 200 mL/hr over 30 Minutes Intravenous Every 24 hours 02/04/24 0937 02/08/24 1257        MEDICATIONS: Scheduled Meds:  ALPRAZolam   0.5 mg Oral BID   arformoterol   15 mcg Nebulization BID   budesonide  (PULMICORT ) nebulizer solution  0.25 mg Nebulization BID   Chlorhexidine  Gluconate Cloth  6 each Topical Daily   feeding supplement  237 mL Oral BID BM   insulin  aspart  0-9 Units Subcutaneous TID AC & HS   lacosamide   200 mg Oral BID   levothyroxine   150 mcg Oral Q0600   metoprolol  tartrate  25 mg Oral BID   multivitamin with minerals  1 tablet Oral Daily   mouth rinse  15 mL Mouth Rinse 4 times per day   pantoprazole   40 mg Oral Daily   phenobarbital   64.8 mg Oral BID   revefenacin   175 mcg Nebulization Daily   sodium chloride  flush  10-40 mL Intracatheter Q12H   sodium chloride   1 g  Oral BID   Continuous Infusions:   PRN Meds:.docusate sodium , haloperidol  lactate, hydrALAZINE , hydrALAZINE , labetalol , ondansetron  (ZOFRAN ) IV, polyethylene glycol, sodium chloride  flush, traMADol    I have personally reviewed following labs and imaging studies  LABORATORY DATA: CBC: Recent Labs  Lab 02/12/24 0243 02/13/24 0215 02/17/24 0419  WBC 11.1* 9.7 6.0  HGB 8.5* 7.9* 9.3*  HCT 25.6* 23.8* 27.8*  MCV 95.5 96.0 96.5  PLT 315 282 303    Basic Metabolic Panel: Recent Labs  Lab 02/12/24 0243 02/13/24 0215  NA 132* 133*  K 3.8 3.6  CL 102 104  CO2 22 22  GLUCOSE 126* 127*  BUN 20 20  CREATININE  0.64 0.60*  CALCIUM  8.6* 8.5*  MG 1.9 1.8  PHOS 3.8 3.6    GFR: Estimated Creatinine Clearance: 92.7 mL/min (A) (by C-G formula based on SCr of 0.6 mg/dL (L)).  Liver Function Tests: No results for input(s): "AST", "ALT", "ALKPHOS", "BILITOT", "PROT", "ALBUMIN" in the last 168 hours.  No results for input(s): "LIPASE", "AMYLASE" in the last 168 hours. No results for input(s): "AMMONIA" in the last 168 hours.  Coagulation Profile: No results for input(s): "INR", "PROTIME" in the last 168 hours.  Cardiac Enzymes: No results for input(s): "CKTOTAL", "CKMB", "CKMBINDEX", "TROPONINI" in the last 168 hours.  BNP (last 3 results) No results for input(s): "PROBNP" in the last 8760 hours.  Lipid Profile: No results for input(s): "CHOL", "HDL", "LDLCALC", "TRIG", "CHOLHDL", "LDLDIRECT" in the last 72 hours.  Thyroid  Function Tests: No results for input(s): "TSH", "T4TOTAL", "FREET4", "T3FREE", "THYROIDAB" in the last 72 hours.  Anemia Panel: No results for input(s): "VITAMINB12", "FOLATE", "FERRITIN", "TIBC", "IRON", "RETICCTPCT" in the last 72 hours.  Urine analysis:    Component Value Date/Time   COLORURINE YELLOW 02/12/2024 1046   APPEARANCEUR CLEAR 02/12/2024 1046   LABSPEC 1.018 02/12/2024 1046   PHURINE 5.0 02/12/2024 1046   GLUCOSEU 50 (A) 02/12/2024 1046   HGBUR NEGATIVE 02/12/2024 1046   BILIRUBINUR NEGATIVE 02/12/2024 1046   KETONESUR NEGATIVE 02/12/2024 1046   PROTEINUR NEGATIVE 02/12/2024 1046   UROBILINOGEN 0.2 03/03/2014 1750   NITRITE NEGATIVE 02/12/2024 1046   LEUKOCYTESUR NEGATIVE 02/12/2024 1046    Sepsis Labs: Lactic Acid, Venous    Component Value Date/Time   LATICACIDVEN 2.1 (HH) 01/30/2024 2020    MICROBIOLOGY: Recent Results (from the past 240 hours)  Culture, blood (Routine X 2) w Reflex to ID Panel     Status: None   Collection Time: 02/09/24  2:51 PM   Specimen: BLOOD LEFT HAND  Result Value Ref Range Status   Specimen Description BLOOD  LEFT HAND  Final   Special Requests   Final    BOTTLES DRAWN AEROBIC AND ANAEROBIC Blood Culture adequate volume   Culture   Final    NO GROWTH 5 DAYS Performed at [facility], [address], [place], [place] [address]    Report Status 02/14/2024 FINAL  Final  Culture, blood (Routine X 2) w Reflex to ID Panel     Status: None   Collection Time: 02/09/24  2:51 PM   Specimen: BLOOD LEFT ARM  Result Value Ref Range Status   Specimen Description BLOOD LEFT ARM  Final   Special Requests   Final    BOTTLES DRAWN AEROBIC AND ANAEROBIC Blood Culture adequate volume   Culture   Final    NO GROWTH 5 DAYS Performed at [facility], [address] [place], [place] [address]    Report  Status 02/14/2024 FINAL  Final  Culture, blood (Routine X 2) w Reflex to ID Panel     Status: None   Collection Time: 02/12/24 11:16 AM   Specimen: BLOOD LEFT HAND  Result Value Ref Range Status   Specimen Description BLOOD LEFT HAND  Final   Special Requests   Final    BOTTLES DRAWN AEROBIC AND ANAEROBIC Blood Culture results may not be optimal due to an inadequate volume of blood received in culture bottles   Culture   Final    NO GROWTH 5 DAYS Performed at [facility], [address] [place], [place] [address]    Report Status 02/17/2024 FINAL  Final  Culture, blood (Routine X 2) w Reflex to ID Panel     Status: None   Collection Time: 02/12/24 11:18 AM   Specimen: BLOOD LEFT HAND  Result Value Ref Range Status   Specimen Description BLOOD LEFT HAND  Final   Special Requests   Final    BOTTLES DRAWN AEROBIC AND ANAEROBIC Blood Culture adequate volume   Culture   Final    NO GROWTH 5 DAYS Performed at [facility], [address], [place], [place] [address]    Report Status 02/17/2024 FINAL  Final  Respiratory (~20 pathogens) panel by PCR     Status: None   Collection Time: 02/14/24  2:32 PM   Specimen: Nasopharyngeal Swab; Respiratory  Result Value Ref Range Status    Adenovirus NOT DETECTED NOT DETECTED Final   Coronavirus 229E NOT DETECTED NOT DETECTED Final    Comment: (NOTE) The Coronavirus on the Respiratory Panel, DOES NOT test for the novel  Coronavirus (2019 nCoV)    Coronavirus HKU1 NOT DETECTED NOT DETECTED Final   Coronavirus NL63 NOT DETECTED NOT DETECTED Final   Coronavirus OC43 NOT DETECTED NOT DETECTED Final   Metapneumovirus NOT DETECTED NOT DETECTED Final   Rhinovirus / Enterovirus NOT DETECTED NOT DETECTED Final   Influenza A NOT DETECTED NOT DETECTED Final   Influenza B NOT DETECTED NOT DETECTED Final   Parainfluenza Virus 1 NOT DETECTED NOT DETECTED Final   Parainfluenza Virus 2 NOT DETECTED NOT DETECTED Final   Parainfluenza Virus 3 NOT DETECTED NOT DETECTED Final   Parainfluenza Virus 4 NOT DETECTED NOT DETECTED Final   Respiratory Syncytial Virus NOT DETECTED NOT DETECTED Final   Bordetella pertussis NOT DETECTED NOT DETECTED Final   Bordetella Parapertussis NOT DETECTED NOT DETECTED Final   Chlamydophila pneumoniae NOT DETECTED NOT DETECTED Final   Mycoplasma pneumoniae NOT DETECTED NOT DETECTED Final    Comment: Performed at [facility], [address], [place], [place] [address]    RADIOLOGY STUDIES/RESULTS: CT HEAD WO CONTRAST ( ) Addendum Date: 02/18/2024 ADDENDUM REPORT: 02/18/2024 06:39 ADDENDUM: Study discussed by telephone with Dr. Sundil on 02/18/2024 at 0605 hours. Electronically Signed   By: Marlise Simpers M.D.   On: 02/18/2024 06:39   Result Date: 02/18/2024 CLINICAL DATA:  63 year old male with history of altered mental status, fall, subdural hematomas. EXAM: CT HEAD WITHOUT CONTRAST TECHNIQUE: Contiguous axial images were obtained from the base of the skull through the vertex without intravenous contrast. RADIATION DOSE REDUCTION: This exam was performed according to the departmental dose-optimization program which includes automated exposure control, adjustment of the mA and/or kV according to patient size  and/or use of iterative reconstruction technique. COMPARISON:  Head CT 02/04/2024 and earlier. FINDINGS: Brain: Increased and now mixed density right side subdural hematoma since 02/04/2024, up to 14  mm thickness now on coronal images (coronal image 39) versus 11 mm at approximately the same level 02/04/2024. Associated increased intracranial mass effect and leftward midline shift. Midline shift now 9-10 mm (previously 4-5 mm. Increased mass effect on the right lateral ventricle. Mildly trapped appearance of the left lateral ventricle which has not significantly changed along with a small volume of layering intraventricular hemorrhage (series 2, image 21). Basilar cistern patency not significantly changed, maintained. Stable gray-white matter differentiation throughout the brain. No other intracranial hemorrhage identified. Vascular: No suspicious intracranial vascular hyperdensity. Skull: Appears stable and intact. Sinuses/Orbits: Visualized paranasal sinuses and mastoids are stable and well aerated. Other: No acute orbit or scalp soft tissue finding identified. IMPRESSION: 1. Progressed and now mixed density Right Subdural Hematoma: 14 mm thickness (11 mm on 02/04/2024) with increased intracranial mass effect and leftward midline shift now 9-10 mm (previously 4-5 mm). 2. Increased mass effect on the right lateral ventricle. Mildly trapped appearance of the left lateral ventricle stable along with small volume layering intraventricular hemorrhage. 3. No skull fracture identified. Electronically Signed: By: Marlise Simpers M.D. On: 02/18/2024 05:55      LOS: 19 days   Kimberly Penna, MD  [facility]    To contact the attending provider between 7A-7P or the covering provider during after hours 7P-7A, please log into the web site [email or web address] and access using universal [place] password for that web site. If you do not have the password, please call the hospital operator.  02/18/2024, 9:39 AM

## 2024-02-18 NOTE — Op Note (Signed)
 Procedure(s): [employer] Procedure Note    Procedure(s) and Anesthesia Type:    right sided burr holes and placement of subdural drain for evacuation of subdural hematoma  Surgeon(s) and Role:    [name], Cleatrice Curl, MD - Primary    Indications: This is a 63 yo M with numerous medical comorbidities who fell and suffered an acute subdural hematoma without significant mass effect 3 weeks ago.  Interval CT showed subacute to chronic subdural hematoma, now with significant mass effect of 9 mm. He developed decreased level of consciousness and cognitive decline, so burr hole drainage was recommended to the wife.  Risks, benefits, alternatives, and expected convalescence were discussed with the wife.  Risks discussed included but were not limited to bleeding, pain, infection, seizure, stroke, scar, subdural recurrence, neurologic deficit, coma, and death.  Informed consent was obtained.  Surgeon: Van Gelinas   Assistants: None  Anesthesia: General endotracheal anesthesia   Procedure in detail:  The patient was brought to the operating room.  A timeout was performed.  General anesthesia was induced and patient was intubated by the anesthesia service.  After appropriate lines and monitors were placed, patient's head was turned to the left and scalp was shaved, preprepped with alcohol and cleansing solution and prepped and draped in sterile fashion.  1% lidocaine  with epinephrine  injected into the planned incisions.  2 incisions were made with a 10 blade just above the superior temporal line, one in the frontal region and one in the parietal region.  The periosteum was swept off the skull and self-retaining retractors were placed.  Perforator was used to prep performed bur holes at each incision.  The dura was then coagulated and opened in cruciate manner.  Superficial membrane was coagulated and opened at each bur hole and chronic subdural fluid was encountered.  The subdural space was  irrigated thoroughly with good communication between the openings and the irrigation returned clear.  The brain was inspected and appeared to be reexpanding appropriately.  However, as it had not fully returned from its sunken status, and EVD catheter was used as a subdural drain and placed in the posterior bur hole and turned out the skin and secured with a stitch.  The dural defects were covered with Gelfoam and the subdural space was irrigated one last time.  The incisions were closed with 2-0 Vicryl stitches and staples.  The subdural drain was connected to a drainage bag.    Sterile dressing was then placed on the incision.  Patient was then extubated by the anesthesia service moving all extremities.  All counts were correct at the end of surgery.  No complications were noted.  Findings: Large subacute subdural hematoma  Estimated Blood Loss:  < 10 ml         Drains: subdural drain        Specimens: none         Implants: none        Complications:  none         Disposition: To PACU         Condition: stable

## 2024-02-18 NOTE — Progress Notes (Deleted)
 Report called to hospice house.  AVS printed to place in packet with transport.

## 2024-02-18 NOTE — Progress Notes (Cosign Needed Addendum)
 NAME:  [name], MRN:  [id number], DOB:  Nov 05, 1960, LOS: 19 ADMISSION DATE:  01/30/2024, CONSULTATION DATE:  02/05/24 REFERRING MD:  [facility], CHIEF COMPLAINT:  fall hypothermia    History of Present Illness:  63 year old man who was brought in by EMS from home after a fall.  Apparently wife called, patient is currently ANO x 1 and history obtained after chart review, unable to reach wife.  His heart rate initially was in the 80s, A/O x 1 per EMS, bradycardic to 40s in the ED , noted to be hypothermic 94.  Bruises of multiple ages left hip left eye and left shoulder Labs significant for no leukocytosis, hemoglobin 9.3, mild thrombocytopenia 1 27K, mild hypokalemia 3.3, hypomagnesemia 1.5, normal LFTs.  UDS positive for benzodiazepines, tox screen negative, ammonia 19, TSH 48.  EKG showed sinus bradycardia He was given 1 g of magnesium , 100 mg of IV Solu-Cortef  and 200 mics of levothyroxine  IV Head CT showed 9 mm right subdural hematoma with 3 mm midline shift and small volume acute SAH, cervical spine was negative. PCCM consulted for admission 02/05/2024 pulmonary critical care reconsulted for increased seizure activity and concern that his respiratory status may worsen with phenobarbital . S/o 5/15  5/27 PCCM re consulted post op day  0 burr holes SDH, with plan for ICU transfer   Pertinent  Medical History  Sz disorder COPD anxiety hypothyroidism   Significant Hospital Events: Including procedures, antibiotic start and stop dates in addition to other pertinent events   5/8 Admitted 5/9 No overnight issues, patient remained confused and restless, trying to get out of bed 5/10 ongoing issues with agitation overnight 5/14: PCCM was consulted 5/15: On RA and feels better. SpO2 93% 5/23 FUO, ID consult PICC removed   5/27: declining mental status, incr midline shift. OR for crani   Interim History / Subjective:   POD 0 Burr holes  Objective    Blood pressure 130/68, pulse 72,  temperature 97.7 F (36.5 C), resp. rate 11, height 5\' 6"  (1.676 m), weight 82.2 kg, SpO2 91%.        Intake/Output Summary (Last 24 hours) at 02/18/2024 1628 Last data filed at 02/18/2024 1435 Gross per 24 hour  Intake 1000 ml  Output 550 ml  Net 450 ml   Filed Weights   02/17/24 0500 02/18/24 0500 02/18/24 1230  Weight: 82 kg 82.2 kg 82.2 kg    Examination: General: Chronically and acutely ill older adult M  HENT: EVD with bloody output  Lungs: CTA on RA  Cardiovascular: rrr s1s2 cap refill < 3 sec  Abdomen: soft ndnt  Extremities: L flank and thigh bruising  Neuro: Opens eyes, moving spontaneously. Drowsy GU: foley   Resolved problem list   Assessment and Plan    Traumatic SDH/SAH with brain compression Seizure due to above  -s/p burr hole, drain P -post op, drain, imaging per NSGY  -txf to ICU, we will take over as primary from hospitalist  team until txf out  -follow neuro exams -SBP <  140 -- has PRNs ordered  -PRN analgesia -PT/OT when appropriate -hopefully diet later today  -holding vte ppx  -cont phenobarb + Vimpat  -- will need outpt neuro follow up   Acute encephalopathy due to anesthesia -expect to improve -follow neuro exam   RUE DVT -not a candidate for therapeutic AC-- was on ppx but now held w worsening SDH   HTN  -goal SBP < 140   COPD Tobacco use -BDs  Severe hypothyroidism  -  synthroid     Best Practice (right click and "Reselect all SmartList Selections" daily)   Diet/type: NPO DVT prophylaxis SCD Pressure ulcer(s): pressure ulcer assessment deferred  GI prophylaxis: N/A Lines: N/A Foley:  Yes, and it is still needed Code Status:  full code Last date of multidisciplinary goals of care discussion [--]  Labs   CBC: Recent Labs  Lab 02/12/24 0243 02/13/24 0215 02/17/24 0419  WBC 11.1* 9.7 6.0  HGB 8.5* 7.9* 9.3*  HCT 25.6* 23.8* 27.8*  MCV 95.5 96.0 96.5  PLT 315 282 303    Basic Metabolic Panel: Recent Labs  Lab  02/12/24 0243 02/13/24 0215  NA 132* 133*  K 3.8 3.6  CL 102 104  CO2 22 22  GLUCOSE 126* 127*  BUN 20 20  CREATININE 0.64 0.60*  CALCIUM  8.6* 8.5*  MG 1.9 1.8  PHOS 3.8 3.6   GFR: Estimated Creatinine Clearance: 92.7 mL/min (A) (by C-G formula based on SCr of 0.6 mg/dL (L)). Recent Labs  Lab 02/12/24 0243 02/12/24 1118 02/13/24 0215 02/17/24 0419  PROCALCITON  --  <0.10  --   --   WBC 11.1*  --  9.7 6.0    Liver Function Tests: No results for input(s): "AST", "ALT", "ALKPHOS", "BILITOT", "PROT", "ALBUMIN" in the last 168 hours. No results for input(s): "LIPASE", "AMYLASE" in the last 168 hours. No results for input(s): "AMMONIA" in the last 168 hours.  ABG    Component Value Date/Time   PHART 7.49 (H) 02/05/2024 1530   PCO2ART 37 02/05/2024 1530   PO2ART 91 02/05/2024 1530   HCO3 28.2 (H) 02/05/2024 1530   TCO2 29 01/22/2016 2138   O2SAT 98.8 02/05/2024 1530     Coagulation Profile: No results for input(s): "INR", "PROTIME" in the last 168 hours.  Cardiac Enzymes: No results for input(s): "CKTOTAL", "CKMB", "CKMBINDEX", "TROPONINI" in the last 168 hours.  HbA1C: Hgb A1c MFr Bld  Date/Time Value Ref Range Status  01/31/2024 05:28 AM 6.1 (H) 4.8 - 5.6 % Final    Comment:    (NOTE) Pre diabetes:          5.7%-6.4%  Diabetes:              >6.4%  Glycemic control for   <7.0% adults with diabetes     CBG: Recent Labs  Lab 02/17/24 2300 02/18/24 0440 02/18/24 0737 02/18/24 1115 02/18/24 1448  GLUCAP 155* 148* 137* 185* 125*   CCT na    Eston Hence MSN, AGACNP-BC [facility] Amion for pager 02/18/2024, 4:48 PM

## 2024-02-18 NOTE — Progress Notes (Signed)
 Speech Language Pathology Treatment: Cognitive-Linquistic  Patient Details Name: [name] MRN: [id number] DOB: [birth year] Today's Date: 02/18/2024 Time: 2952-8413 SLP Time Calculation (min) (ACUTE ONLY): 16 min  Assessment / Plan / Recommendation Clinical Impression  Treatment targeted cognition and unable to observe with po's due to having procedure today (burr holes) with wife present. He continues to exhibit cognitive moderate-significant deficits in the areas of awareness, orientation, problem solving, right inattention and memory. He states he is here with seizure disorder however admitted after fall and suffered TBI. SLP had pt repeat reason for admission x 3 and asked him to recall. He is unable to use written cues as strategy due to wife reports he has cataracts. Throughout session he was unable to recall reason for admission. He was oriented to place and needed cues to problem solve contacting the nurse. Wife reports his cognition has improved from last week. ST will continue to see for cognition and dysphagia. Wife reports "he has done well with the upgraded Dys 3 diet."   HPI HPI: Pt is a 63 y.o. male who was brought to ED by EMS from home after a fall. CT head (01/30/23) revealed "Slightly increased size of right larger than left subdural hematomas. Small volume subarachnoid hemorrhage with mildly increased hemorrhage in the left lateral ventricle. Failed yale 02/01/24. PMH: Seizure disorder, COPD; ST f/u for po readiness in acute setting; pt currently has TF for nutritive/hydration purposes.      SLP Plan  Continue with current plan of care      Recommendations for follow up therapy are one component of a multi-disciplinary discharge planning process, led by the attending physician.  Recommendations may be updated based on patient status, additional functional criteria and insurance authorization.    Recommendations                         Frequent or constant  Supervision/Assistance Cognitive communication deficit (K44.010)     Continue with current plan of care     [name]  02/18/2024, 9:46 AM

## 2024-02-18 NOTE — Progress Notes (Signed)
 eLink Physician-Brief Progress Note Patient Name: [name] DOB: [birth year] MRN: [id number]   Date of Service  02/18/2024  HPI/Events of Note  Bedside RN states patient only took Xanax  for 2200 meds and asking if theres anyway we can change the Vimpat , Phenobarb and Lopressor  to IV form so patient doesn't miss these meds. He is spitting the meds out for RN.  eICU Interventions  Above medications changed to IV form Dosage conferred with pharm D     Intervention Category Intermediate Interventions: Other:  Turner Gains 02/18/2024, 10:23 PM

## 2024-02-18 NOTE — Progress Notes (Signed)
 Neurosurgery  63 year old man with numerous medical comorbidities who has a right sided acute subdural hematoma that is now subacute and mostly liquefied, but with increased midline shift of 9 mm.  He has decreased cognition and some drowsiness, but certainly some of this may be due to poorly controlled medical comorbidities.  Nevertheless, with the significant size of his subdural hematoma and mass effect, I am recommending bur hole drainage, possibly with MMA embolization to follow at a later point.  I discussed in detail the procedure with the patient's wife.  Possibility of craniectomy, ventricular or lumbar drain placement, and central line placement was also discussed. Risks, benefits, alternatives, and expected convalescence was discussed.  Risks discussed included but were not limited to bleeding, pain, infection, seizure, stroke, scar, recurrence, cerebrospinal fluid leak, neurologic deficit, paralysis, coma and death.  I also discussed with her the possibility of blood transfusion during surgery and consent to transfuse blood products if necessary was also obtained.  The patient did have small bits of egg earlier this morning but with his significant midline shift, we will plan to proceed with surgery urgently.  All questions and concerns were answered and understanding and agreement with the plan was verbalized.

## 2024-02-19 ENCOUNTER — Inpatient Hospital Stay (HOSPITAL_COMMUNITY): Payer: Medicare (Managed Care)

## 2024-02-19 ENCOUNTER — Encounter (HOSPITAL_COMMUNITY): Payer: Self-pay | Admitting: Neurosurgery

## 2024-02-19 DIAGNOSIS — S065XAA Traumatic subdural hemorrhage with loss of consciousness status unknown, initial encounter: Secondary | ICD-10-CM | POA: Diagnosis not present

## 2024-02-19 DIAGNOSIS — I82621 Acute embolism and thrombosis of deep veins of right upper extremity: Secondary | ICD-10-CM | POA: Diagnosis not present

## 2024-02-19 DIAGNOSIS — Z720 Tobacco use: Secondary | ICD-10-CM | POA: Diagnosis not present

## 2024-02-19 LAB — GLUCOSE, CAPILLARY
Glucose-Capillary: 138 mg/dL — ABNORMAL HIGH (ref 70–99)
Glucose-Capillary: 135 mg/dL — ABNORMAL HIGH (ref 70–99)
Glucose-Capillary: 150 mg/dL — ABNORMAL HIGH (ref 70–99)
Glucose-Capillary: 142 mg/dL — ABNORMAL HIGH (ref 70–99)

## 2024-02-19 MED ORDER — MORPHINE SULFATE (PF) 2 MG/ML IV SOLN
1.0000 mg | INTRAVENOUS | Status: DC | PRN
  Administered 2024-02-19 – 2024-02-23 (×10): 2 mg via INTRAVENOUS
  Filled 2024-02-19 (×10): qty 1

## 2024-02-19 MED ORDER — TRAMADOL HCL 50 MG PO TABS
50.0000 mg | ORAL_TABLET | Freq: Four times a day (QID) | ORAL | Status: DC | PRN
  Administered 2024-02-19 – 2024-02-23 (×5): 50 mg via ORAL
  Filled 2024-02-19 (×6): qty 1

## 2024-02-19 MED ORDER — LACOSAMIDE 200 MG PO TABS: 200.0000 mg | ORAL_TABLET | Freq: Two times a day (BID) | ORAL | Status: DC

## 2024-02-19 MED ORDER — PROSOURCE PLUS PO LIQD
30.0000 mL | Freq: Two times a day (BID) | ORAL | Status: DC
  Administered 2024-02-20 – 2024-02-21 (×2): 30 mL via ORAL
  Filled 2024-02-19 (×6): qty 30

## 2024-02-19 MED ORDER — LACOSAMIDE 200 MG PO TABS
200.0000 mg | ORAL_TABLET | Freq: Two times a day (BID) | ORAL | Status: DC
  Administered 2024-02-19 – 2024-02-24 (×10): 200 mg via ORAL
  Filled 2024-02-19 (×10): qty 1

## 2024-02-19 MED ORDER — PANTOPRAZOLE SODIUM 40 MG IV SOLR
40.0000 mg | INTRAVENOUS | Status: DC
  Administered 2024-02-19 – 2024-02-20 (×2): 40 mg via INTRAVENOUS
  Filled 2024-02-19 (×3): qty 10

## 2024-02-19 MED ORDER — NICOTINE 21 MG/24HR TD PT24
21.0000 mg | MEDICATED_PATCH | Freq: Every day | TRANSDERMAL | Status: DC
  Administered 2024-02-19 – 2024-02-24 (×6): 21 mg via TRANSDERMAL
  Filled 2024-02-19 (×6): qty 1

## 2024-02-19 MED ORDER — METOPROLOL TARTRATE 25 MG PO TABS
25.0000 mg | ORAL_TABLET | Freq: Two times a day (BID) | ORAL | Status: DC
  Administered 2024-02-19 – 2024-02-24 (×10): 25 mg via ORAL
  Filled 2024-02-19 (×10): qty 1

## 2024-02-19 MED ORDER — ACETAMINOPHEN 325 MG PO TABS
650.0000 mg | ORAL_TABLET | Freq: Four times a day (QID) | ORAL | Status: DC | PRN
  Administered 2024-02-19 – 2024-02-22 (×6): 650 mg via ORAL
  Filled 2024-02-19 (×6): qty 2

## 2024-02-19 MED ORDER — INSULIN ASPART 100 UNIT/ML IJ SOLN
0.0000 [IU] | Freq: Three times a day (TID) | INTRAMUSCULAR | Status: DC
  Administered 2024-02-19 (×4): 1 [IU] via SUBCUTANEOUS
  Administered 2024-02-20: 2 [IU] via SUBCUTANEOUS
  Administered 2024-02-20 – 2024-02-22 (×10): 1 [IU] via SUBCUTANEOUS
  Administered 2024-02-23 (×2): 2 [IU] via SUBCUTANEOUS
  Administered 2024-02-23 – 2024-02-24 (×2): 1 [IU] via SUBCUTANEOUS

## 2024-02-19 MED ORDER — PHENOBARBITAL 32.4 MG PO TABS
64.8000 mg | ORAL_TABLET | Freq: Two times a day (BID) | ORAL | Status: DC
  Administered 2024-02-19 – 2024-02-24 (×10): 64.8 mg via ORAL
  Filled 2024-02-19 (×10): qty 2

## 2024-02-19 MED ORDER — OXYCODONE HCL 5 MG PO TABS
5.0000 mg | ORAL_TABLET | ORAL | Status: DC | PRN
  Administered 2024-02-19 – 2024-02-23 (×18): 5 mg via ORAL
  Filled 2024-02-19 (×18): qty 1

## 2024-02-19 NOTE — Evaluation (Signed)
 Physical Therapy Re-Evaluation Patient Details Name: [name] MRN: [id number] DOB: 08-20-1961 Today's Date: 02/19/2024  History of Present Illness  Pt is a 63 y.o. male presenting 5/8 after a fall at home. UDS positive for benzodiazepines. EKG with sinus bradycardia. Head CT 5/8 showed 9 mm right subdural hematoma with 3 mm midline shift and small volume acute SAH. Pt on continuous EEG 5/14 with continued seizures. 5/13 CT head: Resolved left SDH, increased right SDH-11 mm. Small volume SAH. 5/27 pt with declining mental status and repeat head CT with progressed and now mixed density right SDH 14mm thickness with incr intracranial mass effect and L midline shift 9-45mm. S/p R sided burr holes and placement of subdural train for hamatoma evacuation 5/27. PMH significant for seizures and COPD, smoker.   Clinical Impression  Pt seen for re-evaluation after burr holes on 5/27. He presents with significant headache and generally only agreeable to limited session after encouragement. Pt able to assist with bed mobility and initial transfers, but needing modA of 2 to complete and was limited to brief stand at EOB due to LE weakness and dizziness. Pt will continue to benefit from skilled PT to progress functional strength, activity tolerance, stability, and endurance. Continue to recommend post-acute rehab <3hours/day after d/c.         If plan is discharge home, recommend the following: Two people to help with walking and/or transfers;Two people to help with bathing/dressing/bathroom;Assist for transportation;Direct supervision/assist for financial management;Direct supervision/assist for medications management;Assistance with feeding;Assistance with cooking/housework;Help with stairs or ramp for entrance;Supervision due to cognitive status   Can travel by private vehicle   No    Equipment Recommendations Wheelchair (measurements PT);Wheelchair cushion (measurements PT);Hoyer lift  Recommendations  for [employer]       Functional Status Assessment Patient has had a recent decline in their functional status and demonstrates the ability to make significant improvements in function in a reasonable and predictable amount of time.     Precautions / Restrictions Precautions Precautions: Fall Recall of Precautions/Restrictions: Impaired Precaution/Restrictions Comments: cortrak, subdural drain, foley Restrictions Weight Bearing Restrictions Per Provider Order: No      Mobility  Bed Mobility Overal bed mobility: Needs Assistance Bed Mobility: Rolling, Sidelying to Sit, Sit to Supine Rolling: Mod assist, Used rails Sidelying to sit: HOB elevated, Used rails, +2 for physical assistance, Mod assist   Sit to supine: Max assist, +2 for safety/equipment, +2 for physical assistance   General bed mobility comments: multimodal cues throughout with assist for roll, briunging BLE off EOB, and truncal elevation    Transfers Overall transfer level: Needs assistance Equipment used: 2 person hand held assist Transfers: Sit to/from Stand Sit to Stand: Mod assist, +2 safety/equipment, +2 physical assistance           General transfer comment: mod +2 for rise up from EOB with pt demonstrating fair initiation; asisst for anterior weight shift and rise. SPT deferred secondary to decr SBP and pt dizzy    Ambulation/Gait               General Gait Details: deferred due to dizziness with stand     Modified Rankin (Stroke Patients Only) Modified Rankin (Stroke Patients Only) Pre-Morbid Rankin Score: Moderate disability Modified Rankin: Severe disability     Balance Overall balance assessment: Needs assistance Sitting-balance support: Feet supported, Single extremity supported Sitting balance-Leahy Scale: Poor Sitting balance - Comments: mod A progressing to short bouts of CGA statically   Standing balance support: Bilateral upper  extremity supported, During functional  activity, Reliant on assistive device for balance Standing balance-Leahy Scale: Poor Standing balance comment: dependent on BUE support and modA of 2                             Pertinent Vitals/Pain Pain Assessment Pain Assessment: Faces Faces Pain Scale: Hurts little more Pain Location: "all over", L shoulder Pain Descriptors / Indicators: Aching, Sore Pain Intervention(s): Limited activity within patient's tolerance, Monitored during session, Repositioned    Home Living Family/patient expects to be discharged to:: Private residence Living Arrangements: Spouse/significant other Available Help at Discharge: Family;Available 24 hours/day Type of Home: House Home Access: Stairs to enter Entrance Stairs-Rails: Right Entrance Stairs-Number of Steps: 4   Home Layout: One level Home Equipment: Rollator (4 wheels);Cane - single point;BSC/3in1;Standard Environmental consultant;Shower seat Additional Comments: information gleaned from wife at admission    Prior Function Prior Level of Function : Patient poor historian/Family not available             Mobility Comments: reports rollator use, no family present to confirm. RN reports repeated falls ADLs Comments: wife states sponge bathing as showering had become too difficiult     Extremity/Trunk Assessment   Upper Extremity Assessment Upper Extremity Assessment: Defer to OT evaluation LUE Deficits / Details: Pt with decr initiation as compared to R. generally weak, and pain at shoulder with ROM. 2 IV sites at hand and radia wrist    Lower Extremity Assessment Lower Extremity Assessment: Generalized weakness    Cervical / Trunk Assessment Cervical / Trunk Assessment: Kyphotic  Communication   Communication Communication: Other (comment) (increased time for verbal expression)    Cognition Arousal: Alert, Lethargic Behavior During Therapy: Flat affect   PT - Cognitive impairments: Difficult to assess Difficult to assess due  to: Impaired communication, Level of arousal (limited participation from PT)                       Following commands: Impaired Following commands impaired: Follows one step commands with increased time, Follows one step commands inconsistently     Cueing Cueing Techniques: Verbal cues, Tactile cues, Gestural cues     General Comments General comments (skin integrity, edema, etc.): wife present and supportive        Assessment/Plan    PT Assessment Patient needs continued PT services  PT Problem List Decreased strength;Decreased range of motion;Decreased activity tolerance;Decreased balance;Decreased mobility;Decreased cognition;Decreased knowledge of use of DME;Decreased safety awareness;Decreased knowledge of precautions;Decreased coordination       PT Treatment Interventions DME instruction;Balance training;Modalities;Gait training;Neuromuscular re-education;Functional mobility training;Therapeutic activities;Therapeutic exercise;Manual techniques;Patient/family education;Cognitive remediation    PT Goals (Current goals can be found in the Care Plan section)  Acute Rehab PT Goals Patient Stated Goal: to improve pain and mobility PT Goal Formulation: With patient/family Time For Goal Achievement: 03/04/24 Potential to Achieve Goals: Fair    Frequency Min 2X/week     Co-evaluation PT/OT/SLP Co-Evaluation/Treatment: Yes Reason for Co-Treatment: Complexity of the patient's impairments (multi-system involvement);Necessary to address cognition/behavior during functional activity;For patient/therapist safety;To address functional/ADL transfers PT goals addressed during session: Mobility/safety with mobility;Balance OT goals addressed during session: ADL's and self-care       AM-PAC PT "6 Clicks" Mobility  Outcome Measure Help needed turning from your back to your side while in a flat bed without using bedrails?: A Lot Help needed moving from lying on your back to  sitting on the  side of a flat bed without using bedrails?: Total Help needed moving to and from a bed to a chair (including a wheelchair)?: Total Help needed standing up from a chair using your arms (e.g., wheelchair or bedside chair)?: Total Help needed to walk in hospital room?: Total Help needed climbing 3-5 steps with a railing? : Total 6 Click Score: 7    End of Session Equipment Utilized During Treatment: Gait belt Activity Tolerance: Patient tolerated treatment well Patient left: in bed;with call bell/phone within reach;with family/visitor present;with bed alarm set;with SCD's reapplied Nurse Communication: Mobility status PT Visit Diagnosis: Unsteadiness on feet (R26.81);Muscle weakness (generalized) (M62.81);History of falling (Z91.81);Difficulty in walking, not elsewhere classified (R26.2);Other symptoms and signs involving the nervous system (R29.898)    Time: 1610-9604 PT Time Calculation (min) (ACUTE ONLY): 32 min   Charges:   PT Evaluation $PT Eval Moderate Complexity: 1 Mod   PT General Charges $$ ACUTE PT VISIT: 1 Visit         Barnabas Booth, PT, DPT   Acute Rehabilitation Department Office [phone] Secure Chat Communication Preferred  [name] 02/19/2024, 4:24 PM

## 2024-02-19 NOTE — Anesthesia Postprocedure Evaluation (Signed)
 Anesthesia Post Note  Patient: [name]  Procedure(s) Performed: CRANIOTOMY HEMATOMA EVACUATION SUBDURAL (Right)     Patient location during evaluation: PACU Anesthesia Type: General Level of consciousness: lethargic Pain management: pain level controlled Vital Signs Assessment: post-procedure vital signs reviewed and stable Respiratory status: spontaneous breathing, nonlabored ventilation and respiratory function stable Cardiovascular status: blood pressure returned to baseline and stable Postop Assessment: no apparent nausea or vomiting Anesthetic complications: no   No notable events documented.              Ethelwyn Gilbertson,W. EDMOND

## 2024-02-19 NOTE — Progress Notes (Signed)
 NAME:  [name], MRN:  [id number], DOB:  Aug 05, 1961, LOS: 20 ADMISSION DATE:  01/30/2024, CONSULTATION DATE:  02/05/24 REFERRING MD:  [facility], CHIEF COMPLAINT:  fall hypothermia    History of Present Illness:  63 year old man who was brought in by EMS from home after a fall.  Apparently wife called, patient is currently ANO x 1 and history obtained after chart review, unable to reach wife.  His heart rate initially was in the 80s, A/O x 1 per EMS, bradycardic to 40s in the ED , noted to be hypothermic 94.  Bruises of multiple ages left hip left eye and left shoulder Labs significant for no leukocytosis, hemoglobin 9.3, mild thrombocytopenia 1 27K, mild hypokalemia 3.3, hypomagnesemia 1.5, normal LFTs.  UDS positive for benzodiazepines, tox screen negative, ammonia 19, TSH 48.  EKG showed sinus bradycardia He was given 1 g of magnesium , 100 mg of IV Solu-Cortef  and 200 mics of levothyroxine  IV Head CT showed 9 mm right subdural hematoma with 3 mm midline shift and small volume acute SAH, cervical spine was negative. PCCM consulted for admission 02/05/2024 pulmonary critical care reconsulted for increased seizure activity and concern that his respiratory status may worsen with phenobarbital . S/o 5/15  5/27 PCCM re consulted post op day  0 burr holes SDH, with plan for ICU transfer   Pertinent  Medical History  Sz disorder COPD anxiety hypothyroidism   Significant Hospital Events: Including procedures, antibiotic start and stop dates in addition to other pertinent events   5/8 Admitted 5/9 No overnight issues, patient remained confused and restless, trying to get out of bed 5/10 ongoing issues with agitation overnight 5/14: PCCM was consulted 5/15: On RA and feels better. SpO2 93% 5/23 FUO, ID consult PICC removed   5/27: declining mental status, incr midline shift. OR for crani and IAC/[employer]  Interim History / Subjective:   Having a headache Some concern for dysphagia yesterday with  pills. Took them ok crushed this am.  Wants nicotine  patch Objective    Blood pressure (!) 133/57, pulse 76, temperature 99 F (37.2 C), temperature source Oral, resp. rate 14, height 5\' 6"  (1.676 m), weight 85.8 kg, SpO2 93%.        Intake/Output Summary (Last 24 hours) at 02/19/2024 0935 Last data filed at 02/19/2024 0600 Gross per 24 hour  Intake 1100.9 ml  Output 775 ml  Net 325.9 ml   Filed Weights   02/18/24 0500 02/18/24 1230 02/19/24 0500  Weight: 82.2 kg 82.2 kg 85.8 kg    Examination: Gen: Chronically ill  HEENT: EGD in place with sanguinous drainage CV: RRR no mrg Resp: breath sounds diminished, frequent moist cough Abd: soft, nontender Ext: no edema Neuro: normal speech, alert and oriented, moves all 4 extremities  Resolved problem list   Assessment and Plan    Traumatic SDH/SAH with brain compression Seizure due to above  -s/p burr hole, drain P -post op, drain, imaging per NSGY  -follow neuro exams -SBP <  140 -- has PRNs ordered  -PRN analgesia -PT/OT when appropriate -hopefully diet later today - pending SLP eval -holding vte ppx  -cont phenobarb + Vimpat  -- will need outpt neuro follow up  - needs to stay in ICU while EVD in place  RUE DVT -not a candidate for therapeutic AC-- was on ppx but now held w worsening SDH   HTN  -goal SBP < 140   COPD Tobacco use -BDs - nicotine  patch  Severe hypothyroidism  -continue synthroid   Best Practice (right click and "Reselect all SmartList Selections" daily)   Diet/type: NPO except meds, SLP ordered  DVT prophylaxis SCD Pressure ulcer(s): pressure ulcer assessment deferred  GI prophylaxis: N/A Lines: N/A Foley:  Yes, and it is still needed Code Status:  full code Last date of multidisciplinary goals of care discussion [ wife updated at bedside]  Louie Rover, MD Pulmonary and Critical Care Medicine [facility] 02/19/2024 10:12 AM Pager: see AMION  If no response to pager,  please call critical care on call (see AMION) until 7pm After 7:00 pm call Elink     Labs   CBC: Recent Labs  Lab 02/13/24 0215 02/17/24 0419  WBC 9.7 6.0  HGB 7.9* 9.3*  HCT 23.8* 27.8*  MCV 96.0 96.5  PLT 282 303    Basic Metabolic Panel: Recent Labs  Lab 02/13/24 0215  NA 133*  K 3.6  CL 104  CO2 22  GLUCOSE 127*  BUN 20  CREATININE 0.60*  CALCIUM  8.5*  MG 1.8  PHOS 3.6   GFR: Estimated Creatinine Clearance: 94.5 mL/min (A) (by C-G formula based on SCr of 0.6 mg/dL (L)). Recent Labs  Lab 02/12/24 1118 02/13/24 0215 02/17/24 0419  PROCALCITON <0.10  --   --   WBC  --  9.7 6.0    Liver Function Tests: No results for input(s): "AST", "ALT", "ALKPHOS", "BILITOT", "PROT", "ALBUMIN" in the last 168 hours. No results for input(s): "LIPASE", "AMYLASE" in the last 168 hours. No results for input(s): "AMMONIA" in the last 168 hours.  ABG    Component Value Date/Time   PHART 7.49 (H) 02/05/2024 1530   PCO2ART 37 02/05/2024 1530   PO2ART 91 02/05/2024 1530   HCO3 28.2 (H) 02/05/2024 1530   TCO2 29 01/22/2016 2138   O2SAT 98.8 02/05/2024 1530     Coagulation Profile: No results for input(s): "INR", "PROTIME" in the last 168 hours.  Cardiac Enzymes: No results for input(s): "CKTOTAL", "CKMB", "CKMBINDEX", "TROPONINI" in the last 168 hours.  HbA1C: Hgb A1c MFr Bld  Date/Time Value Ref Range Status  01/31/2024 05:28 AM 6.1 (H) 4.8 - 5.6 % Final    Comment:    (NOTE) Pre diabetes:          5.7%-6.4%  Diabetes:              >6.4%  Glycemic control for   <7.0% adults with diabetes     CBG: Recent Labs  Lab 02/18/24 0737 02/18/24 1115 02/18/24 1448 02/18/24 2131 02/19/24 0806  GLUCAP 137* 185* 125* 111* 138*

## 2024-02-19 NOTE — Plan of Care (Signed)
  Problem: Metabolic: Goal: Ability to maintain appropriate glucose levels will improve Outcome: Progressing   Problem: Clinical Measurements: Goal: Ability to maintain clinical measurements within normal limits will improve Outcome: Progressing

## 2024-02-19 NOTE — Progress Notes (Signed)
    Providing Compassionate, Quality Care - Together   NEUROSURGERY PROGRESS NOTE     S: No issues overnight.    O: EXAM:  BP 130/67   Pulse 86   Temp 99 F (37.2 C) (Oral)   Resp (!) 21   Ht 5\' 6"  (1.676 m)   Wt 85.8 kg   SpO2 93%   BMI 30.53 kg/m     Awake, alert Speech fluent, appropriate  CNs grossly intact  Working w/ therapies, sitting at bedside Dressing c/d/I Drain in place   ASSESSMENT:  64 y.o. with R SDH s/p burr holes    PLAN: -Supportive care -Therapies as tolerated -Likely stepdown tmrw after dc of drain -Call w/ questions/concerns.   Easter Golden, [facility]

## 2024-02-19 NOTE — Progress Notes (Signed)
 Nutrition Follow-up  DOCUMENTATION CODES:   Not applicable  INTERVENTION:   Encourage PO intake Room service with assist  MVI with minerals daily  Monitor for diet advancement and tolerance Continue Ensure Enlive po BID, each supplement provides 350 kcal and 20 grams of protein.  Continue Magic cup TID with meals, each supplement provides 290 kcal and 9 grams of protein  Trial Prosource Plus 30 ml BID, each supplement contains 100 kcal, 15 gm protein   NUTRITION DIAGNOSIS:   Increased nutrient needs related to chronic illness (COPD) as evidenced by estimated needs.  - Still applicable   GOAL:   Patient will meet greater than or equal to 90% of their needs  - Progressing   MONITOR:   PO intake, Supplement acceptance, Diet advancement  REASON FOR ASSESSMENT:   Consult Enteral/tube feeding initiation and management  ASSESSMENT:   Pt with PMH of COPD and seizure disorder admitted after falling at home dx with acute R SDH with 3 mm midline shift and acute traumatic SAH.  5/9 - admitted; s/p cortrak placement; tip in gastric fundus  5/10 - Cortrak dislodged and NGT placed 5/11 - txr to 5W 5/13 - NGT removed 5/14 - Cortrak placed d/t decline in status, NPO d/t potential for pending surgery 5/15 - TF re-initiated at goal rate 5/15 - EEG: suggestive of cortical dysfunction arising from R hemisphere, mild diffuse encephalopathy 5/17 - LTM EEG discontinued 5/18 - 10 blood cultures: no growth 5/19 - MBS: dysphagia 2, thin liquids, nocturnal feedings to provide 50% of estimated needs 5/20 - Cortrak removed d/t adequate intake overnight 5/23 - diet advanced to DYS3/thins 5/27: declining mental status CT head showed SDH worse-mass-effect. S/p right sided burr holes and placement of subdural drain for evacuation of subdural hematoma    Pt downgraded to FLD last night due to pt coughing when taking pills. Spoke to RN at bedside and reports pills were not crushed last night. Pt  has been taking his pills crushed well today and no signs of dysphagia when swallowing liquids. Messaged attending, ok with advancing diet back to Dysphagia 3, thins. RD ordered house tray for pt.   Pt slightly confused on visit. RN reports he has been refusing Ensures and taking bites of the magic cup. She is unsure how he has been eating in the days prior, no meals documented. NPO yesterday for OR. EVD still in place.   Admit weight: 93.7 kg Current weight: 85.8 kg    Average Meal Intake: 5/19: 100% x 1 meal 5/21: 75% x 1 meal 5/22: 75% x 1 meal 5/23: 75% x 1 meal   Nutritionally Relevant Medications: Scheduled Meds:  [START ON 02/20/2024] (feeding supplement) PROSource Plus  30 mL Oral BID BM   ALPRAZolam   0.5 mg Oral BID   arformoterol   15 mcg Nebulization BID   budesonide  (PULMICORT ) nebulizer solution  0.25 mg Nebulization BID   Chlorhexidine  Gluconate Cloth  6 each Topical Daily   feeding supplement  237 mL Oral BID BM   insulin  aspart  0-9 Units Subcutaneous TID AC & HS   lacosamide   200 mg Oral BID   levothyroxine   150 mcg Oral Q0600   metoprolol  tartrate  25 mg Oral BID   multivitamin with minerals  1 tablet Oral Daily   nicotine   21 mg Transdermal Daily   mouth rinse  15 mL Mouth Rinse 4 times per day   pantoprazole  (PROTONIX ) IV  40 mg Intravenous Q24H   phenobarbital   64.8 mg  Oral BID   revefenacin   175 mcg Nebulization Daily   sodium chloride  flush  10-40 mL Intracatheter Q12H   sodium chloride   1 g Oral BID   Continuous Infusions:  niCARDipine  Stopped (02/18/24 1900)   Labs Reviewed: No recent labs  CBG ranges from 111-185 mg/dL over the last 24 hours HgbA1c 6.1   Diet Order:   Diet Order             DIET DYS 3 Room service appropriate? Yes with Assist; Fluid consistency: Thin  Diet effective now                   EDUCATION NEEDS:   Education needs have been addressed  Skin:  Skin Assessment: Skin Integrity Issues: Skin Integrity Issues::  DTI, Stage II DTI: coccyx Stage II: sacrum  Last BM:  02/19/24  Height:   Ht Readings from Last 1 Encounters:  02/18/24 5\' 6"  (1.676 m)    Weight:   Wt Readings from Last 1 Encounters:  02/19/24 85.8 kg    BMI:  Body mass index is 30.53 kg/m.  Estimated Nutritional Needs:   Kcal:  2000-2300  Protein:  105-125 grams  Fluid:  > 2 L/day   Frederik Jansky, RD Registered Dietitian  See Amion for more information

## 2024-02-19 NOTE — Progress Notes (Signed)
 Occupational Therapy Evaluation Patient Details Name: [name] MRN: [id number] DOB: 03-05-1961 Today's Date: 02/19/2024   History of Present Illness   Pt is a 63 y.o. male presenting 5/8 after a fall at home. UDS positive for benzodiazepines. EKG with sinus bradycardia. Head CT 5/8 showed 9 mm right subdural hematoma with 3 mm midline shift and small volume acute SAH. Pt on continuous EEG 5/14 with continued seizures. 5/13 CT head: Resolved left SDH, increased right SDH-11 mm. Small volume SAH. 5/27 pt with declining mental status and repeat head CT with progressed and now mixed density right SDH 14mm thickness with incr intracranial mass effect and L midline shift 9-58mm. S/p R sided burr holes and placement of subdural train for hamatoma evacuation. PMH significant for seizures and COPD, smoker.     Clinical Impressions Pt re-evaluated s/p events on 5/27. Upon re-eval, pt lethargic, but follows commands on R and L side (R>L), maintaining eyes closed for most of session. Pt needing increased time to complete tasks and able to perform bed mobility and STS transfers this session with +2 A. Pt with drop in SBP by ~20 points with prolonged sitting EOB. Will continue to follow. Discharge recommendation remains appropriate. Will continue to follow.      If plan is discharge home, recommend the following:   Two people to help with walking and/or transfers;Two people to help with bathing/dressing/bathroom;Assist for transportation;Help with stairs or ramp for entrance;Direct supervision/assist for medications management;Direct supervision/assist for financial management;Assistance with cooking/housework;Assistance with feeding     Functional Status Assessment   Patient has had a recent decline in their functional status and demonstrates the ability to make significant improvements in function in a reasonable and predictable amount of time.     Equipment Recommendations   Other (comment)  (TBD)     Recommendations for Other Services         Precautions/Restrictions   Precautions Precautions: Fall Recall of Precautions/Restrictions: Impaired Precaution/Restrictions Comments: cortrak, subdural drain, foley Restrictions Weight Bearing Restrictions Per Provider Order: No     Mobility Bed Mobility Overal bed mobility: Needs Assistance Bed Mobility: Rolling, Sidelying to Sit, Sit to Supine Rolling: Mod assist, Used rails Sidelying to sit: HOB elevated, Used rails, +2 for physical assistance, Mod assist   Sit to supine: Max assist, +2 for safety/equipment, +2 for physical assistance   General bed mobility comments: multimodal cues throughout with assist for roll, briunging BLE off EOB, and truncal elevation    Transfers Overall transfer level: Needs assistance Equipment used: 2 person hand held assist Transfers: Sit to/from Stand Sit to Stand: Mod assist, +2 safety/equipment, +2 physical assistance           General transfer comment: mod +2 for rise up from EOB with pt demonstrating fair initiation; asisst for anterior weight shift and rise. SPT deferred secondary to decr SBP and pt dizzy      Balance Overall balance assessment: Needs assistance Sitting-balance support: Feet supported, Single extremity supported Sitting balance-Leahy Scale: Poor Sitting balance - Comments: mod A progressing to short bouts of CGA statically   Standing balance support: Bilateral upper extremity supported, During functional activity, Reliant on assistive device for balance Standing balance-Leahy Scale: Poor                             ADL either performed or assessed with clinical judgement   ADL Overall ADL's : Needs assistance/impaired Eating/Feeding: NPO   Grooming: Set up;Bed  level Grooming Details (indicate cue type and reason): increased time to reposition wash cloth in hand and initiate. Questionably perseverative, but wife also reporting that pt  likes cloth left on face in light of headache and light sensitivity                 Toilet Transfer: Moderate assistance;+2 for physical assistance;+2 for safety/equipment           Functional mobility during ADLs: Moderate assistance;+2 for safety/equipment;+2 for physical assistance       Vision Baseline Vision/History: 4 Cataracts Ability to See in Adequate Light: 3 Highly impaired Patient Visual Report: No change from baseline (unable to report if different from baseline.) Vision Assessment?: Vision impaired- to be further tested in functional context Additional Comments: poor vision at baseline per wife. pt able to report how many fingers OT is holding up at midline, but otherwise not maintaining eyes open long enough to demo other visual skills     Perception         Praxis         Pertinent Vitals/Pain Pain Assessment Pain Assessment: Faces Faces Pain Scale: Hurts little more Pain Location: "all over", L shoulder Pain Descriptors / Indicators: Aching, Sore Pain Intervention(s): Limited activity within patient's tolerance, Monitored during session     Extremity/Trunk Assessment Upper Extremity Assessment Upper Extremity Assessment: Generalized weakness;LUE deficits/detail;Difficult to assess due to impaired cognition LUE Deficits / Details: Pt with decr initiation as compared to R. generally weak, and pain at shoulder with ROM. 2 IV sites at hand and radia wrist   Lower Extremity Assessment Lower Extremity Assessment: Defer to PT evaluation   Cervical / Trunk Assessment Cervical / Trunk Assessment: Kyphotic   Communication Communication Communication: Other (comment) (increased time for verbal expression)   Cognition Arousal: Alert, Lethargic Behavior During Therapy: Flat affect Cognition: Cognition impaired     Awareness: Intellectual awareness impaired, Online awareness impaired Memory impairment (select all impairments): Short-term memory,  Working [employer] fast streamer, Engineer, structural memory Attention impairment (select first level of impairment): Focused attention Executive functioning impairment (select all impairments): Sequencing, Reasoning, Problem solving, Initiation OT - Cognition Comments: kept eyes closed for most of session with frequent cues to keep open. Often repeated questions and phrases with pt needing increased time for opportunity to respond.                 Following commands: Impaired Following commands impaired: Follows one step commands with increased time, Follows one step commands inconsistently     Cueing  General Comments   Cueing Techniques: Verbal cues;Tactile cues;Gestural cues  wife present and supportive   Exercises     Shoulder Instructions      Home Living Family/patient expects to be discharged to:: Private residence Living Arrangements: Spouse/significant other Available Help at Discharge: Family;Available 24 hours/day Type of Home: House Home Access: Stairs to enter [employer] of Steps: 4 Entrance Stairs-Rails: Right Home Layout: One level     Bathroom Shower/Tub: Chief Strategy Officer: Standard     Home Equipment: Rollator (4 wheels);Cane - single point;BSC/3in1;Standard Environmental consultant;Shower seat   Additional Comments: information gleaned from wife at admission  Lives With: Spouse    Prior Functioning/Environment Prior Level of Function : Patient poor historian/Family not available             Mobility Comments: reports rollator use, no family present to confirm. RN reports repeated falls ADLs Comments: wife states sponge bathing as showering had become too difficiult  OT Problem List: Decreased strength;Decreased activity tolerance;Impaired balance (sitting and/or standing);Impaired vision/perception;Decreased cognition;Decreased safety awareness;Decreased knowledge of use of DME or AE   OT Treatment/Interventions: Self-care/ADL  training;Therapeutic exercise;DME and/or AE instruction;Balance training;Patient/family education;Therapeutic activities;Cognitive remediation/compensation      OT Goals(Current goals can be found in the care plan section)   Acute Rehab OT Goals OT Goal Formulation: Patient unable to participate in goal setting Time For Goal Achievement: 03/04/24 Potential to Achieve Goals: Fair   OT Frequency:  Min 1X/week    Co-evaluation PT/OT/SLP Co-Evaluation/Treatment: Yes Reason for Co-Treatment: Complexity of the patient's impairments (multi-system involvement);Necessary to address cognition/behavior during functional activity;For patient/therapist safety;To address functional/ADL transfers PT goals addressed during session: Mobility/safety with mobility;Balance OT goals addressed during session: ADL's and self-care      AM-PAC OT "6 Clicks" Daily Activity     Outcome Measure Help from another person eating meals?: Total Help from another person taking care of personal grooming?: A Lot Help from another person toileting, which includes using toliet, bedpan, or urinal?: Total Help from another person bathing (including washing, rinsing, drying)?: A Lot Help from another person to put on and taking off regular upper body clothing?: A Lot Help from another person to put on and taking off regular lower body clothing?: Total 6 Click Score: 9   End of Session Equipment Utilized During Treatment: Gait belt;Rolling walker (2 wheels) Nurse Communication: Mobility status  Activity Tolerance: Patient limited by lethargy Patient left: in bed;with call bell/phone within reach;with bed alarm set;with family/visitor present  OT Visit Diagnosis: Unsteadiness on feet (R26.81);Muscle weakness (generalized) (M62.81);Other symptoms and signs involving cognitive function;Low vision, both eyes (H54.2)                Time: 0830-0900 OT Time Calculation (min): 30 min Charges:  OT General Charges $OT Visit:  1 Visit OT Evaluation $OT Re-eval: 1 Re-eval  Karilyn Ouch, OTR/L [facility] Acute Rehabilitation Office: [phone]   Emery Hans 02/19/2024, 1:53 PM

## 2024-02-20 DIAGNOSIS — I82621 Acute embolism and thrombosis of deep veins of right upper extremity: Secondary | ICD-10-CM | POA: Diagnosis not present

## 2024-02-20 DIAGNOSIS — S065XAA Traumatic subdural hemorrhage with loss of consciousness status unknown, initial encounter: Secondary | ICD-10-CM | POA: Diagnosis not present

## 2024-02-20 DIAGNOSIS — Z720 Tobacco use: Secondary | ICD-10-CM | POA: Diagnosis not present

## 2024-02-20 LAB — GLUCOSE, CAPILLARY
Glucose-Capillary: 168 mg/dL — ABNORMAL HIGH (ref 70–99)
Glucose-Capillary: 128 mg/dL — ABNORMAL HIGH (ref 70–99)
Glucose-Capillary: 122 mg/dL — ABNORMAL HIGH (ref 70–99)
Glucose-Capillary: 132 mg/dL — ABNORMAL HIGH (ref 70–99)

## 2024-02-20 NOTE — Progress Notes (Signed)
 Occupational Therapy Treatment Patient Details Name: [name] MRN: [id number] DOB: 1960/11/06 Today's Date: 02/20/2024   History of present illness Pt is a 63 y.o. male presenting 5/8 after a fall at home. UDS positive for benzodiazepines. EKG with sinus bradycardia. Head CT 5/8 showed 9 mm right subdural hematoma with 3 mm midline shift and small volume acute SAH. Pt on continuous EEG 5/14 with continued seizures. 5/13 CT head: Resolved left SDH, increased right SDH-11 mm. Small volume SAH. 5/27 pt with declining mental status and repeat head CT with progressed and now mixed density right SDH 14mm thickness with incr intracranial mass effect and L midline shift 9-34mm. S/p R sided burr holes and placement of subdural train for hamatoma evacuation 5/27. PMH significant for seizures and COPD, smoker.   OT comments  Patient with very good effort this session.  Able to sit EOB with Fair balance and Mod A.  Stood with close to Min A and able to take 4 short choppy steps to the recliner with Mod A.  Initial good step, then then needing tactile cues to weight shift and take an additional step with the L foot to sit.  Continued headache, but it did not get worse with standing.  OT to continue efforts in the acute setting to address deficits, and Patient will benefit from continued inpatient follow up therapy, <3 hours/day.      If plan is discharge home, recommend the following:  Two people to help with walking and/or transfers;Two people to help with bathing/dressing/bathroom;Assist for transportation;Help with stairs or ramp for entrance;Direct supervision/assist for medications management;Direct supervision/assist for financial management;Assistance with cooking/housework;Assistance with feeding   Equipment Recommendations       Recommendations for Other Services      Precautions / Restrictions Precautions Precautions: Fall Recall of Precautions/Restrictions: Impaired Precaution/Restrictions  Comments: subdural drain, foley Restrictions Weight Bearing Restrictions Per Provider Order: No       Mobility Bed Mobility Overal bed mobility: Needs Assistance Bed Mobility: Supine to Sit     Supine to sit: Mod assist          Transfers Overall transfer level: Needs assistance Equipment used: 1 person hand held assist Transfers: Sit to/from Stand, Bed to chair/wheelchair/BSC Sit to Stand: Mod assist     Step pivot transfers: Mod assist           Balance Overall balance assessment: Needs assistance Sitting-balance support: Feet supported, Single extremity supported Sitting balance-Leahy Scale: Fair     Standing balance support: Bilateral upper extremity supported Standing balance-Leahy Scale: Poor                             ADL either performed or assessed with clinical judgement   ADL       Grooming: Minimal assistance;Sitting                                      Extremity/Trunk Assessment Upper Extremity Assessment Upper Extremity Assessment: Generalized weakness       Cervical / Trunk Assessment Cervical / Trunk Assessment: Kyphotic    Vision Baseline Vision/History: 4 Cataracts Ability to See in Adequate Light: 3 Highly impaired Patient Visual Report: No change from baseline     Perception     Praxis     Communication Communication Communication: Impaired   Cognition Arousal: Alert Behavior During Therapy: Flat affect  Cognition: Cognition impaired       Memory impairment (select all impairments): Short-term memory, Working [employer] fast streamer, Armed forces training and education officer functioning impairment (select all impairments): Sequencing, Reasoning, Problem solving, Initiation                   Following commands: Impaired Following commands impaired: Follows one step commands with increased time      Cueing   Cueing Techniques: Verbal cues, Tactile cues, Gestural cues  Exercises      Shoulder  Instructions       General Comments      Pertinent Vitals/ Pain       Pain Assessment Pain Assessment: Faces Faces Pain Scale: Hurts even more Pain Location: headache Pain Descriptors / Indicators: Headache Pain Intervention(s): Monitored during session, Premedicated before session                                                          Frequency  Min 2X/week        Progress Toward Goals  OT Goals(current goals can now be found in the care plan section)  Progress towards OT goals: Progressing toward goals  Acute Rehab OT Goals Time For Goal Achievement: 03/05/24 Potential to Achieve Goals: Good  Plan      Co-evaluation                 AM-PAC OT "6 Clicks" Daily Activity     Outcome Measure   Help from another person eating meals?: A Lot Help from another person taking care of personal grooming?: A Little Help from another person toileting, which includes using toliet, bedpan, or urinal?: A Lot Help from another person bathing (including washing, rinsing, drying)?: A Lot Help from another person to put on and taking off regular upper body clothing?: A Lot Help from another person to put on and taking off regular lower body clothing?: A Lot 6 Click Score: 13    End of Session    OT Visit Diagnosis: Unsteadiness on feet (R26.81);Muscle weakness (generalized) (M62.81);Other symptoms and signs involving cognitive function;Low vision, both eyes (H54.2)   Activity Tolerance Patient tolerated treatment well   Patient Left in chair;with call bell/phone within reach;with bed alarm set;with family/visitor present   Nurse Communication Mobility status        Time: 6578-4696 OT Time Calculation (min): 25 min  Charges: OT General Charges $OT Visit: 1 Visit OT Treatments $Self Care/Home Management : 8-22 mins $Therapeutic Activity: 8-22 mins  02/20/2024  RP, OTR/L  Acute Rehabilitation Services  Office:   [phone]   [name] 02/20/2024, 9:18 AM

## 2024-02-20 NOTE — Progress Notes (Signed)
 NAME:  [name], MRN:  [id number], DOB:  02-02-1961, LOS: 21 ADMISSION DATE:  01/30/2024, CONSULTATION DATE:  02/05/24 REFERRING MD:  [facility], CHIEF COMPLAINT:  fall hypothermia    History of Present Illness:  63 year old man who was brought in by EMS from home after a fall.  Apparently wife called, patient is currently ANO x 1 and history obtained after chart review, unable to reach wife.  His heart rate initially was in the 80s, A/O x 1 per EMS, bradycardic to 40s in the ED , noted to be hypothermic 94.  Bruises of multiple ages left hip left eye and left shoulder Labs significant for no leukocytosis, hemoglobin 9.3, mild thrombocytopenia 1 27K, mild hypokalemia 3.3, hypomagnesemia 1.5, normal LFTs.  UDS positive for benzodiazepines, tox screen negative, ammonia 19, TSH 48.  EKG showed sinus bradycardia He was given 1 g of magnesium , 100 mg of IV Solu-Cortef  and 200 mics of levothyroxine  IV Head CT showed 9 mm right subdural hematoma with 3 mm midline shift and small volume acute SAH, cervical spine was negative. PCCM consulted for admission 02/05/2024 pulmonary critical care reconsulted for increased seizure activity and concern that his respiratory status may worsen with phenobarbital . S/o 5/15  5/27 PCCM re consulted post op day  0 burr holes SDH, with plan for ICU transfer   Pertinent  Medical History  Sz disorder COPD anxiety hypothyroidism   Significant Hospital Events: Including procedures, antibiotic start and stop dates in addition to other pertinent events   5/8 Admitted 5/9 No overnight issues, patient remained confused and restless, trying to get out of bed 5/10 ongoing issues with agitation overnight 5/14: PCCM was consulted 5/15: On RA and feels better. SpO2 93% 5/23 FUO, ID consult PICC removed   5/27: declining mental status, incr midline shift. OR for crani and IAC/[employer]  Interim History / Subjective:   No overnight issues. Cough with mucus production.  Eating with  assistance  Objective    Blood pressure 131/68, pulse 76, temperature 98.1 F (36.7 C), temperature source Axillary, resp. rate 13, height 5\' 6"  (1.676 m), weight 85.5 kg, SpO2 95%.        Intake/Output Summary (Last 24 hours) at 02/20/2024 0813 Last data filed at 02/20/2024 0600 Gross per 24 hour  Intake 203.26 ml  Output 885 ml  Net -681.74 ml   Filed Weights   02/18/24 1230 02/19/24 0500 02/20/24 0500  Weight: 82.2 kg 85.8 kg 85.5 kg    Examination: Chronically ill appears older than stated ate Right EVD dressing CDI, only 35 cc out in the last 24 hours On nasal cannula, coarse bilaterally rhonchi with weak cough, breathing non labored RRR no mrg Alert to self and person but not to situation Globally weak    Resolved problem list   Assessment and Plan    Traumatic SDH/SAH with brain compression Seizure due to above  -s/p burr hole, drain P -post op, drain, imaging per NSGY  -follow neuro exams -SBP <  140 -- has PRNs ordered  -PRN analgesia -PT/OT when appropriate -hopefully diet later today - pending SLP eval -holding vte ppx  -cont phenobarb + Vimpat  -- will need outpt neuro follow up  - needs to stay in ICU while EVD in place. Possible D/c today and transfer to progressive  RUE DVT -not a candidate for therapeutic AC-- was on ppx but now held w worsening SDH   HTN  -goal SBP < 140   COPD Tobacco use -Bds prn -  nicotine  patch  Severe hypothyroidism  -continue synthroid     Best Practice (right click and "Reselect all SmartList Selections" daily)   Diet/type: Regular consistency (see orders)  DVT prophylaxis SCD Pressure ulcer(s): pressure ulcer assessment deferred  GI prophylaxis: N/A Lines: N/A Foley:  Yes, and it is still needed Code Status:  full code Last date of multidisciplinary goals of care discussion [wife updated at bedside 5/29]  Louie Rover, MD Pulmonary and Critical Care Medicine [facility] 02/20/2024 8:13 AM Pager:  see AMION  If no response to pager, please call critical care on call (see AMION) until 7pm After 7:00 pm call Elink     Labs   CBC: Recent Labs  Lab 02/17/24 0419  WBC 6.0  HGB 9.3*  HCT 27.8*  MCV 96.5  PLT 303    Basic Metabolic Panel: No results for input(s): "NA", "K", "CL", "CO2", "GLUCOSE", "BUN", "CREATININE", "CALCIUM ", "MG", "PHOS" in the last 168 hours.  GFR: Estimated Creatinine Clearance: 94.4 mL/min (A) (by C-G formula based on SCr of 0.6 mg/dL (L)). Recent Labs  Lab 02/17/24 0419  WBC 6.0    Liver Function Tests: No results for input(s): "AST", "ALT", "ALKPHOS", "BILITOT", "PROT", "ALBUMIN" in the last 168 hours. No results for input(s): "LIPASE", "AMYLASE" in the last 168 hours. No results for input(s): "AMMONIA" in the last 168 hours.  ABG    Component Value Date/Time   PHART 7.49 (H) 02/05/2024 1530   PCO2ART 37 02/05/2024 1530   PO2ART 91 02/05/2024 1530   HCO3 28.2 (H) 02/05/2024 1530   TCO2 29 01/22/2016 2138   O2SAT 98.8 02/05/2024 1530     Coagulation Profile: No results for input(s): "INR", "PROTIME" in the last 168 hours.  Cardiac Enzymes: No results for input(s): "CKTOTAL", "CKMB", "CKMBINDEX", "TROPONINI" in the last 168 hours.  HbA1C: Hgb A1c MFr Bld  Date/Time Value Ref Range Status  01/31/2024 05:28 AM 6.1 (H) 4.8 - 5.6 % Final    Comment:    (NOTE) Pre diabetes:          5.7%-6.4%  Diabetes:              >6.4%  Glycemic control for   <7.0% adults with diabetes     CBG: Recent Labs  Lab 02/18/24 2131 02/19/24 0806 02/19/24 1115 02/19/24 1639 02/19/24 2152  GLUCAP 111* 138* 135* 150* 142*

## 2024-02-20 NOTE — TOC Progression Note (Signed)
 Transition of Care [facility]) - Progression Note    Patient Details  Name: [name] MRN: [id number] Date of Birth: 10-01-1960  Transition of Care [facility]) CM/SW Contact  Jannice Mends, LCSW Phone Number: 02/20/2024, 11:40 AM  Clinical Narrative:    TOC continuing to follow.    Expected Discharge Plan: Skilled Nursing Facility Barriers to Discharge: Continued Medical Work up  Expected Discharge Plan and Services In-house Referral: Clinical Social Work   Post Acute Care Choice: Skilled Nursing Facility Living arrangements for the past 2 months: Single Family Home                                       Social Determinants of Health (SDOH) Interventions SDOH Screenings   Food Insecurity: Patient Unable To Answer (02/02/2024)  Housing: Patient Unable To Answer (02/02/2024)  Transportation Needs: Patient Unable To Answer (02/02/2024)  Utilities: Patient Unable To Answer (02/02/2024)  Financial Resource Strain: Low Risk  (12/31/2022)   Received from [facility]  Physical Activity: Unknown (03/27/2023)   Received from [facility]  Social Connections: Somewhat Isolated (03/27/2023)   Received from [facility]  Stress: Stress Concern Present (03/27/2023)   Received from [facility]  Tobacco Use: High Risk (02/18/2024)    Readmission Risk Interventions     No data to display

## 2024-02-20 NOTE — Progress Notes (Addendum)
 Speech Language Pathology Treatment: Dysphagia  Patient Details Name: [name] MRN: [id number] DOB: Dec 11, 1960 Today's Date: 02/20/2024 Time: 4098-1191 SLP Time Calculation (min) (ACUTE ONLY): 12 min  Assessment / Plan / Recommendation Clinical Impression  Attempted to see pt during breakfast meal but he had already finished and his tray was cleared. He and his wife deny any difficulties with solids. She said he ate everything except for the magic cup and a few bites of sausage that had fallen off his tray. She also denies any trouble with chewing. RN agrees that he has been doing well with meals, but that there had been a coughing episode when taking pills whole. They have subsequently been crushing them without further incidence. This morning while SLP was present pt consumed thin liquids via straw as well as bites of puree with crushed medications. He had occasional delayed throat clear/cough but this was noted on MBS in the absence of aspiration, and therefore is not as reliable of a clinical indicator for decreased airway protection. Pt refused any other solids offered, so will continue efforts to f/u during meal time.     HPI HPI: Pt is a 63 y.o. male who was brought to ED by EMS from home after a fall. CT head (01/30/23) revealed "Slightly increased size of right larger than left subdural hematomas. Small volume subarachnoid hemorrhage with mildly increased hemorrhage in the left lateral ventricle. Failed yale 02/01/24. S/P OR 5/27 for burr holes. PMH: Seizure disorder, COPD; ST f/u for po readiness in acute setting; pt currently has TF for nutritive/hydration purposes.      SLP Plan  Continue with current plan of care      Recommendations for follow up therapy are one component of a multi-disciplinary discharge planning process, led by the attending physician.  Recommendations may be updated based on patient status, additional functional criteria and insurance authorization.     Recommendations  Diet recommendations: Dysphagia 3 (mechanical soft);Thin liquid Liquids provided via: Cup;Straw Medication Administration: Whole meds with puree Supervision: Full supervision/cueing for compensatory strategies;Trained caregiver to feed patient Compensations: Slow rate;Small sips/bites;Minimize environmental distractions Postural Changes and/or Swallow Maneuvers: Seated upright 90 degrees                  Oral care BID;Staff/trained caregiver to provide oral care   Frequent or constant Supervision/Assistance Dysphagia, unspecified (R13.10)     Continue with current plan of care     Beth Brooke., M.A. CCC-SLP Acute Rehabilitation Services Office: [phone]  Secure chat preferred   02/20/2024, 9:46 AM

## 2024-02-20 NOTE — Progress Notes (Signed)
    Providing Compassionate, Quality Care - Together   NEUROSURGERY PROGRESS NOTE     S: No issues overnight.    O: EXAM:  BP 131/68 (BP Location: Left Arm)   Pulse 76   Temp 98.1 F (36.7 C) (Axillary)   Resp 13   Ht 5\' 6"  (1.676 m)   Wt 85.5 kg   SpO2 95%   BMI 30.42 kg/m     Awake, alert Speech fluent, appropriate  CNs grossly intact  MAEs Dressing c/d/I Drain removed at bedside w/o acute complication   ASSESSMENT:  63 y.o. with R SDH s/p burr holes     PLAN: -Stepdown today -Continue therapies as tolerated -Supportive care -Call w/ questions/concerns.   [name], [facility]

## 2024-02-21 ENCOUNTER — Inpatient Hospital Stay (HOSPITAL_COMMUNITY): Payer: Medicare (Managed Care)

## 2024-02-21 DIAGNOSIS — S065XAA Traumatic subdural hemorrhage with loss of consciousness status unknown, initial encounter: Secondary | ICD-10-CM | POA: Diagnosis not present

## 2024-02-21 LAB — CBC
HCT: 27.9 % — ABNORMAL LOW (ref 39.0–52.0)
Hemoglobin: 9.3 g/dL — ABNORMAL LOW (ref 13.0–17.0)
MCH: 32.6 pg (ref 26.0–34.0)
MCHC: 33.3 g/dL (ref 30.0–36.0)
MCV: 97.9 fL (ref 80.0–100.0)
Platelets: 282 10*3/uL (ref 150–400)
RBC: 2.85 MIL/uL — ABNORMAL LOW (ref 4.22–5.81)
RDW: 18.7 % — ABNORMAL HIGH (ref 11.5–15.5)
WBC: 5.8 10*3/uL (ref 4.0–10.5)
nRBC: 0 % (ref 0.0–0.2)

## 2024-02-21 LAB — COMPREHENSIVE METABOLIC PANEL WITH GFR
ALT: 43 U/L (ref 0–44)
AST: 28 U/L (ref 15–41)
Albumin: 3.3 g/dL — ABNORMAL LOW (ref 3.5–5.0)
Alkaline Phosphatase: 93 U/L (ref 38–126)
Anion gap: 5 (ref 5–15)
BUN: 21 mg/dL (ref 8–23)
CO2: 26 mmol/L (ref 22–32)
Calcium: 8.9 mg/dL (ref 8.9–10.3)
Chloride: 104 mmol/L (ref 98–111)
Creatinine, Ser: 0.66 mg/dL (ref 0.61–1.24)
GFR, Estimated: >60 mL/min (ref >60)
Glucose, Bld: 122 mg/dL — ABNORMAL HIGH (ref 70–99)
Potassium: 3.8 mmol/L (ref 3.5–5.1)
Sodium: 135 mmol/L (ref 135–145)
Total Bilirubin: 0.8 mg/dL (ref 0.0–1.2)
Total Protein: 7.5 g/dL (ref 6.5–8.1)

## 2024-02-21 LAB — GLUCOSE, CAPILLARY
Glucose-Capillary: 120 mg/dL — ABNORMAL HIGH (ref 70–99)
Glucose-Capillary: 125 mg/dL — ABNORMAL HIGH (ref 70–99)
Glucose-Capillary: 128 mg/dL — ABNORMAL HIGH (ref 70–99)
Glucose-Capillary: 132 mg/dL — ABNORMAL HIGH (ref 70–99)
Glucose-Capillary: 140 mg/dL — ABNORMAL HIGH (ref 70–99)
Glucose-Capillary: 144 mg/dL — ABNORMAL HIGH (ref 70–99)

## 2024-02-21 LAB — MAGNESIUM: Magnesium: 2.1 mg/dL (ref 1.7–2.4)

## 2024-02-21 MED ORDER — PANTOPRAZOLE SODIUM 40 MG IV SOLR
40.0000 mg | INTRAVENOUS | Status: DC
  Administered 2024-02-21 – 2024-02-22 (×2): 40 mg via INTRAVENOUS
  Filled 2024-02-21: qty 10

## 2024-02-21 MED ORDER — ENOXAPARIN SODIUM 40 MG/0.4ML IJ SOSY
40.0000 mg | PREFILLED_SYRINGE | INTRAMUSCULAR | Status: DC
  Administered 2024-02-21 – 2024-02-23 (×3): 40 mg via SUBCUTANEOUS
  Filled 2024-02-21 (×3): qty 0.4

## 2024-02-21 MED ORDER — GUAIFENESIN 100 MG/5ML PO LIQD
15.0000 mL | Freq: Four times a day (QID) | ORAL | Status: DC
  Administered 2024-02-21 – 2024-02-23 (×3): 15 mL via ORAL
  Filled 2024-02-21 (×5): qty 15

## 2024-02-21 MED ORDER — GUAIFENESIN ER 600 MG PO TB12: 600.0000 mg | ORAL_TABLET | Freq: Two times a day (BID) | ORAL | Status: DC

## 2024-02-21 NOTE — Progress Notes (Signed)
    Providing Compassionate, Quality Care - Together   NEUROSURGERY PROGRESS NOTE     S: No issues overnight.    O: EXAM:  BP 96/86 (BP Location: Right Arm)   Pulse 89   Temp 98.1 F (36.7 C) (Oral)   Resp 15   Ht 5\' 6"  (1.676 m)   Wt 85.5 kg   SpO2 98%   BMI 30.42 kg/m     Awake, alert, oriented  Speech fluent, appropriate  CNs grossly intact  BUE/BLE 5/5 Dressing c/d/i   ASSESSMENT:  63 y.o. with R SDH s/p burr holes     PLAN: -Ok Lovenox  dvt ppx today -Continue supportive care -Continue therapies as tolerated -Will need f/u CT in 2 weeks w/ outpt follow up.  -Call w/ questions/concerns.   [name], [facility]

## 2024-02-21 NOTE — TOC Progression Note (Signed)
 Transition of Care [facility]) - Progression Note    Patient Details  Name: [name] MRN: [id number] Date of Birth: 1961-04-17  Transition [facility] Care [facility]) CM/SW Contact  Tandy Fam, [place] Phone Number: 02/21/2024, 2:29 PM  Clinical Narrative:   CSW following for discharge to SNF. Per MD, patient possibly ready early next week. CSW requested [facility] to initiate new authorization request. CSW to follow.    Expected Discharge Plan: Skilled Nursing Facility Barriers to Discharge: Continued Medical Work up  Expected Discharge Plan and Services In-house Referral: Clinical Social Work   Post Acute Care Choice: Skilled Nursing Facility Living arrangements for the past 2 months: Single Family Home                                       Social Determinants of Health (SDOH) Interventions SDOH Screenings   Food Insecurity: Patient Unable To Answer (02/02/2024)  Housing: Patient Unable To Answer (02/02/2024)  Transportation Needs: Patient Unable To Answer (02/02/2024)  Utilities: Patient Unable To Answer (02/02/2024)  Financial Resource Strain: Low Risk  (12/31/2022)   Received from [facility]  Physical Activity: Unknown (03/27/2023)   Received from [facility]  Social Connections: Somewhat Isolated (03/27/2023)   Received from [facility]  Stress: Stress Concern Present (03/27/2023)   Received from [facility]  Tobacco Use: High Risk (02/18/2024)    Readmission Risk Interventions     No data to display

## 2024-02-21 NOTE — Progress Notes (Signed)
 Physical Therapy Treatment Patient Details Name: [name] MRN: [id number] DOB: 1961-06-30 Today's Date: 02/21/2024   History of Present Illness Pt is a 63 y.o. male presenting 5/8 after a fall at home. UDS positive for benzodiazepines. EKG with sinus bradycardia. Head CT 5/8 showed 9 mm right subdural hematoma with 3 mm midline shift and small volume acute SAH. Pt on continuous EEG 5/14 with continued seizures. 5/13 CT head: Resolved left SDH, increased right SDH-11 mm. Small volume SAH. 5/27 pt with declining mental status and repeat head CT with progressed and now mixed density right SDH 14mm thickness with incr intracranial mass effect and L midline shift 9-101mm. S/p R sided burr holes and placement of subdural train for hamatoma evacuation 5/27. PMH significant for seizures and COPD, smoker.    PT Comments  Slow progress towards goals. Currently pt is Mod A for bed mobility supine to sitting and 2 person Mod A for sitting to supine. Pt requires assist from mod A to CGA for balance sitting EOB for ~ 5 min during session and was able to assist scooting EOB at 2 person Mod A with facilitation at the LE and trunk. Due to pt current functional status, home set up and available assistance at home recommending skilled physical therapy services < 3 hours/day in order to address strength, balance and functional mobility to decrease risk for falls, injury, immobility, skin break down and re-hospitalization.      If plan is discharge home, recommend the following: Two people to help with walking and/or transfers;Assist for transportation;Assistance with cooking/housework;Help with stairs or ramp for entrance;Supervision due to cognitive status   Can travel by private vehicle     No  Equipment Recommendations  Wheelchair (measurements PT);Wheelchair cushion (measurements PT);Hoyer lift       Precautions / Restrictions Precautions Precautions: Fall Recall of Precautions/Restrictions:  Impaired Precaution/Restrictions Comments: foley Restrictions Weight Bearing Restrictions Per Provider Order: No     Mobility  Bed Mobility Overal bed mobility: Needs Assistance Bed Mobility: Supine to Sit, Sit to Supine Rolling: Mod assist, Used rails   Supine to sit: Mod assist Sit to supine: Mod assist, +2 for physical assistance, +2 for safety/equipment   General bed mobility comments: multimodal cues throughout with assist for roll, bringing BLE off EOB, and truncal elevation. Pt assisted with scooting up EOB with cues for initiation at the posterior R thigh and verbal in order to sequence movement.    Transfers     General transfer comment: deferred today due to pain    Ambulation/Gait     General Gait Details: deferred today due to pain    Modified Rankin (Stroke Patients Only) Modified Rankin (Stroke Patients Only) Pre-Morbid Rankin Score: Moderate disability Modified Rankin: Severe disability     Balance Overall balance assessment: Needs assistance Sitting-balance support: Feet supported, Single extremity supported Sitting balance-Leahy Scale: Fair Sitting balance - Comments: mod A progressing to short bouts of CGA statically Postural control: Posterior lean, Left lateral lean     Standing balance comment: deferred due to headache          Communication Communication Communication: Impaired  Cognition Arousal: Alert Behavior During Therapy: Flat affect   PT - Cognitive impairments: Difficult to assess Difficult to assess due to: Impaired communication   Following commands: Impaired Following commands impaired: Follows one step commands with increased time    Cueing Cueing Techniques: Verbal cues, Tactile cues, Gestural cues     General Comments General comments (skin integrity, edema, etc.): Spouse  present throughout session. Vital signs stable.      Pertinent Vitals/Pain Pain Assessment Pain Assessment: Faces Faces Pain Scale: Hurts  even more Breathing: normal Negative Vocalization: occasional moan/groan, low speech, negative/disapproving quality Facial Expression: facial grimacing Body Language: relaxed Consolability: distracted or reassured by voice/touch PAINAD Score: 4 Pain Location: headache Pain Descriptors / Indicators: Headache Pain Intervention(s): Monitored during session, Premedicated before session, Limited activity within patient's tolerance     PT Goals (current goals can now be found in the care plan section) Acute Rehab PT Goals Patient Stated Goal: to improve pain and mobility PT Goal Formulation: With patient/family Time For Goal Achievement: 03/04/24 Potential to Achieve Goals: Fair Progress towards PT goals: Progressing toward goals    Frequency    Min 2X/week      PT Plan  Continue with current POC        AM-PAC PT "6 Clicks" Mobility   Outcome Measure  Help needed turning from your back to your side while in a flat bed without using bedrails?: A Lot Help needed moving from lying on your back to sitting on the side of a flat bed without using bedrails?: A Lot Help needed moving to and from a bed to a chair (including a wheelchair)?: Total Help needed standing up from a chair using your arms (e.g., wheelchair or bedside chair)?: Total Help needed to walk in hospital room?: Total Help needed climbing 3-5 steps with a railing? : Total 6 Click Score: 8    End of Session   Activity Tolerance: Patient tolerated treatment well Patient left: in bed;with call bell/phone within reach;with family/visitor present;with bed alarm set;with SCD's reapplied Nurse Communication: Mobility status PT Visit Diagnosis: Unsteadiness on feet (R26.81);Muscle weakness (generalized) (M62.81);History of falling (Z91.81);Difficulty in walking, not elsewhere classified (R26.2);Other symptoms and signs involving the nervous system (R29.898)     Time: 9562-1308 PT Time Calculation (min) (ACUTE ONLY): 11  min  Charges:    $Therapeutic Activity: 8-22 mins PT General Charges $$ ACUTE PT VISIT: 1 Visit                    Sloan Duncans, DPT, CLT  Acute Rehabilitation Services Office: [phone] (Secure chat preferred)    Jenice Mitts 02/21/2024, 4:50 PM

## 2024-02-21 NOTE — Progress Notes (Signed)
 PROGRESS NOTE    [name]  ZOX:[id number] DOB: July 22, 1961 DOA: 01/30/2024 PCP: Claudell Cruz, MD   Brief Narrative: 63 year old who presents by EMS from home after a fall.  On admission patient was lethargic unable to provide history.  He was found to be bradycardic heart rate 40s to 80s, hypothermic, multiple bruises, of multiple ages left hip, left thigh, left shoulder.  UDS positive for benzos.  CT showed 9 mm right subdural hematoma with 3 mm midline shift and a small volume acute SAH, cervical spine was negative.  CCM admitted patient.  Patient was found to have subdural hematoma and seizure.  His care was subsequently transferred to [facility] on 5/12.  Complicated course by fever of unknown origin ultimately found to have right upper extremity DVT probably due to PICC line, fever resolved after PICC line was removed.  He was not a candidate for anticoagulation due to moderate-sized SDH.  Neurosurgery recommendation repeated CT head was done on 5/27 which showed further worsening of SDH subsequently reevaluated by neurosurgery and underwent burr hole and placement of subdural drain for evacuation of subdural hematoma by Dr. [name] on 5/27.  Postprocedure care was resumed by ICU team.  Patient care transferred again to [facility] on 5/30     Assessment & Plan:   Principal Problem:   Subdural hematoma (HCC)   Traumatic SDH/SAH: Seizure.  -Patient presented lethargic, CT head showed 9 mm right subdural hematoma.  -Fail to improved with conservative measure.  -Underwent burr hole and placement of subdural drain for evacuation of subdural hematoma by Dr. [name] on 5/27. -still having significant headaches, post procedure per family. And not better. Neurosurgery order CT head.  -pain management, needs rehab.  Continue Phenobarbital  and Vimpat .  EVD removed 5/29.  Acute toxic metabolic encephalopathy: In setting of SDH, Seizure.   HTN:  Started On metoprolol .  IV Hydralazine  PRN.    Seizure ; - Likely provoked from SDH - LTM EEG negative - On phenobarbital  and Vimpat   Complicated  E. coli UTI: -Completed 5 days Rocephin    Right upper extremity DVT: - Related to PICC line removed 5/23 -Unable to get full dose anticoagulation due to SDH Discussed with Dr [name], CT looks stable, ok to start Lovenox  40 daily.    Severe hypothyroidism, possible myxedema secondary to noncompliance: Received IV synthroid  Continue oral Synthroid    Pre-diabetes: SSI  COPD: Will add Guaifenesin .  Encourage compliance to nebulizer.   Tobacco abuse: Counseling.  Nicotine  Patch.    Pressure Injury 01/31/24 Coccyx Mid Deep Tissue Pressure Injury - Purple or maroon localized area of discolored intact skin or blood-filled blister due to damage of underlying soft tissue from pressure and/or shear. (Active)  01/31/24 0100  Location: Coccyx  Location Orientation: Mid  Staging: Deep Tissue Pressure Injury - Purple or maroon localized area of discolored intact skin or blood-filled blister due to damage of underlying soft tissue from pressure and/or shear.  Wound Description (Comments):   Present on Admission: Yes  Dressing Type Foam - Lift dressing to assess site every shift 02/20/24 2100     Nutrition Problem: Increased nutrient needs Etiology: chronic illness (COPD)    Signs/Symptoms: estimated needs    Interventions: Ensure Enlive (each supplement provides 350kcal and 20 grams of protein), MVI  Estimated body mass index is 30.42 kg/m as calculated from the following:   Height as of this encounter: 5\' 6"  (1.676 m).   Weight as of this encounter: 85.5 kg.   DVT prophylaxis: Lovenox  5/30  Code Status: Full code Family Communication:Wife at bedside.  Disposition Plan:  Status is: Inpatient Remains inpatient appropriate because: management of SDH    Consultants:  Dr [name]  Procedures:  Janae Mclean  Antimicrobials:    Subjective: He keeps eyes close, answer  questions follows command. Moves extrmities. Complaining of persistent headache.   Objective: Vitals:   02/21/24 0053 02/21/24 0427 02/21/24 0427 02/21/24 0736  BP: 128/61 (!) 127/59 (!) 127/59 96/86  Pulse: 79 88 88 89  Resp: 20 20  16   Temp: 98.5 F (36.9 C) 97.6 F (36.4 C) 97.6 F (36.4 C) 98.1 F (36.7 C)  TempSrc: Oral Oral Oral Oral  SpO2: 95% 98% 98% 98%  Weight:      Height:        Intake/Output Summary (Last 24 hours) at 02/21/2024 0812 Last data filed at 02/20/2024 1100 Gross per 24 hour  Intake --  Output 250 ml  Net -250 ml   Filed Weights   02/18/24 1230 02/19/24 0500 02/20/24 0500  Weight: 82.2 kg 85.8 kg 85.5 kg    Examination:  General exam: Appears calm and comfortable  Respiratory system: Clear to auscultation. Respiratory effort normal. Cardiovascular system: S1 & S2 heard, RRR. No JVD, murmurs, rubs, gallops or clicks. No pedal edema. Gastrointestinal system: Abdomen is nondistended, soft and nontender. No organomegaly or masses felt. Normal bowel sounds heard. Central nervous system: Alert and oriented. No focal neurological deficits. Extremities: Symmetric 5 x 5 power. Skin: No rashes, lesions or ulcers Psychiatry: Judgement and insight appear normal. Mood & affect appropriate.     Data Reviewed: I have personally reviewed following labs and imaging studies  CBC: Recent Labs  Lab 02/17/24 0419  WBC 6.0  HGB 9.3*  HCT 27.8*  MCV 96.5  PLT 303   Basic Metabolic Panel: No results for input(s): "NA", "K", "CL", "CO2", "GLUCOSE", "BUN", "CREATININE", "CALCIUM ", "MG", "PHOS" in the last 168 hours. GFR: Estimated Creatinine Clearance: 94.4 mL/min (A) (by C-G formula based on SCr of 0.6 mg/dL (L)). Liver Function Tests: No results for input(s): "AST", "ALT", "ALKPHOS", "BILITOT", "PROT", "ALBUMIN" in the last 168 hours. No results for input(s): "LIPASE", "AMYLASE" in the last 168 hours. No results for input(s): "AMMONIA" in the last 168  hours. Coagulation Profile: No results for input(s): "INR", "PROTIME" in the last 168 hours. Cardiac Enzymes: No results for input(s): "CKTOTAL", "CKMB", "CKMBINDEX", "TROPONINI" in the last 168 hours. BNP (last 3 results) No results for input(s): "PROBNP" in the last 8760 hours. HbA1C: No results for input(s): "HGBA1C" in the last 72 hours. CBG: Recent Labs  Lab 02/20/24 1627 02/20/24 2050 02/21/24 0031 02/21/24 0429 02/21/24 0740  GLUCAP 122* 132* 132* 140* 144*   Lipid Profile: No results for input(s): "CHOL", "HDL", "LDLCALC", "TRIG", "CHOLHDL", "LDLDIRECT" in the last 72 hours. Thyroid  Function Tests: No results for input(s): "TSH", "T4TOTAL", "FREET4", "T3FREE", "THYROIDAB" in the last 72 hours. Anemia Panel: No results for input(s): "VITAMINB12", "FOLATE", "FERRITIN", "TIBC", "IRON", "RETICCTPCT" in the last 72 hours. Sepsis Labs: No results for input(s): "PROCALCITON", "LATICACIDVEN" in the last 168 hours.  Recent Results (from the past 240 hours)  Culture, blood (Routine X 2) w Reflex to ID Panel     Status: None   Collection Time: 02/12/24 11:16 AM   Specimen: BLOOD LEFT HAND  Result Value Ref Range Status   Specimen Description BLOOD LEFT HAND  Final   Special Requests   Final    BOTTLES DRAWN AEROBIC AND ANAEROBIC Blood Culture results may  not be optimal due to an inadequate volume of blood received in culture bottles   Culture   Final    NO GROWTH 5 DAYS Performed at [facility], [address] [place], [place] [address]    Report Status 02/17/2024 FINAL  Final  Culture, blood (Routine X 2) w Reflex to ID Panel     Status: None   Collection Time: 02/12/24 11:18 AM   Specimen: BLOOD LEFT HAND  Result Value Ref Range Status   Specimen Description BLOOD LEFT HAND  Final   Special Requests   Final    BOTTLES DRAWN AEROBIC AND ANAEROBIC Blood Culture adequate volume   Culture   Final    NO GROWTH 5 DAYS Performed at [facility], [address] [place], [place] [address]    Report Status 02/17/2024 FINAL  Final  Respiratory (~20 pathogens) panel by PCR     Status: None   Collection Time: 02/14/24  2:32 PM   Specimen: Nasopharyngeal Swab; Respiratory  Result Value Ref Range Status   Adenovirus NOT DETECTED NOT DETECTED Final   Coronavirus 229E NOT DETECTED NOT DETECTED Final    Comment: (NOTE) The Coronavirus on the Respiratory Panel, DOES NOT test for the novel  Coronavirus (2019 nCoV)    Coronavirus HKU1 NOT DETECTED NOT DETECTED Final   Coronavirus NL63 NOT DETECTED NOT DETECTED Final   Coronavirus OC43 NOT DETECTED NOT DETECTED Final   Metapneumovirus NOT DETECTED NOT DETECTED Final   Rhinovirus / Enterovirus NOT DETECTED NOT DETECTED Final   Influenza A NOT DETECTED NOT DETECTED Final   Influenza B NOT DETECTED NOT DETECTED Final   Parainfluenza Virus 1 NOT DETECTED NOT DETECTED Final   Parainfluenza Virus 2 NOT DETECTED NOT DETECTED Final   Parainfluenza Virus 3 NOT DETECTED NOT DETECTED Final   Parainfluenza Virus 4 NOT DETECTED NOT DETECTED Final   Respiratory Syncytial Virus NOT DETECTED NOT DETECTED Final   Bordetella pertussis NOT DETECTED NOT DETECTED Final   Bordetella Parapertussis NOT DETECTED NOT DETECTED Final   Chlamydophila pneumoniae NOT DETECTED NOT DETECTED Final   Mycoplasma pneumoniae NOT DETECTED NOT DETECTED Final    Comment: Performed at [facility], [address], [place], [place] [address]         Radiology Studies: No results found.      Scheduled Meds:  (feeding supplement) PROSource Plus  30 mL Oral BID BM   ALPRAZolam   0.5 mg Oral BID   arformoterol   15 mcg Nebulization BID   budesonide  (PULMICORT ) nebulizer solution  0.25 mg Nebulization BID   Chlorhexidine  Gluconate Cloth  6 each Topical Daily   feeding supplement  237 mL Oral BID BM   insulin  aspart  0-9 Units Subcutaneous TID AC & HS   lacosamide   200 mg Oral BID   levothyroxine   150 mcg Oral Q0600    metoprolol  tartrate  25 mg Oral BID   multivitamin with minerals  1 tablet Oral Daily   nicotine   21 mg Transdermal Daily   mouth rinse  15 mL Mouth Rinse 4 times per day   pantoprazole  (PROTONIX ) IV  40 mg Intravenous Q24H   phenobarbital   64.8 mg Oral BID   revefenacin   175 mcg Nebulization Daily   sodium chloride  flush  10-40 mL Intracatheter Q12H   sodium chloride   1 g Oral BID   Continuous Infusions:  niCARDipine Stopped (02/18/24 1900)     LOS: 22 days    Time spent:  35 minutes    Danette Duos, MD [facility]   If 7PM-7AM, please contact night-coverage [email or web address]  02/21/2024, 8:12 AM

## 2024-02-21 NOTE — Plan of Care (Signed)
  Problem: Coping: Goal: Ability to adjust to condition or change in health will improve Outcome: Progressing   Problem: Fluid Volume: Goal: Ability to maintain a balanced intake and output will improve Outcome: Progressing   Problem: Health Behavior/Discharge Planning: Goal: Ability to identify and utilize available resources and services will improve Outcome: Progressing Goal: Ability to manage health-related needs will improve Outcome: Progressing   Problem: Metabolic: Goal: Ability to maintain appropriate glucose levels will improve Outcome: Progressing   Problem: Nutritional: Goal: Maintenance of adequate nutrition will improve Outcome: Progressing Goal: Progress toward achieving an optimal weight will improve Outcome: Progressing   Problem: Skin Integrity: Goal: Risk for impaired skin integrity will decrease Outcome: Progressing   Problem: Tissue Perfusion: Goal: Adequacy of tissue perfusion will improve Outcome: Progressing   Problem: Education: Goal: Knowledge of General Education information will improve Description: Including pain rating scale, medication(s)/side effects and non-pharmacologic comfort measures Outcome: Not Progressing   Problem: Health Behavior/Discharge Planning: Goal: Ability to manage health-related needs will improve Outcome: Progressing   Problem: Clinical Measurements: Goal: Ability to maintain clinical measurements within normal limits will improve Outcome: Progressing Goal: Will remain free from infection Outcome: Progressing Goal: Diagnostic test results will improve Outcome: Progressing Goal: Respiratory complications will improve Outcome: Progressing Goal: Cardiovascular complication will be avoided Outcome: Progressing   Problem: Activity: Goal: Risk for activity intolerance will decrease Outcome: Progressing   Problem: Nutrition: Goal: Adequate nutrition will be maintained Outcome: Progressing   Problem:  Elimination: Goal: Will not experience complications related to bowel motility Outcome: Progressing Goal: Will not experience complications related to urinary retention Outcome: Progressing   Problem: Pain Managment: Goal: General experience of comfort will improve and/or be controlled Outcome: Progressing   Problem: Skin Integrity: Goal: Risk for impaired skin integrity will decrease Outcome: Progressing   Problem: Safety: Goal: Non-violent Restraint(s) Outcome: Progressing   Problem: Education: Goal: Knowledge of the prescribed therapeutic regimen will improve Outcome: Progressing   Problem: Clinical Measurements: Goal: Usual level of consciousness will be regained or maintained. Outcome: Progressing Goal: Neurologic status will improve Outcome: Progressing Goal: Ability to maintain intracranial pressure will improve Outcome: Progressing   Problem: Skin Integrity: Goal: Demonstration of wound healing without infection will improve Outcome: Progressing

## 2024-02-22 DIAGNOSIS — S065XAA Traumatic subdural hemorrhage with loss of consciousness status unknown, initial encounter: Secondary | ICD-10-CM | POA: Diagnosis not present

## 2024-02-22 LAB — BASIC METABOLIC PANEL WITH GFR
Anion gap: 11 (ref 5–15)
BUN: 23 mg/dL (ref 8–23)
CO2: 20 mmol/L — ABNORMAL LOW (ref 22–32)
Calcium: 9 mg/dL (ref 8.9–10.3)
Chloride: 105 mmol/L (ref 98–111)
Creatinine, Ser: 0.55 mg/dL — ABNORMAL LOW (ref 0.61–1.24)
GFR, Estimated: >60 mL/min (ref >60)
Glucose, Bld: 138 mg/dL — ABNORMAL HIGH (ref 70–99)
Potassium: 3.7 mmol/L (ref 3.5–5.1)
Sodium: 136 mmol/L (ref 135–145)

## 2024-02-22 LAB — CBC
HCT: 29.4 % — ABNORMAL LOW (ref 39.0–52.0)
Hemoglobin: 9.6 g/dL — ABNORMAL LOW (ref 13.0–17.0)
MCH: 32.7 pg (ref 26.0–34.0)
MCHC: 32.7 g/dL (ref 30.0–36.0)
MCV: 100 fL (ref 80.0–100.0)
Platelets: 298 10*3/uL (ref 150–400)
RBC: 2.94 MIL/uL — ABNORMAL LOW (ref 4.22–5.81)
RDW: 18.4 % — ABNORMAL HIGH (ref 11.5–15.5)
WBC: 5.7 10*3/uL (ref 4.0–10.5)
nRBC: 0 % (ref 0.0–0.2)

## 2024-02-22 LAB — GLUCOSE, CAPILLARY
Glucose-Capillary: 136 mg/dL — ABNORMAL HIGH (ref 70–99)
Glucose-Capillary: 142 mg/dL — ABNORMAL HIGH (ref 70–99)
Glucose-Capillary: 150 mg/dL — ABNORMAL HIGH (ref 70–99)
Glucose-Capillary: 140 mg/dL — ABNORMAL HIGH (ref 70–99)

## 2024-02-22 MED ORDER — CITALOPRAM HYDROBROMIDE 10 MG PO TABS
20.0000 mg | ORAL_TABLET | Freq: Every day | ORAL | Status: DC
  Administered 2024-02-22 – 2024-02-24 (×3): 20 mg via ORAL
  Filled 2024-02-22 (×3): qty 2

## 2024-02-22 MED ORDER — PANTOPRAZOLE SODIUM 40 MG PO TBEC
40.0000 mg | DELAYED_RELEASE_TABLET | Freq: Every day | ORAL | Status: DC
  Administered 2024-02-23 – 2024-02-24 (×2): 40 mg via ORAL
  Filled 2024-02-22 (×2): qty 1

## 2024-02-22 MED ORDER — METOCLOPRAMIDE HCL 5 MG/ML IJ SOLN
10.0000 mg | Freq: Once | INTRAMUSCULAR | Status: AC
  Administered 2024-02-22: 10 mg via INTRAVENOUS
  Filled 2024-02-22: qty 2

## 2024-02-22 MED ORDER — ACETAMINOPHEN 500 MG PO TABS
500.0000 mg | ORAL_TABLET | Freq: Three times a day (TID) | ORAL | Status: DC
  Administered 2024-02-22 – 2024-02-24 (×6): 500 mg via ORAL
  Filled 2024-02-22 (×7): qty 1

## 2024-02-22 NOTE — Progress Notes (Signed)
 Speech Language Pathology Treatment: Dysphagia  Patient Details Name: [name] MRN: [id number] DOB: 04-Dec-1960 Today's Date: 02/22/2024 Time: 7829-5621 SLP Time Calculation (min) (ACUTE ONLY): 21 min  Assessment / Plan / Recommendation Clinical Impression  Pt seen for PO trials at the end of lunch. The pt had consumed the majority of his meal but given verbal encouragement from the SLP and his wife, was agreeable to trials of dys 3 solids. The pt was awake and alert and oriented to himself and temporal concepts. He had x3 bites of dys 3 solids with feeding assist with great attention to the bolus, adequate mastication and bolus manipulation and a timely swallow. No oral residue or s/s of aspiration were observed. The pt reports frustration with current food options and a desire to expand his meal options and a desire to trial mixed consistency foods, specifically casear salad. SLP to f/u with PO trials of upgraded diet textures.   HPI HPI: Pt is a 63 y.o. male who was brought to ED by EMS from home after a fall. CT head (01/30/23) revealed "Slightly increased size of right larger than left subdural hematomas. Small volume subarachnoid hemorrhage with mildly increased hemorrhage in the left lateral ventricle. Failed yale 02/01/24. S/P OR 5/27 for burr holes. PMH: Seizure disorder, COPD; ST f/u for po readiness in acute setting; pt currently has TF for nutritive/hydration purposes.      SLP Plan  Continue with current plan of care      Recommendations for follow up therapy are one component of a multi-disciplinary discharge planning process, led by the attending physician.  Recommendations may be updated based on patient status, additional functional criteria and insurance authorization.    Recommendations  Diet recommendations: Dysphagia 3 (mechanical soft);Thin liquid Medication Administration: Whole meds with puree Supervision: Full supervision/cueing for compensatory strategies;Trained  caregiver to feed patient Compensations: Slow rate;Small sips/bites;Minimize environmental distractions Postural Changes and/or Swallow Maneuvers: Seated upright 90 degrees                  Oral care BID;Staff/trained caregiver to provide oral care   Frequent or constant Supervision/Assistance Dysphagia, unspecified (R13.10);Cognitive communication deficit (R41.841)     Continue with current plan of care     [name] M.S. CCC-SLP

## 2024-02-22 NOTE — Progress Notes (Signed)
 PROGRESS NOTE    [name]  ZOX:[id number] DOB: [birth year] DOA: 01/30/2024 PCP: Claudell Cruz, MD   Brief Narrative: 63 year old who presents by EMS from home after a fall.  On admission patient was lethargic unable to provide history.  He was found to be bradycardic heart rate 40s to 80s, hypothermic, multiple bruises, of multiple ages left hip, left thigh, left shoulder.  UDS positive for benzos.  CT showed 9 mm right subdural hematoma with 3 mm midline shift and a small volume acute SAH, cervical spine was negative.  [facility] admitted patient.  Patient was found to have subdural hematoma and seizure.  His care was subsequently transferred to [facility] on 5/12.  Complicated course by fever of unknown origin ultimately found to have right upper extremity DVT probably due to PICC line, fever resolved after PICC line was removed.  He was not a candidate for anticoagulation due to moderate-sized SDH.  Neurosurgery recommendation repeated CT head was done on 5/27 which showed further worsening of SDH subsequently reevaluated by neurosurgery and underwent burr hole and placement of subdural drain for evacuation of subdural hematoma by Dr. [name] on 5/27.  Postprocedure care was resumed by ICU team.  Patient care transferred again to [facility] on 5/30     Assessment & Plan:   Principal Problem:   Subdural hematoma (HCC)   Traumatic SDH/SAH: Seizure.  -Patient presented lethargic, CT head showed 9 mm right subdural hematoma.  -Fail to improved with conservative measure.  -Underwent burr hole and placement of subdural drain for evacuation of subdural hematoma by Dr. [name] on 5/27. -still having significant headaches, post procedure per family. And not better. Neurosurgery order CT head.  -pain management, needs rehab.  Continue Phenobarbital  and Vimpat .  EVD removed 5/29. Repeated CT head 5/30; reviewed by neurosurgery stable. Ok to start low dose Lovenox  for prophylasix.  Continue to have headaches,  discussed with neurology could try IV reglan. Pain likely post Sx.   Acute toxic metabolic encephalopathy: In setting of SDH, Seizure.   HTN:  Started On metoprolol .  IV Hydralazine  PRN.   Seizure ; - Likely provoked from SDH - LTM EEG negative - On phenobarbital  and Vimpat   Complicated  E. coli UTI: -Completed 5 days Rocephin    Right upper extremity DVT: - Related to PICC line removed 5/23 -Unable to get full dose anticoagulation due to SDH Discussed with Dr [name], CT looks stable, ok to start Lovenox  40 daily.    Severe hypothyroidism, possible myxedema secondary to noncompliance: Received IV synthroid  Continue oral Synthroid    Pre-diabetes: SSI  COPD: Will add Guaifenesin .  Encourage compliance to nebulizer.   Tobacco abuse: Counseling.  Nicotine  Patch.    Pressure Injury 01/31/24 Coccyx Mid Deep Tissue Pressure Injury - Purple or maroon localized area of discolored intact skin or blood-filled blister due to damage of underlying soft tissue from pressure and/or shear. (Active)  01/31/24 0100  Location: Coccyx  Location Orientation: Mid  Staging: Deep Tissue Pressure Injury - Purple or maroon localized area of discolored intact skin or blood-filled blister due to damage of underlying soft tissue from pressure and/or shear.  Wound Description (Comments):   Present on Admission: Yes  Dressing Type Foam - Lift dressing to assess site every shift 02/22/24 8119     Nutrition Problem: Increased nutrient needs Etiology: chronic illness (COPD)    Signs/Symptoms: estimated needs    Interventions: Ensure Enlive (each supplement provides 350kcal and 20 grams of protein), MVI  Estimated body mass index is  29.53 kg/m as calculated from the following:   Height as of this encounter: 5\' 6"  (1.676 m).   Weight as of this encounter: 83 kg.   DVT prophylaxis: Lovenox  5/30 Code Status: Full code Family Communication:Wife at bedside.  Disposition Plan:  Status  is: Inpatient Remains inpatient appropriate because: management of SDH. Plan for SNF, stable.     Consultants:  Dr [name]  Procedures:  Janae Mclean  Antimicrobials:    Subjective: He is alert, he is still complaining of headaches, not better.  Per wife he had headaches even prior to this hospitalization. She think he is frustrated because he wants to go home.    Objective: Vitals:   02/22/24 0325 02/22/24 0500 02/22/24 0914 02/22/24 1231  BP: 132/69  133/72 126/70  Pulse: 82     Resp: 17  18 18   Temp: 97.6 F (36.4 C)  98.3 F (36.8 C) 98.3 F (36.8 C)  TempSrc: Oral  Oral Oral  SpO2: 96%  95% 96%  Weight:  83 kg    Height:        Intake/Output Summary (Last 24 hours) at 02/22/2024 1537 Last data filed at 02/22/2024 0272 Gross per 24 hour  Intake 10 ml  Output 500 ml  Net -490 ml   Filed Weights   02/19/24 0500 02/20/24 0500 02/22/24 0500  Weight: 85.8 kg 85.5 kg 83 kg    Examination:  General exam: NAD Respiratory system: CTA Cardiovascular system: S 1, S 2 RRR Gastrointestinal system: BS present, soft, nt Central nervous system: alert, follows command. Moves all 4 extremities.  Extremities: no edema   Data Reviewed: I have personally reviewed following labs and imaging studies  CBC: Recent Labs  Lab 02/17/24 0419 02/21/24 1120 02/22/24 1033  WBC 6.0 5.8 5.7  HGB 9.3* 9.3* 9.6*  HCT 27.8* 27.9* 29.4*  MCV 96.5 97.9 100.0  PLT 303 282 298   Basic Metabolic Panel: Recent Labs  Lab 02/21/24 1120 02/22/24 1033  NA 135 136  K 3.8 3.7  CL 104 105  CO2 26 20*  GLUCOSE 122* 138*  BUN 21 23  CREATININE 0.66 0.55*  CALCIUM  8.9 9.0  MG 2.1  --    GFR: Estimated Creatinine Clearance: 93.1 mL/min (A) (by C-G formula based on SCr of 0.55 mg/dL (L)). Liver Function Tests: Recent Labs  Lab 02/21/24 1120  AST 28  ALT 43  ALKPHOS 93  BILITOT 0.8  PROT 7.5  ALBUMIN 3.3*   No results for input(s): "LIPASE", "AMYLASE" in the last 168 hours. No  results for input(s): "AMMONIA" in the last 168 hours. Coagulation Profile: No results for input(s): "INR", "PROTIME" in the last 168 hours. Cardiac Enzymes: No results for input(s): "CKTOTAL", "CKMB", "CKMBINDEX", "TROPONINI" in the last 168 hours. BNP (last 3 results) No results for input(s): "PROBNP" in the last 8760 hours. HbA1C: No results for input(s): "HGBA1C" in the last 72 hours. CBG: Recent Labs  Lab 02/21/24 1125 02/21/24 1631 02/21/24 2114 02/22/24 0615 02/22/24 1127  GLUCAP 128* 120* 125* 136* 142*   Lipid Profile: No results for input(s): "CHOL", "HDL", "LDLCALC", "TRIG", "CHOLHDL", "LDLDIRECT" in the last 72 hours. Thyroid  Function Tests: No results for input(s): "TSH", "T4TOTAL", "FREET4", "T3FREE", "THYROIDAB" in the last 72 hours. Anemia Panel: No results for input(s): "VITAMINB12", "FOLATE", "FERRITIN", "TIBC", "IRON", "RETICCTPCT" in the last 72 hours. Sepsis Labs: No results for input(s): "PROCALCITON", "LATICACIDVEN" in the last 168 hours.  Recent Results (from the past 240 hours)  Respiratory (~20 pathogens) panel  by PCR     Status: None   Collection Time: 02/14/24  2:32 PM   Specimen: Nasopharyngeal Swab; Respiratory  Result Value Ref Range Status   Adenovirus NOT DETECTED NOT DETECTED Final   Coronavirus 229E NOT DETECTED NOT DETECTED Final    Comment: (NOTE) The Coronavirus on the Respiratory Panel, DOES NOT test for the novel  Coronavirus (2019 nCoV)    Coronavirus HKU1 NOT DETECTED NOT DETECTED Final   Coronavirus NL63 NOT DETECTED NOT DETECTED Final   Coronavirus OC43 NOT DETECTED NOT DETECTED Final   Metapneumovirus NOT DETECTED NOT DETECTED Final   Rhinovirus / Enterovirus NOT DETECTED NOT DETECTED Final   Influenza A NOT DETECTED NOT DETECTED Final   Influenza B NOT DETECTED NOT DETECTED Final   Parainfluenza Virus 1 NOT DETECTED NOT DETECTED Final   Parainfluenza Virus 2 NOT DETECTED NOT DETECTED Final   Parainfluenza Virus 3 NOT  DETECTED NOT DETECTED Final   Parainfluenza Virus 4 NOT DETECTED NOT DETECTED Final   Respiratory Syncytial Virus NOT DETECTED NOT DETECTED Final   Bordetella pertussis NOT DETECTED NOT DETECTED Final   Bordetella Parapertussis NOT DETECTED NOT DETECTED Final   Chlamydophila pneumoniae NOT DETECTED NOT DETECTED Final   Mycoplasma pneumoniae NOT DETECTED NOT DETECTED Final    Comment: Performed at [facility], [address], [place], [place] [address]         Radiology Studies: CT HEAD WO CONTRAST ( ) Result Date: 02/21/2024 CLINICAL DATA:  Provided history: Subdural hematoma. EXAM: CT HEAD WITHOUT CONTRAST TECHNIQUE: Contiguous axial images were obtained from the base of the skull through the vertex without intravenous contrast. RADIATION DOSE REDUCTION: This exam was performed according to the departmental dose-optimization program which includes automated exposure control, adjustment of the mA and/or kV according to patient size and/or use of iterative reconstruction technique. COMPARISON:  Prior head CT examinations 02/19/2024 and earlier. FINDINGS: Brain: A residual mixed density subdural hematoma along the right cerebral convexity has not significantly changed in size, again measuring up to 10 mm in thickness (remeasured on prior). Unchanged mass effect with 5 mm leftward midline shift. Persistent although decreased pneumocephalus overlying the right cerebral hemisphere. Small-volume hemorrhage layering within the left lateral ventricle occipital horn has undergone some interval redistribution, but has not changed in extent. Unchanged size and configuration of the ventricular system. No interval acute intracranial hemorrhage. No acute demarcated cortical infarct. No evidence of an intracranial mass. Vascular: No hyperdense vessel.  Atherosclerotic calcifications. Skull: Two right parietal burr holes. Sinuses/Orbits: No orbital mass or acute orbital finding. Mild mucosal thickening  scattered within the paranasal sinuses at the imaged levels. Other: Scalp staples on the right. IMPRESSION: 1. Unchanged size of a residual mixed density subdural hematoma along the right cerebral convexity. Unchanged mass effect with 5 mm leftward midline shift. 2. Persistent although decreased post-operative pneumocephalus along the right cerebral convexity. 3. Small-volume hemorrhage within the left lateral ventricle has undergone some redistribution, but has not changed in extent. 4. No new acute intracranial abnormality. Electronically Signed   By: Bascom Lily D.O.   On: 02/21/2024 13:46        Scheduled Meds:  (feeding supplement) PROSource Plus  30 mL Oral BID BM   acetaminophen   500 mg Oral TID   ALPRAZolam   0.5 mg Oral BID   arformoterol   15 mcg Nebulization BID   budesonide  (PULMICORT ) nebulizer solution  0.25 mg Nebulization BID   citalopram   20 mg Oral Daily   enoxaparin  (LOVENOX ) injection  40 mg Subcutaneous Q24H   feeding supplement  237 mL Oral BID BM   guaiFENesin   15 mL Oral Q6H   insulin  aspart  0-9 Units Subcutaneous TID AC & HS   lacosamide   200 mg Oral BID   levothyroxine   150 mcg Oral Q0600   metoprolol  tartrate  25 mg Oral BID   multivitamin with minerals  1 tablet Oral Daily   nicotine   21 mg Transdermal Daily   mouth rinse  15 mL Mouth Rinse 4 times per day   [START ON 02/23/2024] pantoprazole   40 mg Oral Daily   phenobarbital   64.8 mg Oral BID   revefenacin   175 mcg Nebulization Daily   sodium chloride  flush  10-40 mL Intracatheter Q12H   sodium chloride   1 g Oral BID   Continuous Infusions:     LOS: 23 days    Time spent: 35 minutes    Anaissa Macfadden A Aliyanna Wassmer, MD [facility]   If 7PM-7AM, please contact night-coverage [email or web address]  02/22/2024, 3:37 PM

## 2024-02-22 NOTE — Plan of Care (Signed)
  Problem: Nutritional: Goal: Maintenance of adequate nutrition will improve Outcome: Progressing Goal: Progress toward achieving an optimal weight will improve Outcome: Progressing   Problem: Skin Integrity: Goal: Risk for impaired skin integrity will decrease Outcome: Progressing   Problem: Education: Goal: Knowledge of General Education information will improve Description: Including pain rating scale, medication(s)/side effects and non-pharmacologic comfort measures Outcome: Progressing   Problem: Clinical Measurements: Goal: Ability to maintain clinical measurements within normal limits will improve Outcome: Progressing Goal: Will remain free from infection Outcome: Progressing Goal: Diagnostic test results will improve Outcome: Progressing Goal: Respiratory complications will improve Outcome: Progressing Goal: Cardiovascular complication will be avoided Outcome: Progressing   Problem: Activity: Goal: Risk for activity intolerance will decrease Outcome: Progressing   Problem: Elimination: Goal: Will not experience complications related to bowel motility Outcome: Progressing Goal: Will not experience complications related to urinary retention Outcome: Progressing   Problem: Safety: Goal: Ability to remain free from injury will improve Outcome: Progressing   Problem: Skin Integrity: Goal: Risk for impaired skin integrity will decrease Outcome: Progressing

## 2024-02-23 DIAGNOSIS — S065XAA Traumatic subdural hemorrhage with loss of consciousness status unknown, initial encounter: Secondary | ICD-10-CM | POA: Diagnosis not present

## 2024-02-23 LAB — GLUCOSE, CAPILLARY
Glucose-Capillary: 164 mg/dL — ABNORMAL HIGH (ref 70–99)
Glucose-Capillary: 123 mg/dL — ABNORMAL HIGH (ref 70–99)
Glucose-Capillary: 163 mg/dL — ABNORMAL HIGH (ref 70–99)
Glucose-Capillary: 115 mg/dL — ABNORMAL HIGH (ref 70–99)

## 2024-02-23 LAB — BASIC METABOLIC PANEL WITH GFR
Anion gap: 9 (ref 5–15)
BUN: 19 mg/dL (ref 8–23)
CO2: 24 mmol/L (ref 22–32)
Calcium: 9.2 mg/dL (ref 8.9–10.3)
Chloride: 103 mmol/L (ref 98–111)
Creatinine, Ser: 0.6 mg/dL — ABNORMAL LOW (ref 0.61–1.24)
GFR, Estimated: >60 mL/min (ref >60)
Glucose, Bld: 128 mg/dL — ABNORMAL HIGH (ref 70–99)
Potassium: 3.8 mmol/L (ref 3.5–5.1)
Sodium: 136 mmol/L (ref 135–145)

## 2024-02-23 LAB — CBC
HCT: 30.3 % — ABNORMAL LOW (ref 39.0–52.0)
Hemoglobin: 9.9 g/dL — ABNORMAL LOW (ref 13.0–17.0)
MCH: 32.1 pg (ref 26.0–34.0)
MCHC: 32.7 g/dL (ref 30.0–36.0)
MCV: 98.4 fL (ref 80.0–100.0)
Platelets: 317 10*3/uL (ref 150–400)
RBC: 3.08 MIL/uL — ABNORMAL LOW (ref 4.22–5.81)
RDW: 18.6 % — ABNORMAL HIGH (ref 11.5–15.5)
WBC: 5.8 10*3/uL (ref 4.0–10.5)
nRBC: 0 % (ref 0.0–0.2)

## 2024-02-23 MED ORDER — POLYETHYLENE GLYCOL 3350 17 G PO PACK
17.0000 g | PACK | Freq: Every day | ORAL | Status: DC
  Administered 2024-02-23 – 2024-02-24 (×2): 17 g via ORAL
  Filled 2024-02-23 (×2): qty 1

## 2024-02-23 MED ORDER — OXYCODONE HCL 5 MG PO TABS
5.0000 mg | ORAL_TABLET | ORAL | Status: DC | PRN
  Administered 2024-02-23 – 2024-02-24 (×5): 10 mg via ORAL
  Filled 2024-02-23 (×5): qty 2

## 2024-02-23 MED ORDER — PROCHLORPERAZINE EDISYLATE 10 MG/2ML IJ SOLN
10.0000 mg | Freq: Once | INTRAMUSCULAR | Status: AC
  Administered 2024-02-23: 10 mg via INTRAVENOUS
  Filled 2024-02-23: qty 2

## 2024-02-23 NOTE — Plan of Care (Signed)
  Problem: Nutritional: Goal: Maintenance of adequate nutrition will improve Outcome: Progressing Goal: Progress toward achieving an optimal weight will improve Outcome: Progressing   Problem: Skin Integrity: Goal: Risk for impaired skin integrity will decrease Outcome: Progressing   Problem: Clinical Measurements: Goal: Ability to maintain clinical measurements within normal limits will improve Outcome: Progressing Goal: Will remain free from infection Outcome: Progressing Goal: Diagnostic test results will improve Outcome: Progressing Goal: Respiratory complications will improve Outcome: Progressing Goal: Cardiovascular complication will be avoided Outcome: Progressing   Problem: Pain Managment: Goal: General experience of comfort will improve and/or be controlled Outcome: Progressing   Problem: Safety: Goal: Ability to remain free from injury will improve Outcome: Progressing

## 2024-02-23 NOTE — Progress Notes (Addendum)
 PROGRESS NOTE    [name]  IHK:[id number] DOB: 1961-08-24 DOA: 01/30/2024 PCP: Claudell Cruz, MD   Brief Narrative: 63 year old who presents by EMS from home after a fall.  On admission patient was lethargic unable to provide history.  He was found to be bradycardic heart rate 40s to 80s, hypothermic, multiple bruises, of multiple ages left hip, left thigh, left shoulder.  UDS positive for benzos.  CT showed 9 mm right subdural hematoma with 3 mm midline shift and a small volume acute SAH, cervical spine was negative.  CCM admitted patient.  Patient was found to have subdural hematoma and seizure.  His care was subsequently transferred to [facility] on 5/12.  Complicated course by fever of unknown origin ultimately found to have right upper extremity DVT probably due to PICC line, fever resolved after PICC line was removed.  He was not a candidate for anticoagulation due to moderate-sized SDH.  Neurosurgery recommendation repeated CT head was done on 5/27 which showed further worsening of SDH subsequently reevaluated by neurosurgery and underwent burr hole and placement of subdural drain for evacuation of subdural hematoma by Dr. [name] on 5/27.  Postprocedure care was resumed by ICU team.  Patient care transferred again to [facility] on 5/30     Assessment & Plan:   Principal Problem:   Subdural hematoma (HCC)   Traumatic SDH/SAH: Seizure.  -Patient presented lethargic, CT head showed 9 mm right subdural hematoma.  -Fail to improved with conservative measure.  -Underwent burr hole and placement of subdural drain for evacuation of subdural hematoma by Dr. [name] on 5/27. -still having significant headaches, post procedure per family. And not better. Neurosurgery order CT head.  -pain management, needs rehab.  Continue Phenobarbital  and Vimpat .  EVD removed 5/29. Repeated CT head 5/30; reviewed by neurosurgery stable. Ok to start low dose Lovenox  for prophylasix.  Continue to have headaches,  discussed with neurology could try IV reglan. Pain likely post Sx. Reglan didn't help. Will try compazine,. Will aso increase oxycodone  to 5-10 mg PRN   Acute toxic metabolic encephalopathy: In setting of SDH, Seizure.   HTN:  Started On metoprolol .  IV Hydralazine  PRN.   Seizure ; - Likely provoked from SDH - LTM EEG negative - On phenobarbital  and Vimpat   Complicated  E. coli UTI: -Completed 5 days Rocephin    Right upper extremity DVT: - Related to PICC line removed 5/23 -Unable to get full dose anticoagulation due to SDH Discussed with Dr [name], CT looks stable, ok to start Lovenox  40 daily.    Severe hypothyroidism, possible myxedema secondary to noncompliance: Received IV synthroid  Continue oral Synthroid   Constipation; Start Miralax   Pre-diabetes: SSI  COPD: Will add Guaifenesin .  Encourage compliance to nebulizer.   Tobacco abuse: Counseling.  Nicotine  Patch.    Pressure Injury 01/31/24 Coccyx Mid Deep Tissue Pressure Injury - Purple or maroon localized area of discolored intact skin or blood-filled blister due to damage of underlying soft tissue from pressure and/or shear. (Active)  01/31/24 0100  Location: Coccyx  Location Orientation: Mid  Staging: Deep Tissue Pressure Injury - Purple or maroon localized area of discolored intact skin or blood-filled blister due to damage of underlying soft tissue from pressure and/or shear.  Wound Description (Comments):   Present on Admission: Yes  Dressing Type Foam - Lift dressing to assess site every shift 02/22/24 2000     Nutrition Problem: Increased nutrient needs Etiology: chronic illness (COPD)    Signs/Symptoms: estimated needs    Interventions: Ensure  Enlive (each supplement provides 350kcal and 20 grams of protein), MVI  Estimated body mass index is 29.53 kg/m as calculated from the following:   Height as of this encounter: 5\' 6"  (1.676 m).   Weight as of this encounter: 83 kg.   DVT  prophylaxis: Lovenox  5/30 Code Status: Full code Family Communication:Wife at bedside.  Disposition Plan:  Status is: Inpatient Remains inpatient appropriate because: management of SDH. Plan for SNF, stable.     Consultants:  Dr [name]  Procedures:  Janae Mclean  Antimicrobials:    Subjective: He is still having headaches. When he is ready to rest , staff interrupt his rest. He denies suicidal thought or ideation, he wouldn't hurt himself, he was just frustrated with pain.     Objective: Vitals:   02/23/24 0006 02/23/24 0430 02/23/24 0620 02/23/24 0800  BP: 135/68 (!) 155/68 (!) 143/63 (!) 142/75  Pulse: 65 68 80   Resp:      Temp: 98 F (36.7 C) 98.3 F (36.8 C)  97.7 F (36.5 C)  TempSrc: Oral Oral  Oral  SpO2: 95% 98%  96%  Weight:      Height:        Intake/Output Summary (Last 24 hours) at 02/23/2024 1219 Last data filed at 02/23/2024 4098 Gross per 24 hour  Intake 10 ml  Output 710 ml  Net -700 ml   Filed Weights   02/19/24 0500 02/20/24 0500 02/22/24 0500  Weight: 85.8 kg 85.5 kg 83 kg    Examination:  General exam: NAD Respiratory system: CTA Cardiovascular system: S 1, S 2 RRR Gastrointestinal system: BS present, soft t Central nervous system: alert, follows command  Extremities: no edema   Data Reviewed: I have personally reviewed following labs and imaging studies  CBC: Recent Labs  Lab 02/17/24 0419 02/21/24 1120 02/22/24 1033 02/23/24 0811  WBC 6.0 5.8 5.7 5.8  HGB 9.3* 9.3* 9.6* 9.9*  HCT 27.8* 27.9* 29.4* 30.3*  MCV 96.5 97.9 100.0 98.4  PLT 303 282 298 317   Basic Metabolic Panel: Recent Labs  Lab 02/21/24 1120 02/22/24 1033 02/23/24 0811  NA 135 136 136  K 3.8 3.7 3.8  CL 104 105 103  CO2 26 20* 24  GLUCOSE 122* 138* 128*  BUN 21 23 19   CREATININE 0.66 0.55* 0.60*  CALCIUM  8.9 9.0 9.2  MG 2.1  --   --    GFR: Estimated Creatinine Clearance: 93.1 mL/min (A) (by C-G formula based on SCr of 0.6 mg/dL (L)). Liver Function  Tests: Recent Labs  Lab 02/21/24 1120  AST 28  ALT 43  ALKPHOS 93  BILITOT 0.8  PROT 7.5  ALBUMIN 3.3*   No results for input(s): "LIPASE", "AMYLASE" in the last 168 hours. No results for input(s): "AMMONIA" in the last 168 hours. Coagulation Profile: No results for input(s): "INR", "PROTIME" in the last 168 hours. Cardiac Enzymes: No results for input(s): "CKTOTAL", "CKMB", "CKMBINDEX", "TROPONINI" in the last 168 hours. BNP (last 3 results) No results for input(s): "PROBNP" in the last 8760 hours. HbA1C: No results for input(s): "HGBA1C" in the last 72 hours. CBG: Recent Labs  Lab 02/22/24 0615 02/22/24 1127 02/22/24 1614 02/22/24 2114 02/23/24 0609  GLUCAP 136* 142* 150* 140* 164*   Lipid Profile: No results for input(s): "CHOL", "HDL", "LDLCALC", "TRIG", "CHOLHDL", "LDLDIRECT" in the last 72 hours. Thyroid  Function Tests: No results for input(s): "TSH", "T4TOTAL", "FREET4", "T3FREE", "THYROIDAB" in the last 72 hours. Anemia Panel: No results for input(s): "VITAMINB12", "FOLATE", "  FERRITIN", "TIBC", "IRON", "RETICCTPCT" in the last 72 hours. Sepsis Labs: No results for input(s): "PROCALCITON", "LATICACIDVEN" in the last 168 hours.  Recent Results (from the past 240 hours)  Respiratory (~20 pathogens) panel by PCR     Status: None   Collection Time: 02/14/24  2:32 PM   Specimen: Nasopharyngeal Swab; Respiratory  Result Value Ref Range Status   Adenovirus NOT DETECTED NOT DETECTED Final   Coronavirus 229E NOT DETECTED NOT DETECTED Final    Comment: (NOTE) The Coronavirus on the Respiratory Panel, DOES NOT test for the novel  Coronavirus (2019 nCoV)    Coronavirus HKU1 NOT DETECTED NOT DETECTED Final   Coronavirus NL63 NOT DETECTED NOT DETECTED Final   Coronavirus OC43 NOT DETECTED NOT DETECTED Final   Metapneumovirus NOT DETECTED NOT DETECTED Final   Rhinovirus / Enterovirus NOT DETECTED NOT DETECTED Final   Influenza A NOT DETECTED NOT DETECTED Final    Influenza B NOT DETECTED NOT DETECTED Final   Parainfluenza Virus 1 NOT DETECTED NOT DETECTED Final   Parainfluenza Virus 2 NOT DETECTED NOT DETECTED Final   Parainfluenza Virus 3 NOT DETECTED NOT DETECTED Final   Parainfluenza Virus 4 NOT DETECTED NOT DETECTED Final   Respiratory Syncytial Virus NOT DETECTED NOT DETECTED Final   Bordetella pertussis NOT DETECTED NOT DETECTED Final   Bordetella Parapertussis NOT DETECTED NOT DETECTED Final   Chlamydophila pneumoniae NOT DETECTED NOT DETECTED Final   Mycoplasma pneumoniae NOT DETECTED NOT DETECTED Final    Comment: Performed at [facility], [address], [place], [place] [address]         Radiology Studies: CT HEAD WO CONTRAST ( ) Result Date: 02/21/2024 CLINICAL DATA:  Provided history: Subdural hematoma. EXAM: CT HEAD WITHOUT CONTRAST TECHNIQUE: Contiguous axial images were obtained from the base of the skull through the vertex without intravenous contrast. RADIATION DOSE REDUCTION: This exam was performed according to the departmental dose-optimization program which includes automated exposure control, adjustment of the mA and/or kV according to patient size and/or use of iterative reconstruction technique. COMPARISON:  Prior head CT examinations 02/19/2024 and earlier. FINDINGS: Brain: A residual mixed density subdural hematoma along the right cerebral convexity has not significantly changed in size, again measuring up to 10 mm in thickness (remeasured on prior). Unchanged mass effect with 5 mm leftward midline shift. Persistent although decreased pneumocephalus overlying the right cerebral hemisphere. Small-volume hemorrhage layering within the left lateral ventricle occipital horn has undergone some interval redistribution, but has not changed in extent. Unchanged size and configuration of the ventricular system. No interval acute intracranial hemorrhage. No acute demarcated cortical infarct. No evidence of an intracranial mass.  Vascular: No hyperdense vessel.  Atherosclerotic calcifications. Skull: Two right parietal burr holes. Sinuses/Orbits: No orbital mass or acute orbital finding. Mild mucosal thickening scattered within the paranasal sinuses at the imaged levels. Other: Scalp staples on the right. IMPRESSION: 1. Unchanged size of a residual mixed density subdural hematoma along the right cerebral convexity. Unchanged mass effect with 5 mm leftward midline shift. 2. Persistent although decreased post-operative pneumocephalus along the right cerebral convexity. 3. Small-volume hemorrhage within the left lateral ventricle has undergone some redistribution, but has not changed in extent. 4. No new acute intracranial abnormality. Electronically Signed   By: Bascom Lily D.O.   On: 02/21/2024 13:46        Scheduled Meds:  (feeding supplement) PROSource Plus  30 mL Oral BID BM   acetaminophen   500 mg Oral TID   ALPRAZolam   0.5  mg Oral BID   arformoterol   15 mcg Nebulization BID   budesonide  (PULMICORT ) nebulizer solution  0.25 mg Nebulization BID   citalopram   20 mg Oral Daily   enoxaparin  (LOVENOX ) injection  40 mg Subcutaneous Q24H   feeding supplement  237 mL Oral BID BM   guaiFENesin   15 mL Oral Q6H   insulin  aspart  0-9 Units Subcutaneous TID AC & HS   lacosamide   200 mg Oral BID   levothyroxine   150 mcg Oral Q0600   metoprolol  tartrate  25 mg Oral BID   multivitamin with minerals  1 tablet Oral Daily   nicotine   21 mg Transdermal Daily   mouth rinse  15 mL Mouth Rinse 4 times per day   pantoprazole   40 mg Oral Daily   phenobarbital   64.8 mg Oral BID   polyethylene glycol  17 g Oral Daily   prochlorperazine  10 mg Intravenous Once   revefenacin   175 mcg Nebulization Daily   sodium chloride  flush  10-40 mL Intracatheter Q12H   sodium chloride   1 g Oral BID   Continuous Infusions:     LOS: 24 days    Time spent: 35 minutes    Dragan Tamburrino A Luismario Coston, MD [facility]   If 7PM-7AM, please  contact night-coverage [email or web address]  02/23/2024, 12:19 PM

## 2024-02-23 NOTE — Progress Notes (Signed)
 19:40H Night nurse tech informed that during their endorsement, the morning shift nurse tech said patient  asked her "where can I get a gun?". Advised AM shift NT to inform morning charge nurse. Night nurse and morning charge together saw and asked patient if he is thinking of harming himself because he is asking for a gun in the morning. According to patient "No, it's just a slip of words". Advised patient to call if he needs something or if he wants to call his wife so we can help him. He just asked for a pain medication.  22:22H Offered patient to change his gown and do perineal care, patient refused and said "kiss my A**" and "get out". Patient A&O x4. On and off irritability. Does not want to open any lights. Get angry even with dim light from the computer monitor.  06:20H Offered again patient for peri care and to change his external urinary catheter and gown but he said "No, I'm clean". Explained and education also for turning and mobilizing but patient refused.

## 2024-02-24 ENCOUNTER — Inpatient Hospital Stay (HOSPITAL_COMMUNITY): Payer: Medicare (Managed Care)

## 2024-02-24 DIAGNOSIS — S065XAA Traumatic subdural hemorrhage with loss of consciousness status unknown, initial encounter: Secondary | ICD-10-CM | POA: Diagnosis not present

## 2024-02-24 LAB — GLUCOSE, CAPILLARY
Glucose-Capillary: 139 mg/dL — ABNORMAL HIGH (ref 70–99)
Glucose-Capillary: 110 mg/dL — ABNORMAL HIGH (ref 70–99)

## 2024-02-24 MED ORDER — TRAMADOL HCL 50 MG PO TABS: 50.0000 mg | ORAL_TABLET | Freq: Four times a day (QID) | ORAL | 0 refills | Status: AC | PRN

## 2024-02-24 MED ORDER — PHENOBARBITAL 64.8 MG PO TABS
64.8000 mg | ORAL_TABLET | Freq: Two times a day (BID) | ORAL | 0 refills | Status: DC
Start: 2024-02-24 — End: 2024-04-28

## 2024-02-24 MED ORDER — GUAIFENESIN 100 MG/5ML PO LIQD: 15.0000 mL | Freq: Four times a day (QID) | ORAL | 0 refills | Status: DC

## 2024-02-24 MED ORDER — ALPRAZOLAM 0.5 MG PO TABS: 0.5000 mg | ORAL_TABLET | Freq: Two times a day (BID) | ORAL | 0 refills | Status: DC

## 2024-02-24 MED ORDER — NICOTINE 21 MG/24HR TD PT24: 21.0000 mg | MEDICATED_PATCH | Freq: Every day | TRANSDERMAL | 0 refills | Status: DC

## 2024-02-24 MED ORDER — ADULT MULTIVITAMIN W/MINERALS CH: 1.0000 | ORAL_TABLET | Freq: Every day | ORAL | 0 refills | Status: DC

## 2024-02-24 MED ORDER — METOPROLOL TARTRATE 25 MG PO TABS: 25.0000 mg | ORAL_TABLET | Freq: Two times a day (BID) | ORAL | 0 refills | Status: DC

## 2024-02-24 MED ORDER — LACOSAMIDE 200 MG PO TABS
200.0000 mg | ORAL_TABLET | Freq: Two times a day (BID) | ORAL | 0 refills | Status: DC
Start: 2024-02-24 — End: 2024-04-28

## 2024-02-24 MED ORDER — OXYCODONE HCL 5 MG PO TABS: 5.0000 mg | ORAL_TABLET | ORAL | 0 refills | Status: DC | PRN

## 2024-02-24 MED ORDER — POLYETHYLENE GLYCOL 3350 17 G PO PACK: 17.0000 g | PACK | Freq: Every day | ORAL | 0 refills | Status: DC

## 2024-02-24 MED ORDER — ACETAMINOPHEN 500 MG PO TABS: 500.0000 mg | ORAL_TABLET | Freq: Three times a day (TID) | ORAL | 0 refills | Status: DC

## 2024-02-24 MED ORDER — CITALOPRAM HYDROBROMIDE 20 MG PO TABS: 20.0000 mg | ORAL_TABLET | Freq: Every day | ORAL | 0 refills | Status: DC

## 2024-02-24 MED ORDER — PANTOPRAZOLE SODIUM 40 MG PO TBEC
40.0000 mg | DELAYED_RELEASE_TABLET | Freq: Every day | ORAL | 0 refills | Status: DC
Start: 2024-02-25 — End: 2024-04-28

## 2024-02-24 NOTE — Discharge Summary (Signed)
 Physician Discharge Summary   Patient: [name] MRN: [id number] DOB: 12-12-1960  Admit date:     01/30/2024  Discharge date: 02/24/24  Discharge Physician: Danette Duos   PCP: Claudell Cruz, MD   Recommendations at discharge:   Needs follow up with Neurosurgery in 2 weeks, he will need FU CT head in 2 weeks.  Continue to work with PT  Discharge Diagnoses: Principal Problem:   Subdural hematoma (HCC)  Resolved Problems:   * No resolved hospital problems. *  Hospital Course: 63 year old who presents by EMS from home after a fall.  On admission patient was lethargic unable to provide history.  He was found to be bradycardic heart rate 40s to 80s, hypothermic, multiple bruises, of multiple ages left hip, left thigh, left shoulder.  UDS positive for benzos.  CT showed 9 mm right subdural hematoma with 3 mm midline shift and a small volume acute SAH, cervical spine was negative.  CCM admitted patient.  Patient was found to have subdural hematoma and seizure.  His care was subsequently transferred to [facility] on 5/12.  Complicated course by fever of unknown origin ultimately found to have right upper extremity DVT probably due to PICC line, fever resolved after PICC line was removed.  He was not a candidate for anticoagulation due to moderate-sized SDH.  Neurosurgery recommendation repeated CT head was done on 5/27 which showed further worsening of SDH subsequently reevaluated by neurosurgery and underwent burr hole and placement of subdural drain for evacuation of subdural hematoma by Dr. [name] on 5/27.  Postprocedure care was resumed by ICU team.   Patient care transferred again to [facility] on 5/30. He remain stable, still requiring pain management for headaches. Stable for rehab. Repeated CT head today stable.   Assessment and Plan: Traumatic SDH/SAH: Seizure.  -Patient presented lethargic, CT head showed 9 mm right subdural hematoma.  -Fail to improved with conservative measure.   -Underwent burr hole and placement of subdural drain for evacuation of subdural hematoma by Dr. [name] on 5/27. -still having significant headaches, post procedure per family. And not better. Neurosurgery order CT head.  -pain management, needs rehab.  Continue Phenobarbital  and Vimpat .  EVD removed 5/29. Repeated CT head 5/30; reviewed by neurosurgery stable. Ok to start low dose Lovenox  for prophylasix.  Continue to have headaches, discussed with neurology could try IV reglan. Pain likely post Increase oxycodone  to 5-10 mg PRN , tylenol , tramadol .  Stable for discharge   Acute toxic metabolic encephalopathy: In setting of SDH, Seizure.    HTN:  Started On metoprolol .  IV Hydralazine  PRN.    Seizure ; - Likely provoked from SDH - LTM EEG negative - On phenobarbital  and Vimpat    Complicated  E. coli UTI: -Completed 5 days Rocephin      Right upper extremity DVT: - Related to PICC line removed 5/23 -Unable to get full dose anticoagulation due to SDH Discussed with Dr [name], CT looks stable, ok to start Lovenox  40 daily.      Severe hypothyroidism, possible myxedema secondary to noncompliance: Received IV synthroid  Continue oral Synthroid    Constipation; Started Miralax . Had BM   Pre-diabetes: SSI   COPD: Continue with  Guaifenesin .  Encourage compliance to nebulizer.    Tobacco abuse: Counseling.  Nicotine  Patch.          Pressure Injury 01/31/24 Coccyx Mid Deep Tissue Pressure Injury - Purple or maroon localized area of discolored intact skin or blood-filled blister due to damage of underlying soft tissue from  pressure and/or shear. (Active)  01/31/24 0100  Location: Coccyx  Location Orientation: Mid  Staging: Deep Tissue Pressure Injury - Purple or maroon localized area of discolored intact skin or blood-filled blister due to damage of underlying soft tissue from pressure and/or shear.  Wound Description (Comments):   Present on Admission: Yes  Dressing  Type Foam - Lift dressing to assess site every shift 02/22/24 2000        Nutrition Problem: Increased nutrient needs Etiology: chronic illness (COPD)       Signs/Symptoms: estimated needs           Consultants: Neurosurgery  Procedures performed: Janae Mclean SX Disposition: Skilled nursing facility Diet recommendation:  Discharge Diet Orders (From admission, onward)     Start     Ordered   02/24/24 0000  Diet - low sodium heart healthy        02/24/24 1254           Cardiac diet DISCHARGE MEDICATION: Allergies as of 02/24/2024       Reactions   Levetiracetam  Other (See Comments)   Severe anxiety, agitation, hyperactivity, very negative disposition   Tegretol [carbamazepine] Anxiety, Other (See Comments)   Increased seizures/ Induces seizures   Penicillins Other (See Comments)   Reaction not noted   Topiramate Other (See Comments)   Reaction not noted        Medication List     STOP taking these medications    divalproex  500 MG 24 hr tablet Commonly known as: DEPAKOTE  ER   traZODone  100 MG tablet Commonly known as: DESYREL        TAKE these medications    acetaminophen  500 MG tablet Commonly known as: TYLENOL  Take 1 tablet (500 mg total) by mouth 3 (three) times daily.   albuterol  108 (90 Base) MCG/ACT inhaler Commonly known as: VENTOLIN  HFA Inhale 2 puffs into the lungs every 4 (four) hours as needed for wheezing or shortness of breath.   ALPRAZolam  0.5 MG tablet Commonly known as: XANAX  Take 1 tablet (0.5 mg total) by mouth 2 (two) times daily. What changed:  medication strength how much to take   Calcium  High Potency/Vitamin D 600-5 MG-MCG Tabs Generic drug: Calcium  Carb-Cholecalciferol Take 1 tablet by mouth daily.   citalopram  20 MG tablet Commonly known as: CELEXA  Take 1 tablet (20 mg total) by mouth daily.   guaiFENesin  100 MG/5ML liquid Commonly known as: ROBITUSSIN Take 15 mLs by mouth every 6 (six) hours.   lacosamide  200 MG  Tabs tablet Commonly known as: VIMPAT  Take 1 tablet (200 mg total) by mouth 2 (two) times daily.   levothyroxine  150 MCG tablet Commonly known as: SYNTHROID  Take 150 mcg by mouth daily before breakfast.   metFORMIN 500 MG 24 hr tablet Commonly known as: GLUCOPHAGE-XR Take 500 mg by mouth in the morning and at bedtime.   metoprolol  tartrate 25 MG tablet Commonly known as: LOPRESSOR  Take 1 tablet (25 mg total) by mouth 2 (two) times daily.   multivitamin with minerals Tabs tablet Take 1 tablet by mouth daily. Start taking on: February 25, 2024   nicotine  21 mg/24hr patch Commonly known as: NICODERM CQ  - dosed in mg/24 hours Place 1 patch (21 mg total) onto the skin daily. Start taking on: February 25, 2024   omeprazole 20 MG capsule Commonly known as: PRILOSEC Take 20 mg by mouth daily.   oxyCODONE  5 MG immediate release tablet Commonly known as: Oxy IR/ROXICODONE  Take 1-2 tablets (5-10 mg total) by mouth every 4 (  four) hours as needed for severe pain (pain score 7-10).   pantoprazole  40 MG tablet Commonly known as: PROTONIX  Take 1 tablet (40 mg total) by mouth daily. Start taking on: February 25, 2024   PHENobarbital  64.8 MG tablet Commonly known as: LUMINAL Take 1 tablet (64.8 mg total) by mouth 2 (two) times daily.   polyethylene glycol 17 g packet Commonly known as: MIRALAX  / GLYCOLAX  Take 17 g by mouth daily. Start taking on: February 25, 2024   traMADol  50 MG tablet Commonly known as: ULTRAM  Take 1 tablet (50 mg total) by mouth every 6 (six) hours as needed for up to 3 days for moderate pain (pain score 4-6).   Trelegy Ellipta 100-62.5-25 MCG/ACT Aepb Generic drug: Fluticasone-Umeclidin-Vilant Inhale 1 puff into the lungs daily.   VITAMIN C PO Take 1 tablet by mouth daily.               Discharge Care Instructions  (From admission, onward)           Start     Ordered   02/24/24 0000  Discharge wound care:       Comments: See above   02/24/24 1254             Contact information for after-discharge care     Destination     [facility] .   Service: Skilled Nursing Contact information: [address] [place]  [phone]                    Discharge Exam: Cleavon Curls Weights   02/20/24 0500 02/22/24 0500 02/24/24 0447  Weight: 85.5 kg 83 kg 83.3 kg   General; NAD  Condition at discharge: stable  The results of significant diagnostics from this hospitalization (including imaging, microbiology, ancillary and laboratory) are listed below for reference.   Imaging Studies: CT HEAD WO CONTRAST ( ) Result Date: 02/24/2024 CLINICAL DATA:  63 year old male status post fall with subdural hematoma, postoperative day 6 status post operative subdural evacuation. EXAM: CT HEAD WITHOUT CONTRAST TECHNIQUE: Contiguous axial images were obtained from the base of the skull through the vertex without intravenous contrast. RADIATION DOSE REDUCTION: This exam was performed according to the departmental dose-optimization program which includes automated exposure control, adjustment of the mA and/or kV according to patient size and/or use of iterative reconstruction technique. COMPARISON:  02/21/2024 head CTs and earlier. FINDINGS: Brain: Decreased postoperative pneumocephalus, trace residual. Mixed density left side subdural hematoma with a mildly lobulated component lateral to the right occipital pole is now generally 6 mm in thickness along the right lateral convexity, 1 mm or so smaller since the initial postoperative head CT. Intracranial mass effect and leftward midline shift have also regressed since that time. Residual leftward midline shift is 3-4 mm, which also appears less than on 02/21/2024. Small volume hemorrhage layering in the left occipital horn is stable. Stable ventricle size and configuration. Basilar cisterns are patent. No new intracranial hemorrhage identified. Stable gray-white matter  differentiation throughout the brain. No cortically based acute infarct identified. Vascular: No suspicious intracranial vascular hyperdensity. Calcified atherosclerosis at the skull base. Skull: Stable right convexity burr holes. Sinuses/Orbits: Visualized paranasal sinuses and mastoids are stable and well aerated. Other: Postoperative changes to the right scalp. Orbits appear negative. IMPRESSION: 1. Mild additional regression of mixed density Right Subdural Hematoma since postoperative day 1. Residual SDH now 6 mm at most levels. 2. Associated decreased intracranial mass effect. Residual leftward midline shift now 3-4 mm. 3.  Stable small volume IVH. 4. No new intracranial abnormality. Electronically Signed   By: Marlise Simpers M.D.   On: 02/24/2024 10:09   CT HEAD WO CONTRAST ( ) Result Date: 02/21/2024 CLINICAL DATA:  Provided history: Subdural hematoma. EXAM: CT HEAD WITHOUT CONTRAST TECHNIQUE: Contiguous axial images were obtained from the base of the skull through the vertex without intravenous contrast. RADIATION DOSE REDUCTION: This exam was performed according to the departmental dose-optimization program which includes automated exposure control, adjustment of the mA and/or kV according to patient size and/or use of iterative reconstruction technique. COMPARISON:  Prior head CT examinations 02/19/2024 and earlier. FINDINGS: Brain: A residual mixed density subdural hematoma along the right cerebral convexity has not significantly changed in size, again measuring up to 10 mm in thickness (remeasured on prior). Unchanged mass effect with 5 mm leftward midline shift. Persistent although decreased pneumocephalus overlying the right cerebral hemisphere. Small-volume hemorrhage layering within the left lateral ventricle occipital horn has undergone some interval redistribution, but has not changed in extent. Unchanged size and configuration of the ventricular system. No interval acute intracranial hemorrhage. No  acute demarcated cortical infarct. No evidence of an intracranial mass. Vascular: No hyperdense vessel.  Atherosclerotic calcifications. Skull: Two right parietal burr holes. Sinuses/Orbits: No orbital mass or acute orbital finding. Mild mucosal thickening scattered within the paranasal sinuses at the imaged levels. Other: Scalp staples on the right. IMPRESSION: 1. Unchanged size of a residual mixed density subdural hematoma along the right cerebral convexity. Unchanged mass effect with 5 mm leftward midline shift. 2. Persistent although decreased post-operative pneumocephalus along the right cerebral convexity. 3. Small-volume hemorrhage within the left lateral ventricle has undergone some redistribution, but has not changed in extent. 4. No new acute intracranial abnormality. Electronically Signed   By: Bascom Lily D.O.   On: 02/21/2024 13:46   CT HEAD WO CONTRAST Result Date: 02/19/2024 CLINICAL DATA:  63 year old male with altered mental status, fall, subdural hematoma which had progressed yesterday. Postoperative day 1 status post subdural drainage/evacuation. EXAM: CT HEAD WITHOUT CONTRAST TECHNIQUE: Contiguous axial images were obtained from the base of the skull through the vertex without intravenous contrast. RADIATION DOSE REDUCTION: This exam was performed according to the departmental dose-optimization program which includes automated exposure control, adjustment of the mA and/or kV according to patient size and/or use of iterative reconstruction technique. COMPARISON:  Head CT yesterday. FINDINGS: Brain: Interval right side burr hole placement and subdural drain in place. Mixed density right side subdural hematoma has decreased to 7-8 mm thickness now at most levels on coronal images. Small volume postoperative pneumocephalus. Intracranial mass effect has significantly regressed, leftward midline shift has decreased to 5 mm (previously 10 mm) and right lateral ventricle patency has improved. Left  lateral ventricle size stable to slightly smaller. Small volume of left occipital horn hemorrhage is stable. Basilar cisterns appear stable to improved. No new intracranial hemorrhage. Maintained gray-white differentiation. No cortically based acute infarct identified. Vascular: Calcified atherosclerosis at the skull base. No suspicious intracranial vascular hyperdensity. Skull: 2 new right superior convexity burr holes.  Otherwise intact. Sinuses/Orbits: Visualized paranasal sinuses and mastoids are stable and well aerated. Other: New postoperative changes to the right scalp with skin staples in place. Percutaneous drain in place. Disconjugate gaze. IMPRESSION: 1. Postoperative regression of mixed density right side SDH, residual now 7-8 mm at most levels. 2. Decreased intracranial mass effect, residual leftward midline shift is 5 mm. Stable small volume IVH. 3. No new intracranial abnormality. Electronically Signed   By:  Marlise Simpers M.D.   On: 02/19/2024 04:58   CT HEAD WO CONTRAST ( ) Addendum Date: 02/18/2024 ADDENDUM REPORT: 02/18/2024 06:39 ADDENDUM: Study discussed by telephone with Dr. Sundil on 02/18/2024 at 0605 hours. Electronically Signed   By: Marlise Simpers M.D.   On: 02/18/2024 06:39   Result Date: 02/18/2024 CLINICAL DATA:  63 year old male with history of altered mental status, fall, subdural hematomas. EXAM: CT HEAD WITHOUT CONTRAST TECHNIQUE: Contiguous axial images were obtained from the base of the skull through the vertex without intravenous contrast. RADIATION DOSE REDUCTION: This exam was performed according to the departmental dose-optimization program which includes automated exposure control, adjustment of the mA and/or kV according to patient size and/or use of iterative reconstruction technique. COMPARISON:  Head CT 02/04/2024 and earlier. FINDINGS: Brain: Increased and now mixed density right side subdural hematoma since 02/04/2024, up to 14 mm thickness now on coronal images (coronal image  39) versus 11 mm at approximately the same level 02/04/2024. Associated increased intracranial mass effect and leftward midline shift. Midline shift now 9-10 mm (previously 4-5 mm. Increased mass effect on the right lateral ventricle. Mildly trapped appearance of the left lateral ventricle which has not significantly changed along with a small volume of layering intraventricular hemorrhage (series 2, image 21). Basilar cistern patency not significantly changed, maintained. Stable gray-white matter differentiation throughout the brain. No other intracranial hemorrhage identified. Vascular: No suspicious intracranial vascular hyperdensity. Skull: Appears stable and intact. Sinuses/Orbits: Visualized paranasal sinuses and mastoids are stable and well aerated. Other: No acute orbit or scalp soft tissue finding identified. IMPRESSION: 1. Progressed and now mixed density Right Subdural Hematoma: 14 mm thickness (11 mm on 02/04/2024) with increased intracranial mass effect and leftward midline shift now 9-10 mm (previously 4-5 mm). 2. Increased mass effect on the right lateral ventricle. Mildly trapped appearance of the left lateral ventricle stable along with small volume layering intraventricular hemorrhage. 3. No skull fracture identified. Electronically Signed: By: Marlise Simpers M.D. On: 02/18/2024 05:55   VAS US  UPPER EXTREMITY VENOUS DUPLEX Result Date: 02/15/2024 UPPER VENOUS STUDY  Patient Name:  WYETT NARINE  Date of Exam:   02/14/2024 Medical Rec #: [id number]      Accession #:    [id number] Date of Birth: 1961-09-19      Patient Gender: M Patient Age:   22 years Exam Location:  [facility] Procedure:      VAS US  UPPER EXTREMITY VENOUS DUPLEX Referring Phys: TRUNG VU --------------------------------------------------------------------------------  Indications: FUO Risk Factors: Right PICC. Limitations: Poor ultrasound/tissue interface and patient positioning, patient immobility, patient guarding left arm.  Comparison Study: No prior studies. Performing Technologist: Lerry Ransom RVT  Examination Guidelines: A complete evaluation includes B-mode imaging, spectral Doppler, color Doppler, and power Doppler as needed of all accessible portions of each vessel. Bilateral testing is considered an integral part of a complete examination. Limited examinations for reoccurring indications may be performed as noted.  Right Findings: +----------+------------+---------+-----------+----------+-------+ RIGHT     CompressiblePhasicitySpontaneousPropertiesSummary +----------+------------+---------+-----------+----------+-------+ IJV           Full       Yes       Yes                      +----------+------------+---------+-----------+----------+-------+ Subclavian               Yes       Yes                      +----------+------------+---------+-----------+----------+-------+  Axillary      None       No        No                Acute  +----------+------------+---------+-----------+----------+-------+ Brachial    Partial      Yes       Yes               Acute  +----------+------------+---------+-----------+----------+-------+ Radial        Full                                          +----------+------------+---------+-----------+----------+-------+ Ulnar         Full                                          +----------+------------+---------+-----------+----------+-------+ Cephalic      Full                                          +----------+------------+---------+-----------+----------+-------+ Basilic       Full                                          +----------+------------+---------+-----------+----------+-------+  Left Findings: +----------+------------+---------+-----------+----------+--------------+ LEFT      CompressiblePhasicitySpontaneousProperties   Summary     +----------+------------+---------+-----------+----------+--------------+ IJV            Full       Yes       Yes                             +----------+------------+---------+-----------+----------+--------------+ Subclavian               Yes       Yes                             +----------+------------+---------+-----------+----------+--------------+ Axillary      Full       Yes       Yes                             +----------+------------+---------+-----------+----------+--------------+ Brachial                                            Not visualized +----------+------------+---------+-----------+----------+--------------+ Radial                                              Not visualized +----------+------------+---------+-----------+----------+--------------+ Ulnar                                               Not visualized +----------+------------+---------+-----------+----------+--------------+  Cephalic      None                                      Acute      +----------+------------+---------+-----------+----------+--------------+ Basilic       Full                                                 +----------+------------+---------+-----------+----------+--------------+ Thrombus located in the cephalic vein is noted to extend from the proximal upper arm into the distal upper arm. Unable to visualize cephalic vein in the [facility] fossa to determine involvement.  Summary:  Right: No evidence of superficial vein thrombosis in the upper extremity. Findings consistent with acute deep vein thrombosis involving the right axillary vein and right brachial veins.  Left: Findings consistent with acute superficial vein thrombosis involving the left cephalic vein.  *See table(s) above for measurements and observations.  Diagnosing physician: Irvin Mantel Electronically signed by Irvin Mantel on 02/15/2024 at 10:14:30 AM.    Final    VAS US  LOWER EXTREMITY VENOUS (DVT) Result Date: 02/15/2024  Lower Venous DVT Study Patient Name:  EASON HOUSMAN   Date of Exam:   02/14/2024 Medical Rec #: [id number]      Accession #:    [id number] Date of Birth: 24-Apr-1961      Patient Gender: M Patient Age:   45 years Exam Location:  [facility] Procedure:      VAS US  LOWER EXTREMITY VENOUS (DVT) Referring Phys: Kimberly Penna --------------------------------------------------------------------------------  Indications: Swelling, and fever.  Comparison Study: No priors. Performing Technologist: Franky Ivanoff Sturdivant-Jones RDMS, RVT  Examination Guidelines: A complete evaluation includes B-mode imaging, spectral Doppler, color Doppler, and power Doppler as needed of all accessible portions of each vessel. Bilateral testing is considered an integral part of a complete examination. Limited examinations for reoccurring indications may be performed as noted. The reflux portion of the exam is performed with the patient in reverse Trendelenburg.  +---------+---------------+---------+-----------+----------+--------------+ RIGHT    CompressibilityPhasicitySpontaneityPropertiesThrombus Aging +---------+---------------+---------+-----------+----------+--------------+ CFV      Full           Yes      Yes                                 +---------+---------------+---------+-----------+----------+--------------+ SFJ      Full                                                        +---------+---------------+---------+-----------+----------+--------------+ FV Prox  Full                                                        +---------+---------------+---------+-----------+----------+--------------+ FV Mid   Full                                                        +---------+---------------+---------+-----------+----------+--------------+  FV DistalFull                                                        +---------+---------------+---------+-----------+----------+--------------+ PFV      Full                                                         +---------+---------------+---------+-----------+----------+--------------+ POP      Full           Yes      Yes                                 +---------+---------------+---------+-----------+----------+--------------+ PTV      Full                                                        +---------+---------------+---------+-----------+----------+--------------+ PERO     Full                                                        +---------+---------------+---------+-----------+----------+--------------+   +---------+---------------+---------+-----------+----------+--------------+ LEFT     CompressibilityPhasicitySpontaneityPropertiesThrombus Aging +---------+---------------+---------+-----------+----------+--------------+ CFV      Full           Yes      Yes                                 +---------+---------------+---------+-----------+----------+--------------+ SFJ      Full                                                        +---------+---------------+---------+-----------+----------+--------------+ FV Prox  Full                                                        +---------+---------------+---------+-----------+----------+--------------+ FV Mid   Full                                                        +---------+---------------+---------+-----------+----------+--------------+ FV DistalFull                                                        +---------+---------------+---------+-----------+----------+--------------+  PFV      Full                                                        +---------+---------------+---------+-----------+----------+--------------+ POP      Full           Yes      Yes                                 +---------+---------------+---------+-----------+----------+--------------+ PTV      Full                                                         +---------+---------------+---------+-----------+----------+--------------+ PERO     Full                                                        +---------+---------------+---------+-----------+----------+--------------+     Summary: BILATERAL: - No evidence of deep vein thrombosis seen in the lower extremities, bilaterally. -No evidence of popliteal cyst, bilaterally.   *See table(s) above for measurements and observations. Electronically signed by Irvin Mantel on 02/15/2024 at 10:13:08 AM.    Final    CT CHEST WO CONTRAST Result Date: 02/13/2024 CLINICAL DATA:  Respiratory illness, nondiagnostic x-ray. Fever and cough, possible pneumonia. EXAM: CT CHEST WITHOUT CONTRAST TECHNIQUE: Multidetector CT imaging of the chest was performed following the standard protocol without IV contrast. RADIATION DOSE REDUCTION: This exam was performed according to the departmental dose-optimization program which includes automated exposure control, adjustment of the mA and/or kV according to patient size and/or use of iterative reconstruction technique. COMPARISON:  02/12/2024, 09/06/2023. FINDINGS: Cardiovascular: The heart is normal in size of there is a trace pericardial effusion. Coronary artery calcifications are noted. The distal tip of a right PICC line terminates in the right atrium. There is atherosclerotic calcification of the aorta without evidence of aneurysm. The pulmonary trunk is normal in caliber. Mediastinum/Nodes: No mediastinal or axillary lymphadenopathy is seen. Evaluation of the hila is limited due to lack of IV contrast. The thyroid  gland, trachea, and esophagus are within normal limits. Lungs/Pleura: Paraseptal and centrilobular emphysematous changes are noted in the lungs. Atelectasis is noted bilaterally. No effusion or pneumothorax is seen. No definite pulmonary nodule or mass is seen. Upper Abdomen: A few scattered subcentimeter hypodensities are present in the liver which are too small to  further characterize. No acute abnormality. Musculoskeletal: There is stable bony deformity in the proximal left humerus. Degenerative changes are present in the thoracic spine. No acute osseous abnormality is seen. IMPRESSION: 1. No acute intrathoracic process. 2. Emphysema. 3. Coronary artery calcification. 4. Aortic atherosclerosis. Electronically Signed   By: Wyvonnia Heimlich M.D.   On: 02/13/2024 19:50   DG Chest Port 1V same Day Result Date: 02/12/2024 CLINICAL DATA:  Fever. EXAM: PORTABLE CHEST 1 VIEW COMPARISON:  Feb 09, 2024. FINDINGS: The heart size and mediastinal contours are within normal limits. Stable right-sided  PICC line. Both lungs are clear. The visualized skeletal structures are unremarkable. IMPRESSION: No active disease. Electronically Signed   By: Rosalene Colon M.D.   On: 02/12/2024 14:55   DG Swallowing Func-Speech Pathology Result Date: 02/10/2024 Table formatting from the original result was not included. Modified Barium Swallow Study Patient Details Name: Eladio Dentremont MRN: [id number] Date of Birth: Sep 14, 1961 Today's Date: 02/10/2024 HPI/PMH: HPI: Pt is a 63 y.o. male who was brought to ED by EMS from home after a fall. CT head (01/30/23) revealed "Slightly increased size of right larger than left subdural hematomas. Small volume subarachnoid hemorrhage with mildly increased hemorrhage in the left lateral ventricle. Failed yale 02/01/24. PMH: Seizure disorder, COPD; ST f/u for po readiness in acute setting; pt currently has TF for nutritive/hydration purposes. Clinical Impression: Clinical Impression: Pt presents with functional airway protection but oral dysphagia, which is suspected to be secondary to cognitive factors. His positioning was suboptimal as his shoulders were drawn up, prohibiting a complete view of the trachea. Trace penetration was observed with thin liquids but was expelled as complete laryngeal elevation occurred (PAS 2, considered WFL). Of note, there was frequent  coughing and throat clearance throughout the study in the absence of airway invasion. Ultimately, there was no significant residue but pt had increased difficulty with initiating mastication when presented solids. He initially declined to try solids so question effort. Attempted to present the 13 mm barium tablet with thin liquids but pt expectorated it before swallowing and it was not reattempted. Suspect his level of alertness may fluctuate given administration of antiepileptics which have been observed to cause lethargy but feel he could start a diet if given full supervision. Since observation of mastication was limited, recommend starting with Dys 2 solids and thin liquids. Recommend pills whole with puree or via Cortrak. SLP will continue following but do not anticipate post-acute needs for swallowing. Factors that may increase risk of adverse event in presence of aspiration Roderick Civatte & Jessy Morocco 2021): Factors that may increase risk of adverse event in presence of aspiration Roderick Civatte & Jessy Morocco 2021): Reduced cognitive function; Limited mobility; Dependence for feeding and/or oral hygiene; Presence of tubes (ETT, trach, NG, etc.) Recommendations/Plan: Swallowing Evaluation Recommendations Swallowing Evaluation Recommendations Recommendations: PO diet PO Diet Recommendation: Dysphagia 2 (Finely chopped); Thin liquids (Level 0) Liquid Administration via: Cup; Straw Medication Administration: Whole meds with puree Supervision: Full supervision/cueing for swallowing strategies; Staff to assist with self-feeding Swallowing strategies  : Minimize environmental distractions; Slow rate Postural changes: Position pt fully upright for meals; Stay upright 30-60 min after meals Oral care recommendations: Oral care BID (2x/day) Treatment Plan Treatment Plan Treatment recommendations: Therapy as outlined in treatment plan below Follow-up recommendations: Skilled nursing-short term rehab (<3 hours/day) Functional status  assessment: Patient has had a recent decline in their functional status and demonstrates the ability to make significant improvements in function in a reasonable and predictable amount of time. Treatment frequency: Min 2x/week Treatment duration: 1 week Interventions: Compensatory techniques; Patient/family education; Trials of upgraded texture/liquids Recommendations Recommendations for follow up therapy are one component of a multi-disciplinary discharge planning process, led by the attending physician.  Recommendations may be updated based on patient status, additional functional criteria and insurance authorization. Assessment: Orofacial Exam: Orofacial Exam Oral Cavity: Oral Hygiene: WFL Oral Cavity - Dentition: Edentulous; Dentures, not available Orofacial Anatomy: WFL Oral Motor/Sensory Function: WFL Anatomy: Anatomy: Suspected cervical osteophytes Boluses Administered: Boluses Administered Boluses Administered: Thin liquids (Level 0); Mildly thick liquids (Level 2, nectar thick);  Moderately thick liquids (Level 3, honey thick); Puree; Solid  Oral Impairment Domain: Oral Impairment Domain Lip Closure: Interlabial escape, no progression to anterior lip Tongue control during bolus hold: Not tested Bolus preparation/mastication: Timely and efficient chewing and mashing Bolus transport/lingual motion: Brisk tongue motion Oral residue: Complete oral clearance Location of oral residue : N/A Initiation of pharyngeal swallow : Valleculae  Pharyngeal Impairment Domain: Pharyngeal Impairment Domain Soft palate elevation: No bolus between soft palate (SP)/pharyngeal wall (PW) Laryngeal elevation: Complete superior movement of thyroid  cartilage with complete approximation of arytenoids to epiglottic petiole Anterior hyoid excursion: Complete anterior movement Epiglottic movement: Complete inversion Laryngeal vestibule closure: Complete, no air/contrast in laryngeal vestibule Pharyngeal stripping wave : Present - complete  Pharyngeal contraction (A/P view only): N/A Pharyngoesophageal segment opening: Complete distension and complete duration, no obstruction of flow Tongue base retraction: No contrast between tongue base and posterior pharyngeal wall (PPW) Pharyngeal residue: Complete pharyngeal clearance Location of pharyngeal residue: N/A  Esophageal Impairment Domain: No data recorded Pill: Pill Consistency administered: Thin liquids (Level 0) Thin liquids (Level 0): Impaired (see clinical impressions) Penetration/Aspiration Scale Score: Penetration/Aspiration Scale Score 1.  Material does not enter airway: Mildly thick liquids (Level 2, nectar thick); Moderately thick liquids (Level 3, honey thick); Puree; Solid 2.  Material enters airway, remains ABOVE vocal cords then ejected out: Thin liquids (Level 0) Compensatory Strategies: Compensatory Strategies Compensatory strategies: No   General Information: Caregiver present: No  Diet Prior to this Study: NPO; Cortrak/Small bore NG tube   Temperature : Normal   Respiratory Status: WFL   Supplemental O2: None (Room air)   History of Recent Intubation: No  Behavior/Cognition: Alert; Cooperative; Requires cueing Self-Feeding Abilities: Dependent for feeding Baseline vocal quality/speech: Normal Volitional Cough: Able to elicit Volitional Swallow: Able to elicit Exam Limitations: Poor participation; Poor positioning Goal Planning: Prognosis for improved oropharyngeal function: Fair Barriers to Reach Goals: Cognitive deficits No data recorded Patient/Family Stated Goal: to have PO intake and Cortrak removed Consulted and agree with results and recommendations: Patient Pain: Pain Assessment Pain Assessment: Faces Pain Score: 10 Faces Pain Scale: 0 Pain Location: "all over" Pain Descriptors / Indicators: Aching Pain Intervention(s): Monitored during session End of Session: Start Time:SLP Start Time (ACUTE ONLY): 1345 Stop Time: SLP Stop Time (ACUTE ONLY): 1406 Time Calculation:SLP Time  Calculation (min) (ACUTE ONLY): 21 min Charges: SLP Evaluations $ SLP Speech Visit: 1 Visit SLP Evaluations $MBS Swallow: 1 Procedure $Swallowing Treatment: 1 Procedure SLP visit diagnosis: SLP Visit Diagnosis: Dysphagia, oropharyngeal phase (R13.12) Past Medical History: Past Medical History: Diagnosis Date  Anemia   Depression   Diabetes mellitus without complication (HCC)   Emphysema of lung (HCC)   GERD (gastroesophageal reflux disease)   Hiatal hernia   Hyperlipidemia   Hypertension   Memory loss   Seizures (HCC)   Thyroid  disease  Past Surgical History: Past Surgical History: Procedure Laterality Date  APPENDECTOMY    DENTAL SURGERY    EYE SURGERY    TONSILLECTOMY AND ADENOIDECTOMY   Amil Kale, M.A., CCC-SLP Speech Language Pathology, Acute Rehabilitation Services Secure Chat preferred [phone] 02/10/2024, 3:17 PM  DG Chest Port 1 View Result Date: 02/09/2024 CLINICAL DATA:  Fever. EXAM: PORTABLE CHEST 1 VIEW COMPARISON:  Feb 04, 2024. FINDINGS: Stable cardiomediastinal silhouette. Feeding tube is seen entering stomach. No acute pulmonary disease. Right-sided PICC line is noted with tip in expected position of cavoatrial junction. Bony thorax is unremarkable. IMPRESSION: No active disease. Electronically Signed   By: Rosalene Colon  M.D.   On: 02/09/2024 14:27   US  EKG SITE RITE Result Date: 02/05/2024 If Site Rite image not attached, placement could not be confirmed due to current cardiac rhythm.  Overnight EEG with video Result Date: 02/05/2024 Arleene Lack, MD     02/06/2024  9:53 AM Patient Name: Mattias Walmsley MRN: [id number] Epilepsy Attending: Arleene Lack Referring Physician/Provider: Arleene Lack, MD Duration: 02/04/2024 1724 to 02/05/2024 1724 Patient history: 63yo M with right sub dural hematoma. EEG to evaluate for seizure  Level of alertness: Awake/lethargic  AEDs during EEG study: VPA, Xanax , LCM  Technical aspects: This EEG study was done with scalp electrodes positioned  according to the 10-20 International system of electrode placement. Electrical activity was reviewed with band pass filter of 1-70Hz , sensitivity of 7 uV/mm, display speed of 64mm/sec with a 60Hz  notched filter applied as appropriate. EEG data were recorded continuously and digitally stored.  Video monitoring was available and reviewed as appropriate.  Description: EEG showed continuous generalized and lateralized right hemisphere 3 to 7 Hz theta-delta slowing. Rhythmic sharply contoured 3-4hz  theta-delta slowing was noted in right hemisphere with waxing and waning morphology. Hyperventilation and photic stimulation were not performed.   Seizures without clinical signs were noted in right posterior quadrant. During seizure, eeg showed lateralized periodic discharges at 1.5-2hz  in right hemisphere, maximal right posterior quadrant which then evolved into 2.5-3hz  delta slowing admixed with spikes and involved left hemisphere. Average 3-6 seizures were noted per hour, lasting about 2 minutes each. As medications were adjusted, seizures resolved after around 1220 on 5/14/20255.  Subsequently EEG showed continuous generalized 3 to 6 Hz theta-delta slowing admixed with 12 to 13 Hz beta activity.  ABNORMALITY - Seizures without clinical signs, right posterior quadrant. - Lateralized rhythmic delta slowing, right hemisphere ( LRDA) - Continuous slow, generalized and lateralized right hemisphere  IMPRESSION: This study showed seizures without clinical signs arising from  right posterior quadrant. Average 3-6 seizures were noted per hour, lasting about 2 minutes each. As medications were adjusted, seizures resolved after around 1220 on 5/14/20255. Additionally there was cortical dysfunction arising from right hemisphere likely secondary to underlying structural abnormality/ subdural hematoma. Lastly there was moderate diffuse encephalopathy.   Arleene Lack   EEG adult Result Date: 02/04/2024 Arleene Lack, MD      02/04/2024  4:44 PM Patient Name: Erinn Huskins MRN: [id number] Epilepsy Attending: Arleene Lack Referring Physician/Provider: Cala Castleman, MD Date : 02/04/2024 Duration: 22.53 mins Patient history: 63yo M with right sub dural hematoma. EEG to evaluate for seizure Level of alertness: Awake AEDs during EEG study: VPA, Xanax  Technical aspects: This EEG study was done with scalp electrodes positioned according to the 10-20 International system of electrode placement. Electrical activity was reviewed with band pass filter of 1-70Hz , sensitivity of 7 uV/mm, display speed of 51mm/sec with a 60Hz  notched filter applied as appropriate. EEG data were recorded continuously and digitally stored.  Video monitoring was available and reviewed as appropriate. Description: EEG showed continuous generalized and lateralized right hemisphere 3 to 7 Hz theta-delta slowing. Rhythmic sharply contoured 3-4hz  theta-delta slowing was noted in right hemisphere with waxing and waning morphology. Hyperventilation and photic stimulation were not performed.   ABNORMALITY - Lateralized rhythmic delta slowing, right hemisphere ( LRDA) - Continuous slow, generalized and lateralized right hemisphere IMPRESSION: This study is suggestive of cortical dysfunction arising from right hemisphere likely secondary to underlying structural abnormality/ subdural hematoma. This EEG pattern is on the ictal-interictal continuum with  higher potential for seizure recurrence. Consider long term eeg monitoring if concern for ictal-interictal activity persists. Additionally there is moderate diffuse encephalopathy. No definite seizures were seen throughout the recording. Priyanka [name] Erps   CT HEAD WO CONTRAST ( ) Addendum Date: 02/04/2024 ADDENDUM REPORT: 02/04/2024 09:50 ADDENDUM: Study discussed by telephone with Dr. Lynnwood Sauer on 02/04/2024 at 0906 hours. Electronically Signed   By: Marlise Simpers M.D.   On: 02/04/2024 09:50   Result Date:  02/04/2024 CLINICAL DATA:  63 year old male with altered mental status and subdural hematoma after a fall earlier this month. EXAM: CT HEAD WITHOUT CONTRAST TECHNIQUE: Contiguous axial images were obtained from the base of the skull through the vertex without intravenous contrast. RADIATION DOSE REDUCTION: This exam was performed according to the departmental dose-optimization program which includes automated exposure control, adjustment of the mA and/or kV according to patient size and/or use of iterative reconstruction technique. COMPARISON:  Head CTs 01/31/2024 and earlier. FINDINGS: Brain: Hyperdense right side hemispheric subdural hematoma has increased along with intracranial mass effect since 01/31/2024. On coronal images the SDH is now up to 11 mm in thickness (coronal image 38) versus about 7 mm at the same level previously. Increased mass effect on the right lateral ventricle. Increased leftward midline shift from trace now. No uncal herniation at this time and basilar cisterns remain patent. Contralateral smaller left side subdural hematoma appears largely resolved since 01/31/2024. A small volume of layering subarachnoid hemorrhage in the left sylvian fissure is increased on series 4, image 17. No ventriculomegaly. Small volume layering occipital horn intraventricular blood is stable. Lobulated hyperdense SDH along the right tentorium also mildly increased. Additional IVH versus linear hemorrhagic contusion at the junction of the right temporal and occipital lobes on series 4, image 12 is now apparent. No superimposed acute or chronic cortically based infarct identified. Stable gray-white differentiation. No cortically based acute infarct identified. Vascular: No suspicious intracranial vascular hyperdensity. Skull: Appears stable and intact. Sinuses/Orbits: Left nasoenteric tube remains in place. Visualized paranasal sinuses and mastoids are stable and well aerated. Other: Mild Disconjugate gaze is  similar. Stable scalp soft tissues, no measurable scalp hematoma. IMPRESSION: 1. Largely resolved left side but Increased Right Side Subdural Hematoma since 01/31/2024. Right SDH now up to 11 mm with increased leftward midline shift, (now 4-5 mm) and increased mass effect on the right lateral ventricle. 2. Small volume SAH now apparent. Stable small volume IVH. No acute ventriculomegaly. Possible small superimposed right posterior Emma sphere hemorrhagic contusion. 3. No skull fracture identified. Electronically Signed: By: Marlise Simpers M.D. On: 02/04/2024 09:00   DG Chest Port 1 View Result Date: 02/04/2024 CLINICAL DATA:  Shortness of breath. EXAM: PORTABLE CHEST 1 VIEW COMPARISON:  02/03/2024 FINDINGS: Low volume film. Right lung clear. Streaky density seen at the left base has become more patchy in the interval. Cardiopericardial silhouette is at upper limits of normal for size. NG tube passes into the stomach with tip appearing to be positioned in the region of the gastric fundus. Telemetry leads overlie the chest. IMPRESSION: Low volume film with increased patchy opacity at the left base compatible with atelectasis or infiltrate. Electronically Signed   By: Donnal Fusi M.D.   On: 02/04/2024 07:10   EEG adult Result Date: 02/03/2024 Arleene Lack, MD     02/03/2024 11:41 AM Patient Name: Lonnie Reth MRN: [id number] Epilepsy Attending: Arleene Lack Referring Physician/Provider: Brayton Calin, MD Date: 02/03/2024 Duration: 23.26 mins Patient history: 63yo M with ams. EEG to evaluate  for seizure Level of alertness: Awake, asleep AEDs during EEG study: VPA, Xanax  Technical aspects: This EEG study was done with scalp electrodes positioned according to the 10-20 International system of electrode placement. Electrical activity was reviewed with band pass filter of 1-70Hz , sensitivity of 7 uV/mm, display speed of 64mm/sec with a 60Hz  notched filter applied as appropriate. EEG data were recorded continuously  and digitally stored.  Video monitoring was available and reviewed as appropriate. Description: The posterior dominant rhythm consists of 8 Hz activity of moderate voltage (25-35 uV) seen predominantly in posterior head regions, symmetric and reactive to eye opening and eye closing. Sleep was characterized by vertex waves, sleep spindles (12 to 14 Hz), maximal frontocentral region. EEG showed intermittent generalized 3 to 6 Hz theta-delta slowing. Hyperventilation and photic stimulation were not performed.   ABNORMALITY - Intermittent slow, generalized IMPRESSION: This study is suggestive of mild diffuse encephalopathy. No seizures or epileptiform discharges were seen throughout the recording. Arleene Lack   DG Chest Port 1 View Result Date: 02/03/2024 CLINICAL DATA:  Shortness of breath. EXAM: PORTABLE CHEST 1 VIEW COMPARISON:  01/30/2024 FINDINGS: Cardiopericardial silhouette is at upper limits of normal for size. Streaky density at the left base suggest atelectasis. No edema or focal dense airspace consolidation. No pleural effusion. NG tube tip is in the proximal stomach. Posttraumatic deformity noted left proximal humerus. Telemetry leads overlie the chest. IMPRESSION: Streaky density at the left base suggest atelectasis. Electronically Signed   By: Donnal Fusi M.D.   On: 02/03/2024 07:16   DG Abd Portable 1V Result Date: 02/01/2024 CLINICAL DATA:  Nasogastric tube placement. EXAM: PORTABLE ABDOMEN - 1 VIEW COMPARISON:  Radiograph earlier today FINDINGS: New enteric tube tip and side-port below the diaphragm in the stomach. No bowel dilatation to suggest obstruction. IMPRESSION: Enteric tube tip and side-port below the diaphragm in the stomach. Electronically Signed   By: Chadwick Colonel M.D.   On: 02/01/2024 22:57   DG Abd Portable 1V Result Date: 02/01/2024 CLINICAL DATA:  Orogastric tube placement. EXAM: PORTABLE ABDOMEN - 1 VIEW COMPARISON:  01/31/2023 FINDINGS: The weighted enteric tube is  been retracted, the tip is in the distal esophagus. Advancement of at least 10 cm to place the tip in the stomach, 15 cm to place the tip in the distal stomach. Nonobstructive upper abdominal bowel gas pattern. IMPRESSION: Retraction of weighted enteric tube, tip in the distal esophagus. Advancement of at least 10 cm to place the tip in the stomach, 15 cm would place the tip in the distal stomach. Electronically Signed   By: Chadwick Colonel M.D.   On: 02/01/2024 22:05   CT HEAD WO CONTRAST ( ) Result Date: 01/31/2024 CLINICAL DATA:  Subdural hematoma. EXAM: CT HEAD WITHOUT CONTRAST TECHNIQUE: Contiguous axial images were obtained from the base of the skull through the vertex without intravenous contrast. RADIATION DOSE REDUCTION: This exam was performed according to the departmental dose-optimization program which includes automated exposure control, adjustment of the mA and/or kV according to patient size and/or use of iterative reconstruction technique. COMPARISON:  Head CT 01/30/2024 FINDINGS: Brain: A subdural hematoma over the right cerebral convexity demonstrates mild interval redistribution but also has likely slightly increased in volume, with a maximal thickness 10 mm (previously 9 mm). A thin subdural hematoma over the left cerebral convexity has also mildly enlarged, with a maximal thickness of 4 mm. Subdural hemorrhage is again seen along the tentorium, right greater than left, and there is also a small amount along the  falx. Small volume subarachnoid hemorrhage is again noted, and there is increased, small volume hemorrhage in the occipital horn of the left lateral ventricle. Mass effect on the right cerebral hemisphere including partial effacement the right lateral ventricle and 3 mm of leftward midline shift is unchanged. No acute infarct or hydrocephalus is evident. Vascular: No hyperdense vessel. Skull: No acute fracture or suspicious lesion. Sinuses/Orbits: Mild mucosal thickening in the left  sphenoid and right maxillary sinuses. Clear mastoid air cells. Unremarkable orbits. Other: None. IMPRESSION: 1. Slightly increased size of right larger than left subdural hematomas. Unchanged mass effect and 3 mm of leftward midline shift. 2. Small volume subarachnoid hemorrhage with mildly increased hemorrhage in the left lateral ventricle, possibly redistribution. Electronically Signed   By: Aundra Lee M.D.   On: 01/31/2024 14:24   DG Abd Portable 1V Result Date: 01/31/2024 CLINICAL DATA:  Enteric tube placement EXAM: PORTABLE ABDOMEN - 1 VIEW COMPARISON:  CT abdomen and pelvis dated 09/06/2023 FINDINGS: Gastric/enteric tube tip projects over the gastric fundus. Partially imaged bowel gas pattern is nonobstructive. IMPRESSION: Gastric/enteric tube tip projects over the gastric fundus. Electronically Signed   By: Limin  Xu M.D.   On: 01/31/2024 13:16   ECHOCARDIOGRAM COMPLETE Result Date: 01/31/2024    ECHOCARDIOGRAM REPORT   Patient Name:   EUAL LINDSTROM Date of Exam: 01/31/2024 Medical Rec #:  [id number]     Height:       66.0 in Accession #:    [id number]    Weight:       201.3 lb Date of Birth:  06/13/1961     BSA:          2.005 m Patient Age:    62 years      BP:           145/107 mmHg Patient Gender: M             HR:           61 bpm. Exam Location:  Inpatient Procedure: 2D Echo, Color Doppler, Cardiac Doppler and Intracardiac            Opacification Agent (Both Spectral and Color Flow Doppler were            utilized during procedure). Indications:    Stroke i63.9  History:        Patient has prior history of Echocardiogram examinations, most                 recent 10/16/2011. COPD; Risk Factors:Hypertension, Diabetes,                 Dyslipidemia and Current Smoker.  Sonographer:    Sherline Distel Senior RDCS Referring Phys: [id number] Ronnell Coins REESE  Sonographer Comments: Technically difficult due to poor echo windows. IMPRESSIONS  1. Left ventricular ejection fraction, by estimation, is 60 to 65%. The left  ventricle has normal function. The left ventricle has no regional wall motion abnormalities. Left ventricular diastolic parameters are consistent with Grade I diastolic dysfunction (impaired relaxation).  2. Right ventricular systolic function is normal. The right ventricular size is normal.  3. The mitral valve is normal in structure. No evidence of mitral valve regurgitation. No evidence of mitral stenosis.  4. The aortic valve was not well visualized. Aortic valve regurgitation is mild. No aortic stenosis is present.  5. The inferior vena cava is normal in size with greater than 50% respiratory variability, suggesting right atrial pressure of 3 mmHg. FINDINGS  Left Ventricle: Left ventricular  ejection fraction, by estimation, is 60 to 65%. The left ventricle has normal function. The left ventricle has no regional wall motion abnormalities. Definity  contrast agent was given IV to delineate the left ventricular  endocardial borders. Strain was performed and the global longitudinal strain is indeterminate. The left ventricular internal cavity size was normal in size. There is no left ventricular hypertrophy. Left ventricular diastolic parameters are consistent with Grade I diastolic dysfunction (impaired relaxation). Right Ventricle: The right ventricular size is normal. No increase in right ventricular wall thickness. Right ventricular systolic function is normal. Left Atrium: Left atrial size was normal in size. Right Atrium: Right atrial size was normal in size. Pericardium: There is no evidence of pericardial effusion. Mitral Valve: The mitral valve is normal in structure. No evidence of mitral valve regurgitation. No evidence of mitral valve stenosis. Tricuspid Valve: The tricuspid valve is normal in structure. Tricuspid valve regurgitation is not demonstrated. No evidence of tricuspid stenosis. Aortic Valve: The aortic valve was not well visualized. Aortic valve regurgitation is mild. No aortic stenosis is  present. Pulmonic Valve: The pulmonic valve was normal in structure. Pulmonic valve regurgitation is not visualized. No evidence of pulmonic stenosis. Aorta: The aortic root is normal in size and structure. Venous: The inferior vena cava is normal in size with greater than 50% respiratory variability, suggesting right atrial pressure of 3 mmHg. IAS/Shunts: The interatrial septum was not well visualized. Additional Comments: 3D was performed not requiring image post processing on an independent workstation and was indeterminate.  LEFT VENTRICLE PLAX 2D LVOT diam:     2.10 cm   Diastology LV SV:         55        LV e' medial:    4.90 cm/s LV SV Index:   27        LV E/e' medial:  9.3 LVOT Area:     3.46 cm  LV e' lateral:   5.33 cm/s                          LV E/e' lateral: 8.5  RIGHT VENTRICLE RV S prime:     11.50 cm/s TAPSE (M-mode): 1.9 cm LEFT ATRIUM             Index        RIGHT ATRIUM           Index LA Vol (A2C):   39.4 ml 19.65 ml/m  RA Area:     14.00 cm LA Vol (A4C):   49.8 ml 24.84 ml/m  RA Volume:   30.00 ml  14.96 ml/m LA Biplane Vol: 46.7 ml 23.29 ml/m  AORTIC VALVE LVOT Vmax:   87.60 cm/s LVOT Vmean:  60.900 cm/s LVOT VTI:    0.158 m  AORTA Ao Root diam: 3.40 cm Ao Asc diam:  3.50 cm MITRAL VALVE MV Area (PHT): 2.50 cm    SHUNTS MV Decel Time: 303 msec    Systemic VTI:  0.16 m MV E velocity: 45.50 cm/s  Systemic Diam: 2.10 cm MV A velocity: 82.10 cm/s MV E/A ratio:  0.55 Janelle Mediate MD Electronically signed by Janelle Mediate MD Signature Date/Time: 01/31/2024/12:48:25 PM    Final    CT Head Wo Contrast Addendum Date: 01/30/2024 ADDENDUM REPORT: 01/30/2024 22:04 ADDENDUM: Findings discussed with Dr. Moses Arenas via telephone at 9:58 p.m. Electronically Signed   By: Stevenson Elbe M.D.   On: 01/30/2024 22:04   Result Date: 01/30/2024  CLINICAL DATA:  Head trauma, minor (Age >= 65y); Neck trauma (Age >= 65y). EXAM: CT HEAD WITHOUT CONTRAST CT CERVICAL SPINE WITHOUT CONTRAST TECHNIQUE: Multidetector  CT imaging of the head and cervical spine was performed following the standard protocol without intravenous contrast. Multiplanar CT image reconstructions of the cervical spine were also generated. RADIATION DOSE REDUCTION: This exam was performed according to the departmental dose-optimization program which includes automated exposure control, adjustment of the mA and/or kV according to patient size and/or use of iterative reconstruction technique. COMPARISON:  None Available. CT head 09/06/2023. FINDINGS: CT HEAD FINDINGS Brain: Acute 9 mm thick right cerebral convexity subdural hemorrhage with hemorrhage measuring up to 6 mm tracking along the right tentorial leaflet. Resulting 3 mm leftward midline shift. Scattered small volume of acute subarachnoid hemorrhage including hemorrhage along the right basal cisterns. No evidence of acute large vascular territory infarct, mass lesion, or hydrocephalus. Vascular: No hyperdense vessel. Skull: No acute fracture. Sinuses/Orbits: Clear sinuses.  No acute orbital findings. CT CERVICAL SPINE FINDINGS Alignment: No substantial sagittal subluxation. Skull base and vertebrae: No acute fracture. Vertebral body heights are maintained. Soft tissues and spinal canal: No prevertebral fluid or swelling. No visible canal hematoma. Disc levels:  Mild bony degenerative change. Upper chest: Visualized lung apices are clear. Emphysema. Aortic atherosclerosis. IMPRESSION: 1. Acute 9 mm thick right cerebral convexity subdural hemorrhage which extends along the right tentorial leaflet. Resulting 3 mm leftward midline shift. 2. Scattered small volume of acute subarachnoid hemorrhage. 3. No evidence of acute fracture or traumatic malalignment in the cervical spine. Electronically Signed: By: Stevenson Elbe M.D. On: 01/30/2024 21:53   CT Cervical Spine Wo Contrast Addendum Date: 01/30/2024 ADDENDUM REPORT: 01/30/2024 22:04 ADDENDUM: Findings discussed with Dr. Moses Arenas via telephone at 9:58  p.m. Electronically Signed   By: Stevenson Elbe M.D.   On: 01/30/2024 22:04   Result Date: 01/30/2024 CLINICAL DATA:  Head trauma, minor (Age >= 65y); Neck trauma (Age >= 65y). EXAM: CT HEAD WITHOUT CONTRAST CT CERVICAL SPINE WITHOUT CONTRAST TECHNIQUE: Multidetector CT imaging of the head and cervical spine was performed following the standard protocol without intravenous contrast. Multiplanar CT image reconstructions of the cervical spine were also generated. RADIATION DOSE REDUCTION: This exam was performed according to the departmental dose-optimization program which includes automated exposure control, adjustment of the mA and/or kV according to patient size and/or use of iterative reconstruction technique. COMPARISON:  None Available. CT head 09/06/2023. FINDINGS: CT HEAD FINDINGS Brain: Acute 9 mm thick right cerebral convexity subdural hemorrhage with hemorrhage measuring up to 6 mm tracking along the right tentorial leaflet. Resulting 3 mm leftward midline shift. Scattered small volume of acute subarachnoid hemorrhage including hemorrhage along the right basal cisterns. No evidence of acute large vascular territory infarct, mass lesion, or hydrocephalus. Vascular: No hyperdense vessel. Skull: No acute fracture. Sinuses/Orbits: Clear sinuses.  No acute orbital findings. CT CERVICAL SPINE FINDINGS Alignment: No substantial sagittal subluxation. Skull base and vertebrae: No acute fracture. Vertebral body heights are maintained. Soft tissues and spinal canal: No prevertebral fluid or swelling. No visible canal hematoma. Disc levels:  Mild bony degenerative change. Upper chest: Visualized lung apices are clear. Emphysema. Aortic atherosclerosis. IMPRESSION: 1. Acute 9 mm thick right cerebral convexity subdural hemorrhage which extends along the right tentorial leaflet. Resulting 3 mm leftward midline shift. 2. Scattered small volume of acute subarachnoid hemorrhage. 3. No evidence of acute fracture or  traumatic malalignment in the cervical spine. Electronically Signed: By: Stevenson Elbe M.D. On: 01/30/2024  21:53   DG Chest 1 View Result Date: 01/30/2024 CLINICAL DATA:  Fall EXAM: CHEST  1 VIEW COMPARISON:  09/06/2023 FINDINGS: Heart and mediastinal contours are within normal limits. No focal opacities or effusions. No acute bony abnormality. IMPRESSION: No active disease. Electronically Signed   By: Janeece Mechanic M.D.   On: 01/30/2024 21:38   DG HIP UNILAT W OR W/O PELVIS 2-3 VIEWS LEFT Result Date: 01/30/2024 CLINICAL DATA:  Fall, pain EXAM: DG HIP (WITH OR WITHOUT PELVIS) 2-3V LEFT COMPARISON:  None Available. FINDINGS: There is no evidence of hip fracture or dislocation. There is no evidence of arthropathy or other focal bone abnormality. IMPRESSION: Negative. Electronically Signed   By: Janeece Mechanic M.D.   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 No acute fracture, subluxation or dislocation. Early degenerative changes in the [facility] and glenohumeral joints. 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 No acute bony abnormality. Electronically Signed   By: Janeece Mechanic M.D.   On: 01/30/2024 21:35    Microbiology: Results for orders placed or performed during the hospital encounter of 01/30/24  Resp panel by RT-PCR (RSV, Flu A&B, Covid) Anterior Nasal Swab     Status: None   Collection Time: 01/30/24  8:10 PM   Specimen: Anterior Nasal Swab  Result Value Ref Range Status   SARS Coronavirus 2 by RT PCR NEGATIVE NEGATIVE Final   Influenza A by PCR NEGATIVE NEGATIVE Final   Influenza B by PCR NEGATIVE NEGATIVE Final    Comment: (NOTE) The Xpert Xpress SARS-CoV-2/FLU/RSV plus assay is intended as an aid in the diagnosis of influenza from Nasopharyngeal swab  specimens and should not be used as a sole basis for treatment. Nasal washings and aspirates are unacceptable for Xpert Xpress SARS-CoV-2/FLU/RSV testing.  Fact Sheet for Patients: BloggerCourse.com  Fact Sheet for Healthcare Providers: SeriousBroker.it  This test is not yet approved or cleared by the [place]  FDA and has been authorized for detection and/or diagnosis of SARS-CoV-2 by FDA under an Emergency Use Authorization (EUA). This EUA will remain in effect (meaning this test can be used) for the duration of the COVID-19 declaration under Section 564(b)(1) of the Act, 21 U.S.C. section 360bbb-3(b)(1), unless the authorization is terminated or revoked.     Resp Syncytial Virus by PCR NEGATIVE NEGATIVE Final    Comment: (NOTE) Fact Sheet for Patients: BloggerCourse.com  Fact Sheet for Healthcare Providers: SeriousBroker.it  This test is not yet approved or cleared by the [place]  FDA and has been authorized for detection and/or diagnosis of SARS-CoV-2 by FDA under an Emergency Use Authorization (EUA). This EUA will remain in effect (meaning this test can be used) for the duration of the COVID-19 declaration under Section 564(b)(1) of the Act, 21 U.S.C. section 360bbb-3(b)(1), unless the authorization is terminated or revoked.  Performed at [facility], [address] [place], [place] [address]   Blood culture (routine x 2)     Status: None   Collection Time: 01/30/24  8:32 PM   Specimen: BLOOD RIGHT FOREARM  Result Value Ref Range Status   Specimen Description BLOOD RIGHT FOREARM  Final   Special Requests   Final    BOTTLES DRAWN AEROBIC AND ANAEROBIC Blood Culture results may not be optimal due to an inadequate volume  of blood received in culture bottles   Culture   Final    NO GROWTH 5 DAYS Performed at [facility], [address]  [place], [place] [address]    Report Status 02/04/2024 FINAL  Final  Blood culture (routine x 2)     Status: None   Collection Time: 01/30/24  8:40 PM   Specimen: BLOOD  Result Value Ref Range Status   Specimen Description BLOOD RIGHT ANTECUBITAL  Final   Special Requests   Final    BOTTLES DRAWN AEROBIC AND ANAEROBIC Blood Culture adequate volume   Culture   Final    NO GROWTH 5 DAYS Performed at [facility], [address], [place], [place] [address]    Report Status 02/04/2024 FINAL  Final  MRSA Next Gen by PCR, Nasal     Status: None   Collection Time: 01/31/24  1:03 AM   Specimen: Urine, Clean Catch; Nasal Swab  Result Value Ref Range Status   MRSA by PCR Next Gen NOT DETECTED NOT DETECTED Final    Comment: (NOTE) The GeneXpert MRSA Assay (FDA approved for NASAL specimens only), is one component of a comprehensive MRSA colonization surveillance program. It is not intended to diagnose MRSA infection nor to guide or monitor treatment for MRSA infections. Test performance is not FDA approved in patients less than 50 years old. Performed at [facility], [address], [place], [place] [address]   Urine Culture     Status: Abnormal   Collection Time: 02/03/24  8:22 AM   Specimen: Urine, Random  Result Value Ref Range Status   Specimen Description URINE, RANDOM  Final   Special Requests   Final    NONE Reflexed from [id number] Performed at [facility], [address], [place], [place] [address]    Culture >=100,000 COLONIES/mL ESCHERICHIA COLI (A)  Final   Report Status 02/05/2024 FINAL  Final   Organism ID, Bacteria ESCHERICHIA COLI (A)  Final      Susceptibility   Escherichia coli - MIC*    AMPICILLIN <=2 SENSITIVE Sensitive     CEFAZOLIN  <=4 SENSITIVE Sensitive     CEFEPIME <=0.12 SENSITIVE Sensitive     CEFTRIAXONE  <=0.25 SENSITIVE Sensitive     CIPROFLOXACIN  <=0.25 SENSITIVE Sensitive     GENTAMICIN <=1 SENSITIVE Sensitive     IMIPENEM <=0.25 SENSITIVE  Sensitive     NITROFURANTOIN <=16 SENSITIVE Sensitive     TRIMETH /SULFA  <=20 SENSITIVE Sensitive     AMPICILLIN/SULBACTAM <=2 SENSITIVE Sensitive     PIP/TAZO <=4 SENSITIVE Sensitive ug/mL    * >=100,000 COLONIES/mL ESCHERICHIA COLI  Culture, blood (Routine X 2) w Reflex to ID Panel     Status: None   Collection Time: 02/09/24  2:51 PM   Specimen: BLOOD LEFT HAND  Result Value Ref Range Status   Specimen Description BLOOD LEFT HAND  Final   Special Requests   Final    BOTTLES DRAWN AEROBIC AND ANAEROBIC Blood Culture adequate volume   Culture   Final    NO GROWTH 5 DAYS Performed at [facility], [address], [place], [place] [address]    Report Status 02/14/2024 FINAL  Final  Culture, blood (Routine X 2) w Reflex to ID Panel     Status: None   Collection Time: 02/09/24  2:51 PM   Specimen: BLOOD LEFT ARM  Result Value Ref Range Status   Specimen Description BLOOD LEFT ARM  Final  Special Requests   Final    BOTTLES DRAWN AEROBIC AND ANAEROBIC Blood Culture adequate volume   Culture   Final    NO GROWTH 5 DAYS Performed at [facility], [address] [place], [place] [address]    Report Status 02/14/2024 FINAL  Final  Culture, blood (Routine X 2) w Reflex to ID Panel     Status: None   Collection Time: 02/12/24 11:16 AM   Specimen: BLOOD LEFT HAND  Result Value Ref Range Status   Specimen Description BLOOD LEFT HAND  Final   Special Requests   Final    BOTTLES DRAWN AEROBIC AND ANAEROBIC Blood Culture results may not be optimal due to an inadequate volume of blood received in culture bottles   Culture   Final    NO GROWTH 5 DAYS Performed at [facility], [address] [place], [place] [address]    Report Status 02/17/2024 FINAL  Final  Culture, blood (Routine X 2) w Reflex to ID Panel     Status: None   Collection Time: 02/12/24 11:18 AM   Specimen: BLOOD LEFT HAND  Result Value Ref Range Status   Specimen Description BLOOD LEFT HAND  Final    Special Requests   Final    BOTTLES DRAWN AEROBIC AND ANAEROBIC Blood Culture adequate volume   Culture   Final    NO GROWTH 5 DAYS Performed at [facility], [address] [place], [place] [address]    Report Status 02/17/2024 FINAL  Final  Respiratory (~20 pathogens) panel by PCR     Status: None   Collection Time: 02/14/24  2:32 PM   Specimen: Nasopharyngeal Swab; Respiratory  Result Value Ref Range Status   Adenovirus NOT DETECTED NOT DETECTED Final   Coronavirus 229E NOT DETECTED NOT DETECTED Final    Comment: (NOTE) The Coronavirus on the Respiratory Panel, DOES NOT test for the novel  Coronavirus (2019 nCoV)    Coronavirus HKU1 NOT DETECTED NOT DETECTED Final   Coronavirus NL63 NOT DETECTED NOT DETECTED Final   Coronavirus OC43 NOT DETECTED NOT DETECTED Final   Metapneumovirus NOT DETECTED NOT DETECTED Final   Rhinovirus / Enterovirus NOT DETECTED NOT DETECTED Final   Influenza A NOT DETECTED NOT DETECTED Final   Influenza B NOT DETECTED NOT DETECTED Final   Parainfluenza Virus 1 NOT DETECTED NOT DETECTED Final   Parainfluenza Virus 2 NOT DETECTED NOT DETECTED Final   Parainfluenza Virus 3 NOT DETECTED NOT DETECTED Final   Parainfluenza Virus 4 NOT DETECTED NOT DETECTED Final   Respiratory Syncytial Virus NOT DETECTED NOT DETECTED Final   Bordetella pertussis NOT DETECTED NOT DETECTED Final   Bordetella Parapertussis NOT DETECTED NOT DETECTED Final   Chlamydophila pneumoniae NOT DETECTED NOT DETECTED Final   Mycoplasma pneumoniae NOT DETECTED NOT DETECTED Final    Comment: Performed at [facility], [address] [place], [place] [address]    Labs: CBC: Recent Labs  Lab 02/21/24 1120 02/22/24 1033 02/23/24 0811  WBC 5.8 5.7 5.8  HGB 9.3* 9.6* 9.9*  HCT 27.9* 29.4* 30.3*  MCV 97.9 100.0 98.4  PLT 282 298 317   Basic Metabolic Panel: Recent Labs  Lab 02/21/24 1120 02/22/24 1033 02/23/24 0811  NA 135 136 136  K 3.8 3.7 3.8  CL 104 105 103   CO2 26 20* 24  GLUCOSE 122* 138* 128*  BUN 21 23 19   CREATININE 0.66 0.55* 0.60*  CALCIUM  8.9 9.0 9.2  MG 2.1  --   --    Liver Function Tests: Recent Labs  Lab 02/21/24 1120  AST 28  ALT 43  ALKPHOS 93  BILITOT 0.8  PROT 7.5  ALBUMIN 3.3*   CBG: Recent Labs  Lab 02/23/24 1224 02/23/24 1616 02/23/24 2116 02/24/24 0617 02/24/24 1233  GLUCAP 123* 163* 115* 139* 110*    Discharge time spent: greater than 30 minutes.  Signed: Danette Duos, MD [facility] 02/24/2024

## 2024-02-24 NOTE — TOC Transition Note (Signed)
 Transition of Care [facility]) - Discharge Note   Patient Details  Name: [name] MRN: [id number] Date of Birth: 03-07-1961  [facility] CM/SW Contact:  Tandy Fam, LCSW Phone Number: 02/24/2024, 2:55 PM   Clinical Narrative:   CSW updated by [name] that patient's authorization has been approved, they can admit today. CSW updated spouse, [name], via phone and she is in agreement. CSW sent discharge information to [facility] and confirmed receipt. Transport arranged with [facility] for next available.  Nurse to call report to [phone], Room 146.    Final next level of care: Skilled Nursing Facility Barriers to Discharge: Barriers Resolved   Patient Goals and CMS Choice            Discharge Placement              Patient chooses bed at:  [facility]) Patient to be transferred to facility by: [facility] Name of family member notified: [name] Patient and family notified of of transfer: 02/24/24  Discharge Plan and Services Additional resources added to the After Visit Summary for   In-house Referral: Clinical Social Work   Post Acute Care Choice: Skilled Nursing Facility                               Social Drivers of Health (SDOH) Interventions SDOH Screenings   Food Insecurity: Patient Unable To Answer (02/02/2024)  Housing: Patient Unable To Answer (02/02/2024)  Transportation Needs: Patient Unable To Answer (02/02/2024)  Utilities: Patient Unable To Answer (02/02/2024)  Financial Resource Strain: Low Risk  (12/31/2022)   Received from [facility]  Physical Activity: Unknown (03/27/2023)   Received from [facility]  Social Connections: Somewhat Isolated (03/27/2023)   Received from [facility]  Stress: Stress Concern Present (03/27/2023)   Received from [facility]  Tobacco Use: High Risk (02/18/2024)     Readmission Risk Interventions     No data to display

## 2024-02-24 NOTE — Progress Notes (Signed)
 Physical Therapy Treatment Patient Details Name: [name] MRN: [id number] DOB: Apr 14, 1961 Today's Date: 02/24/2024   History of Present Illness Pt is a 63 y.o. male presenting 5/8 after a fall at home. UDS positive for benzodiazepines. EKG with sinus bradycardia. Head CT 5/8 showed 9 mm right subdural hematoma with 3 mm midline shift and small volume acute SAH. Pt on continuous EEG 5/14 with continued seizures. 5/13 CT head: Resolved left SDH, increased right SDH-11 mm. Small volume SAH. 5/27 pt with declining mental status and repeat head CT with progressed and now mixed density right SDH 14mm thickness with incr intracranial mass effect and L midline shift 9-41mm. S/p R sided burr holes and placement of subdural train for hamatoma evacuation 5/27. PMH significant for seizures and COPD, smoker.    PT Comments  Pt with fair tolerance to treatment today. Pt today was able to stand EOB with +2 Min A RW and perform standing marches however declined further mobility due to dizziness. Once seated pt able to perform seated therex. No change in DC/DME recs at this time. PT will continue to follow.     If plan is discharge home, recommend the following: Two people to help with walking and/or transfers;Assist for transportation;Assistance with cooking/housework;Help with stairs or ramp for entrance;Supervision due to cognitive status   Can travel by private vehicle     No  Equipment Recommendations  Wheelchair (measurements PT);Wheelchair cushion (measurements PT);Hoyer lift    Recommendations for Other Services       Precautions / Restrictions Precautions Precautions: Fall Recall of Precautions/Restrictions: Impaired Restrictions Weight Bearing Restrictions Per Provider Order: No     Mobility  Bed Mobility Overal bed mobility: Needs Assistance Bed Mobility: Supine to Sit, Sit to Supine     Supine to sit: Min assist, +2 for physical assistance Sit to supine: Supervision   General bed  mobility comments: +2 Min A to scoot hips forward. Verbal and tactile cues for sequencing. Supervision to return to bed.    Transfers Overall transfer level: Needs assistance Equipment used: Rolling walker (2 wheels) Transfers: Sit to/from Stand Sit to Stand: +2 physical assistance, Min assist           General transfer comment: Verbal cues for hand placement and sequencing. Pt noted with heavy posterior lean upon standing and reporting dizziness.    Ambulation/Gait             Pre-gait activities: Standing marches General Gait Details: Deferred due to dizziness   Stairs             Wheelchair Mobility     Tilt Bed    Modified Rankin (Stroke Patients Only) Modified Rankin (Stroke Patients Only) Pre-Morbid Rankin Score: Moderate disability Modified Rankin: Severe disability     Balance Overall balance assessment: Needs assistance Sitting-balance support: Feet supported, Single extremity supported Sitting balance-Leahy Scale: Fair     Standing balance support: Bilateral upper extremity supported Standing balance-Leahy Scale: Poor                              Communication Communication Communication: Impaired  Cognition Arousal: Alert Behavior During Therapy: Flat affect                           PT - Cognition Comments: Pt spouse present and encouraged patient for majority. Pt mostly with flat affect and appeared to be quite grumpy. Following commands:  Impaired Following commands impaired: Follows one step commands with increased time    Cueing Cueing Techniques: Verbal cues, Tactile cues, Gestural cues  Exercises General Exercises - Lower Extremity Long Arc Quad: AROM, Both, 10 reps, Seated Hip Flexion/Marching: 10 reps, Both, AROM    General Comments General comments (skin integrity, edema, etc.): VSS      Pertinent Vitals/Pain Pain Assessment Pain Assessment: No/denies pain    Home Living                           Prior Function            PT Goals (current goals can now be found in the care plan section) Progress towards PT goals: Progressing toward goals    Frequency    Min 2X/week      PT Plan      Co-evaluation              AM-PAC PT "6 Clicks" Mobility   Outcome Measure  Help needed turning from your back to your side while in a flat bed without using bedrails?: A Lot Help needed moving from lying on your back to sitting on the side of a flat bed without using bedrails?: A Lot Help needed moving to and from a bed to a chair (including a wheelchair)?: Total Help needed standing up from a chair using your arms (e.g., wheelchair or bedside chair)?: Total Help needed to walk in hospital room?: Total Help needed climbing 3-5 steps with a railing? : Total 6 Click Score: 8    End of Session Equipment Utilized During Treatment: Gait belt Activity Tolerance: Patient tolerated treatment well Patient left: in bed;with call bell/phone within reach;with family/visitor present;with bed alarm set;with SCD's reapplied Nurse Communication: Mobility status PT Visit Diagnosis: Unsteadiness on feet (R26.81);Muscle weakness (generalized) (M62.81);History of falling (Z91.81);Difficulty in walking, not elsewhere classified (R26.2);Other symptoms and signs involving the nervous system (R29.898)     Time: 1610-9604 PT Time Calculation (min) (ACUTE ONLY): 15 min  Charges:    $Therapeutic Activity: 8-22 mins PT General Charges $$ ACUTE PT VISIT: 1 Visit                     Rodgers Clack, PT, DPT Acute Rehab Services [phone]    [name] 02/24/2024, 12:33 PM

## 2024-02-24 NOTE — Progress Notes (Signed)
 Report given to Nurse precious At [facility].

## 2024-02-24 NOTE — Progress Notes (Signed)
 Attempted tp change linen and patient gown, patient refused stated all he wanted was to be left alone in peace.

## 2024-02-24 NOTE — Progress Notes (Signed)
 Patient wheeled off unit by PTAR IV removed and patient sent to facility with grey jacket his wife left behind.

## 2024-02-28 ENCOUNTER — Inpatient Hospital Stay (HOSPITAL_COMMUNITY)
Admission: EM | Admit: 2024-02-28 | Discharge: 2024-03-08 | DRG: 393 | Disposition: A | Discharge status: Home Health | Payer: Medicare (Managed Care) | Source: Skilled Nursing Facility | Attending: Family Medicine | Admitting: Family Medicine

## 2024-02-28 ENCOUNTER — Emergency Department (HOSPITAL_COMMUNITY): Payer: Medicare (Managed Care)

## 2024-02-28 ENCOUNTER — Other Ambulatory Visit: Payer: Self-pay

## 2024-02-28 DIAGNOSIS — F419 Anxiety disorder, unspecified: Secondary | ICD-10-CM | POA: Diagnosis present

## 2024-02-28 DIAGNOSIS — I82621 Acute embolism and thrombosis of deep veins of right upper extremity: Secondary | ICD-10-CM | POA: Diagnosis not present

## 2024-02-28 DIAGNOSIS — Z7901 Long term (current) use of anticoagulants: Secondary | ICD-10-CM

## 2024-02-28 DIAGNOSIS — Z7984 Long term (current) use of oral hypoglycemic drugs: Secondary | ICD-10-CM

## 2024-02-28 DIAGNOSIS — Z79899 Other long term (current) drug therapy: Secondary | ICD-10-CM | POA: Diagnosis not present

## 2024-02-28 DIAGNOSIS — K529 Noninfective gastroenteritis and colitis, unspecified: Secondary | ICD-10-CM | POA: Diagnosis present

## 2024-02-28 DIAGNOSIS — K559 Vascular disorder of intestine, unspecified: Secondary | ICD-10-CM | POA: Diagnosis not present

## 2024-02-28 DIAGNOSIS — G40909 Epilepsy, unspecified, not intractable, without status epilepticus: Secondary | ICD-10-CM | POA: Diagnosis present

## 2024-02-28 DIAGNOSIS — J439 Emphysema, unspecified: Secondary | ICD-10-CM | POA: Diagnosis present

## 2024-02-28 DIAGNOSIS — K219 Gastro-esophageal reflux disease without esophagitis: Secondary | ICD-10-CM | POA: Diagnosis present

## 2024-02-28 DIAGNOSIS — S066XAD Traumatic subarachnoid hemorrhage with loss of consciousness status unknown, subsequent encounter: Secondary | ICD-10-CM

## 2024-02-28 DIAGNOSIS — F17290 Nicotine dependence, other tobacco product, uncomplicated: Secondary | ICD-10-CM | POA: Diagnosis present

## 2024-02-28 DIAGNOSIS — K55039 Acute (reversible) ischemia of large intestine, extent unspecified: Secondary | ICD-10-CM | POA: Diagnosis present

## 2024-02-28 DIAGNOSIS — Z880 Allergy status to penicillin: Secondary | ICD-10-CM

## 2024-02-28 DIAGNOSIS — Z8349 Family history of other endocrine, nutritional and metabolic diseases: Secondary | ICD-10-CM

## 2024-02-28 DIAGNOSIS — Z823 Family history of stroke: Secondary | ICD-10-CM | POA: Diagnosis not present

## 2024-02-28 DIAGNOSIS — E785 Hyperlipidemia, unspecified: Secondary | ICD-10-CM | POA: Diagnosis present

## 2024-02-28 DIAGNOSIS — J9611 Chronic respiratory failure with hypoxia: Secondary | ICD-10-CM | POA: Diagnosis present

## 2024-02-28 DIAGNOSIS — Z9981 Dependence on supplemental oxygen: Secondary | ICD-10-CM | POA: Diagnosis not present

## 2024-02-28 DIAGNOSIS — F32A Depression, unspecified: Secondary | ICD-10-CM | POA: Diagnosis present

## 2024-02-28 DIAGNOSIS — K5900 Constipation, unspecified: Secondary | ICD-10-CM | POA: Diagnosis present

## 2024-02-28 DIAGNOSIS — Z79890 Hormone replacement therapy (postmenopausal): Secondary | ICD-10-CM | POA: Diagnosis not present

## 2024-02-28 DIAGNOSIS — F17210 Nicotine dependence, cigarettes, uncomplicated: Secondary | ICD-10-CM | POA: Diagnosis present

## 2024-02-28 DIAGNOSIS — Z860100 Personal history of colon polyps, unspecified: Secondary | ICD-10-CM

## 2024-02-28 DIAGNOSIS — S065XAA Traumatic subdural hemorrhage with loss of consciousness status unknown, initial encounter: Secondary | ICD-10-CM | POA: Diagnosis present

## 2024-02-28 DIAGNOSIS — D6832 Hemorrhagic disorder due to extrinsic circulating anticoagulants: Secondary | ICD-10-CM | POA: Diagnosis present

## 2024-02-28 DIAGNOSIS — W19XXXD Unspecified fall, subsequent encounter: Secondary | ICD-10-CM | POA: Diagnosis present

## 2024-02-28 DIAGNOSIS — E876 Hypokalemia: Secondary | ICD-10-CM | POA: Diagnosis present

## 2024-02-28 DIAGNOSIS — R109 Unspecified abdominal pain: Secondary | ICD-10-CM | POA: Diagnosis not present

## 2024-02-28 DIAGNOSIS — I82403 Acute embolism and thrombosis of unspecified deep veins of lower extremity, bilateral: Secondary | ICD-10-CM | POA: Diagnosis not present

## 2024-02-28 DIAGNOSIS — K625 Hemorrhage of anus and rectum: Secondary | ICD-10-CM | POA: Diagnosis present

## 2024-02-28 DIAGNOSIS — S065XAD Traumatic subdural hemorrhage with loss of consciousness status unknown, subsequent encounter: Secondary | ICD-10-CM

## 2024-02-28 DIAGNOSIS — Z8249 Family history of ischemic heart disease and other diseases of the circulatory system: Secondary | ICD-10-CM

## 2024-02-28 DIAGNOSIS — E039 Hypothyroidism, unspecified: Secondary | ICD-10-CM | POA: Diagnosis present

## 2024-02-28 DIAGNOSIS — Z86718 Personal history of other venous thrombosis and embolism: Secondary | ICD-10-CM

## 2024-02-28 DIAGNOSIS — K922 Gastrointestinal hemorrhage, unspecified: Secondary | ICD-10-CM | POA: Diagnosis not present

## 2024-02-28 DIAGNOSIS — E119 Type 2 diabetes mellitus without complications: Secondary | ICD-10-CM | POA: Diagnosis present

## 2024-02-28 DIAGNOSIS — J449 Chronic obstructive pulmonary disease, unspecified: Secondary | ICD-10-CM | POA: Diagnosis present

## 2024-02-28 DIAGNOSIS — I10 Essential (primary) hypertension: Secondary | ICD-10-CM | POA: Diagnosis present

## 2024-02-28 DIAGNOSIS — D72829 Elevated white blood cell count, unspecified: Secondary | ICD-10-CM | POA: Diagnosis not present

## 2024-02-28 DIAGNOSIS — Z888 Allergy status to other drugs, medicaments and biological substances status: Secondary | ICD-10-CM

## 2024-02-28 LAB — CBC WITH DIFFERENTIAL/PLATELET
Abs Immature Granulocytes: 0.06 10*3/uL (ref 0.00–0.07)
Basophils Absolute: 0 10*3/uL (ref 0.0–0.1)
Basophils Relative: 0 %
Eosinophils Absolute: 0 10*3/uL (ref 0.0–0.5)
Eosinophils Relative: 0 %
HCT: 36.3 % — ABNORMAL LOW (ref 39.0–52.0)
Hemoglobin: 12.2 g/dL — ABNORMAL LOW (ref 13.0–17.0)
Immature Granulocytes: 0 %
Lymphocytes Relative: 9 %
Lymphs Abs: 1.2 10*3/uL (ref 0.7–4.0)
MCH: 34.1 pg — ABNORMAL HIGH (ref 26.0–34.0)
MCHC: 33.6 g/dL (ref 30.0–36.0)
MCV: 101.4 fL — ABNORMAL HIGH (ref 80.0–100.0)
Monocytes Absolute: 1.2 10*3/uL — ABNORMAL HIGH (ref 0.1–1.0)
Monocytes Relative: 9 %
Neutro Abs: 11.2 10*3/uL — ABNORMAL HIGH (ref 1.7–7.7)
Neutrophils Relative %: 82 %
Platelets: 286 10*3/uL (ref 150–400)
RBC: 3.58 MIL/uL — ABNORMAL LOW (ref 4.22–5.81)
RDW: 18 % — ABNORMAL HIGH (ref 11.5–15.5)
WBC: 13.7 10*3/uL — ABNORMAL HIGH (ref 4.0–10.5)
nRBC: 0 % (ref 0.0–0.2)

## 2024-02-28 LAB — COMPREHENSIVE METABOLIC PANEL WITH GFR
ALT: 55 U/L — ABNORMAL HIGH (ref 0–44)
AST: 37 U/L (ref 15–41)
Albumin: 3.6 g/dL (ref 3.5–5.0)
Alkaline Phosphatase: 97 U/L (ref 38–126)
Anion gap: 15 (ref 5–15)
BUN: 23 mg/dL (ref 8–23)
CO2: 21 mmol/L — ABNORMAL LOW (ref 22–32)
Calcium: 9.6 mg/dL (ref 8.9–10.3)
Chloride: 101 mmol/L (ref 98–111)
Creatinine, Ser: 0.6 mg/dL — ABNORMAL LOW (ref 0.61–1.24)
GFR, Estimated: >60 mL/min (ref >60)
Glucose, Bld: 149 mg/dL — ABNORMAL HIGH (ref 70–99)
Potassium: 3.4 mmol/L — ABNORMAL LOW (ref 3.5–5.1)
Sodium: 137 mmol/L (ref 135–145)
Total Bilirubin: 0.9 mg/dL (ref 0.0–1.2)
Total Protein: 7.4 g/dL (ref 6.5–8.1)

## 2024-02-28 LAB — POC OCCULT BLOOD, ED: Fecal Occult Bld: POSITIVE — AB

## 2024-02-28 LAB — TYPE AND SCREEN
ABO/RH(D): A NEG
Antibody Screen: NEGATIVE

## 2024-02-28 LAB — PROTIME-INR
INR: 1.1 (ref 0.8–1.2)
Prothrombin Time: 14.4 s (ref 11.4–15.2)

## 2024-02-28 MED ORDER — ONDANSETRON HCL 4 MG PO TABS
4.0000 mg | ORAL_TABLET | Freq: Four times a day (QID) | ORAL | Status: DC | PRN
  Administered 2024-03-01: 4 mg via ORAL
  Filled 2024-02-28: qty 1

## 2024-02-28 MED ORDER — SODIUM CHLORIDE 0.9 % IV BOLUS
1000.0000 mL | Freq: Once | INTRAVENOUS | Status: AC
  Administered 2024-02-28: 1000 mL via INTRAVENOUS

## 2024-02-28 MED ORDER — ACETAMINOPHEN 650 MG RE SUPP: 650.0000 mg | Freq: Four times a day (QID) | RECTAL | Status: DC | PRN

## 2024-02-28 MED ORDER — IOHEXOL 350 MG/ML SOLN
70.0000 mL | Freq: Once | INTRAVENOUS | Status: AC | PRN
  Administered 2024-02-28: 70 mL via INTRAVENOUS

## 2024-02-28 MED ORDER — OXYCODONE HCL 5 MG PO TABS
5.0000 mg | ORAL_TABLET | Freq: Once | ORAL | Status: AC
  Administered 2024-02-28: 5 mg via ORAL
  Filled 2024-02-28: qty 1

## 2024-02-28 MED ORDER — LEVOTHYROXINE SODIUM 75 MCG PO TABS
150.0000 ug | ORAL_TABLET | Freq: Every day | ORAL | Status: DC
  Administered 2024-02-29 – 2024-03-08 (×9): 150 ug via ORAL
  Filled 2024-02-28 (×10): qty 2

## 2024-02-28 MED ORDER — UMECLIDINIUM BROMIDE 62.5 MCG/ACT IN AEPB
1.0000 | INHALATION_SPRAY | Freq: Every day | RESPIRATORY_TRACT | Status: DC
  Administered 2024-03-03 – 2024-03-08 (×6): 1 via RESPIRATORY_TRACT
  Filled 2024-02-28 (×2): qty 7

## 2024-02-28 MED ORDER — SODIUM CHLORIDE 0.9 % IV SOLN
2.0000 g | INTRAVENOUS | Status: DC
  Administered 2024-02-28 – 2024-03-02 (×3): 2 g via INTRAVENOUS
  Filled 2024-02-28 (×3): qty 20

## 2024-02-28 MED ORDER — SODIUM CHLORIDE 0.9 % IV SOLN
INTRAVENOUS | Status: AC
  Administered 2024-02-28: 100 mL via INTRAVENOUS

## 2024-02-28 MED ORDER — ONDANSETRON HCL 4 MG/2ML IJ SOLN
4.0000 mg | Freq: Four times a day (QID) | INTRAMUSCULAR | Status: DC | PRN
Start: 2024-02-28 — End: 2024-03-08
  Administered 2024-03-03: 4 mg via INTRAVENOUS
  Filled 2024-02-28: qty 2

## 2024-02-28 MED ORDER — ONDANSETRON HCL 4 MG/2ML IJ SOLN
4.0000 mg | Freq: Once | INTRAMUSCULAR | Status: AC
  Administered 2024-02-28: 4 mg via INTRAVENOUS
  Filled 2024-02-28: qty 2

## 2024-02-28 MED ORDER — METRONIDAZOLE 500 MG/100ML IV SOLN
500.0000 mg | Freq: Two times a day (BID) | INTRAVENOUS | Status: DC
  Administered 2024-02-28 – 2024-03-02 (×6): 500 mg via INTRAVENOUS
  Filled 2024-02-28 (×6): qty 100

## 2024-02-28 MED ORDER — TIOTROPIUM BROMIDE MONOHYDRATE 2.5 MCG/ACT IN AERS: 2.0000 | INHALATION_SPRAY | Freq: Every day | RESPIRATORY_TRACT | Status: DC

## 2024-02-28 MED ORDER — SODIUM CHLORIDE 0.9% FLUSH
3.0000 mL | Freq: Two times a day (BID) | INTRAVENOUS | Status: DC
  Administered 2024-03-01 – 2024-03-06 (×9): 3 mL via INTRAVENOUS

## 2024-02-28 MED ORDER — PANTOPRAZOLE SODIUM 40 MG IV SOLR
40.0000 mg | Freq: Once | INTRAVENOUS | Status: AC
  Administered 2024-02-28: 40 mg via INTRAVENOUS
  Filled 2024-02-28: qty 10

## 2024-02-28 MED ORDER — METOPROLOL TARTRATE 25 MG PO TABS
25.0000 mg | ORAL_TABLET | Freq: Two times a day (BID) | ORAL | Status: DC
  Administered 2024-02-29 – 2024-03-03 (×7): 25 mg via ORAL
  Filled 2024-02-28 (×8): qty 1

## 2024-02-28 MED ORDER — ALBUTEROL SULFATE (2.5 MG/3ML) 0.083% IN NEBU: 2.5000 mg | INHALATION_SOLUTION | RESPIRATORY_TRACT | Status: DC | PRN

## 2024-02-28 MED ORDER — ALPRAZOLAM 0.5 MG PO TABS
0.5000 mg | ORAL_TABLET | Freq: Two times a day (BID) | ORAL | Status: DC
  Administered 2024-02-29 – 2024-03-08 (×16): 0.5 mg via ORAL
  Filled 2024-02-28 (×9): qty 1
  Filled 2024-02-28: qty 2
  Filled 2024-02-28 (×8): qty 1

## 2024-02-28 MED ORDER — ACETAMINOPHEN 325 MG PO TABS
650.0000 mg | ORAL_TABLET | Freq: Four times a day (QID) | ORAL | Status: DC | PRN
  Administered 2024-03-03 (×2): 650 mg via ORAL
  Filled 2024-02-28 (×3): qty 2

## 2024-02-28 MED ORDER — CITALOPRAM HYDROBROMIDE 20 MG PO TABS
20.0000 mg | ORAL_TABLET | Freq: Every day | ORAL | Status: DC
  Administered 2024-02-29 – 2024-03-08 (×9): 20 mg via ORAL
  Filled 2024-02-28 (×8): qty 1
  Filled 2024-02-28: qty 2
  Filled 2024-02-28: qty 1

## 2024-02-28 MED ORDER — PHENOBARBITAL 32.4 MG PO TABS
64.8000 mg | ORAL_TABLET | Freq: Two times a day (BID) | ORAL | Status: DC
  Administered 2024-02-29 – 2024-03-08 (×17): 64.8 mg via ORAL
  Filled 2024-02-28 (×18): qty 2

## 2024-02-28 MED ORDER — LACOSAMIDE 200 MG PO TABS
200.0000 mg | ORAL_TABLET | Freq: Two times a day (BID) | ORAL | Status: DC
  Administered 2024-02-29 – 2024-03-08 (×17): 200 mg via ORAL
  Filled 2024-02-28 (×14): qty 1
  Filled 2024-02-28: qty 4
  Filled 2024-02-28 (×3): qty 1

## 2024-02-28 MED ORDER — SODIUM CHLORIDE 0.9 % IV SOLN
INTRAVENOUS | Status: DC
  Administered 2024-02-28: 125 mL via INTRAVENOUS

## 2024-02-28 MED ORDER — PANTOPRAZOLE SODIUM 40 MG PO TBEC: 40.0000 mg | DELAYED_RELEASE_TABLET | Freq: Every day | ORAL | Status: DC

## 2024-02-28 MED ORDER — POTASSIUM CHLORIDE 20 MEQ PO PACK: 40.0000 meq | PACK | Freq: Once | ORAL | Status: DC

## 2024-02-28 NOTE — ED Triage Notes (Signed)
 Pt to ED from [facility] via EMS c/o h/a x 2 weeks, rectal pain & 1 episode of some bright red blood after constipation of multiple days.  Note: S/p craniotomy 2 weeks ago & d/c'd 4 days ago.   BP 132/74.  HR 88. RR 16. 97%RA.  BG 156.

## 2024-02-28 NOTE — ED Provider Notes (Signed)
 [facility] Provider Note   CSN: [id number] Arrival date & time: 02/28/24  1420     History Brain bleed, Seizures, diabetes  Chief Complaint  Patient presents with   Rectal Bleeding   Rectal Pain    [name] is a 64 y.o. male.  63 year old male with a past medical history of craniotomy status post brain bleed, seizures, diabetes presents to the ED via EMS from [facility] with a chief complaint of rectal bleeding times today.  Wife providing most of the history at the bedside, she reports that she was feeding patient this evening when suddenly patient was complaining of his abdomen hurting him, then she reports he soaked an entire diaper with bright red blood.  He was previously on a blood thinner, states that now he is in a "small dose of a blood thinner ", since the craniotomy occurred.  However no blood thinner has been noted on his chart.  He endorses pain along the left lower quadrant of his abdomen.  His last oral intake was this evening. No prior episodes of these. No fever, no nausea or vomiting but does endorse some mild head pain.   The history is provided by the patient.  Rectal Bleeding Quality:  Bright red Amount:  Moderate Duration:  5 hours Timing:  Constant Chronicity:  Recurrent Context: constipation   Similar prior episodes: no   Relieved by:  Nothing Worsened by:  Defecation Ineffective treatments:  None tried Associated symptoms: abdominal pain   Associated symptoms: no fever and no vomiting        Home Medications Prior to Admission medications   Medication Sig Start Date End Date Taking? Authorizing Provider  acetaminophen  (TYLENOL ) 500 MG tablet Take 1 tablet (500 mg total) by mouth 3 (three) times daily. 02/24/24  Yes Regalado, Belkys A, MD  albuterol  (PROVENTIL  HFA;VENTOLIN  HFA) 108 (90 BASE) MCG/ACT inhaler Inhale 2 puffs into the lungs every 4 (four) hours as needed for wheezing or shortness of  breath.   Yes [provider]  ALPRAZolam  (XANAX ) 0.5 MG tablet Take 1 tablet (0.5 mg total) by mouth 2 (two) times daily. 02/24/24  Yes Regalado, Belkys A, MD  Ascorbic Acid (VITAMIN C PO) Take 1 tablet by mouth daily.   Yes [provider]  Calcium  Carb-Cholecalciferol (CALCIUM  HIGH POTENCY/VITAMIN D) 600-5 MG-MCG TABS Take 1 tablet by mouth daily. 05/16/21  Yes [provider]  citalopram  (CELEXA ) 20 MG tablet Take 1 tablet (20 mg total) by mouth daily. 02/24/24 03/25/24 Yes Regalado, Belkys A, MD  enoxaparin  (LOVENOX ) 40 MG/0.4ML injection Inject 40 mg into the skin daily.   Yes [provider]  guaiFENesin  (ROBITUSSIN) 100 MG/5ML liquid Take 15 mLs by mouth every 6 (six) hours. 02/24/24  Yes Regalado, Belkys A, MD  lacosamide  (VIMPAT ) 200 MG TABS tablet Take 1 tablet (200 mg total) by mouth 2 (two) times daily. 02/24/24  Yes Regalado, Belkys A, MD  levothyroxine  (SYNTHROID , LEVOTHROID) 150 MCG tablet Take 150 mcg by mouth daily before breakfast.  12/30/14  Yes [provider]  metFORMIN (GLUCOPHAGE-XR) 500 MG 24 hr tablet Take 500 mg by mouth in the morning and at bedtime. 02/19/19  Yes [provider]  metoprolol  tartrate (LOPRESSOR ) 25 MG tablet Take 1 tablet (25 mg total) by mouth 2 (two) times daily. 02/24/24  Yes Regalado, Belkys A, MD  Multiple Vitamin (MULTIVITAMIN WITH MINERALS) TABS tablet Take 1 tablet by mouth daily. 02/25/24  Yes Regalado, Belkys  A, MD  nicotine  (NICODERM CQ  - DOSED IN MG/24 HOURS) 21 mg/24hr patch Place 1 patch (21 mg total) onto the skin daily. 02/25/24  Yes Regalado, Belkys A, MD  omeprazole (PRILOSEC) 20 MG capsule Take 20 mg by mouth daily.   Yes [provider]  oxyCODONE  (OXY IR/ROXICODONE ) 5 MG immediate release tablet Take 1-2 tablets (5-10 mg total) by mouth every 4 (four) hours as needed for severe pain (pain score 7-10). 02/24/24  Yes Regalado, Belkys A, MD  pantoprazole  (PROTONIX ) 40 MG tablet Take 1 tablet (40 mg  total) by mouth daily. 02/25/24 03/26/24 Yes Regalado, Belkys A, MD  PHENobarbital  (LUMINAL) 64.8 MG tablet Take 1 tablet (64.8 mg total) by mouth 2 (two) times daily. 02/24/24 03/25/24 Yes Regalado, Belkys A, MD  polyethylene glycol (MIRALAX  / GLYCOLAX ) 17 g packet Take 17 g by mouth daily. 02/25/24  Yes Regalado, Belkys A, MD  Tiotropium Bromide  Monohydrate (SPIRIVA  RESPIMAT) 2.5 MCG/ACT AERS Inhale 2 puffs into the lungs daily.   Yes [provider]  traMADol  (ULTRAM ) 50 MG tablet Take 50 mg by mouth every 6 (six) hours as needed for moderate pain (pain score 4-6).   Yes [provider]  TRELEGY ELLIPTA 100-62.5-25 MCG/ACT AEPB Inhale 1 puff into the lungs daily. Patient not taking: Reported on 02/28/2024 01/21/23   [provider]      Allergies    Levetiracetam , Tegretol [carbamazepine], Penicillins, and Topiramate    Review of Systems   Review of Systems  Constitutional:  Negative for fatigue and fever.  Gastrointestinal:  Positive for abdominal pain, blood in stool, constipation and hematochezia. Negative for nausea and vomiting.  Genitourinary:  Negative for flank pain.  Musculoskeletal:  Negative for back pain.  All other systems reviewed and are negative.   Physical Exam Updated Vital Signs BP 135/77   Pulse (!) 108   Temp 98.5 F (36.9 C)   Resp 18   Ht 5\' 6"  (1.676 m)   Wt 83 kg   SpO2 98%   BMI 29.53 kg/m  Physical Exam Vitals and nursing note reviewed.  Constitutional:      Appearance: Normal appearance.  HENT:     Head: Normocephalic.     Comments: Bandage placed to the right frontal aspect of his head s/p craniotomy.     Mouth/Throat:     Mouth: Mucous membranes are dry.  Eyes:     Pupils: Pupils are equal, round, and reactive to light.  Cardiovascular:     Rate and Rhythm: Normal rate.  Pulmonary:     Effort: Pulmonary effort is normal.  Abdominal:     General: Abdomen is flat.     Palpations: Abdomen is soft.     Tenderness: There is  abdominal tenderness. There is guarding.  Musculoskeletal:     Cervical back: Normal range of motion and neck supple.  Skin:    General: Skin is warm and dry.  Neurological:     Mental Status: He is alert and oriented to person, place, and time.  Psychiatric:        Behavior: Behavior is withdrawn.     ED Results / Procedures / Treatments   Labs (all labs ordered are listed, but only abnormal results are displayed) Labs Reviewed  CBC WITH DIFFERENTIAL/PLATELET - Abnormal; Notable for the following components:      Result Value   WBC 13.7 (*)    RBC 3.58 (*)    Hemoglobin 12.2 (*)    HCT 36.3 (*)  MCV 101.4 (*)    MCH 34.1 (*)    RDW 18.0 (*)    Neutro Abs 11.2 (*)    Monocytes Absolute 1.2 (*)    All other components within normal limits  COMPREHENSIVE METABOLIC PANEL WITH GFR - Abnormal; Notable for the following components:   Potassium 3.4 (*)    CO2 21 (*)    Glucose, Bld 149 (*)    Creatinine, Ser 0.60 (*)    ALT 55 (*)    All other components within normal limits  POC OCCULT BLOOD, ED - Abnormal; Notable for the following components:   Fecal Occult Bld POSITIVE (*)    All other components within normal limits  PROTIME-INR  TYPE AND SCREEN    EKG EKG Interpretation Date/Time:  Friday February 28 2024 18:39:30 EDT Ventricular Rate:  105 PR Interval:  150 QRS Duration:  96 QT Interval:  360 QTC Calculation: 476 R Axis:   43  Text Interpretation: Sinus tachycardia Borderline prolonged QT interval Since last tracing rate faster Confirmed by Sueellen Emery [phone]) on 02/28/2024 7:50:37 PM  Radiology CT HEAD WO CONTRAST ( ) Result Date: 02/28/2024 CLINICAL DATA:  Headache, no red flags s/p craniotomy with headache EXAM: CT HEAD WITHOUT CONTRAST TECHNIQUE: Contiguous axial images were obtained from the base of the skull through the vertex without intravenous contrast. RADIATION DOSE REDUCTION: This exam was performed according to the departmental dose-optimization  program which includes automated exposure control, adjustment of the mA and/or kV according to patient size and/or use of iterative reconstruction technique. COMPARISON:  02/24/2024 FINDINGS: Brain: Postoperative changes on the right. Low-density subdural hematoma again noted measuring 8 mm in thickness, stable. No midline shift. No new areas of hemorrhage. No hydrocephalus or acute infarction. Previously seen blood layering in the left occipital horn of the lateral ventricle no longer visualized. Vascular: No hyperdense vessel or unexpected calcification. Skull: No acute calvarial abnormality. Postoperative changes in the right calvarium. Sinuses/Orbits: No acute findings Other: None IMPRESSION: 8 mm low-density subdural hematoma over the right cerebral hemisphere is similar to prior study. No new areas of hemorrhage. Electronically Signed   By: Janeece Mechanic M.D.   On: 02/28/2024 20:00   CT Angio Abd/Pel W and/or Wo Contrast Result Date: 02/28/2024 CLINICAL DATA:  Lower GI bleed EXAM: CTA ABDOMEN AND PELVIS WITHOUT AND WITH CONTRAST TECHNIQUE: Multidetector CT imaging of the abdomen and pelvis was performed using the standard protocol during bolus administration of intravenous contrast. Multiplanar reconstructed images and MIPs were obtained and reviewed to evaluate the vascular anatomy. RADIATION DOSE REDUCTION: This exam was performed according to the departmental dose-optimization program which includes automated exposure control, adjustment of the mA and/or kV according to patient size and/or use of iterative reconstruction technique. CONTRAST:  70mL OMNIPAQUE  IOHEXOL  350 MG/ML SOLN COMPARISON:  09/06/2023 FINDINGS: VASCULAR Aorta: Irregular calcified and noncalcified plaque throughout the aorta. No aneurysm or dissection. Celiac: Patent without evidence of aneurysm, dissection, vasculitis or significant stenosis. SMA: Patent without evidence of aneurysm, dissection, vasculitis or significant stenosis.  Renals: Both renal arteries are patent without evidence of aneurysm, dissection, vasculitis, fibromuscular dysplasia or significant stenosis. IMA: Patent without evidence of aneurysm, dissection, vasculitis or significant stenosis. Inflow: Calcified plaque.  No aneurysm or dissection. Proximal Outflow: Calcifications.  No aneurysm or dissection. Veins: No obvious venous abnormality within the limitations of this arterial phase study. Review of the MIP images confirms the above findings. NON-VASCULAR Lower chest: No acute findings Hepatobiliary: No focal hepatic abnormality. Gallbladder unremarkable. Pancreas: No  focal abnormality or ductal dilatation. Spleen: No focal abnormality.  Normal size. Adrenals/Urinary Tract: No adrenal abnormality. No focal renal abnormality. No stones or hydronephrosis. Urinary bladder is unremarkable. Stomach/Bowel: There is wall thickening and surrounding inflammation involving the descending colon and sigmoid colon compatible with infectious or inflammatory colitis. No active extravasation of contrast. Stomach and small bowel decompressed, unremarkable. Lymphatic: No adenopathy Reproductive: No visible focal abnormality. Other: No free fluid or free air. Musculoskeletal: No acute bony abnormality. The stranding noted in the lateral left hip soft tissues, possibly hematoma. Recommend clinical correlation for recent trauma. IMPRESSION: VASCULAR Irregular calcified and noncalcified plaque throughout the aorta. No aneurysm or dissection. No active extravasation of contrast. NON-VASCULAR Circumferential wall thickening involving the descending colon and sigmoid colon compatible with infectious or inflammatory colitis. Stranding in the subcutaneous soft tissues in the left lateral hip region, possibly hematoma. Recommend clinical correlation for recent trauma/injury in the area. Electronically Signed   By: Janeece Mechanic M.D.   On: 02/28/2024 19:57    Procedures Procedures     Medications Ordered in ED Medications  sodium chloride  0.9 % bolus 1,000 mL (1,000 mLs Intravenous New Bag/Given 02/28/24 1841)    And  0.9 %  sodium chloride  infusion (has no administration in time range)  oxyCODONE  (Oxy IR/ROXICODONE ) immediate release tablet 5 mg (5 mg Oral Given 02/28/24 1745)  pantoprazole  (PROTONIX ) injection 40 mg (40 mg Intravenous Given 02/28/24 1842)  ondansetron  (ZOFRAN ) injection 4 mg (4 mg Intravenous Given 02/28/24 1842)  iohexol  (OMNIPAQUE ) 350 MG/ML injection 70 mL (70 mLs Intravenous Contrast Given 02/28/24 1948)    ED Course/ Medical Decision Making/ A&P Clinical Course as of 02/28/24 2042  Fri Feb 28, 2024  1735 Creatinine(!): 0.60 [JS]  1801 Fecal Occult Blood, POC(!): POSITIVE [JS]    Clinical Course User Index [JS] Judieth Mckown, PA-C                                 Medical Decision Making Amount and/or Complexity of Data Reviewed Labs: ordered. Decision-making details documented in ED Course. Radiology: ordered.  Risk Prescription drug management.    This patient presents to the ED for concern of rectal bleeding, this involves a number of treatment options, and is a complaint that carries with it a high risk of complications and morbidity.  The differential diagnosis includes diverticulitis, diverticular bleed versus hemorrhoids.    Co morbidities: Discussed in HPI   Brief History:  See HPI  EMR reviewed including pt PMHx, past surgical history and past visits to ER.   See HPI for more details   Lab Tests:  I ordered and independently interpreted labs.  The pertinent results include:    Labs notable for CBC with leukocytosis of 13.7, hemoglobin is improved from his baseline at this time.  CMP with slight decrease in potassium however stable.  Creatinine level is normal.  LFTs are within normal limits.  PT and INR are normal, no anticoagulation medication noted on his chart.  Hemoccult positive.   Imaging Studies:   CT Angio  abdomen and pelvis showed: VASCULAR    Irregular calcified and noncalcified plaque throughout the aorta. No  aneurysm or dissection.    No active extravasation of contrast.    NON-VASCULAR    Circumferential wall thickening involving the descending colon and  sigmoid colon compatible with infectious or inflammatory colitis.    Stranding in the subcutaneous soft tissues in the left  lateral hip  region, possibly hematoma. Recommend clinical correlation for recent  trauma/injury in the area.   Medicines ordered:  I ordered medication including roxicodone   for pain control per home medication  Reevaluation of the patient after these medicines showed that the patient improved I have reviewed the patients home medicines and have made adjustments as needed  Consults:  I requested consultation with Dr. Elvin Hammer and sent him a message via secure chat in order to make them aware of patient as he is hemodynamically stable at this time.  Reevaluation:  After the interventions noted above I re-evaluated patient and found that they have :stayed the same  Social Determinants of Health:  The patient's social determinants of health were a factor in the care of this patient  Problem List / ED Course:  Patient presented to the ED status post craniotomy for evacuation of a subdural hematoma, after being discharged 4 days ago with new onset of rectal bleeding.  According to the wife he soaked through an entire diaper with bright red blood.  She does report blood thinners on board, however no medication listed under his current list.  Patient endorses pain along the left lower quadrant.  Is also endorsing a headache to the right side of his head, where prior craniotomy for evacuation of a subdural hematoma was done.  He is on Roxie codon at home for pain control, 1 single dose was given to him while in the emergency department.  His vitals are remarkable for slight increase in his heart rate.  CBC with a  leukocytosis, hemoglobin is improved than prior.  CMP with no electrolyte derangement.  He is focally tender along the left lower quadrant, some concern for diverticular bleed versus diverticulitis versus hemorrhoid.  Occult is positive on today's visit.  PT/INR normal. CT angio abdomen and pelvis shows colitis, he remains hemodynamically stable with a stable hemoglobin.  I did send a secure chat to Dr. Elvin Hammer of  GI who is currently on-call in order to obtain further recommendations and make him aware of patient.  I do feel that patient warrants admission at this time for further management.  Antibiotics have been ordered.  CT of his head remained stable.   Dispostion:  After consideration of the diagnostic results and the patients response to treatment, I feel that the patent would benefit from admission for treatment of his rectal bleeding 8:42 PM spoke to Dr. Lydia Sams [facility] hospitalist, appreciate his assistance.  He will admit patient for further management.   Portions of this note were generated with Scientist, clinical (histocompatibility and immunogenetics). Dictation errors may occur despite best attempts at proofreading. ;  Final Clinical Impression(s) / ED Diagnoses Final diagnoses:  Rectal bleeding  Colitis    Rx / DC Orders ED Discharge Orders     None         Berdine Bread 02/28/24 2042    Sueellen Emery, MD 02/28/24 2233

## 2024-02-28 NOTE — H&P (Signed)
 History and Physical    [name] GNF:[id number] DOB: [birth year] DOA: 02/28/2024  PCP: Claudell Cruz, MD  Patient coming from: SNF  I have personally briefly reviewed patient's old medical records in [facility]  Chief Complaint: Rectal bleeding  HPI: [name] is a 63 y.o. male with medical history significant for COPD, chronic respiratory failure with hypoxia on 3 L O2 [place] with activity, HTN, hypothyroidism, depression/anxiety, tobacco use, recent traumatic right SDH complicated by seizure activity s/p right-sided burr holes for evacuation 02/18/2024, and RUE DVT on Lovenox  who presented to the ED from SNF for evaluation of rectal bleeding.  Patient with recent prolonged admission from 5/8-6/2.  Initially admitted to ICU after he was found to be bradycardic, hypothermic, and noted to have 9 mm right SDH with 3 mm midline shift and small volume acute SAH.  This was complicated by seizure activity.  He was transferred out of ICU on 5/12.  Developed fever and subsequently found to have RUE DVT felt related to PICC line.  Fever resolved after PICC line was removed.  Anticoagulation was not started due to his SDH.  He had a repeat CT head on 5/27 which showed worsening of SDH.  He underwent right-sided bur hole and placement of subdural drain for evacuation of SDH by Dr. [name] on 5/27.  Repeat CT head 5/30 was reviewed by neurosurgery and stable.  They felt it was okay to start low-dose Lovenox  to treat DVT, this was started on 5/30.  Patient was discharged to SNF on 6/2.  While at SNF is wife was helping feed him earlier today when he suddenly began to complain of abdominal pain.  Then he developed an episode of rectal bleeding consisting of bright red blood.  He was reporting left lower quadrant abdominal pain.  Patient was brought to the ED for further evaluation.  ED Course  Labs/Imaging on admission: I have personally reviewed following labs and imaging studies.  In the vitals  showed BP 151/79, pulse 97, RR 16, temp 98.5 F, SpO2 97% on room air.  Labs showed WBC 13.7, hemoglobin 12.2, platelets 286, sodium 137, potassium 3.4, bicarb 21, BUN 23, creatinine 0.60, serum glucose 149.  FOBT positive.  INR 1.1.  CTA abdomen/pelvis showed circumferential wall thickening involving descending colon and sigmoid colon compatible with infectious or inflammatory colitis.  Stranding in the subcutaneous soft tissues in the left lateral hip noted, possibly hematoma.  No aneurysm or dissection.  No active extravasation of contrast.  CT head without contrast showed 8 mm low-density SDH over the right cerebral hemisphere similar to prior study.  No new areas of hemorrhage.  Patient was given IV ceftriaxone  and Flagyl, 1 L normal saline, IV Protonix  40 mg.  EDP spoke with [facility], their team will see in consultation tomorrow.  The hospitalist service was consulted to admit.  Review of Systems: All systems reviewed and are negative except as documented in history of present illness above.   Past Medical History:  Diagnosis Date   Anemia    Depression    Diabetes mellitus without complication (HCC)    Emphysema of lung (HCC)    GERD (gastroesophageal reflux disease)    Hiatal hernia    Hyperlipidemia    Hypertension    Memory loss    Seizures (HCC)    Thyroid  disease     Past Surgical History:  Procedure Laterality Date   APPENDECTOMY     CRANIOTOMY Right 02/18/2024   Procedure: CRANIOTOMY HEMATOMA  EVACUATION SUBDURAL;  Surgeon: Van Gelinas, MD;  Location: [facility] OR;  Service: Neurosurgery;  Laterality: Right;  RIGHT BURR HOLES ,   DENTAL SURGERY     EYE SURGERY     TONSILLECTOMY AND ADENOIDECTOMY      Social History: Social History   Tobacco Use   Smoking status: Every Day    Types: E-cigarettes, Cigarettes   Smokeless tobacco: Never   Tobacco comments:    11/01/2015 "did smoke 3 ppd; he's been vaping for 4 years; no nicotine  for the past year; smoked a  cigarette 2 days ago"  Substance Use Topics   Alcohol use: No    Alcohol/week: 0.0 standard drinks of alcohol   Drug use: No   Allergies  Allergen Reactions   Levetiracetam  Other (See Comments)    Severe anxiety, agitation, hyperactivity, very negative disposition   Tegretol [Carbamazepine] Anxiety and Other (See Comments)    Increased seizures/ Induces seizures   Penicillins Other (See Comments)    Reaction not noted   Topiramate Other (See Comments)    Reaction not noted    Family History  Problem Relation Age of Onset   Stroke Mother    Heart disease Father    Thyroid  disease Father    Thyroid  disease Brother      Prior to Admission medications   Medication Sig Start Date End Date Taking? Authorizing Provider  acetaminophen  (TYLENOL ) 500 MG tablet Take 1 tablet (500 mg total) by mouth 3 (three) times daily. 02/24/24  Yes Regalado, Belkys A, MD  albuterol  (PROVENTIL  HFA;VENTOLIN  HFA) 108 (90 BASE) MCG/ACT inhaler Inhale 2 puffs into the lungs every 4 (four) hours as needed for wheezing or shortness of breath.   Yes [provider]  ALPRAZolam  (XANAX ) 0.5 MG tablet Take 1 tablet (0.5 mg total) by mouth 2 (two) times daily. 02/24/24  Yes Regalado, Belkys A, MD  Ascorbic Acid (VITAMIN C PO) Take 1 tablet by mouth daily.   Yes [provider]  Calcium  Carb-Cholecalciferol (CALCIUM  HIGH POTENCY/VITAMIN D) 600-5 MG-MCG TABS Take 1 tablet by mouth daily. 05/16/21  Yes [provider]  citalopram  (CELEXA ) 20 MG tablet Take 1 tablet (20 mg total) by mouth daily. 02/24/24 03/25/24 Yes Regalado, Belkys A, MD  enoxaparin  (LOVENOX ) 40 MG/0.4ML injection Inject 40 mg into the skin daily.   Yes [provider]  guaiFENesin  (ROBITUSSIN) 100 MG/5ML liquid Take 15 mLs by mouth every 6 (six) hours. 02/24/24  Yes Regalado, Belkys A, MD  lacosamide  (VIMPAT ) 200 MG TABS tablet Take 1 tablet (200 mg total) by mouth 2 (two) times daily. 02/24/24  Yes Regalado, Belkys A, MD   levothyroxine  (SYNTHROID , LEVOTHROID) 150 MCG tablet Take 150 mcg by mouth daily before breakfast.  12/30/14  Yes [provider]  metFORMIN (GLUCOPHAGE-XR) 500 MG 24 hr tablet Take 500 mg by mouth in the morning and at bedtime. 02/19/19  Yes [provider]  metoprolol  tartrate (LOPRESSOR ) 25 MG tablet Take 1 tablet (25 mg total) by mouth 2 (two) times daily. 02/24/24  Yes Regalado, Belkys A, MD  Multiple Vitamin (MULTIVITAMIN WITH MINERALS) TABS tablet Take 1 tablet by mouth daily. 02/25/24  Yes Regalado, Belkys A, MD  nicotine  (NICODERM CQ  - DOSED IN MG/24 HOURS) 21 mg/24hr patch Place 1 patch (21 mg total) onto the skin daily. 02/25/24  Yes Regalado, Belkys A, MD  omeprazole (PRILOSEC) 20 MG capsule Take 20 mg by mouth daily.   Yes [provider]  oxyCODONE  (OXY IR/ROXICODONE ) 5 MG  immediate release tablet Take 1-2 tablets (5-10 mg total) by mouth every 4 (four) hours as needed for severe pain (pain score 7-10). 02/24/24  Yes Regalado, Belkys A, MD  pantoprazole  (PROTONIX ) 40 MG tablet Take 1 tablet (40 mg total) by mouth daily. 02/25/24 03/26/24 Yes Regalado, Belkys A, MD  PHENobarbital  (LUMINAL) 64.8 MG tablet Take 1 tablet (64.8 mg total) by mouth 2 (two) times daily. 02/24/24 03/25/24 Yes Regalado, Belkys A, MD  polyethylene glycol (MIRALAX  / GLYCOLAX ) 17 g packet Take 17 g by mouth daily. 02/25/24  Yes Regalado, Belkys A, MD  Tiotropium Bromide  Monohydrate (SPIRIVA  RESPIMAT) 2.5 MCG/ACT AERS Inhale 2 puffs into the lungs daily.   Yes [provider]  traMADol  (ULTRAM ) 50 MG tablet Take 50 mg by mouth every 6 (six) hours as needed for moderate pain (pain score 4-6).   Yes [provider]  TRELEGY ELLIPTA 100-62.5-25 MCG/ACT AEPB Inhale 1 puff into the lungs daily. Patient not taking: Reported on 02/28/2024 01/21/23   [provider]    Physical Exam: Vitals:   02/28/24 1430 02/28/24 1445 02/28/24 1725 02/28/24 1745  BP: (!) 151/79  (!) 147/67 135/77   Pulse: 97  (!) 103 (!) 108  Resp: 16  (!) 23 18  Temp: 98.5 F (36.9 C)  98.5 F (36.9 C)   TempSrc: Oral     SpO2: 97%  97% 98%  Weight:  83 kg    Height:  5\' 6"  (1.676 m)     Constitutional: Somnolent but responds to voice, no acute distress Head: Surgical bandage in place over right frontal head surgical site Eyes: EOMI, lids and conjunctivae normal ENMT: Mucous membranes are moist. Posterior pharynx clear of any exudate or lesions.Normal dentition.  Neck: normal, supple, no masses. Respiratory: clear to auscultation bilaterally, no wheezing, no crackles. Normal respiratory effort. No accessory muscle use.  Cardiovascular: Regular rate and rhythm, no murmurs / rubs / gallops. No extremity edema. 2+ pedal pulses. Abdomen: no tenderness, no masses palpated. Musculoskeletal: no clubbing / cyanosis. No joint deformity upper and lower extremities. Good ROM. Skin: no rashes, lesions, ulcers. No induration Neurologic: Sensation intact. Strength 5/5 in all 4.  Psychiatric: Somnolent but responds to voice and answers questions.  Alert and oriented x 3. Normal mood.   EKG: Personally reviewed. Sinus tachycardia, rate 105, motion artifact throughout.  Rate is faster when compared to previous.  Assessment/Plan Principal Problem:   Acute lower GI bleeding Active Problems:   Colitis   Subdural hematoma (HCC)   Deep vein thrombosis (DVT) of right upper extremity (HCC)   Seizure disorder (HCC)   Essential hypertension, benign   Hypothyroidism   COPD (chronic obstructive pulmonary disease) (HCC)   Chai Routh is a 63 y.o. male with medical history significant for COPD, chronic respiratory failure with hypoxia on 3 L O2 [place] with activity, HTN, hypothyroidism, depression/anxiety, tobacco use, recent traumatic right SDH complicated by seizure activity s/p right-sided bur holes for evacuation 02/18/2024, and RUE DVT on Lovenox  who is admitted with acute lower GI bleeding.  Assessment and  Plan: Acute lower GI bleeding: Bright red bleeding per rectum occurring at SNF PTA.  Recently started on SQ Lovenox  for RUE DVT.  Hemoglobin stable at 12.2.  CTA abdomen negative for extravasation of contrast. - Hold Lovenox  - Type and screen - Repeat CBC in a.m. - N.p.o. after midnight - [facility] to consult - Gentle IV fluid hydration overnight  Colitis involving the descending and sigmoid colon: CT angio abdomen/pelvis  with findings suggestive of infectious versus inflammatory colitis involving the descending and sigmoid colon.  Leukocytosis noted. - Continue empiric IV ceftriaxone  and Flagyl for now  Recent traumatic SDH s/p right-sided burr holes and evacuation 02/18/2024: Repeat CT head shows stable 8 mm low-density SDH over right cerebral hemisphere.  No new areas of hemorrhage.  Recent RUE DVT felt secondary to PICC line: Was started on SQ Lovenox  on 5/30.  Anticoagulation on hold now due to lower GI bleeding.  Seizure disorder: Continue Vimpat  and phenobarbital .  COPD with chronic hypoxic respiratory failure: Stable without wheezing on admission.  Uses 3 L O2 via [place] as needed with activity.  Continue Spiriva  and albuterol  as needed.  Hypertension: Resume Lopressor .  Hypokalemia: Supplementing.  Hypothyroidism: Continue Synthroid .  Depression/anxiety: Continue Celexa  and home Xanax  0.5 mg twice daily.   DVT prophylaxis: SCDs Start: 02/28/24 2226 Code Status: Full code Family Communication: Discussed with patient Disposition Plan: From SNF, dispo pending clinical progress Consults called: [facility] Severity of Illness: The appropriate patient status for this patient is INPATIENT. Inpatient status is judged to be reasonable and necessary in order to provide the required intensity of service to ensure the patient's safety. The patient's presenting symptoms, physical exam findings, and initial radiographic and laboratory data in the context of their chronic  comorbidities is felt to place them at high risk for further clinical deterioration. Furthermore, it is not anticipated that the patient will be medically stable for discharge from the hospital within 2 midnights of admission.   * I certify that at the point of admission it is my clinical judgment that the patient will require inpatient hospital care spanning beyond 2 midnights from the point of admission due to high intensity of service, high risk for further deterioration and high frequency of surveillance required.Edith Gores MD [facility]  If 7PM-7AM, please contact night-coverage [email or web address]  02/28/2024, 10:34 PM

## 2024-02-28 NOTE — Hospital Course (Signed)
 [name] is a 63 y.o. male with medical history significant for COPD, chronic respiratory failure with hypoxia on 3 L O2 [place] with activity, HTN, hypothyroidism, depression/anxiety, tobacco use, recent traumatic right SDH complicated by seizure activity s/p right-sided bur holes for evacuation 02/18/2024, and RUE DVT on Lovenox  who is admitted with acute lower GI bleeding.

## 2024-02-29 ENCOUNTER — Encounter (HOSPITAL_COMMUNITY): Payer: Self-pay | Admitting: Internal Medicine

## 2024-02-29 DIAGNOSIS — K922 Gastrointestinal hemorrhage, unspecified: Secondary | ICD-10-CM | POA: Diagnosis not present

## 2024-02-29 DIAGNOSIS — K559 Vascular disorder of intestine, unspecified: Secondary | ICD-10-CM

## 2024-02-29 DIAGNOSIS — K625 Hemorrhage of anus and rectum: Principal | ICD-10-CM

## 2024-02-29 DIAGNOSIS — I82621 Acute embolism and thrombosis of deep veins of right upper extremity: Secondary | ICD-10-CM

## 2024-02-29 DIAGNOSIS — R109 Unspecified abdominal pain: Secondary | ICD-10-CM

## 2024-02-29 DIAGNOSIS — D72829 Elevated white blood cell count, unspecified: Secondary | ICD-10-CM

## 2024-02-29 LAB — BASIC METABOLIC PANEL WITH GFR
Anion gap: 10 (ref 5–15)
BUN: 18 mg/dL (ref 8–23)
CO2: 21 mmol/L — ABNORMAL LOW (ref 22–32)
Calcium: 8.7 mg/dL — ABNORMAL LOW (ref 8.9–10.3)
Chloride: 105 mmol/L (ref 98–111)
Creatinine, Ser: 0.46 mg/dL — ABNORMAL LOW (ref 0.61–1.24)
GFR, Estimated: >60 mL/min
Glucose, Bld: 151 mg/dL — ABNORMAL HIGH (ref 70–99)
Potassium: 2.9 mmol/L — ABNORMAL LOW (ref 3.5–5.1)
Sodium: 136 mmol/L (ref 135–145)

## 2024-02-29 LAB — CBC
HCT: 34.4 % — ABNORMAL LOW (ref 39.0–52.0)
HCT: 30.6 % — ABNORMAL LOW (ref 39.0–52.0)
Hemoglobin: 11.3 g/dL — ABNORMAL LOW (ref 13.0–17.0)
Hemoglobin: 10.3 g/dL — ABNORMAL LOW (ref 13.0–17.0)
MCH: 34.1 pg — ABNORMAL HIGH (ref 26.0–34.0)
MCH: 34 pg (ref 26.0–34.0)
MCHC: 32.8 g/dL (ref 30.0–36.0)
MCHC: 33.7 g/dL (ref 30.0–36.0)
MCV: 103.9 fL — ABNORMAL HIGH (ref 80.0–100.0)
MCV: 101 fL — ABNORMAL HIGH (ref 80.0–100.0)
Platelets: 272 10*3/uL (ref 150–400)
Platelets: 251 10*3/uL (ref 150–400)
RBC: 3.31 MIL/uL — ABNORMAL LOW (ref 4.22–5.81)
RBC: 3.03 MIL/uL — ABNORMAL LOW (ref 4.22–5.81)
RDW: 17.9 % — ABNORMAL HIGH (ref 11.5–15.5)
RDW: 17.8 % — ABNORMAL HIGH (ref 11.5–15.5)
WBC: 13.2 10*3/uL — ABNORMAL HIGH (ref 4.0–10.5)
WBC: 14.2 10*3/uL — ABNORMAL HIGH (ref 4.0–10.5)
nRBC: 0 % (ref 0.0–0.2)
nRBC: 0 % (ref 0.0–0.2)

## 2024-02-29 LAB — MAGNESIUM: Magnesium: 1.4 mg/dL — ABNORMAL LOW (ref 1.7–2.4)

## 2024-02-29 MED ORDER — DIPHENHYDRAMINE HCL 50 MG/ML IJ SOLN
25.0000 mg | Freq: Once | INTRAMUSCULAR | Status: AC
  Administered 2024-02-29: 25 mg via INTRAVENOUS
  Filled 2024-02-29: qty 1

## 2024-02-29 MED ORDER — MAGNESIUM SULFATE 4 GM/100ML IV SOLN
4.0000 g | Freq: Once | INTRAVENOUS | Status: AC
  Administered 2024-02-29: 4 g via INTRAVENOUS
  Filled 2024-02-29: qty 100

## 2024-02-29 MED ORDER — POTASSIUM CHLORIDE 20 MEQ PO PACK
40.0000 meq | PACK | Freq: Once | ORAL | Status: AC
  Administered 2024-02-29: 40 meq via ORAL
  Filled 2024-02-29: qty 2

## 2024-02-29 MED ORDER — POTASSIUM CHLORIDE 10 MEQ/100ML IV SOLN
10.0000 meq | INTRAVENOUS | Status: AC
  Administered 2024-02-29 (×2): 10 meq via INTRAVENOUS
  Filled 2024-02-29 (×2): qty 100

## 2024-02-29 MED ORDER — PROCHLORPERAZINE EDISYLATE 10 MG/2ML IJ SOLN: 10.0000 mg | Freq: Once | INTRAMUSCULAR | Status: DC

## 2024-02-29 MED ORDER — OXYCODONE HCL 5 MG PO TABS
5.0000 mg | ORAL_TABLET | ORAL | Status: DC | PRN
  Administered 2024-03-01: 5 mg via ORAL
  Administered 2024-03-01: 10 mg via ORAL
  Administered 2024-03-02: 5 mg via ORAL
  Administered 2024-03-02 – 2024-03-05 (×10): 10 mg via ORAL
  Administered 2024-03-06 – 2024-03-07 (×2): 5 mg via ORAL
  Administered 2024-03-07 – 2024-03-08 (×2): 10 mg via ORAL
  Filled 2024-02-29 (×3): qty 2
  Filled 2024-02-29: qty 1
  Filled 2024-02-29 (×7): qty 2
  Filled 2024-02-29: qty 1
  Filled 2024-02-29: qty 2
  Filled 2024-02-29 (×2): qty 1
  Filled 2024-02-29: qty 2
  Filled 2024-02-29: qty 1
  Filled 2024-02-29: qty 2

## 2024-02-29 MED ORDER — POTASSIUM CHLORIDE CRYS ER 20 MEQ PO TBCR: 40.0000 meq | EXTENDED_RELEASE_TABLET | Freq: Once | ORAL | Status: DC

## 2024-02-29 NOTE — Progress Notes (Signed)
 [name]  ZOX:[id number] DOB: [birth year] DOA: 02/28/2024 PCP: Claudell Cruz, MD    Brief Narrative:  63 year old with a history of COPD with tobacco abuse, chronic hypoxia requiring 3 L nasal cannula with activity, HTN, hypothyroidism, depression/anxiety, right upper extremity DVT on Lovenox , and a recent traumatic right subdural hematoma complicated by seizure activity requiring right sided bur hole for evacuation 02/18/2024 (D/C to SNF 02/24/24) who was transported to the ER from his SNF with rectal bleeding.  He was enjoying a meal at the SNF when he developed the acute onset of left lower quadrant abdominal pain followed by an episode of bright red blood per rectum.  In the ER his blood pressure was stable.  Hemoglobin was found to be 12.2.  He was guaiac positive.  INR was 1.1.  CT of the abdomen/pelvis noted circumferential wall thickening of the descending colon and sigmoid colon compatible with infectious or inflammatory colitis.  CT head revealed a stable right sided SDH.  Goals of Care:   Code Status: Full Code   DVT prophylaxis: SCDs Start: 02/28/24 2226   Interim Hx: Afebrile since admission.  Persisting sinus tachycardia with heart rate 101-125.  Blood pressure stable/elevated.  Oxygen saturation stable.  No acute distress at the time of my exam, somewhat slow to respond to questions.  Spoke with wife at bedside.  Assessment & Plan:  BRBPR Likely due to combination of Lovenox  and acute colitis - avoid anticoagulation for now - monitor hemoglobin in serial fashion  Acute colitis of descending and sigmoid colon -likely ischemic in nature As noted on CT abdomen/pelvis -on empiric antibiotic coverage for now -GI following and feels CT findings are most consistent with an ischemic etiology -will discontinue antibiotics if GI pathogen panel negative  Traumatic subdural hematoma requiring right sided bur hole for evacuation 02/18/2024 Follow-up CT head notes stable 8 mm low-density  right cerebral SDH  Right upper extremity DVT related to PICC line PICC line was removed when the DVT was noted during recent hospital stay -Lovenox  was being used for anticoagulation but is presently on hold due to above  Seizure disorder Due to above -continue Vimpat  and phenobarbital   COPD with chronic hypoxic respiratory failure Requires 3 L O2 with activity at baseline -stable at present -continue usual meds  HTN Continue Lopressor   Hypokalemia Continue to supplement and monitor -check magnesium   Hypothyroidism Continue usual Synthroid  dose  Depression/anxiety Continue usual Celexa  and Xanax  doses   Family Communication: Spoke with wife at bedside at length Disposition: Eventual return to rehab facility   Objective: Blood pressure (!) 161/91, pulse (!) 101, temperature 98 F (36.7 C), temperature source Oral, resp. rate 18, height 5\' 6"  (1.676 m), weight 83 kg, SpO2 100%.  Intake/Output Summary (Last 24 hours) at 02/29/2024 0745 Last data filed at 02/28/2024 2126 Gross per 24 hour  Intake 1000 ml  Output --  Net 1000 ml   Filed Weights   02/28/24 1445  Weight: 83 kg    Examination: General: No acute respiratory distress Lungs: Clear to auscultation bilaterally without wheezes or crackles Cardiovascular: Regular rate and rhythm without murmur gallop or rub normal S1 and S2 Abdomen: Nontender, nondistended, soft, bowel sounds positive, no rebound, no ascites, no appreciable mass Extremities: No significant cyanosis, clubbing, or edema bilateral lower extremities  CBC: Recent Labs  Lab 02/23/24 0811 02/28/24 1531 02/29/24 0510  WBC 5.8 13.7* 13.2*  NEUTROABS  --  11.2*  --   HGB 9.9* 12.2* 11.3*  HCT 30.3*  36.3* 34.4*  MCV 98.4 101.4* 103.9*  PLT 317 286 272   Basic Metabolic Panel: Recent Labs  Lab 02/23/24 0811 02/28/24 1531 02/29/24 0510  NA 136 137 136  K 3.8 3.4* 2.9*  CL 103 101 105  CO2 24 21* 21*  GLUCOSE 128* 149* 151*  BUN 19 23 18    CREATININE 0.60* 0.60* 0.46*  CALCIUM  9.2 9.6 8.7*   GFR: Estimated Creatinine Clearance: 93.1 mL/min (A) (by C-G formula based on SCr of 0.46 mg/dL (L)).   Scheduled Meds:  ALPRAZolam   0.5 mg Oral BID   citalopram   20 mg Oral Daily   lacosamide   200 mg Oral BID   levothyroxine   150 mcg Oral QAC breakfast   metoprolol  tartrate  25 mg Oral BID   pantoprazole   40 mg Oral Daily   PHENobarbital   64.8 mg Oral BID   potassium chloride   40 mEq Oral Once   prochlorperazine   10 mg Intravenous Once   sodium chloride  flush  3 mL Intravenous Q12H   umeclidinium bromide   1 puff Inhalation Daily   Continuous Infusions:  sodium chloride  100 mL (02/28/24 2249)   cefTRIAXone  (ROCEPHIN )  IV Stopped (02/28/24 2250)   metronidazole  Stopped (02/28/24 2250)     LOS: 1 day   Abbe Abate, MD [facility] Office  [phone] Pager - Text Page per Amion  If 7PM-7AM, please contact night-coverage per Amion 02/29/2024, 7:45 AM

## 2024-02-29 NOTE — Consult Note (Addendum)
 Consultation  Referring Provider: [facility] PA-C Primary Care Physician:  Claudell Cruz, MD Primary Gastroenterologist:  unassigned  Reason for Consultation: Abdominal pain, rectal bleeding, acute colitis  HPI: [name] is a 63 y.o. male with history of COPD, on home O2 3 L,, hypertension, who had recent hospitalization for subdural hematoma after a fall, this was complicated by seizure activity and he required right sided bur hole for evacuation on 02/18/2024.  During that admission also had right upper extremity DVT and was placed on Lovenox  at the time of discharge.  He was discharged on 02/24/2024 to a rehab facility. Apparently yesterday evening he was eating dinner which his wife was feeding him, started complaining of lower abdominal pain and then had episode of bright red blood per rectum. He has been hemodynamically stable since arrival initial hemoglobin 12.2. Not had any previous GI issues and does not believe he has had any prior GI evaluation.  CTA was done which showed wall thickening and inflammation of the descending into the sigmoid colon consistent with an infectious versus inflammatory colitis.  GI path panel pending IV metronidazole  and Rocephin  have been initiated  He has remained hemodynamically stable Admit labs WBC 13.7/hemoglobin 12.2/hematocrit 36.3 actually up from discharge on 02/23/2024 at which time hemoglobin was 9.9  Potassium 3.4/BUN 23/creatinine 0.60 LFTs normal with exception of ALT at 55 Pro time 14.4/INR 1.1  Today WBC 13.2/hemoglobin 11.3/hematocrit 34.4/MCV 103.9 Potassium 2.9/BUN 18/creatinine 0.46  Patient says he still having some pain in his lower abdomen but has not had any further bleeding today  Wife at bedside after I had initially interviewed the patient, stating she is very unhappy with his rehab facility, did not feel that they are taking good care of him.     Past Medical History:  Diagnosis Date   Anemia    Depression     Diabetes mellitus without complication (HCC)    Emphysema of lung (HCC)    GERD (gastroesophageal reflux disease)    Hiatal hernia    Hyperlipidemia    Hypertension    Memory loss    Seizures (HCC)    Thyroid  disease     Past Surgical History:  Procedure Laterality Date   APPENDECTOMY     CRANIOTOMY Right 02/18/2024   Procedure: CRANIOTOMY HEMATOMA EVACUATION SUBDURAL;  Surgeon: Van Gelinas, MD;  Location: [facility] OR;  Service: Neurosurgery;  Laterality: Right;  RIGHT BURR HOLES ,   DENTAL SURGERY     EYE SURGERY     TONSILLECTOMY AND ADENOIDECTOMY      Prior to Admission medications   Medication Sig Start Date End Date Taking? Authorizing Provider  acetaminophen  (TYLENOL ) 500 MG tablet Take 1 tablet (500 mg total) by mouth 3 (three) times daily. 02/24/24  Yes Regalado, Belkys A, MD  albuterol  (PROVENTIL  HFA;VENTOLIN  HFA) 108 (90 BASE) MCG/ACT inhaler Inhale 2 puffs into the lungs every 4 (four) hours as needed for wheezing or shortness of breath.   Yes [provider]  ALPRAZolam  (XANAX ) 0.5 MG tablet Take 1 tablet (0.5 mg total) by mouth 2 (two) times daily. 02/24/24  Yes Regalado, Belkys A, MD  Ascorbic Acid (VITAMIN C PO) Take 1 tablet by mouth daily.   Yes [provider]  Calcium  Carb-Cholecalciferol (CALCIUM  HIGH POTENCY/VITAMIN D) 600-5 MG-MCG TABS Take 1 tablet by mouth daily. 05/16/21  Yes [provider]  citalopram  (CELEXA ) 20 MG tablet Take 1 tablet (20 mg total) by mouth daily. 02/24/24 03/25/24 Yes Regalado, Brigido Canales, MD  enoxaparin  (LOVENOX ) 40 MG/0.4ML injection Inject 40 mg into the skin daily.   Yes [provider]  guaiFENesin  (ROBITUSSIN) 100 MG/5ML liquid Take 15 mLs by mouth every 6 (six) hours. 02/24/24  Yes Regalado, Belkys A, MD  lacosamide  (VIMPAT ) 200 MG TABS tablet Take 1 tablet (200 mg total) by mouth 2 (two) times daily. 02/24/24  Yes Regalado, Belkys A, MD  levothyroxine  (SYNTHROID , LEVOTHROID) 150 MCG tablet Take 150 mcg by  mouth daily before breakfast.  12/30/14  Yes [provider]  metFORMIN (GLUCOPHAGE-XR) 500 MG 24 hr tablet Take 500 mg by mouth in the morning and at bedtime. 02/19/19  Yes [provider]  metoprolol  tartrate (LOPRESSOR ) 25 MG tablet Take 1 tablet (25 mg total) by mouth 2 (two) times daily. 02/24/24  Yes Regalado, Belkys A, MD  Multiple Vitamin (MULTIVITAMIN WITH MINERALS) TABS tablet Take 1 tablet by mouth daily. 02/25/24  Yes Regalado, Belkys A, MD  nicotine  (NICODERM CQ  - DOSED IN MG/24 HOURS) 21 mg/24hr patch Place 1 patch (21 mg total) onto the skin daily. 02/25/24  Yes Regalado, Belkys A, MD  omeprazole (PRILOSEC) 20 MG capsule Take 20 mg by mouth daily.   Yes [provider]  oxyCODONE  (OXY IR/ROXICODONE ) 5 MG immediate release tablet Take 1-2 tablets (5-10 mg total) by mouth every 4 (four) hours as needed for severe pain (pain score 7-10). 02/24/24  Yes Regalado, Belkys A, MD  pantoprazole  (PROTONIX ) 40 MG tablet Take 1 tablet (40 mg total) by mouth daily. 02/25/24 03/26/24 Yes Regalado, Belkys A, MD  PHENobarbital  (LUMINAL) 64.8 MG tablet Take 1 tablet (64.8 mg total) by mouth 2 (two) times daily. 02/24/24 03/25/24 Yes Regalado, Belkys A, MD  polyethylene glycol (MIRALAX  / GLYCOLAX ) 17 g packet Take 17 g by mouth daily. 02/25/24  Yes Regalado, Belkys A, MD  Tiotropium Bromide  Monohydrate (SPIRIVA  RESPIMAT) 2.5 MCG/ACT AERS Inhale 2 puffs into the lungs daily.   Yes [provider]  traMADol  (ULTRAM ) 50 MG tablet Take 50 mg by mouth every 6 (six) hours as needed for moderate pain (pain score 4-6).   Yes [provider]    Current Facility-Administered Medications  Medication Dose Route Frequency Provider Last Rate Last Admin   acetaminophen  (TYLENOL ) tablet 650 mg  650 mg Oral Q6H PRN Patel, Vishal R, MD       albuterol  (PROVENTIL ) (2.5 MG/3ML) 0.083% nebulizer solution 2.5 mg  2.5 mg Inhalation Q4H PRN Patel, Vishal R, MD       ALPRAZolam  (XANAX ) tablet 0.5 mg   0.5 mg Oral BID Patel, Vishal R, MD       cefTRIAXone  (ROCEPHIN ) 2 g in sodium chloride  0.9 % 100 mL IVPB  2 g Intravenous Q24H Trinidad Funk, RPH   Stopped at 02/28/24 2250   citalopram  (CELEXA ) tablet 20 mg  20 mg Oral Daily Patel, Vishal R, MD       lacosamide  (VIMPAT ) tablet 200 mg  200 mg Oral BID Patel, Vishal R, MD       levothyroxine  (SYNTHROID ) tablet 150 mcg  150 mcg Oral QAC breakfast Patel, Vishal R, MD       metoprolol  tartrate (LOPRESSOR ) tablet 25 mg  25 mg Oral BID Patel, Vishal R, MD       metroNIDAZOLE  (FLAGYL ) IVPB 500 mg  500 mg Intravenous Q12H Trinidad Funk, RPH   Stopped at 02/28/24 2250   ondansetron  (ZOFRAN ) tablet 4 mg  4 mg Oral Q6H PRN Kenny Peals, MD  Or   ondansetron  (ZOFRAN ) injection 4 mg  4 mg Intravenous Q6H PRN Patel, Vishal R, MD       PHENobarbital  (LUMINAL) tablet 64.8 mg  64.8 mg Oral BID Patel, Vishal R, MD       potassium chloride  (KLOR-CON ) packet 40 mEq  40 mEq Oral Once Abbe Abate, MD       potassium chloride  10 mEq in 100 mL IVPB  10 mEq Intravenous Q1 Hr x 4 McClung, Lorrayne Rosier, MD       prochlorperazine  (COMPAZINE ) injection 10 mg  10 mg Intravenous Once Patel, Vishal R, MD       sodium chloride  flush (NS) 0.9 % injection 3 mL  3 mL Intravenous Q12H Patel, Vishal R, MD       umeclidinium bromide  (INCRUSE ELLIPTA ) 62.5 MCG/ACT 1 puff  1 puff Inhalation Daily Reome, Earle J, RPH       Current Outpatient Medications  Medication Sig Dispense Refill   acetaminophen  (TYLENOL ) 500 MG tablet Take 1 tablet (500 mg total) by mouth 3 (three) times daily. 30 tablet 0   albuterol  (PROVENTIL  HFA;VENTOLIN  HFA) 108 (90 BASE) MCG/ACT inhaler Inhale 2 puffs into the lungs every 4 (four) hours as needed for wheezing or shortness of breath.     ALPRAZolam  (XANAX ) 0.5 MG tablet Take 1 tablet (0.5 mg total) by mouth 2 (two) times daily. 30 tablet 0   Ascorbic Acid (VITAMIN C PO) Take 1 tablet by mouth daily.     Calcium  Carb-Cholecalciferol (CALCIUM   HIGH POTENCY/VITAMIN D) 600-5 MG-MCG TABS Take 1 tablet by mouth daily.     citalopram  (CELEXA ) 20 MG tablet Take 1 tablet (20 mg total) by mouth daily. 30 tablet 0   enoxaparin  (LOVENOX ) 40 MG/0.4ML injection Inject 40 mg into the skin daily.     guaiFENesin  (ROBITUSSIN) 100 MG/5ML liquid Take 15 mLs by mouth every 6 (six) hours. 120 mL 0   lacosamide  (VIMPAT ) 200 MG TABS tablet Take 1 tablet (200 mg total) by mouth 2 (two) times daily. 60 tablet 0   levothyroxine  (SYNTHROID , LEVOTHROID) 150 MCG tablet Take 150 mcg by mouth daily before breakfast.      metFORMIN (GLUCOPHAGE-XR) 500 MG 24 hr tablet Take 500 mg by mouth in the morning and at bedtime.     metoprolol  tartrate (LOPRESSOR ) 25 MG tablet Take 1 tablet (25 mg total) by mouth 2 (two) times daily. 60 tablet 0   Multiple Vitamin (MULTIVITAMIN WITH MINERALS) TABS tablet Take 1 tablet by mouth daily. 30 tablet 0   nicotine  (NICODERM CQ  - DOSED IN MG/24 HOURS) 21 mg/24hr patch Place 1 patch (21 mg total) onto the skin daily. 28 patch 0   omeprazole (PRILOSEC) 20 MG capsule Take 20 mg by mouth daily.     oxyCODONE  (OXY IR/ROXICODONE ) 5 MG immediate release tablet Take 1-2 tablets (5-10 mg total) by mouth every 4 (four) hours as needed for severe pain (pain score 7-10). 30 tablet 0   pantoprazole  (PROTONIX ) 40 MG tablet Take 1 tablet (40 mg total) by mouth daily. 30 tablet 0   PHENobarbital  (LUMINAL) 64.8 MG tablet Take 1 tablet (64.8 mg total) by mouth 2 (two) times daily. 60 tablet 0   polyethylene glycol (MIRALAX  / GLYCOLAX ) 17 g packet Take 17 g by mouth daily. 14 each 0   Tiotropium Bromide  Monohydrate (SPIRIVA  RESPIMAT) 2.5 MCG/ACT AERS Inhale 2 puffs into the lungs daily.     traMADol  (ULTRAM ) 50 MG tablet Take 50 mg by mouth  every 6 (six) hours as needed for moderate pain (pain score 4-6).      Allergies as of 02/28/2024 - Review Complete 02/28/2024  Allergen Reaction Noted   Levetiracetam  Other (See Comments) 01/22/2016   Tegretol  [carbamazepine] Anxiety and Other (See Comments) 03/07/2015   Penicillins Other (See Comments) 04/02/2012   Topiramate Other (See Comments) 03/07/2015    Family History  Problem Relation Age of Onset   Stroke Mother    Heart disease Father    Thyroid  disease Father    Thyroid  disease Brother     Social History   Socioeconomic History   Marital status: Married    Spouse name: suzanne    Number of children: 2   Years of education: GED   Highest education level: Not on file  Occupational History    Comment: Disabled  Tobacco Use   Smoking status: Every Day    Types: E-cigarettes, Cigarettes   Smokeless tobacco: Never   Tobacco comments:    11/01/2015 "did smoke 3 ppd; he's been vaping for 4 years; no nicotine  for the past year; smoked a cigarette 2 days ago"  Substance and Sexual Activity   Alcohol use: No    Alcohol/week: 0.0 standard drinks of alcohol   Drug use: No   Sexual activity: Not on file  Other Topics Concern   Not on file  Social History Narrative   Patient lives at home with his wife [name]).    Disabled.   Education GED.   Left handed.   Caffeine sweet tea daily and diet coke.   Social Drivers of Corporate investment banker Strain: Low Risk  (12/31/2022)   Received from [facility]   Overall Financial Resource Strain (CARDIA)    Difficulty of Paying Living Expenses: Not hard at all  Food Insecurity: Patient Unable To Answer (02/02/2024)   Hunger Vital Sign    Worried About Programme researcher, broadcasting/film/video in the Last Year: Patient unable to answer    Ran Out of Food in the Last Year: Patient unable to answer  Transportation Needs: Patient Unable To Answer (02/02/2024)   PRAPARE - Transportation    Lack of Transportation (Medical): Patient unable to answer    Lack of Transportation (Non-Medical): Patient unable to answer  Physical Activity: Unknown (03/27/2023)   Received from [facility]   Exercise Vital Sign    Days of Exercise per Week: 0 days    Minutes of  Exercise per Session: Not on file  Stress: Stress Concern Present (03/27/2023)   Received from [facility] - Occupational Stress Questionnaire    Feeling of Stress : To some extent  Social Connections: Somewhat Isolated (03/27/2023)   Received from [facility]   Social Network    How would you rate your social network (family, work, friends)?: Restricted participation with some degree of social isolation  Intimate Partner Violence: Unknown (02/02/2024)   Humiliation, Afraid, Rape, and Kick questionnaire    Fear of Current or Ex-Partner: Patient unable to answer    Emotionally Abused: Patient unable to answer    Physically Abused: Not on file    Sexually Abused: Patient unable to answer    Review of Systems: Pertinent positive and negative review of systems were noted in the above HPI section.  All other review of systems was otherwise negative.   Physical Exam: Vital signs in last 24 hours: Temp:  [98 F (36.7 C)-98.5 F (36.9 C)] 98 F (36.7  C) (06/07 0346) Pulse Rate:  [94-129] 101 (06/07 0700) Resp:  [11-26] 18 (06/07 0700) BP: (116-171)/(54-100) 161/91 (06/07 0700) SpO2:  [94 %-100 %] 100 % (06/07 0700) Weight:  [83 kg] 83 kg (06/06 1445)   General:   Alert,  Well-developed, chronically ill-appearing older white male, complaining of a headache and some nausea, affect flat Head:  Normocephalic and atraumatic. Eyes:  Sclera clear, no icterus.   Conjunctiva pink. Ears:  Normal auditory acuity. Nose:  No deformity, discharge,  or lesions. Mouth:  No deformity or lesions.   Neck:  Supple; no masses or thyromegaly. Lungs:  Clear throughout to auscultation.   No wheezes, crackles, or rhonchi.  Heart:  Regular rate and rhythm; no murmurs, clicks, rubs,  or gallops. Abdomen:  Soft, bowel sounds are present, there is tenderness in the left mid left lower quadrant no guarding or rebound, no palpable mass or hepatosplenomegaly Rectal: Not  done Msk:  Symmetrical without gross deformities. . Pulses:  Normal pulses noted. Extremities:  Without clubbing or edema. Neurologic:  Alert and  oriented x4;  grossly normal neurologically. Skin:  Intact without significant lesions or rashes.. Psych:  Alert and cooperative.  Affect flat  Intake/Output from previous day: 06/06 0701 - 06/07 0700 In: 1000 [IV Piggyback:1000] Out: -  Intake/Output this shift: No intake/output data recorded.  Lab Results: Recent Labs    02/28/24 1531 02/29/24 0510  WBC 13.7* 13.2*  HGB 12.2* 11.3*  HCT 36.3* 34.4*  PLT 286 272   BMET Recent Labs    02/28/24 1531 02/29/24 0510  NA 137 136  K 3.4* 2.9*  CL 101 105  CO2 21* 21*  GLUCOSE 149* 151*  BUN 23 18  CREATININE 0.60* 0.46*  CALCIUM  9.6 8.7*   LFT Recent Labs    02/28/24 1531  PROT 7.4  ALBUMIN 3.6  AST 37  ALT 55*  ALKPHOS 97  BILITOT 0.9   PT/INR Recent Labs    02/28/24 1531  LABPROT 14.4  INR 1.1     IMPRESSION:  #4 63 year old white male recently discharged from the hospital on 02/21/2024 after he had sustained a subdural hematoma related to a fall, had complication of seizure activity and required right sided bur hole evacuation on 02/18/2024  #2 diagnosed with DVT right upper extremity during admission and discharged on Lovenox  #3 COPD on chronic home O2 3 L  #4 acute onset of lower abdominal pain last evening while eating dinner followed by bloody bowel movement Workup here with leukocytosis with WBC 13.7, initial hemoglobin 12.2 CT angio revealed a left-sided colitis inflammatory versus infectious  He has not had any further active bleeding or diarrhea, continues to complain of lower abdominal pain-query if this may have been a segmental ischemic colitis  GI path panel ordered and pending IV metronidazole  and Rocephin  started  Plan; full liquid diet Await GI path panel Continue metronidazole  and Rocephin  Holding Lovenox  today Trend hemoglobin every  6 hours and transfuse as indicated GI will follow with you   Hanako Tipping PA-C 02/29/2024, 8:53 AM

## 2024-02-29 NOTE — Progress Notes (Signed)
 Pt refused all PO meds. Educated pt on meds usage/need. Pt verbalized understanding and still refused. Care ongoing  0045 pt agreed to take night meds

## 2024-02-29 NOTE — ED Notes (Signed)
 Cardiac monitoring in progress. Spo2 98% on RA c/o headache notified provider. Linens changed at this time. Safety and comfort maintained. Frequent checks in place

## 2024-03-01 DIAGNOSIS — K922 Gastrointestinal hemorrhage, unspecified: Secondary | ICD-10-CM | POA: Diagnosis not present

## 2024-03-01 LAB — BASIC METABOLIC PANEL WITH GFR
Anion gap: 10 (ref 5–15)
BUN: 11 mg/dL (ref 8–23)
CO2: 19 mmol/L — ABNORMAL LOW (ref 22–32)
Calcium: 7.8 mg/dL — ABNORMAL LOW (ref 8.9–10.3)
Chloride: 102 mmol/L (ref 98–111)
Creatinine, Ser: 0.48 mg/dL — ABNORMAL LOW (ref 0.61–1.24)
GFR, Estimated: >60 mL/min (ref >60)
Glucose, Bld: 141 mg/dL — ABNORMAL HIGH (ref 70–99)
Potassium: 2.9 mmol/L — ABNORMAL LOW (ref 3.5–5.1)
Sodium: 131 mmol/L — ABNORMAL LOW (ref 135–145)

## 2024-03-01 LAB — CBC
HCT: 32.8 % — ABNORMAL LOW (ref 39.0–52.0)
Hemoglobin: 10.6 g/dL — ABNORMAL LOW (ref 13.0–17.0)
MCH: 32.8 pg (ref 26.0–34.0)
MCHC: 32.3 g/dL (ref 30.0–36.0)
MCV: 101.5 fL — ABNORMAL HIGH (ref 80.0–100.0)
Platelets: 262 10*3/uL (ref 150–400)
RBC: 3.23 MIL/uL — ABNORMAL LOW (ref 4.22–5.81)
RDW: 17.4 % — ABNORMAL HIGH (ref 11.5–15.5)
WBC: 11 10*3/uL — ABNORMAL HIGH (ref 4.0–10.5)
nRBC: 0 % (ref 0.0–0.2)

## 2024-03-01 LAB — MAGNESIUM: Magnesium: 1.7 mg/dL (ref 1.7–2.4)

## 2024-03-01 MED ORDER — MAGNESIUM SULFATE 2 GM/50ML IV SOLN
INTRAVENOUS | Status: AC
  Filled 2024-03-01: qty 50

## 2024-03-01 MED ORDER — ENOXAPARIN SODIUM 80 MG/0.8ML IJ SOSY
80.0000 mg | PREFILLED_SYRINGE | Freq: Two times a day (BID) | INTRAMUSCULAR | Status: DC
  Administered 2024-03-01: 80 mg via SUBCUTANEOUS
  Filled 2024-03-01: qty 0.8

## 2024-03-01 MED ORDER — SODIUM CHLORIDE 0.9% FLUSH
10.0000 mL | Freq: Two times a day (BID) | INTRAVENOUS | Status: DC
  Administered 2024-03-02 – 2024-03-08 (×12): 10 mL

## 2024-03-01 MED ORDER — POTASSIUM CHLORIDE 20 MEQ PO PACK
40.0000 meq | PACK | Freq: Two times a day (BID) | ORAL | Status: DC
  Administered 2024-03-01 – 2024-03-08 (×13): 40 meq via ORAL
  Filled 2024-03-01 (×15): qty 2

## 2024-03-01 MED ORDER — MAGNESIUM SULFATE 2 GM/50ML IV SOLN
2.0000 g | Freq: Once | INTRAVENOUS | Status: AC
  Administered 2024-03-01: 2 g via INTRAVENOUS
  Filled 2024-03-01: qty 50

## 2024-03-01 MED ORDER — CHLORHEXIDINE GLUCONATE CLOTH 2 % EX PADS
6.0000 | MEDICATED_PAD | Freq: Every day | CUTANEOUS | Status: DC
  Administered 2024-03-02 – 2024-03-08 (×7): 6 via TOPICAL

## 2024-03-01 MED ORDER — ENOXAPARIN SODIUM 40 MG/0.4ML IJ SOSY
40.0000 mg | PREFILLED_SYRINGE | INTRAMUSCULAR | Status: DC
  Administered 2024-03-02 – 2024-03-08 (×7): 40 mg via SUBCUTANEOUS
  Filled 2024-03-01 (×7): qty 0.4

## 2024-03-01 NOTE — Progress Notes (Signed)

## 2024-03-01 NOTE — Progress Notes (Signed)
 PHARMACY - ANTICOAGULATION CONSULT NOTE  Pharmacy Consult for enoxaparin  Indication: recent DVT  Allergies  Allergen Reactions   Levetiracetam  Other (See Comments)    Severe anxiety, agitation, hyperactivity, very negative disposition   Tegretol [Carbamazepine] Anxiety and Other (See Comments)    Increased seizures/ Induces seizures   Penicillins Other (See Comments)    Reaction not noted   Topiramate Other (See Comments)    Reaction not noted    Patient Measurements: Height: 5\' 6"  (167.6 cm) Weight: 83 kg (182 lb 15.7 oz) IBW/kg (Calculated) : 63.8 HEPARIN  DW (KG): 80.7  Vital Signs: Temp: 98 F (36.7 C) (06/08 0837) BP: 159/81 (06/08 0837) Pulse Rate: 81 (06/08 0837)  Labs: Recent Labs    02/28/24 1531 02/29/24 0510 02/29/24 1243 03/01/24 0701  HGB 12.2* 11.3* 10.3* 10.6*  HCT 36.3* 34.4* 30.6* 32.8*  PLT 286 272 251 262  LABPROT 14.4  --   --   --   INR 1.1  --   --   --   CREATININE 0.60* 0.46*  --  0.48*    Estimated Creatinine Clearance: 93.1 mL/min (A) (by C-G formula based on SCr of 0.48 mg/dL (L)).   Medical History: Past Medical History:  Diagnosis Date   Anemia    Depression    Diabetes mellitus without complication (HCC)    Emphysema of lung (HCC)    GERD (gastroesophageal reflux disease)    Hiatal hernia    Hyperlipidemia    Hypertension    Memory loss    Seizures (HCC)    Thyroid  disease     Medications:  Scheduled:   ALPRAZolam   0.5 mg Oral BID   citalopram   20 mg Oral Daily   lacosamide   200 mg Oral BID   levothyroxine   150 mcg Oral QAC breakfast   metoprolol  tartrate  25 mg Oral BID   PHENobarbital   64.8 mg Oral BID   potassium chloride   40 mEq Oral BID   sodium chloride  flush  3 mL Intravenous Q12H   umeclidinium bromide   1 puff Inhalation Daily   Infusions:   magnesium  sulfate     cefTRIAXone  (ROCEPHIN )  IV 2 g (02/29/24 2202)   magnesium  sulfate bolus IVPB 2 g (03/01/24 1047)   metronidazole  500 mg (03/01/24 0816)     Assessment: 63 yo M presenting with an acute lower GI bleed. PMH significant for recent traumatic right SDH s/p burr holes (02/18/24), RUE DVT. Not on anticoagulation PTA, set to start enoxaparin  on 6/6 per med rec. Pharmacy consulted for therapeutic enoxaparin  dosing.  Hgb 10.6 is stable, Plts 262 are stable. Okay to start enoxaparin  per GI with continued monitoring for rectal bleeding.   Goal of Therapy:  Anti-Xa level 0.6-1 units/ml 4hrs after LMWH dose given Monitor platelets by anticoagulation protocol: Yes   Plan:  Start enoxaparin  80 mg subcutaneously q12h  Monitor CBC and for signs/symptoms of bleeding   Valarie Garner, PharmD PGY1 Pharmacy Resident  Please check AMION for all [facility] Pharmacy phone numbers After 10:00 PM, call Main Pharmacy [phone] 03/01/2024,11:33 AM

## 2024-03-01 NOTE — Progress Notes (Signed)
 Progress Note   Subjective  Continues to have pain and diarrhea but no further bleeding. Stool studies not sent, have been difficult to collect per patient and wife. Wife is in room, states the patient had a colonoscopy 3 years ago at [facility] she believes and had some polyps removed.    Objective   Vital signs in last 24 hours: Temp:  [97.7 F (36.5 C)-98.3 F (36.8 C)] 98 F (36.7 C) (06/08 0837) Pulse Rate:  [73-92] 81 (06/08 0837) Resp:  [18-21] 18 (06/08 0837) BP: (123-160)/(65-85) 159/81 (06/08 0837) SpO2:  [94 %-100 %] 94 % (06/08 0837) Last BM Date : 02/29/24 General:    white male in NAD Abd - soft, mild tenderness Psych:  Cooperative. Normal mood and affect.  Intake/Output from previous day: 06/07 0701 - 06/08 0700 In: 488.1 [I.V.:151.9; IV Piggyback:336.2] Out: -  Intake/Output this shift: No intake/output data recorded.  Lab Results: Recent Labs    02/29/24 0510 02/29/24 1243 03/01/24 0701  WBC 13.2* 14.2* 11.0*  HGB 11.3* 10.3* 10.6*  HCT 34.4* 30.6* 32.8*  PLT 272 251 262   BMET Recent Labs    02/28/24 1531 02/29/24 0510 03/01/24 0701  NA 137 136 131*  K 3.4* 2.9* 2.9*  CL 101 105 102  CO2 21* 21* 19*  GLUCOSE 149* 151* 141*  BUN 23 18 11   CREATININE 0.60* 0.46* 0.48*  CALCIUM  9.6 8.7* 7.8*   LFT Recent Labs    02/28/24 1531  PROT 7.4  ALBUMIN 3.6  AST 37  ALT 55*  ALKPHOS 97  BILITOT 0.9   PT/INR Recent Labs    02/28/24 1531  LABPROT 14.4  INR 1.1    Studies/Results: CT HEAD WO CONTRAST ( ) Result Date: 02/28/2024 CLINICAL DATA:  Headache, no red flags s/p craniotomy with headache EXAM: CT HEAD WITHOUT CONTRAST TECHNIQUE: Contiguous axial images were obtained from the base of the skull through the vertex without intravenous contrast. RADIATION DOSE REDUCTION: This exam was performed according to the departmental dose-optimization program which includes automated exposure control, adjustment of the mA and/or kV according  to patient size and/or use of iterative reconstruction technique. COMPARISON:  02/24/2024 FINDINGS: Brain: Postoperative changes on the right. Low-density subdural hematoma again noted measuring 8 mm in thickness, stable. No midline shift. No new areas of hemorrhage. No hydrocephalus or acute infarction. Previously seen blood layering in the left occipital horn of the lateral ventricle no longer visualized. Vascular: No hyperdense vessel or unexpected calcification. Skull: No acute calvarial abnormality. Postoperative changes in the right calvarium. Sinuses/Orbits: No acute findings Other: None IMPRESSION: 8 mm low-density subdural hematoma over the right cerebral hemisphere is similar to prior study. No new areas of hemorrhage. Electronically Signed   By: Janeece Mechanic M.D.   On: 02/28/2024 20:00   CT Angio Abd/Pel W and/or Wo Contrast Result Date: 02/28/2024 CLINICAL DATA:  Lower GI bleed EXAM: CTA ABDOMEN AND PELVIS WITHOUT AND WITH CONTRAST TECHNIQUE: Multidetector CT imaging of the abdomen and pelvis was performed using the standard protocol during bolus administration of intravenous contrast. Multiplanar reconstructed images and MIPs were obtained and reviewed to evaluate the vascular anatomy. RADIATION DOSE REDUCTION: This exam was performed according to the departmental dose-optimization program which includes automated exposure control, adjustment of the mA and/or kV according to patient size and/or use of iterative reconstruction technique. CONTRAST:  70mL OMNIPAQUE  IOHEXOL  350 MG/ML SOLN COMPARISON:  09/06/2023 FINDINGS: VASCULAR Aorta: Irregular calcified and noncalcified plaque throughout the aorta. No  aneurysm or dissection. Celiac: Patent without evidence of aneurysm, dissection, vasculitis or significant stenosis. SMA: Patent without evidence of aneurysm, dissection, vasculitis or significant stenosis. Renals: Both renal arteries are patent without evidence of aneurysm, dissection, vasculitis,  fibromuscular dysplasia or significant stenosis. IMA: Patent without evidence of aneurysm, dissection, vasculitis or significant stenosis. Inflow: Calcified plaque.  No aneurysm or dissection. Proximal Outflow: Calcifications.  No aneurysm or dissection. Veins: No obvious venous abnormality within the limitations of this arterial phase study. Review of the MIP images confirms the above findings. NON-VASCULAR Lower chest: No acute findings Hepatobiliary: No focal hepatic abnormality. Gallbladder unremarkable. Pancreas: No focal abnormality or ductal dilatation. Spleen: No focal abnormality.  Normal size. Adrenals/Urinary Tract: No adrenal abnormality. No focal renal abnormality. No stones or hydronephrosis. Urinary bladder is unremarkable. Stomach/Bowel: There is wall thickening and surrounding inflammation involving the descending colon and sigmoid colon compatible with infectious or inflammatory colitis. No active extravasation of contrast. Stomach and small bowel decompressed, unremarkable. Lymphatic: No adenopathy Reproductive: No visible focal abnormality. Other: No free fluid or free air. Musculoskeletal: No acute bony abnormality. The stranding noted in the lateral left hip soft tissues, possibly hematoma. Recommend clinical correlation for recent trauma. IMPRESSION: VASCULAR Irregular calcified and noncalcified plaque throughout the aorta. No aneurysm or dissection. No active extravasation of contrast. NON-VASCULAR Circumferential wall thickening involving the descending colon and sigmoid colon compatible with infectious or inflammatory colitis. Stranding in the subcutaneous soft tissues in the left lateral hip region, possibly hematoma. Recommend clinical correlation for recent trauma/injury in the area. Electronically Signed   By: Janeece Mechanic M.D.   On: 02/28/2024 19:57       Assessment / Plan:    63 y/o male here with the following:  Colitis - suspect ischemic colitis vs. Infectious Abdominal  pain Recent DVT - was on lovenox   Still having diarrhea, stool studies pending and not collected yet. Spoke with nursing staff to assist with this, needs to be ruled out for C Diff given his length of stay at rehab / hospitalization, and GI pathogen panel sent. If these are negative assuming he has ischemic colitis. Continuing empiric antibiotics. Wife states he had a colonoscopy 3 years ago at [facility], from looking at chart I see this was done in 2016. Given this occurrence should have a colonoscopy upon discharge with his primary GI MD at [facility], I don't think he can handle that right now with his other medical problems. Wife and patient agree, wish to do outpatient colonoscopy.   For now, please obtain pending stool studies, monitor for recurrent bleeding (none reported), can resume lovenox  for his DVT and monitor for bleeding. He was constipated leading up to this, that can increase risk for ischemic colitis. Currently having diarrhea however. If he has any persistent or recurrent bleeding can do colonoscopy while inpatient, hopefully that is not needed.  If this is ischemic colitis, suspect will improve with more time.  He can follow up with his primary GI MD upon discharge at [facility] to coordinate colonoscopy.  We will sign off for now, please call with questions in the interim.  Christi Coward, MD [facility]

## 2024-03-01 NOTE — Progress Notes (Signed)
 [name]  JYN:[id number] DOB: 1961-07-09 DOA: 02/28/2024 PCP: Claudell Cruz, MD    Brief Narrative:  63 year old with a history of COPD with tobacco abuse, chronic hypoxia requiring 3 L nasal cannula with activity, HTN, hypothyroidism, depression/anxiety, right upper extremity DVT on Lovenox , and a recent traumatic right subdural hematoma complicated by seizure activity requiring right sided bur hole for evacuation 02/18/2024 (D/C to SNF 02/24/24) who was transported to the ER from his SNF with rectal bleeding.  He was enjoying a meal at the SNF when he developed the acute onset of left lower quadrant abdominal pain followed by an episode of bright red blood per rectum.  In the ER his blood pressure was stable.  Hemoglobin was found to be 12.2.  He was guaiac positive.  INR was 1.1.  CT of the abdomen/pelvis noted circumferential wall thickening of the descending colon and sigmoid colon compatible with infectious or inflammatory colitis.  CT head revealed a stable right sided SDH.  Goals of Care:   Code Status: Full Code   DVT prophylaxis: SCDs Start: 02/28/24 2226   Interim Hx: The patient refused his oral medications initially last night, but was eventually able to be convinced to take them.  Otherwise no acute events were reported overnight.  He is afebrile.  Vital signs are stable.  The patient himself tells me he feels bad in general but cannot be more specific.  His wife is at bedside and feels that he is slowly improving.  Assessment & Plan:  BRBPR Likely due to combination of Lovenox  and acute ischemic colitis - hemoglobin appears stable at this time - resume anticoagulation today  Acute colitis of descending and sigmoid colon -likely ischemic in nature As noted on CT abdomen/pelvis -on empiric antibiotic coverage for now -GI following and feels CT findings are most consistent with an ischemic etiology -will discontinue antibiotics if GI pathogen panel negative -GI has recommended an  eventual colonoscopy in the outpatient setting once patient has had enough time to fully recover from his acute illnesses  Traumatic subdural hematoma requiring right sided bur hole for evacuation 02/18/2024 Follow-up CT head notes stable 8 mm low-density right cerebral SDH  Right upper extremity DVT related to PICC line PICC line was removed when the DVT was noted during recent hospital stay - Lovenox  was being used for anticoagulation but was held at time of admission due to above - resume Lovenox  today and monitor  Seizure disorder Due to above - continue Vimpat  and phenobarbital   COPD with chronic hypoxic respiratory failure Requires 3 L O2 with activity at baseline -stable at present -continue usual meds  HTN Continue Lopressor  -monitor trend without change today  Severe hypokalemia Increase supplementation and monitor  Hypomagnesemia Continue to supplement to goal of 2.0  Hypothyroidism Continue usual Synthroid  dose  Depression/anxiety Continue usual Celexa  and Xanax  doses   Family Communication: Spoke with wife at bedside at length but Disposition: Eventual return to rehab facility   Objective: Blood pressure (!) 160/85, pulse 89, temperature 98.3 F (36.8 C), resp. rate 19, height 5\' 6"  (1.676 m), weight 83 kg, SpO2 100%.  Intake/Output Summary (Last 24 hours) at 03/01/2024 0819 Last data filed at 02/29/2024 1759 Gross per 24 hour  Intake 488.06 ml  Output --  Net 488.06 ml   Filed Weights   02/28/24 1445  Weight: 83 kg    Examination: General: No acute respiratory distress -dressing and what appears to be staples remain intact on the right scalp wound  Lungs: Clear to auscultation bilaterally without wheezes or crackles Cardiovascular: Regular rate and rhythm without murmur gallop or rub normal S1 and S2 Abdomen: Nontender, nondistended, soft, bowel sounds positive, no rebound, no ascites, no appreciable mass Extremities: No significant cyanosis, clubbing, or  edema bilateral lower extremities  CBC: Recent Labs  Lab 02/28/24 1531 02/29/24 0510 02/29/24 1243 03/01/24 0701  WBC 13.7* 13.2* 14.2* 11.0*  NEUTROABS 11.2*  --   --   --   HGB 12.2* 11.3* 10.3* 10.6*  HCT 36.3* 34.4* 30.6* 32.8*  MCV 101.4* 103.9* 101.0* 101.5*  PLT 286 272 251 262   Basic Metabolic Panel: Recent Labs  Lab 02/28/24 1531 02/29/24 0510 03/01/24 0701  NA 137 136 131*  K 3.4* 2.9* 2.9*  CL 101 105 102  CO2 21* 21* 19*  GLUCOSE 149* 151* 141*  BUN 23 18 11   CREATININE 0.60* 0.46* 0.48*  CALCIUM  9.6 8.7* 7.8*  MG  --  1.4* 1.7   GFR: Estimated Creatinine Clearance: 93.1 mL/min (A) (by C-G formula based on SCr of 0.48 mg/dL (L)).   Scheduled Meds:  ALPRAZolam   0.5 mg Oral BID   citalopram   20 mg Oral Daily   lacosamide   200 mg Oral BID   levothyroxine   150 mcg Oral QAC breakfast   metoprolol  tartrate  25 mg Oral BID   PHENobarbital   64.8 mg Oral BID   prochlorperazine   10 mg Intravenous Once   sodium chloride  flush  3 mL Intravenous Q12H   umeclidinium bromide   1 puff Inhalation Daily   Continuous Infusions:  cefTRIAXone  (ROCEPHIN )  IV 2 g (02/29/24 2202)   metronidazole  500 mg (02/29/24 2203)     LOS: 2 days   Abbe Abate, MD [facility] Office  [phone] Pager - Text Page per Tilford Foley  If 7PM-7AM, please contact night-coverage per Amion 03/01/2024, 8:19 AM

## 2024-03-01 NOTE — Plan of Care (Signed)

## 2024-03-02 ENCOUNTER — Inpatient Hospital Stay (HOSPITAL_COMMUNITY): Payer: Medicare (Managed Care)

## 2024-03-02 DIAGNOSIS — I82403 Acute embolism and thrombosis of unspecified deep veins of lower extremity, bilateral: Secondary | ICD-10-CM | POA: Diagnosis not present

## 2024-03-02 DIAGNOSIS — K922 Gastrointestinal hemorrhage, unspecified: Secondary | ICD-10-CM | POA: Diagnosis not present

## 2024-03-02 LAB — BASIC METABOLIC PANEL WITH GFR
Anion gap: 8 (ref 5–15)
BUN: 6 mg/dL — ABNORMAL LOW (ref 8–23)
CO2: 22 mmol/L (ref 22–32)
Calcium: 8.2 mg/dL — ABNORMAL LOW (ref 8.9–10.3)
Chloride: 106 mmol/L (ref 98–111)
Creatinine, Ser: 0.62 mg/dL (ref 0.61–1.24)
GFR, Estimated: >60 mL/min (ref >60)
Glucose, Bld: 122 mg/dL — ABNORMAL HIGH (ref 70–99)
Potassium: 3.1 mmol/L — ABNORMAL LOW (ref 3.5–5.1)
Sodium: 136 mmol/L (ref 135–145)

## 2024-03-02 LAB — CBC
HCT: 28.7 % — ABNORMAL LOW (ref 39.0–52.0)
Hemoglobin: 9.2 g/dL — ABNORMAL LOW (ref 13.0–17.0)
MCH: 33.1 pg (ref 26.0–34.0)
MCHC: 32.1 g/dL (ref 30.0–36.0)
MCV: 103.2 fL — ABNORMAL HIGH (ref 80.0–100.0)
Platelets: 292 10*3/uL (ref 150–400)
RBC: 2.78 MIL/uL — ABNORMAL LOW (ref 4.22–5.81)
RDW: 17.3 % — ABNORMAL HIGH (ref 11.5–15.5)
WBC: 10.9 10*3/uL — ABNORMAL HIGH (ref 4.0–10.5)
nRBC: 0 % (ref 0.0–0.2)

## 2024-03-02 LAB — MAGNESIUM: Magnesium: 1.7 mg/dL (ref 1.7–2.4)

## 2024-03-02 MED ORDER — MAGNESIUM SULFATE 2 GM/50ML IV SOLN
2.0000 g | Freq: Once | INTRAVENOUS | Status: AC
  Administered 2024-03-02: 2 g via INTRAVENOUS
  Filled 2024-03-02: qty 50

## 2024-03-02 NOTE — Progress Notes (Signed)
 Right upper extremity venous duplex has been completed. Preliminary results can be found in CV Proc through chart review.   03/02/24 4:08 PM Birda Buffy RVT

## 2024-03-02 NOTE — Progress Notes (Signed)
 [name]  WGN:[id number] DOB: 04/06/1961 DOA: 02/28/2024 PCP: Claudell Cruz, MD    Brief Narrative:  63 year old with a history of COPD with tobacco abuse, chronic hypoxia requiring 3 L nasal cannula with activity, HTN, hypothyroidism, depression/anxiety, right upper extremity DVT on Lovenox , and a recent traumatic right subdural hematoma complicated by seizure activity requiring right sided bur hole for evacuation 02/18/2024 (D/C to SNF 02/24/24) who was transported to the ER from his SNF with rectal bleeding.  He was enjoying a meal at the SNF when he developed the acute onset of left lower quadrant abdominal pain followed by an episode of bright red blood per rectum.  In the ER his blood pressure was stable.  Hemoglobin was found to be 12.2.  He was guaiac positive.  INR was 1.1.  CT of the abdomen/pelvis noted circumferential wall thickening of the descending colon and sigmoid colon compatible with infectious or inflammatory colitis.  CT head revealed a stable right sided SDH.  Goals of Care:   Code Status: Full Code   DVT prophylaxis: enoxaparin  (LOVENOX ) injection 40 mg Start: 03/02/24 1200 SCDs Start: 02/28/24 2226   Interim Hx: No acute events reported overnight.  Afebrile.  Vital signs stable.  Potassium improved but not yet at goal.  Hemoglobin declining with hydration but trend is stable without evidence of gross blood loss.  The patient tells me he continues to have abdominal pain, but states that his diarrhea has resolved.  He has a low-grade headache which he reports is essentially continuous.  Assessment & Plan:  BRBPR Likely due to combination of Lovenox  and acute ischemic colitis - hemoglobin appears stable at this time - resumed prophylactic dose Lovenox  6/8  Acute colitis of descending and sigmoid colon - likely ischemic in nature As noted on CT abdomen/pelvis -treated with empiric antibiotic coverage initially - GI evaluated and felt CT findings most consistent with an  ischemic etiology -will discontinue antibiotics with resolution of diarrhea - GI has recommended an eventual colonoscopy in the outpatient setting once patient has had enough time to fully recover from his acute illnesses  Traumatic subdural hematoma requiring right sided bur hole for evacuation 02/18/2024 Follow-up CT head notes stable 8 mm low-density right cerebral SDH - operative drainage performed by Dr. [name] 02/18/2024 -will ask Neurosurgery to revisit to attend to his surgical wound (I am unable to find wound recs on thorough review of old records)  Right upper extremity DVT related to PICC line PICC line was removed when the DVT was noted during recent hospital stay - Lovenox  was being used for anticoagulation but was held at time of admission due to above - resumed Lovenox  6/8 at DVT prophy dose as previously utilized - f/u venous duplex pending   Seizure disorder Due to above - continue Vimpat  and phenobarbital   COPD with chronic hypoxic respiratory failure Requires 3 L O2 with activity at baseline - stable at present - continue usual meds  HTN Continue Lopressor  -blood pressure stable presently  Severe hypokalemia Continue to supplement and follow trend  Hypomagnesemia Continue to supplement to goal of 2.0  Hypothyroidism Continue usual Synthroid  dose  Depression/anxiety Continue usual Celexa  and Xanax  doses   Family Communication: No family present at time of exam today Disposition: Eventual return to rehab facility   Objective: Blood pressure (!) 142/73, pulse 79, temperature 98.6 F (37 C), resp. rate 18, height 5\' 6"  (1.676 m), weight 83 kg, SpO2 96%.  Intake/Output Summary (Last 24 hours) at 03/02/2024 0745  Last data filed at 03/01/2024 1854 Gross per 24 hour  Intake 310.78 ml  Output --  Net 310.78 ml   Filed Weights   02/28/24 1445  Weight: 83 kg    Examination: General: No acute respiratory distress - dressing and what appears to be staples remain  intact on the right scalp wound Lungs: Clear to auscultation bilaterally Cardiovascular: Regular rate and rhythm without murmur  Abdomen: Nontender, nondistended, soft, bowel sounds positive, no rebound, no ascites, no appreciable mass Extremities: No significant cyanosis, clubbing, or edema bilateral lower extremities  CBC: Recent Labs  Lab 02/28/24 1531 02/29/24 0510 02/29/24 1243 03/01/24 0701 03/02/24 0427  WBC 13.7*   < > 14.2* 11.0* 10.9*  NEUTROABS 11.2*  --   --   --   --   HGB 12.2*   < > 10.3* 10.6* 9.2*  HCT 36.3*   < > 30.6* 32.8* 28.7*  MCV 101.4*   < > 101.0* 101.5* 103.2*  PLT 286   < > 251 262 292   < > = values in this interval not displayed.   Basic Metabolic Panel: Recent Labs  Lab 02/29/24 0510 03/01/24 0701 03/02/24 0427  NA 136 131* 136  K 2.9* 2.9* 3.1*  CL 105 102 106  CO2 21* 19* 22  GLUCOSE 151* 141* 122*  BUN 18 11 6*  CREATININE 0.46* 0.48* 0.62  CALCIUM  8.7* 7.8* 8.2*  MG 1.4* 1.7 1.7   GFR: Estimated Creatinine Clearance: 93.1 mL/min (by C-G formula based on SCr of 0.62 mg/dL).   Scheduled Meds:  ALPRAZolam   0.5 mg Oral BID   Chlorhexidine  Gluconate Cloth  6 each Topical Daily   citalopram   20 mg Oral Daily   enoxaparin  (LOVENOX ) injection  40 mg Subcutaneous Q24H   lacosamide   200 mg Oral BID   levothyroxine   150 mcg Oral QAC breakfast   metoprolol  tartrate  25 mg Oral BID   PHENobarbital   64.8 mg Oral BID   potassium chloride   40 mEq Oral BID   sodium chloride  flush  10-40 mL Intracatheter Q12H   sodium chloride  flush  3 mL Intravenous Q12H   umeclidinium bromide   1 puff Inhalation Daily   Continuous Infusions:  cefTRIAXone  (ROCEPHIN )  IV 2 g (03/02/24 0051)   metronidazole  500 mg (03/02/24 0052)     LOS: 3 days   Abbe Abate, MD [facility] Office  [phone] Pager - Text Page per Tilford Foley  If 7PM-7AM, please contact night-coverage per Amion 03/02/2024, 7:45 AM

## 2024-03-02 NOTE — Progress Notes (Signed)
 Assume are at 0700, pt lying in bed with eyes open, even unlabored respirations, skin warm and dry to the touch, call bell within reach and safety measures are in place, no signs/symptoms of  blood

## 2024-03-02 NOTE — Plan of Care (Signed)

## 2024-03-03 DIAGNOSIS — K559 Vascular disorder of intestine, unspecified: Secondary | ICD-10-CM

## 2024-03-03 DIAGNOSIS — R109 Unspecified abdominal pain: Secondary | ICD-10-CM | POA: Diagnosis not present

## 2024-03-03 DIAGNOSIS — K529 Noninfective gastroenteritis and colitis, unspecified: Secondary | ICD-10-CM

## 2024-03-03 DIAGNOSIS — K625 Hemorrhage of anus and rectum: Secondary | ICD-10-CM

## 2024-03-03 LAB — BASIC METABOLIC PANEL WITH GFR
Anion gap: 6 (ref 5–15)
BUN: 5 mg/dL — ABNORMAL LOW (ref 8–23)
CO2: 24 mmol/L (ref 22–32)
Calcium: 7.9 mg/dL — ABNORMAL LOW (ref 8.9–10.3)
Chloride: 106 mmol/L (ref 98–111)
Creatinine, Ser: 0.53 mg/dL — ABNORMAL LOW (ref 0.61–1.24)
GFR, Estimated: >60 mL/min (ref >60)
Glucose, Bld: 114 mg/dL — ABNORMAL HIGH (ref 70–99)
Potassium: 3.3 mmol/L — ABNORMAL LOW (ref 3.5–5.1)
Sodium: 136 mmol/L (ref 135–145)

## 2024-03-03 LAB — CBC
HCT: 28.3 % — ABNORMAL LOW (ref 39.0–52.0)
Hemoglobin: 9.2 g/dL — ABNORMAL LOW (ref 13.0–17.0)
MCH: 33.5 pg (ref 26.0–34.0)
MCHC: 32.5 g/dL (ref 30.0–36.0)
MCV: 102.9 fL — ABNORMAL HIGH (ref 80.0–100.0)
Platelets: 306 10*3/uL (ref 150–400)
RBC: 2.75 MIL/uL — ABNORMAL LOW (ref 4.22–5.81)
RDW: 17.2 % — ABNORMAL HIGH (ref 11.5–15.5)
WBC: 9 10*3/uL (ref 4.0–10.5)
nRBC: 0 % (ref 0.0–0.2)

## 2024-03-03 LAB — MAGNESIUM: Magnesium: 1.6 mg/dL — ABNORMAL LOW (ref 1.7–2.4)

## 2024-03-03 MED ORDER — LOPERAMIDE HCL 2 MG PO CAPS: 2.0000 mg | ORAL_CAPSULE | ORAL | Status: DC | PRN

## 2024-03-03 MED ORDER — ENSURE PLUS HIGH PROTEIN PO LIQD
237.0000 mL | Freq: Two times a day (BID) | ORAL | Status: DC
  Administered 2024-03-03 – 2024-03-08 (×9): 237 mL via ORAL

## 2024-03-03 MED ORDER — AMLODIPINE BESYLATE 10 MG PO TABS
10.0000 mg | ORAL_TABLET | Freq: Every day | ORAL | Status: DC
  Administered 2024-03-03 – 2024-03-08 (×6): 10 mg via ORAL
  Filled 2024-03-03 (×6): qty 1

## 2024-03-03 MED ORDER — METOPROLOL TARTRATE 50 MG PO TABS
50.0000 mg | ORAL_TABLET | Freq: Two times a day (BID) | ORAL | Status: DC
  Administered 2024-03-03 – 2024-03-08 (×10): 50 mg via ORAL
  Filled 2024-03-03 (×10): qty 1

## 2024-03-03 MED ORDER — MAGNESIUM SULFATE 4 GM/100ML IV SOLN
4.0000 g | Freq: Once | INTRAVENOUS | Status: AC
  Administered 2024-03-03: 4 g via INTRAVENOUS
  Filled 2024-03-03: qty 100

## 2024-03-03 NOTE — Evaluation (Signed)
 Physical Therapy Evaluation Patient Details Name: [name] MRN: [id number] DOB: 08-13-1961 Today's Date: 03/03/2024  History of Present Illness  Pt is a 63 y.o. M presenting to [facility] ED from SNF with rectal bleeding on 02/29/24. PMH is significant for COPD, chronic hypoxia requiring 3L via [place] w/ activity, HTN, hypothyroidism, depression/axiety, right UE DVT on Lovenox , and recent traumatic right subdural hematoma complicated by seizure activity requiring right sided bur hole for evacuation 02/18/2024   Clinical Impression  Pt presents to evaluation with deficits in mobility, strength, activity tolerance, balance, and pain, all limiting patient's ability to safely mobilize near baseline. Pt was able to stand  w/ AD and moderate physical assistance before requesting to lay back down due to significant dizziness; unable to obtain BP at this time. Pt with notable truncal ataxia in standing. Pt requires physical assistance for all mobility, and heavy verbal cueing for sequencing and task initiation. Pt would benefit from further transfer training. PT will continue to treat patient while he is admitted. Patient will benefit from continued inpatient follow up therapy, <3 hours/day       If plan is discharge home, recommend the following: Two people to help with walking and/or transfers;A lot of help with bathing/dressing/bathroom;Assistance with cooking/housework;Assistance with feeding;Direct supervision/assist for medications management;Direct supervision/assist for financial management;Assist for transportation;Help with stairs or ramp for entrance;Supervision due to cognitive status   Can travel by private vehicle   No    Equipment Recommendations None recommended by PT  Recommendations for Other Services       Functional Status Assessment Patient has had a recent decline in their functional status and demonstrates the ability to make significant improvements in function in a reasonable and  predictable amount of time.     Precautions / Restrictions Precautions Precautions: Fall Recall of Precautions/Restrictions: Intact Restrictions Weight Bearing Restrictions Per Provider Order: No      Mobility  Bed Mobility Overal bed mobility: Needs Assistance Bed Mobility: Supine to Sit, Sit to Supine     Supine to sit: Mod assist, HOB elevated, Used rails Sit to supine: Mod assist, HOB elevated, Used rails   General bed mobility comments: Pt is able to initiate bed mobility but requires mod A for obtaining upright from forearm prop position for sup>sit. For sit>sup pt requries modA for BLE management. VC given for sequencing; increased time to complete.    Transfers Overall transfer level: Needs assistance Equipment used: Rolling walker (2 wheels) Transfers: Sit to/from Stand Sit to Stand: Mod assist           General transfer comment: From EOB w/ RW and mod A. VC given for sequencing. Pt demonstrates significant truncal ataxia while standing. Pt able to maintain standing with CGA and BUE support on RW.    Ambulation/Gait                  Stairs            Wheelchair Mobility     Tilt Bed    Modified Rankin (Stroke Patients Only)       Balance Overall balance assessment: Needs assistance Sitting-balance support: No upper extremity supported, Feet supported Sitting balance-Leahy Scale: Fair Sitting balance - Comments: pt able to sit EOB w/out BUE support for ~1 minute. When balance is challenged via EOB exercises pt demonstrates posterior lean but is able to self correct with cueing. Postural control: Posterior lean Standing balance support: Bilateral upper extremity supported, During functional activity, Reliant on assistive device for balance  Standing balance-Leahy Scale: Poor Standing balance comment: reliant on external device                             Pertinent Vitals/Pain Pain Assessment Pain Assessment: Faces Faces  Pain Scale: Hurts little more Pain Location: abdomen Pain Descriptors / Indicators: Constant, Discomfort, Guarding, Moaning Pain Intervention(s): Limited activity within patient's tolerance, Monitored during session    Home Living Family/patient expects to be discharged to:: Private residence Living Arrangements: Spouse/significant other Available Help at Discharge: Family;Available 24 hours/day Type of Home: House Home Access: Stairs to enter Entrance Stairs-Rails: Doctor, general practice of Steps: 4   Home Layout: One level Home Equipment: Rollator (4 wheels);Cane - single point;BSC/3in1;Standard Environmental consultant;Shower seat Additional Comments: reported via wife    Prior Function Prior Level of Function : Needs assist             Mobility Comments: Per patient's wife, prior to SDH pt ambulating without DME. ADLs Comments: wife states sponge bathing as showering had become too difficiult     Extremity/Trunk Assessment   Upper Extremity Assessment Upper Extremity Assessment: Generalized weakness    Lower Extremity Assessment Lower Extremity Assessment: Generalized weakness    Cervical / Trunk Assessment Cervical / Trunk Assessment: Kyphotic  Communication   Communication Communication: Impaired Factors Affecting Communication: Difficulty expressing self    Cognition Arousal: Alert Behavior During Therapy: Flat affect   PT - Cognitive impairments: Initiation, Sequencing, Safety/Judgement, Problem solving Difficult to assess due to: Impaired communication                     PT - Cognition Comments: pt with difficulty expressing self Following commands: Intact Following commands impaired: Follows one step commands with increased time     Cueing Cueing Techniques: Verbal cues, Visual cues, Tactile cues     General Comments General comments (skin integrity, edema, etc.): pt reported dizziness changing positions from sup>sit, that improved with time.  Dizziness worsened following standing, with pt requesting to lay down. Oxygen read low 90s throughout session.    Exercises General Exercises - Lower Extremity Ankle Circles/Pumps: AROM, Both, 5 reps Long Arc Quad: AROM, Both, 5 reps, Seated Hip Flexion/Marching: AROM, Strengthening, Both, 5 reps, Seated   Assessment/Plan    PT Assessment Patient needs continued PT services  PT Problem List Decreased strength;Decreased range of motion;Decreased activity tolerance;Decreased balance;Decreased mobility;Decreased cognition;Decreased knowledge of use of DME;Decreased safety awareness;Decreased knowledge of precautions;Decreased coordination       PT Treatment Interventions DME instruction;Balance training;Modalities;Gait training;Neuromuscular re-education;Functional mobility training;Therapeutic activities;Therapeutic exercise;Manual techniques;Patient/family education;Cognitive remediation    PT Goals (Current goals can be found in the Care Plan section)  Acute Rehab PT Goals Patient Stated Goal: to go home PT Goal Formulation: With patient/family Time For Goal Achievement: 03/17/24 Potential to Achieve Goals: Fair    Frequency Min 2X/week     Co-evaluation               AM-PAC PT "6 Clicks" Mobility  Outcome Measure Help needed turning from your back to your side while in a flat bed without using bedrails?: A Little Help needed moving from lying on your back to sitting on the side of a flat bed without using bedrails?: A Lot Help needed moving to and from a bed to a chair (including a wheelchair)?: A Lot Help needed standing up from a chair using your arms (e.g., wheelchair or bedside chair)?: A Lot Help needed  to walk in hospital room?: Total Help needed climbing 3-5 steps with a railing? : Total 6 Click Score: 11    End of Session Equipment Utilized During Treatment: Gait belt Activity Tolerance: Other (comment) (dizziness) Patient left: in bed;with call bell/phone  within reach;with bed alarm set;with nursing/sitter in room;with family/visitor present Nurse Communication: Mobility status PT Visit Diagnosis: Unsteadiness on feet (R26.81);Muscle weakness (generalized) (M62.81);History of falling (Z91.81);Difficulty in walking, not elsewhere classified (R26.2);Other symptoms and signs involving the nervous system (R29.898)    Time: 5284-1324 PT Time Calculation (min) (ACUTE ONLY): 23 min   Charges:   PT Evaluation $PT Eval Low Complexity: 1 Low   PT General Charges $$ ACUTE PT VISIT: 1 Visit         Lonell Rives, SPT Acute Rehab [phone]   Lonell Rives 03/03/2024, 2:26 PM

## 2024-03-03 NOTE — Progress Notes (Signed)
 [name]  WUJ:[id number] DOB: 02/17/1961 DOA: 02/28/2024 PCP: Claudell Cruz, MD    Brief Narrative:  63 year old with a history of COPD with tobacco abuse, chronic hypoxia requiring 3 L nasal cannula with activity, HTN, hypothyroidism, depression/anxiety, right upper extremity DVT on low-dose Lovenox , and a recent traumatic right subdural hematoma complicated by seizure activity requiring right sided bur holes for evacuation 02/18/2024 (D/C to SNF 02/24/24) who was transported to the ER from his SNF with rectal bleeding.  He was enjoying a meal at the SNF when he developed the acute onset of left lower quadrant abdominal pain followed by an episode of bright red blood per rectum.  In the ER his blood pressure was stable.  Hemoglobin was found to be 12.2.  He was guaiac positive.  INR was 1.1.  CT of the abdomen/pelvis noted circumferential wall thickening of the descending colon and sigmoid colon compatible with infectious or inflammatory colitis.  CT head revealed a stable right sided SDH.  Goals of Care:   Code Status: Full Code   DVT prophylaxis: enoxaparin  (LOVENOX ) injection 40 mg Start: 03/02/24 1200 SCDs Start: 02/28/24 2226   Interim Hx: No acute events recorded overnight.  Afebrile.  Vital signs stable.  Does report that he had a self-limited episode of loose stool last night, but reports no ongoing diarrhea.  Is experiencing continued abdominal pain with poor intake.  Reports ongoing low-grade headache which she has had since the time of the surgery.  No chest pain or shortness of breath.  Assessment & Plan:  BRBPR Likely due to combination of Lovenox  and acute ischemic colitis - hemoglobin stable at this time - resumed prophylactic dose Lovenox  6/8 without incident thus far  Acute colitis of descending and sigmoid colon - likely ischemic in nature noted on CT abdomen/pelvis -treated with empiric antibiotic coverage initially - GI evaluated and felt CT findings most consistent with  an ischemic etiology - discontinued antibiotics with resolution of diarrhea - GI has recommended an eventual colonoscopy in the outpatient setting once patient has had enough time to fully recover from his acute illnesses  Traumatic subdural hematoma requiring right sided bur hole for evacuation 02/18/2024 Follow-up CT head notes stable 8 mm low-density right cerebral SDH - operative drainage performed by Dr. [name] 02/18/2024 - asked Neurosurgery to revisit to attend to his surgical wound today  Right upper extremity DVT related to PICC line PICC line was removed when the DVT was noted during recent hospital stay - prophylactic dose Lovenox  was being used for anticoagulation but was held at time of admission due to above - resumed Lovenox  6/8 at DVT prophy dose as previously utilized - f/u venous duplex 6/9 confirms resolution of right upper extremity DVT that had been noted on Doppler 02/14/2024 -given propensity for bleeding and resolution of DVT on follow-up I will not advance Lovenox  beyond prophylactic dose -would continue prophylactic dose for 3 months if tolerated then discontinue entirely  Seizure disorder Due to above - continue Vimpat  and phenobarbital  -no clinical evidence of seizure activity during this admission  COPD with chronic hypoxic respiratory failure Requires 3 L O2 with activity at baseline - stable at present - continue usual meds  HTN Blood pressure modestly elevated with systolics 150-160 -gently adjust medical therapy and follow  Severe hypokalemia Continue to supplement - recheck in a.m.  Hypomagnesemia Continue to supplement to goal of 2.0  Hypothyroidism Continue usual Synthroid  dose  Depression/anxiety Continue usual Celexa  and Xanax  doses   Family  Communication: Spoke with wife at bedside Disposition: Return to SNF, hopefully as soon as 6/11 or 6/12   Objective: Blood pressure (!) 160/64, pulse 72, temperature 97.8 F (36.6 C), resp. rate 18, height 5'  6" (1.676 m), weight 83 kg, SpO2 94%.  Intake/Output Summary (Last 24 hours) at 03/03/2024 0812 Last data filed at 03/03/2024 0451 Gross per 24 hour  Intake 991.02 ml  Output 900 ml  Net 91.02 ml   Filed Weights   02/28/24 1445  Weight: 83 kg    Examination: General: No acute respiratory distress -alert and conversant Lungs: Clear to auscultation bilaterally Cardiovascular: Regular rate and rhythm without murmur  Abdomen: NT/ND, soft, BS positive, no rebound Extremities: No significant cyanosis, clubbing, or edema bilateral lower extremities  CBC: Recent Labs  Lab 02/28/24 1531 02/29/24 0510 03/01/24 0701 03/02/24 0427 03/03/24 0349  WBC 13.7*   < > 11.0* 10.9* 9.0  NEUTROABS 11.2*  --   --   --   --   HGB 12.2*   < > 10.6* 9.2* 9.2*  HCT 36.3*   < > 32.8* 28.7* 28.3*  MCV 101.4*   < > 101.5* 103.2* 102.9*  PLT 286   < > 262 292 306   < > = values in this interval not displayed.   Basic Metabolic Panel: Recent Labs  Lab 03/01/24 0701 03/02/24 0427 03/03/24 0349  NA 131* 136 136  K 2.9* 3.1* 3.3*  CL 102 106 106  CO2 19* 22 24  GLUCOSE 141* 122* 114*  BUN 11 6* 5*  CREATININE 0.48* 0.62 0.53*  CALCIUM  7.8* 8.2* 7.9*  MG 1.7 1.7 1.6*   GFR: Estimated Creatinine Clearance: 93.1 mL/min (A) (by C-G formula based on SCr of 0.53 mg/dL (L)).   Scheduled Meds:  ALPRAZolam   0.5 mg Oral BID   Chlorhexidine  Gluconate Cloth  6 each Topical Daily   citalopram   20 mg Oral Daily   enoxaparin  (LOVENOX ) injection  40 mg Subcutaneous Q24H   lacosamide   200 mg Oral BID   levothyroxine   150 mcg Oral QAC breakfast   metoprolol  tartrate  25 mg Oral BID   PHENobarbital   64.8 mg Oral BID   potassium chloride   40 mEq Oral BID   sodium chloride  flush  10-40 mL Intracatheter Q12H   sodium chloride  flush  3 mL Intravenous Q12H   umeclidinium bromide   1 puff Inhalation Daily     LOS: 4 days   Abbe Abate, MD [facility] Office  [phone] Pager - Text Page  per Tilford Foley  If 7PM-7AM, please contact night-coverage per Amion 03/03/2024, 8:12 AM

## 2024-03-03 NOTE — Care Management Important Message (Signed)
 Important Message  Patient Details  Name: [name] MRN: [id number] Date of Birth: [birth year]   Important Message Given:  Yes - Medicare IM  Trtried to call the patient  in room and on his mobile phone no answer tried to call patient wife no answer will mail a copy to the patient home address.    Leonard Hendler 03/03/2024, 3:46 PM

## 2024-03-03 NOTE — Plan of Care (Signed)

## 2024-03-03 NOTE — NC FL2 (Signed)
 [place]  MEDICAID FL2 LEVEL OF CARE FORM     IDENTIFICATION  Patient Name: [name] Birthdate: Jun 24, 1961 Sex: male Admission Date (Current Location): 02/28/2024  [facility] and [place] Number:  Producer, television/film/video and Address:  [facility], [address] [place], [place] [address]      Provider Number: [id number]  Attending Physician Name and Address:  Abbe Abate, MD  Relative Name and Phone Number:  Daylan, Juhnke (Spouse)  [phone]    Current Level of Care: Hospital Recommended Level of Care: Skilled Nursing Facility Prior Approval Number:    Date Approved/Denied:   PASRR Number: [id number] F expires 05/12/2024  Discharge Plan: SNF    Current Diagnoses: Patient Active Problem List   Diagnosis Date Noted   GIB (gastrointestinal bleeding) 02/29/2024   Ischemic colitis (HCC) 02/29/2024   Rectal bleeding 02/29/2024   Abdominal pain 02/29/2024   Acute lower GI bleeding 02/28/2024   Colitis 02/28/2024   Deep vein thrombosis (DVT) of right upper extremity (HCC) 02/28/2024   Subdural hematoma (HCC) 01/30/2024   Toxic metabolic encephalopathy 01/24/2016   Pressure ulcer 01/23/2016   Non-insulin  dependent type 2 diabetes mellitus (HCC)    Altered mental status 01/22/2016   COPD (chronic obstructive pulmonary disease) (HCC) 01/22/2016   Thrombocytopenia (HCC) 01/22/2016   Acute encephalopathy 11/01/2015   Chronic daily headache 03/08/2015   Localization-related symptomatic 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, intractable, without status epilepticus (HCC) 03/07/2015   Memory loss 03/07/2015   Seizure disorder (HCC) 03/05/2014   Other and unspecified hyperlipidemia 03/05/2014   Essential hypertension, benign 03/05/2014   Diabetes (HCC) 03/05/2014   Depression 03/05/2014   Generalized anxiety disorder 03/05/2014   Hypothyroidism 03/05/2014   Poor dentition 03/05/2014    Orientation RESPIRATION BLADDER  Height & Weight     Self, Place  Normal Continent Weight: 182 lb 15.7 oz (83 kg) Height:  5\' 6"  (167.6 cm)  BEHAVIORAL SYMPTOMS/MOOD NEUROLOGICAL BOWEL NUTRITION STATUS    Convulsions/Seizures (Seizure disorder) Continent Diet (see DC summary)  AMBULATORY STATUS COMMUNICATION OF NEEDS Skin   Extensive Assist Verbally Other (Comment) (PU Stage and Appropriate Care (Pressure Injury 01/31/24 Coccyx Mid Deep Tissue Pressure Injury - Purple or maroon localized area of discolored intact skin or blood-filled blister due to damage of underlying soft tissue from pressure and/or shear.))                       Personal Care Assistance Level of Assistance  Bathing, Feeding, Dressing Bathing Assistance: Maximum assistance Feeding assistance: Maximum assistance Dressing Assistance: Maximum assistance     Functional Limitations Info  Hearing, Sight, Speech Sight Info: Impaired (Impaired (L ([place]))) Hearing Info: Adequate Speech Info: Adequate    SPECIAL CARE FACTORS FREQUENCY  PT (By licensed PT), OT (By licensed OT)     PT Frequency: 5x/week OT Frequency: 5x/week            Contractures Contractures Info: Not present    Additional Factors Info  Code Status, Allergies, Insulin  Sliding Scale Code Status Info: FULL Allergies Info: Levetiracetam   Tegretol (Carbamazepine)  Penicillins  Topiramate   Insulin  Sliding Scale Info: Please see DC summary       Current Medications (03/03/2024):  This is the current hospital active medication list Current Facility-Administered Medications  Medication Dose Route Frequency Provider Last Rate Last Admin   acetaminophen  (TYLENOL ) tablet 650 mg  650 mg Oral Q6H PRN Patel, Vishal R, MD   650 mg  at 03/03/24 0924   albuterol  (PROVENTIL ) (2.5 MG/3ML) 0.083% nebulizer solution 2.5 mg  2.5 mg Inhalation Q4H PRN Patel, Vishal R, MD       ALPRAZolam  (XANAX ) tablet 0.5 mg  0.5 mg Oral BID Patel, Vishal R, MD   0.5 mg at 03/03/24 0847   Chlorhexidine   Gluconate Cloth 2 % PADS 6 each  6 each Topical Daily Abbe Abate, MD   6 each at 03/03/24 0849   citalopram  (CELEXA ) tablet 20 mg  20 mg Oral Daily Patel, Vishal R, MD   20 mg at 03/03/24 0849   enoxaparin  (LOVENOX ) injection 40 mg  40 mg Subcutaneous Q24H Elvera Hamilton T, MD   40 mg at 03/03/24 1246   feeding supplement (ENSURE PLUS HIGH PROTEIN) liquid 237 mL  237 mL Oral BID BM Abbe Abate, MD       lacosamide  (VIMPAT ) tablet 200 mg  200 mg Oral BID Patel, Vishal R, MD   200 mg at 03/03/24 2956   levothyroxine  (SYNTHROID ) tablet 150 mcg  150 mcg Oral QAC breakfast Patel, Vishal R, MD   150 mcg at 03/03/24 0849   loperamide (IMODIUM) capsule 2 mg  2 mg Oral PRN Abbe Abate, MD       metoprolol  tartrate (LOPRESSOR ) tablet 25 mg  25 mg Oral BID Patel, Vishal R, MD   25 mg at 03/03/24 0847   ondansetron  (ZOFRAN ) tablet 4 mg  4 mg Oral Q6H PRN Patel, Vishal R, MD   4 mg at 03/01/24 2130   Or   ondansetron  (ZOFRAN ) injection 4 mg  4 mg Intravenous Q6H PRN Patel, Vishal R, MD       oxyCODONE  (Oxy IR/ROXICODONE ) immediate release tablet 5-10 mg  5-10 mg Oral Q4H PRN Abbe Abate, MD   10 mg at 03/03/24 1244   PHENobarbital  (LUMINAL) tablet 64.8 mg  64.8 mg Oral BID Patel, Vishal R, MD   64.8 mg at 03/03/24 0847   potassium chloride  (KLOR-CON ) packet 40 mEq  40 mEq Oral BID Abbe Abate, MD   40 mEq at 03/03/24 0848   sodium chloride  flush (NS) 0.9 % injection 10-40 mL  10-40 mL Intracatheter Q12H Elvera Hamilton T, MD   10 mL at 03/03/24 0848   sodium chloride  flush (NS) 0.9 % injection 3 mL  3 mL Intravenous Q12H Patel, Vishal R, MD   3 mL at 03/03/24 0849   umeclidinium bromide  (INCRUSE ELLIPTA ) 62.5 MCG/ACT 1 puff  1 puff Inhalation Daily Reome, Earle J, RPH   1 puff at 03/03/24 8657     Discharge Medications: Please see discharge summary for a list of discharge medications.  Relevant Imaging Results:  Relevant Lab Results:   Additional Information SS#  [id number]  Edla Para A [place], LCSW

## 2024-03-03 NOTE — Progress Notes (Signed)
 Removed 20 staples from patients head. Tolerated well. Will continue to monitor.

## 2024-03-03 NOTE — TOC Initial Note (Signed)
 Transition of Care [facility]) - Initial/Assessment Note    Patient Details  Name: [name] MRN: [id number] Date of Birth: 12-07-1960  Transition of Care [facility]) CM/SW Contact:    Rheta Hemmelgarn A [place], LCSW Phone Number: 03/03/2024, 4:03 PM  Clinical Narrative:                  CSW met with pt at bedside. He confirmed that he was from [facility] for SNF before hospital admission. States that he would prefer to go back home if possible. If unable, he says that he wants to be faxed out to other placement besides [facility] for rehab. PT recommends SNF at discharge, pt faxed out, bed offers pending.   CSW reached out to pt's wife [name] to provide update, CSW left vm with contact information to reach out to CSW.   TOC will continue to follow.   Expected Discharge Plan: Skilled Nursing Facility Barriers to Discharge: Continued Medical Work up, English as a second language teacher, SNF Pending bed offer   Patient Goals and CMS Choice            Expected Discharge Plan and Services                                              Prior Living Arrangements/Services              Need for Family Participation in Patient Care: Yes (Comment) Care giver support system in place?: Yes (comment) (pt's wife,suzanne)      Activities of Daily Living   ADL Screening (condition at time of admission) Independently performs ADLs?: No Does the patient have a NEW difficulty with bathing/dressing/toileting/self-feeding that is expected to last >3 days?: No Does the patient have a NEW difficulty with getting in/out of bed, walking, or climbing stairs that is expected to last >3 days?: No Does the patient have a NEW difficulty with communication that is expected to last >3 days?: No Is the patient deaf or have difficulty hearing?: No Does the patient have difficulty seeing, even when wearing glasses/contacts?: Yes Does the patient have difficulty concentrating, remembering, or making decisions?:  Yes  Permission Sought/Granted                  Emotional Assessment Appearance:: Appears stated age Attitude/Demeanor/Rapport: Lethargic Affect (typically observed): Calm Orientation: : Oriented to Self, Oriented to Place, Oriented to  Time, Oriented to Situation Alcohol / Substance Use: Not Applicable Psych Involvement: No (comment)  Admission diagnosis:  Colitis [K52.9] Rectal bleeding [K62.5] Acute lower GI bleeding [K92.2] GIB (gastrointestinal bleeding) [K92.2] Patient Active Problem List   Diagnosis Date Noted   GIB (gastrointestinal bleeding) 02/29/2024   Ischemic colitis (HCC) 02/29/2024   Rectal bleeding 02/29/2024   Abdominal pain 02/29/2024   Acute lower GI bleeding 02/28/2024   Colitis 02/28/2024   Deep vein thrombosis (DVT) of right upper extremity (HCC) 02/28/2024   Subdural hematoma (HCC) 01/30/2024   Toxic metabolic encephalopathy 01/24/2016   Pressure ulcer 01/23/2016   Non-insulin  dependent type 2 diabetes mellitus (HCC)    Altered mental status 01/22/2016   COPD (chronic obstructive pulmonary disease) (HCC) 01/22/2016   Thrombocytopenia (HCC) 01/22/2016   Acute encephalopathy 11/01/2015   Chronic daily headache 03/08/2015   Localization-related symptomatic 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, intractable, without status epilepticus (HCC) 03/07/2015   Memory loss 03/07/2015   Seizure  disorder (HCC) 03/05/2014   Other and unspecified hyperlipidemia 03/05/2014   Essential hypertension, benign 03/05/2014   Diabetes (HCC) 03/05/2014   Depression 03/05/2014   Generalized anxiety disorder 03/05/2014   Hypothyroidism 03/05/2014   Poor dentition 03/05/2014   PCP:  Claudell Cruz, MD Pharmacy:   [facility] [place], [place] - [address] [facility] [address] [place] [address] Phone: [phone] Fax: [phone]     Social Drivers of Health (SDOH) Social History: SDOH Screenings   Food  Insecurity: Patient Unable To Answer (02/29/2024)  Housing: Patient Unable To Answer (02/29/2024)  Transportation Needs: Patient Unable To Answer (02/29/2024)  Utilities: Patient Unable To Answer (02/29/2024)  Financial Resource Strain: Low Risk  (12/31/2022)   Received from [facility]  Physical Activity: Unknown (03/27/2023)   Received from [facility]  Social Connections: Patient Unable To Answer (02/29/2024)  Stress: Stress Concern Present (03/27/2023)   Received from [facility]  Tobacco Use: High Risk (02/29/2024)   SDOH Interventions:     Readmission Risk Interventions     No data to display

## 2024-03-04 ENCOUNTER — Inpatient Hospital Stay (HOSPITAL_COMMUNITY): Payer: Medicare (Managed Care)

## 2024-03-04 DIAGNOSIS — K922 Gastrointestinal hemorrhage, unspecified: Secondary | ICD-10-CM | POA: Diagnosis not present

## 2024-03-04 LAB — CBC
HCT: 28.9 % — ABNORMAL LOW (ref 39.0–52.0)
Hemoglobin: 9.2 g/dL — ABNORMAL LOW (ref 13.0–17.0)
MCH: 32.9 pg (ref 26.0–34.0)
MCHC: 31.8 g/dL (ref 30.0–36.0)
MCV: 103.2 fL — ABNORMAL HIGH (ref 80.0–100.0)
Platelets: 324 10*3/uL (ref 150–400)
RBC: 2.8 MIL/uL — ABNORMAL LOW (ref 4.22–5.81)
RDW: 17.2 % — ABNORMAL HIGH (ref 11.5–15.5)
WBC: 10 10*3/uL (ref 4.0–10.5)
nRBC: 0 % (ref 0.0–0.2)

## 2024-03-04 LAB — BASIC METABOLIC PANEL WITH GFR
Anion gap: 5 (ref 5–15)
BUN: 10 mg/dL (ref 8–23)
CO2: 27 mmol/L (ref 22–32)
Calcium: 8.4 mg/dL — ABNORMAL LOW (ref 8.9–10.3)
Chloride: 106 mmol/L (ref 98–111)
Creatinine, Ser: 0.63 mg/dL (ref 0.61–1.24)
GFR, Estimated: >60 mL/min (ref >60)
Glucose, Bld: 119 mg/dL — ABNORMAL HIGH (ref 70–99)
Potassium: 3.8 mmol/L (ref 3.5–5.1)
Sodium: 138 mmol/L (ref 135–145)

## 2024-03-04 LAB — PHENOBARBITAL LEVEL: Phenobarbital: 14.6 ug/mL — ABNORMAL LOW (ref 15.0–40.0)

## 2024-03-04 LAB — MAGNESIUM: Magnesium: 1.6 mg/dL — ABNORMAL LOW (ref 1.7–2.4)

## 2024-03-04 MED ORDER — MAGNESIUM SULFATE 2 GM/50ML IV SOLN
2.0000 g | Freq: Once | INTRAVENOUS | Status: AC
  Administered 2024-03-04: 2 g via INTRAVENOUS
  Filled 2024-03-04: qty 50

## 2024-03-04 MED ORDER — IOHEXOL 9 MG/ML PO SOLN: 500.0000 mL | ORAL | Status: DC

## 2024-03-04 MED ORDER — ACETAMINOPHEN 500 MG PO TABS
1000.0000 mg | ORAL_TABLET | Freq: Three times a day (TID) | ORAL | Status: AC
  Administered 2024-03-04 – 2024-03-06 (×8): 1000 mg via ORAL
  Filled 2024-03-04 (×9): qty 2

## 2024-03-04 MED ORDER — IOHEXOL 350 MG/ML SOLN
75.0000 mL | Freq: Once | INTRAVENOUS | Status: AC | PRN
  Administered 2024-03-04: 75 mL via INTRAVENOUS

## 2024-03-04 MED ORDER — ACETAMINOPHEN 325 MG PO TABS
650.0000 mg | ORAL_TABLET | Freq: Four times a day (QID) | ORAL | Status: DC | PRN
  Administered 2024-03-07: 650 mg via ORAL
  Filled 2024-03-04: qty 2

## 2024-03-04 NOTE — TOC Progression Note (Signed)
 Transition of Care [facility]) - Progression Note    Patient Details  Name: [name] MRN: [id number] Date of Birth: April 24, 1961  Transition of Care [facility]) CM/SW Contact  Mckell Riecke A [place], LCSW Phone Number: 03/04/2024, 3:08 PM  Clinical Narrative:     CSW contacted pt's spouse, [name]. She said that she wanted [name] for rehab placement.   CSW reached out to [name], stated not sure when bed would be available but would let CSW know once one opens up.   CSW to reach out to pt's wife regarding other option of [name] as she does not want to return to [name] if possible.   TOC will continue to follow.   Expected Discharge Plan: Skilled Nursing Facility Barriers to Discharge: Continued Medical Work up, English as a second language teacher, SNF Pending bed offer  Expected Discharge Plan and Services                                               Social Determinants of Health (SDOH) Interventions SDOH Screenings   Food Insecurity: Patient Unable To Answer (02/29/2024)  Housing: Patient Unable To Answer (02/29/2024)  Transportation Needs: Patient Unable To Answer (02/29/2024)  Utilities: Patient Unable To Answer (02/29/2024)  Financial Resource Strain: Low Risk  (12/31/2022)   Received from [facility]  Physical Activity: Unknown (03/27/2023)   Received from [facility]  Social Connections: Patient Unable To Answer (02/29/2024)  Stress: Stress Concern Present (03/27/2023)   Received from [facility]  Tobacco Use: High Risk (02/29/2024)    Readmission Risk Interventions     No data to display

## 2024-03-04 NOTE — Evaluation (Signed)
 Occupational Therapy Evaluation Patient Details Name: [name] MRN: [id number] DOB: 09-22-1961 Today's Date: 03/04/2024   History of Present Illness   Pt is a 63 y.o. M presenting to [facility] ED from SNF with rectal bleeding on 02/29/24. Pt found to have acute colitis of descending and sigmoid colon. Pt found also to have a RUE DVT related to PICC. Pt had a recent admission due to traumatic subdural hematoma. PMH is significant for COPD, chronic hypoxia requiring 3L via [place] w/ activity, HTN, hypothyroidism, depression/axiety, right UE DVT on Lovenox , and recent traumatic right subdural hematoma complicated by seizure activity requiring right sided bur hole for evacuation 02/18/2024     Clinical Impressions Pt presented in bed with wife present and she reported they were just getting started with rehab at the SNF. At this time pt attempted to complete bed mobility with CGA to min assist but while sitting at EOB reporting feeling very dizzy requesting to go back into bed. He was unaware about having a BM and needed max -total for peri care. At this time Patient will benefit from continued inpatient follow up therapy, <3 hours/day but would like to go to anther location then what they were at prior.      If plan is discharge home, recommend the following:   Two people to help with walking and/or transfers;Two people to help with bathing/dressing/bathroom;Assist for transportation;Help with stairs or ramp for entrance;Direct supervision/assist for medications management;Direct supervision/assist for financial management;Assistance with cooking/housework;Assistance with feeding     Functional Status Assessment   Patient has had a recent decline in their functional status and demonstrates the ability to make significant improvements in function in a reasonable and predictable amount of time.     Equipment Recommendations   Other (comment) (TBD)     Recommendations for Other Services          Precautions/Restrictions   Precautions Precautions: Fall Recall of Precautions/Restrictions: Intact Restrictions Weight Bearing Restrictions Per Provider Order: No     Mobility Bed Mobility Overal bed mobility: Needs Assistance Bed Mobility: Supine to Sit, Sit to Supine Rolling: Min assist, Used rails   Supine to sit: Contact guard, Min assist Sit to supine: Contact guard assist, Min assist        Transfers                   General transfer comment: deffered as while sitting at EOB starting to feel dizzy      Balance Overall balance assessment: Needs assistance Sitting-balance support: Feet supported, Bilateral upper extremity supported Sitting balance-Leahy Scale: Fair                                     ADL either performed or assessed with clinical judgement   ADL Overall ADL's : Needs assistance/impaired Eating/Feeding: Set up Eating/Feeding Details (indicate cue type and reason): simulated Grooming: Wash/dry face;Set up;Bed level   Upper Body Bathing: Contact guard assist;Bed level   Lower Body Bathing: Maximal assistance;Bed level   Upper Body Dressing : Contact guard assist;Bed level   Lower Body Dressing: Maximal assistance;Bed level                       Vision Baseline Vision/History: 4 Cataracts Ability to See in Adequate Light: 3 Highly impaired Patient Visual Report: No change from baseline (Pt reporting they do not like the lights on) Vision Assessment?:  Vision impaired- to be further tested in functional context Additional Comments: poor vision at baseline     Perception Perception: Impaired       Praxis         Pertinent Vitals/Pain Pain Assessment Pain Assessment: Faces Faces Pain Scale: Hurts little more Breathing: normal Negative Vocalization: none Facial Expression: smiling or inexpressive Body Language: relaxed Consolability: no need to console PAINAD Score: 0 Facial Expression:  Tense Body Movements: Absence of movements Muscle Tension: Relaxed Compliance with ventilator (intubated pts.): N/A Vocalization (extubated pts.): N/A CPOT Total: 1 Pain Location: head Pain Descriptors / Indicators: Constant, Discomfort, Guarding, Moaning Pain Intervention(s): Limited activity within patient's tolerance     Extremity/Trunk Assessment Upper Extremity Assessment Upper Extremity Assessment: Generalized weakness   Lower Extremity Assessment Lower Extremity Assessment: Defer to PT evaluation   Cervical / Trunk Assessment Cervical / Trunk Assessment: Kyphotic   Communication     Cognition Arousal: Alert Behavior During Therapy: Flat affect Cognition: Difficult to assess         Attention impairment (select first level of impairment): Divided attention Executive functioning impairment (select all impairments): Problem solving, Sequencing, Reasoning, Organization OT - Cognition Comments: pt did not like to talk much in session and wife spoke mostly                 Following commands: Intact Following commands impaired: Only follows one step commands consistently     Cueing  General Comments   Cueing Techniques: Verbal cues  pt when moving in bed to sitting reported starting to feel dizzy. Pt BP supine 153/61 sitting at EOB 198/115 supine 165/77 but all taken on RLE with HR in 80s   Exercises     Shoulder Instructions      Home Living Family/patient expects to be discharged to:: Private residence Living Arrangements: Spouse/significant other Available Help at Discharge: Family;Available 24 hours/day Type of Home: House Home Access: Stairs to enter [employer] of Steps: 4 Entrance Stairs-Rails: Right;Left Home Layout: One level     Bathroom Shower/Tub: Chief Strategy Officer: Standard     Home Equipment: Rollator (4 wheels);Cane - single point;BSC/3in1;Standard Environmental consultant;Shower seat   Additional Comments: reported via  wife  Lives With: Spouse    Prior Functioning/Environment Prior Level of Function : Needs assist             Mobility Comments: Per patient's wife, prior to SDH pt ambulating without DME. ADLs Comments: wife states sponge bathing as showering had become too difficiult    OT Problem List: Decreased strength;Decreased activity tolerance;Impaired balance (sitting and/or standing);Impaired vision/perception;Decreased cognition;Decreased safety awareness;Decreased knowledge of use of DME or AE   OT Treatment/Interventions: Self-care/ADL training;Therapeutic exercise;DME and/or AE instruction;Balance training;Patient/family education;Therapeutic activities;Cognitive remediation/compensation      OT Goals(Current goals can be found in the care plan section)   Acute Rehab OT Goals Patient Stated Goal: to get tout of the hospital OT Goal Formulation: With patient Time For Goal Achievement: 03/18/24 Potential to Achieve Goals: Good   OT Frequency:  Min 2X/week    Co-evaluation              AM-PAC OT 6 Clicks Daily Activity     Outcome Measure Help from another person eating meals?: A Little Help from another person taking care of personal grooming?: A Little Help from another person toileting, which includes using toliet, bedpan, or urinal?: A Lot Help from another person bathing (including washing, rinsing, drying)?: A Lot Help from another  person to put on and taking off regular upper body clothing?: A Little Help from another person to put on and taking off regular lower body clothing?: A Lot 6 Click Score: 15   End of Session Equipment Utilized During Treatment: Gait belt;Rolling walker (2 wheels) Nurse Communication: Mobility status  Activity Tolerance: Other (comment) (dizziness) Patient left: in bed;with call bell/phone within reach;with bed alarm set;with family/visitor present  OT Visit Diagnosis: Unsteadiness on feet (R26.81);Muscle weakness (generalized)  (M62.81);Other symptoms and signs involving cognitive function;Low vision, both eyes (H54.2)                Time: 1610-9604 OT Time Calculation (min): 36 min Charges:  OT General Charges $OT Visit: 1 Visit OT Evaluation $OT Eval Moderate Complexity: 1 Mod OT Treatments $Self Care/Home Management : 8-22 mins  Erving Heather OTR/L  Acute Rehab Services  [phone] office number   Stevphen Elders 03/04/2024, 10:08 AM

## 2024-03-04 NOTE — Progress Notes (Signed)
 PROGRESS NOTE    [name]  MWU:[id number] DOB: 1961/07/23 DOA: 02/28/2024 PCP: Danny Cruz, MD  Chief Complaint  Patient presents with   Rectal Bleeding   Rectal Pain    Brief Narrative:   63 year old with [name] history of COPD with tobacco abuse, chronic hypoxia requiring 3 L nasal cannula with activity, HTN, hypothyroidism, depression/anxiety, right upper extremity DVT on low-dose Lovenox , and [name] recent traumatic right subdural hematoma complicated by seizure activity requiring right sided bur holes for evacuation 02/18/2024 (D/C to SNF 02/24/24) who was transported to the ER from his SNF with rectal bleeding.  He was enjoying [name] meal at the SNF when he developed the acute onset of left lower quadrant abdominal pain followed by an episode of bright red blood per rectum.  In the ER his blood pressure was stable.  Hemoglobin was found to be 12.2.  He was guaiac positive.  INR was 1.1.  CT of the abdomen/pelvis noted circumferential wall thickening of the descending colon and sigmoid colon compatible with infectious or inflammatory colitis.  CT head revealed [name] stable right sided SDH.   Assessment & Plan:   Principal Problem:   Acute lower GI bleeding Active Problems:   Colitis   Subdural hematoma (HCC)   Deep vein thrombosis (DVT) of right upper extremity (HCC)   Seizure disorder (HCC)   Essential hypertension, benign   Hypothyroidism   COPD (chronic obstructive pulmonary disease) (HCC)   GIB (gastrointestinal bleeding)   Ischemic colitis (HCC)   Rectal bleeding   Abdominal pain  BRBPR Likely due to combination of Lovenox  and acute ischemic colitis  hemoglobin stable at this time, resolved resumed prophylactic dose Lovenox  6/8   Acute colitis of descending and sigmoid colon - likely ischemic in nature 6/6 CT angio abd/pelvis with circumferential wall thickening involving the descending colon and sigmoid colon  Seen by GI, suspected ischemic colitis - recommending colonoscopy  outpatient with primary GI MD Stool studies ordered, but appear to have been canceled, likely due to lack of diarrhea?  Abx were discontinued with resolution of diarrhea Has pain today, more impressive than I'd expect at this point in his illness (exam yesterday without notable tenderness), will get repeat CT abd/pelvis to eval for complication   Traumatic subdural hematoma requiring right sided bur hole for evacuation 02/18/2024 Intractable Headache  Follow-up CT head notes stable 8 mm low-density right cerebral SDH - operative drainage performed by Dr. Andy Matthews 02/18/2024  ? If nsgy saw him yesterday (sounds like they did, staples removed - discussed with Dr. Andy Matthews) - will reach out to discuss his chronic headaches (may have headaches up to 6 weeks or so), opiates not great option long term with risk of rebound   Right upper extremity DVT related to PICC line PICC line was removed when the DVT was noted during recent hospital stay  He'd been on prophylactic dose anticoagulation after discussion with NSGY US  without DVT in upper extremity 6/9 (5/23 had DVT in R axillary and brachial veins) Will discuss anticoagulation options, but based on my conversation with patient's wife, they don't seem particularly interested in resuming anticoagulation.  Discussed that would technically recommend 3 months anticoagulation given his catheter related DVT (even with US  showing resolution).  Suggestion to use ppx dosing for 3 months noted, will discuss further with wife who has some hesitation.   Seizure disorder Due to above - continue Vimpat  and phenobarbital  -no clinical evidence of seizure activity during this admission   COPD with chronic  hypoxic respiratory failure Requires 3 L O2 with activity at baseline (currently on room air) - stable at present - continue usual meds   HTN Blood pressure modestly elevated with systolics 150-160 -gently adjust medical therapy and follow   Severe  hypokalemia Improved, follow    Hypomagnesemia Replace and follow    Hypothyroidism Continue usual Synthroid  dose   Depression/anxiety Continue usual Celexa  and Xanax  doses    DVT prophylaxis: lovenox  Code Status: full Family Communication: wife at bedside Disposition:   Status is: Inpatient Remains inpatient appropriate because: need for continued inpatient care   Consultants:  NSGY  Procedures:  gastroenterology  Antimicrobials:  Anti-infectives (From admission, onward)    Start     Dose/Rate Route Frequency Ordered Stop   02/28/24 2100  cefTRIAXone  (ROCEPHIN ) 2 g in sodium chloride  0.9 % 100 mL IVPB  Status:  Discontinued        2 g 200 mL/hr over 30 Minutes Intravenous Every 24 hours 02/28/24 2053 03/02/24 1529   02/28/24 2100  metroNIDAZOLE  (FLAGYL ) IVPB 500 mg  Status:  Discontinued        500 mg 100 mL/hr over 60 Minutes Intravenous Every 12 hours 02/28/24 2053 03/02/24 1529       Subjective: C/o headache (since surgery) and abdominal pain   Objective: Vitals:   03/03/24 2016 03/03/24 2016 03/04/24 0359 03/04/24 0748  BP: (!) 149/76 (!) 149/76 (!) 147/71 (!) 153/80  Pulse: 93 97 70 83  Resp: 18 18 18 19   Temp: 98.5 F (36.9 C) 98.5 F (36.9 C) 97.9 F (36.6 C) 98.5 F (36.9 C)  TempSrc:      SpO2:  97% 94% 94%  Weight:      Height:        Intake/Output Summary (Last 24 hours) at 03/04/2024 0849 Last data filed at 03/04/2024 0300 Gross per 24 hour  Intake 200 ml  Output 1550 ml  Net -1350 ml   Filed Weights   02/28/24 1445  Weight: 83 kg    Examination:  General exam: appears uncomfortable Respiratory system: unlabored Cardiovascular system: RRR Gastrointestinal system: TTP in lower quadrants/midline - soft, nondistended  Central nervous system: Alert and oriented. No focal neurological deficits. Extremities: no LEE   Data Reviewed: I have personally reviewed following labs and imaging studies  CBC: Recent Labs  Lab  02/28/24 1531 02/29/24 0510 02/29/24 1243 03/01/24 0701 03/02/24 0427 03/03/24 0349 03/04/24 0344  WBC 13.7*   < > 14.2* 11.0* 10.9* 9.0 10.0  NEUTROABS 11.2*  --   --   --   --   --   --   HGB 12.2*   < > 10.3* 10.6* 9.2* 9.2* 9.2*  HCT 36.3*   < > 30.6* 32.8* 28.7* 28.3* 28.9*  MCV 101.4*   < > 101.0* 101.5* 103.2* 102.9* 103.2*  PLT 286   < > 251 262 292 306 324   < > = values in this interval not displayed.    Basic Metabolic Panel: Recent Labs  Lab 02/29/24 0510 03/01/24 0701 03/02/24 0427 03/03/24 0349 03/04/24 0344  NA 136 131* 136 136 138  K 2.9* 2.9* 3.1* 3.3* 3.8  CL 105 102 106 106 106  CO2 21* 19* 22 24 27   GLUCOSE 151* 141* 122* 114* 119*  BUN 18 11 6* 5* 10  CREATININE 0.46* 0.48* 0.62 0.53* 0.63  CALCIUM  8.7* 7.8* 8.2* 7.9* 8.4*  MG 1.4* 1.7 1.7 1.6* 1.6*    GFR: Estimated Creatinine Clearance: 93.1 mL/min (  by C-G formula based on SCr of 0.63 mg/dL).  Liver Function Tests: Recent Labs  Lab 02/28/24 1531  AST 37  ALT 55*  ALKPHOS 97  BILITOT 0.9  PROT 7.4  ALBUMIN 3.6    CBG: No results for input(s): GLUCAP in the last 168 hours.   No results found for this or any previous visit (from the past 240 hours).       Radiology Studies: VAS US  UPPER EXTREMITY VENOUS DUPLEX Result Date: 03/02/2024 UPPER VENOUS STUDY  Patient Name:  SOMNANG MAHAN  Date of Exam:   03/02/2024 Medical Rec #: [id number]      Accession #:    [id number] Date of Birth: [birth year]      Patient Gender: M Patient Age:   66 years Exam Location:  [facility] Procedure:      VAS US  UPPER EXTREMITY VENOUS DUPLEX Referring Phys: JEFFREY MCCLUNG --------------------------------------------------------------------------------  Indications: DVT Risk Factors: DVT. Limitations: Poor ultrasound/tissue interface. Comparison Study: 02/14/2024 - Right:                   No evidence of superficial vein thrombosis in the upper                   extremity.                   Findings                    consistent with acute deep vein thrombosis involving the right                   axillary                   vein and                   right brachial veins.                    Left:                   Findings consistent with acute superficial vein thrombosis                   involving the                   left                   cephalic vein. Performing Technologist: Lerry Ransom RVT  Examination Guidelines: Hollis Oh complete evaluation includes B-mode imaging, spectral Doppler, color Doppler, and power Doppler as needed of all accessible portions of each vessel. Bilateral testing is considered an integral part of Edrian Melucci complete examination. Limited examinations for reoccurring indications may be performed as noted.  Right Findings: +----------+------------+---------+-----------+----------+-------+ RIGHT     CompressiblePhasicitySpontaneousPropertiesSummary +----------+------------+---------+-----------+----------+-------+ IJV           Full       Yes       Yes                      +----------+------------+---------+-----------+----------+-------+ Subclavian               Yes       Yes                      +----------+------------+---------+-----------+----------+-------+ Axillary      Full       Yes  Yes                      +----------+------------+---------+-----------+----------+-------+ Brachial      Full                                          +----------+------------+---------+-----------+----------+-------+ Radial        Full                                          +----------+------------+---------+-----------+----------+-------+ Ulnar         Full                                          +----------+------------+---------+-----------+----------+-------+ Cephalic      Full                                          +----------+------------+---------+-----------+----------+-------+ Basilic       Full                                           +----------+------------+---------+-----------+----------+-------+  Left Findings: +----------+------------+---------+-----------+----------+-------+ LEFT      CompressiblePhasicitySpontaneousPropertiesSummary +----------+------------+---------+-----------+----------+-------+ Subclavian               Yes       Yes                      +----------+------------+---------+-----------+----------+-------+  Summary:  Right: No evidence of deep vein thrombosis in the upper extremity. No evidence of superficial vein thrombosis in the upper extremity.  Left: No evidence of thrombosis in the subclavian.  *See table(s) above for measurements and observations.  Diagnosing physician: Irvin Mantel Electronically signed by Irvin Mantel on 03/02/2024 at 4:54:23 PM.    Final         Scheduled Meds:  acetaminophen   1,000 mg Oral Q8H   ALPRAZolam   0.5 mg Oral BID   amLODipine  10 mg Oral Daily   Chlorhexidine  Gluconate Cloth  6 each Topical Daily   citalopram   20 mg Oral Daily   enoxaparin  (LOVENOX ) injection  40 mg Subcutaneous Q24H   feeding supplement  237 mL Oral BID BM   lacosamide   200 mg Oral BID   levothyroxine   150 mcg Oral QAC breakfast   metoprolol  tartrate  50 mg Oral BID   PHENobarbital   64.8 mg Oral BID   potassium chloride   40 mEq Oral BID   sodium chloride  flush  10-40 mL Intracatheter Q12H   sodium chloride  flush  3 mL Intravenous Q12H   umeclidinium bromide   1 puff Inhalation Daily   Continuous Infusions:   LOS: 5 days    Time spent: over 30 min     Donnetta Gains, MD [facility]   To contact the attending provider between 7A-7P or the covering provider during after hours 7P-7A, please log into the web site [email or web address] and access using universal [place] password for that web site. If you do not  have the password, please call the hospital operator.  03/04/2024, 8:49 AM

## 2024-03-04 NOTE — Plan of Care (Signed)

## 2024-03-05 DIAGNOSIS — K922 Gastrointestinal hemorrhage, unspecified: Secondary | ICD-10-CM | POA: Diagnosis not present

## 2024-03-05 LAB — CBC WITH DIFFERENTIAL/PLATELET
Abs Immature Granulocytes: 0.1 10*3/uL — ABNORMAL HIGH (ref 0.00–0.07)
Basophils Absolute: 0 10*3/uL (ref 0.0–0.1)
Basophils Relative: 0 %
Eosinophils Absolute: 0.1 10*3/uL (ref 0.0–0.5)
Eosinophils Relative: 1 %
HCT: 29.2 % — ABNORMAL LOW (ref 39.0–52.0)
Hemoglobin: 9.2 g/dL — ABNORMAL LOW (ref 13.0–17.0)
Immature Granulocytes: 1 %
Lymphocytes Relative: 32 %
Lymphs Abs: 3 10*3/uL (ref 0.7–4.0)
MCH: 32.3 pg (ref 26.0–34.0)
MCHC: 31.5 g/dL (ref 30.0–36.0)
MCV: 102.5 fL — ABNORMAL HIGH (ref 80.0–100.0)
Monocytes Absolute: 1.2 10*3/uL — ABNORMAL HIGH (ref 0.1–1.0)
Monocytes Relative: 13 %
Neutro Abs: 5 10*3/uL (ref 1.7–7.7)
Neutrophils Relative %: 53 %
Platelets: 335 10*3/uL (ref 150–400)
RBC: 2.85 MIL/uL — ABNORMAL LOW (ref 4.22–5.81)
RDW: 17.1 % — ABNORMAL HIGH (ref 11.5–15.5)
WBC: 9.4 10*3/uL (ref 4.0–10.5)
nRBC: 0 % (ref 0.0–0.2)

## 2024-03-05 LAB — COMPREHENSIVE METABOLIC PANEL WITH GFR
ALT: 40 U/L (ref 0–44)
AST: 34 U/L (ref 15–41)
Albumin: 2.6 g/dL — ABNORMAL LOW (ref 3.5–5.0)
Alkaline Phosphatase: 94 U/L (ref 38–126)
Anion gap: 6 (ref 5–15)
BUN: 8 mg/dL (ref 8–23)
CO2: 26 mmol/L (ref 22–32)
Calcium: 8.4 mg/dL — ABNORMAL LOW (ref 8.9–10.3)
Chloride: 104 mmol/L (ref 98–111)
Creatinine, Ser: 0.54 mg/dL — ABNORMAL LOW (ref 0.61–1.24)
GFR, Estimated: >60 mL/min (ref >60)
Glucose, Bld: 115 mg/dL — ABNORMAL HIGH (ref 70–99)
Potassium: 4.4 mmol/L (ref 3.5–5.1)
Sodium: 136 mmol/L (ref 135–145)
Total Bilirubin: 0.5 mg/dL (ref 0.0–1.2)
Total Protein: 5.9 g/dL — ABNORMAL LOW (ref 6.5–8.1)

## 2024-03-05 LAB — PHOSPHORUS: Phosphorus: 3.1 mg/dL (ref 2.5–4.6)

## 2024-03-05 LAB — MAGNESIUM: Magnesium: 1.7 mg/dL (ref 1.7–2.4)

## 2024-03-05 NOTE — Progress Notes (Signed)
 Physical Therapy Treatment Patient Details Name: [name] MRN: [id number] DOB: [birth year] Today's Date: 03/05/2024   History of Present Illness Pt is a 62 y.o. M presenting to [facility] ED from SNF with rectal bleeding on 02/29/24. Pt found to have acute colitis of descending and sigmoid colon. Pt found also to have a RUE DVT related to PICC. Pt had a recent admission due to traumatic subdural hematoma. PMH is significant for tobacco use, COPD, chronic hypoxia requiring 3L via [place] w/ activity, HTN, hypothyroidism, depression/axiety, right UE DVT on Lovenox , and recent traumatic right subdural hematoma complicated by seizure activity requiring right sided burr hole for evacuation 02/18/2024    PT Comments  Pt received in supine, agreeable to therapy session and with improved participation and tolerance for transfer training this date compared with OT session previous date. Pt does c/o severe headache pain but BP/HR stable and per spouse this is a chronic pain for him since recent SDH. Pt needing up to +2 modA for safety for transfers and pre-gait standing tasks in [place] frame. Pt defers gait/RW transfers due to increased c/o headache, RN arriving to room to give pain meds at end of session, pt agreeable to sit up in recliner, spouse present and agreeable to notify staff when he is ready to get back to bed. Patient will benefit from continued inpatient follow up therapy, <3 hours/day     If plan is discharge home, recommend the following: Two people to help with walking and/or transfers;A lot of help with bathing/dressing/bathroom;Assistance with cooking/housework;Assistance with feeding;Direct supervision/assist for medications management;Direct supervision/assist for financial management;Assist for transportation;Help with stairs or ramp for entrance;Supervision due to cognitive status   Can travel by private vehicle     No  Equipment Recommendations  None recommended by PT    Recommendations for Other  Services       Precautions / Restrictions Precautions Precautions: Fall Recall of Precautions/Restrictions: Impaired Precaution/Restrictions Comments: mild impulsivity Restrictions Weight Bearing Restrictions Per Provider Order: No     Mobility  Bed Mobility Overal bed mobility: Needs Assistance Bed Mobility: Rolling, Sidelying to Sit Rolling: Min assist, Used rails Sidelying to sit: Mod assist, HOB elevated, Used rails       General bed mobility comments: Pt is able to initiate bed mobility but requires mod A for obtaining upright from forearm prop position for sup>sit. Heavy encouragement to initiate, increased time to perform; to R EOB    Transfers Overall transfer level: Needs assistance Equipment used: Ambulation equipment used Transfers: Sit to/from Stand, Bed to chair/wheelchair/BSC Sit to Stand: Min assist, Mod assist, +2 safety/equipment, Via lift equipment, From elevated surface           General transfer comment: EOB>Stedy>chair, minA to rise, modA for stand>sit due to decreased eccentric control to sit, +2 safety. Transfer via Lift Equipment: Stedy  Ambulation/Gait Ambulation/Gait assistance: Min assist, +2 safety/equipment   Assistive device:  Octaviano Belts)       Pre-gait activities: standing weight shift/slight hip flexion x10 reps in [place] frame prior to c/o fatigue and severe HA pain, pt then requesting to sit back in chair.     Stairs             Wheelchair Mobility     Tilt Bed    Modified Rankin (Stroke Patients Only)       Balance Overall balance assessment: Needs assistance Sitting-balance support: Feet supported, Bilateral upper extremity supported Sitting balance-Leahy Scale: Fair Sitting balance - Comments: Poor unsupported, Fair wtih BUE support  Standing balance support: Bilateral upper extremity supported, During functional activity, Reliant on assistive device for balance Standing balance-Leahy Scale: Poor Standing  balance comment: reliant on external device                            Communication Communication Communication: Impaired Factors Affecting Communication: Difficulty expressing self  Cognition Arousal: Alert Behavior During Therapy: Flat affect   PT - Cognitive impairments: Initiation, Sequencing, Safety/Judgement, Problem solving                       PT - Cognition Comments: Pt needs frequent check-ins regarding symptoms/activity tolerance; mild impulsivity  while seated Following commands: Intact Following commands impaired: Only follows one step commands consistently, Follows one step commands with increased time    Cueing Cueing Techniques: Verbal cues  Exercises General Exercises - Lower Extremity Hip Flexion/Marching: AROM, Strengthening, Both, Standing, 10 reps Other Exercises Other Exercises: seated pursed-lip breathing x10 reps Other Exercises: STS x 3 trials with BUE support    General Comments General comments (skin integrity, edema, etc.): BP stable 135/56 HR 75 bpm sitting EOB, BP 135/84 (99) seated in chair post-exertion HR 75 bpm      Pertinent Vitals/Pain Pain Assessment Pain Assessment: 0-10 Pain Score: 10-Worst pain ever Pain Location: Frontal headache Pain Descriptors / Indicators: Constant, Discomfort, Grimacing, Dull, Throbbing Pain Intervention(s): Limited activity within patient's tolerance, Monitored during session, Repositioned, Patient requesting pain meds-RN notified    Home Living                          Prior Function            PT Goals (current goals can now be found in the care plan section) Acute Rehab PT Goals Patient Stated Goal: Per spouse for him to get stronger before going home PT Goal Formulation: With patient/family Time For Goal Achievement: 03/17/24 Progress towards PT goals: Progressing toward goals    Frequency    Min 2X/week      PT Plan      Co-evaluation               AM-PAC PT 6 Clicks Mobility   Outcome Measure  Help needed turning from your back to your side while in a flat bed without using bedrails?: A Little Help needed moving from lying on your back to sitting on the side of a flat bed without using bedrails?: A Lot Help needed moving to and from a bed to a chair (including a wheelchair)?: A Lot Help needed standing up from a chair using your arms (e.g., wheelchair or bedside chair)?: A Lot Help needed to walk in hospital room?: Total Help needed climbing 3-5 steps with a railing? : Total 6 Click Score: 11    End of Session Equipment Utilized During Treatment: Gait belt Activity Tolerance: Patient tolerated treatment well;Patient limited by fatigue;Patient limited by pain (HA) Patient left: in chair;with call bell/phone within reach;with family/visitor present;with nursing/sitter in room (spouse in room with him, RN arriving to give him pain meds) Nurse Communication: Mobility status;Need for lift equipment;Patient requests pain meds;Other (comment) (+2 RW or stedy to pivot back to bed) PT Visit Diagnosis: Unsteadiness on feet (R26.81);Muscle weakness (generalized) (M62.81);History of falling (Z91.81);Difficulty in walking, not elsewhere classified (R26.2);Other symptoms and signs involving the nervous system (R29.898)     Time: 1610-9604 PT Time Calculation (min) (ACUTE ONLY): 23  min  Charges:    $Therapeutic Exercise: 8-22 mins $Therapeutic Activity: 8-22 mins PT General Charges $$ ACUTE PT VISIT: 1 Visit                     Sojourner Behringer P., PTA Acute Rehabilitation Services Secure Chat Preferred 9a-5:30pm Office: [phone]    Mariel Shope [facility] 03/05/2024, 5:50 PM

## 2024-03-05 NOTE — Progress Notes (Signed)
 Mobility Specialist: Progress Note   03/05/24 1528  Mobility  Activity Transferred to/from [facility]  Level of Assistance Minimal assist, patient does 75% or more  Assistive Device Front wheel walker  Distance Ambulated (ft) 3 ft  Activity Response Tolerated fair  Mobility Referral Yes  Mobility visit 1 Mobility  Mobility Specialist Start Time (ACUTE ONLY) 1520  Mobility Specialist Stop Time (ACUTE ONLY) 1525  Mobility Specialist Time Calculation (min) (ACUTE ONLY) 5 min    Pt received on BSC, requesting assistance back to bed. MinA for STS, +2 assistance from wife for pericare in standing. Completed pivot and backward steps back to bed. After about 6 steps, pt stated I can't do it and fell back onto the EOB. CG for sit>supine. Left in bed with all needs met, call bell in reach.   Deloria Fetch Mobility Specialist Please contact via SecureChat or Rehab office at [phone]

## 2024-03-05 NOTE — Plan of Care (Signed)

## 2024-03-05 NOTE — Progress Notes (Signed)
 Mobility Specialist: Progress Note   03/05/24 1518  Mobility  Activity Transferred to/from [facility]  Level of Assistance Minimal assist, patient does 75% or more  Assistive Device Front wheel walker  Activity Response Tolerated fair  Mobility Referral Yes  Mobility visit 1 Mobility  Mobility Specialist Start Time (ACUTE ONLY) 1509  Mobility Specialist Stop Time (ACUTE ONLY) 1513  Mobility Specialist Time Calculation (min) (ACUTE ONLY) 4 min    Pt requesting assistance to the [facility], received in chair. MinA for STS. Completed stand pivot to [facility] with minG. Left on [facility] with all needs met, call bell in reach.   Deloria Fetch Mobility Specialist Please contact via SecureChat or Rehab office at [phone]

## 2024-03-05 NOTE — TOC Progression Note (Signed)
 Transition of Care [facility]) - Progression Note    Patient Details  Name: [name] MRN: [id number] Date of Birth: 1961/07/30  [facility] CM/SW Contact  Ailish Prospero A [place], LCSW Phone Number: 03/05/2024, 1:01 PM  Clinical Narrative:     CSW spoke [name], pt's wife, and updated her on [name], no availability, in the coming days. She said that pt nor she would  want to go back to [facility] but as pt is nearly stable and since there are no beds, reluctant but accepting to go back to [facility].  She said she would discuss and update pt on placement and call back CSW if any more questions arose. CSW to follow up with [facility] about returning, checking if bed is available and starting authorization as pt is near medical stability.    TOC will continue to follow.   Expected Discharge Plan: Skilled Nursing Facility Barriers to Discharge: Continued Medical Work up, English as a second language teacher, SNF Pending bed offer  Expected Discharge Plan and Services                                               Social Determinants of Health (SDOH) Interventions SDOH Screenings   Food Insecurity: Patient Unable To Answer (02/29/2024)  Housing: Patient Unable To Answer (02/29/2024)  Transportation Needs: Patient Unable To Answer (02/29/2024)  Utilities: Patient Unable To Answer (02/29/2024)  Financial Resource Strain: Low Risk  (12/31/2022)   Received from [facility]  Physical Activity: Unknown (03/27/2023)   Received from [facility]  Social Connections: Patient Unable To Answer (02/29/2024)  Stress: Stress Concern Present (03/27/2023)   Received from [facility]  Tobacco Use: High Risk (02/29/2024)    Readmission Risk Interventions     No data to display

## 2024-03-05 NOTE — Progress Notes (Signed)
 PROGRESS NOTE    [name]  NWG:[id number] DOB: 1961-02-08 DOA: 02/28/2024 PCP: Danny Cruz, MD  Chief Complaint  Patient presents with   Rectal Bleeding   Rectal Pain    Brief Narrative:   63 year old with [name] history of COPD with tobacco abuse, chronic hypoxia requiring 3 L nasal cannula with activity, HTN, hypothyroidism, depression/anxiety, right upper extremity DVT on low-dose Lovenox , and [name] recent traumatic right subdural hematoma complicated by seizure activity requiring right sided bur holes for evacuation 02/18/2024 (D/C to SNF 02/24/24) who was transported to the ER from his SNF with rectal bleeding.  He was enjoying [name] meal at the SNF when he developed the acute onset of left lower quadrant abdominal pain followed by an episode of bright red blood per rectum.  In the ER his blood pressure was stable.  Hemoglobin was found to be 12.2.  He was guaiac positive.  INR was 1.1.  CT of the abdomen/pelvis noted circumferential wall thickening of the descending colon and sigmoid colon compatible with infectious or inflammatory colitis.  CT head revealed [name] stable right sided SDH.   Assessment & Plan:   Principal Problem:   Acute lower GI bleeding Active Problems:   Colitis   Subdural hematoma (HCC)   Deep vein thrombosis (DVT) of right upper extremity (HCC)   Seizure disorder (HCC)   Essential hypertension, benign   Hypothyroidism   COPD (chronic obstructive pulmonary disease) (HCC)   GIB (gastrointestinal bleeding)   Ischemic colitis (HCC)   Rectal bleeding   Abdominal pain  BRBPR Likely due to combination of Lovenox  and acute ischemic colitis  hemoglobin stable at this time, resolved resumed prophylactic dose Lovenox  6/8   Acute colitis of descending and sigmoid colon - likely ischemic in nature 6/6 CT angio abd/pelvis with circumferential wall thickening involving the descending colon and sigmoid colon  Seen by GI, suspected ischemic colitis - recommending colonoscopy  outpatient with primary GI MD Stool studies ordered, but appear to have been canceled, likely due to lack of diarrhea?  Abx were discontinued with resolution of diarrhea Repeat CT 6/11 with findings c/w colitis (some improvement in proximal and mid descending colon    Traumatic subdural hematoma requiring right sided bur hole for evacuation 02/18/2024 Intractable Headache  Follow-up CT head notes stable 8 mm low-density right cerebral SDH - operative drainage performed by Dr. Andy Matthews 02/18/2024  ? If nsgy saw him 6/10 (sounds like they did, staples removed - discussed with Dr. Andy Matthews) - will reach out to discuss his chronic headaches (may have headaches up to 6 weeks or so), opiates not great option long term with risk of rebound   Right upper extremity DVT related to PICC line PICC line was removed when the DVT was noted during recent hospital stay  He'd been on prophylactic dose anticoagulation after discussion with NSGY US  without DVT in upper extremity 6/9 (5/23 had DVT in R axillary and brachial veins) Will discuss anticoagulation options, but based on my conversation with patient's wife, they don't seem particularly interested in resuming anticoagulation.  Discussed that would technically recommend 3 months anticoagulation given his catheter related DVT (even with US  showing resolution).  Suggestion to use ppx dosing for 3 months noted, will discuss further with wife who has some hesitation.   Seizure disorder Due to above - continue Vimpat  and phenobarbital  -no clinical evidence of seizure activity during this admission   COPD with chronic hypoxic respiratory failure Requires 3 L O2 with activity at baseline (currently  on room air) - stable at present - continue usual meds   HTN Blood pressure modestly elevated with systolics 150-160 -gently adjust medical therapy and follow   Severe hypokalemia Improved, follow    Hypomagnesemia Replace and follow    Hypothyroidism Continue usual  Synthroid  dose   Depression/anxiety Continue usual Celexa  and Xanax  doses    DVT prophylaxis: lovenox  Code Status: full Family Communication: wife at bedside Disposition:   Status is: Inpatient Remains inpatient appropriate because: need for continued inpatient care   Consultants:  NSGY  Procedures:  gastroenterology  Antimicrobials:  Anti-infectives (From admission, onward)    Start     Dose/Rate Route Frequency Ordered Stop   02/28/24 2100  cefTRIAXone  (ROCEPHIN ) 2 g in sodium chloride  0.9 % 100 mL IVPB  Status:  Discontinued        2 g 200 mL/hr over 30 Minutes Intravenous Every 24 hours 02/28/24 2053 03/02/24 1529   02/28/24 2100  metroNIDAZOLE  (FLAGYL ) IVPB 500 mg  Status:  Discontinued        500 mg 100 mL/hr over 60 Minutes Intravenous Every 12 hours 02/28/24 2053 03/02/24 1529       Subjective: Continues to c/o headache and abdominal pain  Objective: Vitals:   03/04/24 1606 03/04/24 2044 03/05/24 0509 03/05/24 0806  BP: 136/60 (!) 126/59 (!) 150/63 (!) 152/62  Pulse: 70 70 67 69  Resp: 18 17 17 19   Temp: 98.5 F (36.9 C) 98.5 F (36.9 C) 97.9 F (36.6 C) 98.4 F (36.9 C)  TempSrc:  Oral Oral Oral  SpO2: 95% 95% 94% 94%  Weight:      Height:        Intake/Output Summary (Last 24 hours) at 03/05/2024 1431 Last data filed at 03/05/2024 0902 Gross per 24 hour  Intake 464 ml  Output 1250 ml  Net -786 ml   Filed Weights   02/28/24 1445  Weight: 83 kg    Examination:  General: No acute distress. Cardiovascular: RRR Lungs: unlabored Abdomen: less TTP today, no distended, no rebound or guarding Neurological: Alert and oriented 3. Moves all extremities 4 with equal strength. Cranial nerves II through XII grossly intact. Extremities: No clubbing or cyanosis. No edema.   Data Reviewed: I have personally reviewed following labs and imaging studies  CBC: Recent Labs  Lab 02/28/24 1531 02/29/24 0510 03/01/24 0701 03/02/24 0427  03/03/24 0349 03/04/24 0344 03/05/24 0346  WBC 13.7*   < > 11.0* 10.9* 9.0 10.0 9.4  NEUTROABS 11.2*  --   --   --   --   --  5.0  HGB 12.2*   < > 10.6* 9.2* 9.2* 9.2* 9.2*  HCT 36.3*   < > 32.8* 28.7* 28.3* 28.9* 29.2*  MCV 101.4*   < > 101.5* 103.2* 102.9* 103.2* 102.5*  PLT 286   < > 262 292 306 324 335   < > = values in this interval not displayed.    Basic Metabolic Panel: Recent Labs  Lab 03/01/24 0701 03/02/24 0427 03/03/24 0349 03/04/24 0344 03/05/24 0346  NA 131* 136 136 138 136  K 2.9* 3.1* 3.3* 3.8 4.4  CL 102 106 106 106 104  CO2 19* 22 24 27 26   GLUCOSE 141* 122* 114* 119* 115*  BUN 11 6* 5* 10 8  CREATININE 0.48* 0.62 0.53* 0.63 0.54*  CALCIUM  7.8* 8.2* 7.9* 8.4* 8.4*  MG 1.7 1.7 1.6* 1.6* 1.7  PHOS  --   --   --   --  3.1    GFR: Estimated Creatinine Clearance: 93.1 mL/min (Deniel Mcquiston) (by C-G formula based on SCr of 0.54 mg/dL (L)).  Liver Function Tests: Recent Labs  Lab 02/28/24 1531 03/05/24 0346  AST 37 34  ALT 55* 40  ALKPHOS 97 94  BILITOT 0.9 0.5  PROT 7.4 5.9*  ALBUMIN 3.6 2.6*    CBG: No results for input(s): GLUCAP in the last 168 hours.   No results found for this or any previous visit (from the past 240 hours).       Radiology Studies: CT ABDOMEN PELVIS W CONTRAST Result Date: 03/04/2024 CLINICAL DATA:  Abdominal pain EXAM: CT ABDOMEN AND PELVIS WITH CONTRAST TECHNIQUE: Multidetector CT imaging of the abdomen and pelvis was performed using the standard protocol following bolus administration of intravenous contrast. RADIATION DOSE REDUCTION: This exam was performed according to the departmental dose-optimization program which includes automated exposure control, adjustment of the mA and/or kV according to patient size and/or use of iterative reconstruction technique. CONTRAST:  75mL OMNIPAQUE  IOHEXOL  350 MG/ML SOLN COMPARISON:  02/28/2024 FINDINGS: Lower chest: No acute findings Hepatobiliary: No focal hepatic abnormality. Gallbladder  unremarkable. Pancreas: No focal abnormality or ductal dilatation. Spleen: No focal abnormality.  Normal size. Adrenals/Urinary Tract: No adrenal abnormality. No focal renal abnormality. No stones or hydronephrosis. Urinary bladder is unremarkable. Stomach/Bowel: There is circumferential wall thickening involving the distal descending colon and much of the sigmoid colon compatible with colitis. This has Jamorris Ndiaye similar appearance to prior study except that less of the descending colon is involved. No bowel obstruction. Stomach and small bowel unremarkable. Vascular/Lymphatic: Aortic atherosclerosis. No evidence of aneurysm or adenopathy. Reproductive: No visible focal abnormality. Other: No free fluid or free air. Musculoskeletal: No acute bony abnormality. Again noted is soft tissue stranding overlying the left hip laterally, similar prior study which may be related to prior trauma. IMPRESSION: Continued wall thickening involving the distal descending colon and sigmoid colon compatible with colitis. Some improvement noted in the proximal and mid descending colon. Otherwise unchanged. No surrounding fluid collection to suggest abscess. Aortic atherosclerosis. Continued stranding over the left lateral hip in the overlying soft tissues, possibly related to recent trauma. Electronically Signed   By: Janeece Mechanic M.D.   On: 03/04/2024 17:19        Scheduled Meds:  acetaminophen   1,000 mg Oral Q8H   ALPRAZolam   0.5 mg Oral BID   amLODipine  10 mg Oral Daily   Chlorhexidine  Gluconate Cloth  6 each Topical Daily   citalopram   20 mg Oral Daily   enoxaparin  (LOVENOX ) injection  40 mg Subcutaneous Q24H   feeding supplement  237 mL Oral BID BM   lacosamide   200 mg Oral BID   levothyroxine   150 mcg Oral QAC breakfast   metoprolol  tartrate  50 mg Oral BID   PHENobarbital   64.8 mg Oral BID   potassium chloride   40 mEq Oral BID   sodium chloride  flush  10-40 mL Intracatheter Q12H   sodium chloride  flush  3 mL  Intravenous Q12H   umeclidinium bromide   1 puff Inhalation Daily   Continuous Infusions:   LOS: 6 days    Time spent: over 30 min     Donnetta Gains, MD [facility]   To contact the attending provider between 7A-7P or the covering provider during after hours 7P-7A, please log into the web site [email or web address] and access using universal [place] password for that web site. If you do not have the password, please call the hospital  operator.  03/05/2024, 2:31 PM

## 2024-03-06 DIAGNOSIS — K922 Gastrointestinal hemorrhage, unspecified: Secondary | ICD-10-CM | POA: Diagnosis not present

## 2024-03-06 LAB — CBC WITH DIFFERENTIAL/PLATELET
Abs Immature Granulocytes: 0.18 10*3/uL — ABNORMAL HIGH (ref 0.00–0.07)
Basophils Absolute: 0.1 10*3/uL (ref 0.0–0.1)
Basophils Relative: 1 %
Eosinophils Absolute: 0.1 10*3/uL (ref 0.0–0.5)
Eosinophils Relative: 1 %
HCT: 29 % — ABNORMAL LOW (ref 39.0–52.0)
Hemoglobin: 9.3 g/dL — ABNORMAL LOW (ref 13.0–17.0)
Immature Granulocytes: 2 %
Lymphocytes Relative: 36 %
Lymphs Abs: 3.5 10*3/uL (ref 0.7–4.0)
MCH: 33 pg (ref 26.0–34.0)
MCHC: 32.1 g/dL (ref 30.0–36.0)
MCV: 102.8 fL — ABNORMAL HIGH (ref 80.0–100.0)
Monocytes Absolute: 1.3 10*3/uL — ABNORMAL HIGH (ref 0.1–1.0)
Monocytes Relative: 13 %
Neutro Abs: 4.7 10*3/uL (ref 1.7–7.7)
Neutrophils Relative %: 47 %
Platelets: 368 10*3/uL (ref 150–400)
RBC: 2.82 MIL/uL — ABNORMAL LOW (ref 4.22–5.81)
RDW: 17.2 % — ABNORMAL HIGH (ref 11.5–15.5)
WBC: 9.8 10*3/uL (ref 4.0–10.5)
nRBC: 0.5 % — ABNORMAL HIGH (ref 0.0–0.2)

## 2024-03-06 LAB — COMPREHENSIVE METABOLIC PANEL WITH GFR
ALT: 59 U/L — ABNORMAL HIGH (ref 0–44)
AST: 50 U/L — ABNORMAL HIGH (ref 15–41)
Albumin: 2.5 g/dL — ABNORMAL LOW (ref 3.5–5.0)
Alkaline Phosphatase: 105 U/L (ref 38–126)
Anion gap: 10 (ref 5–15)
BUN: 15 mg/dL (ref 8–23)
CO2: 25 mmol/L (ref 22–32)
Calcium: 8.8 mg/dL — ABNORMAL LOW (ref 8.9–10.3)
Chloride: 100 mmol/L (ref 98–111)
Creatinine, Ser: 0.78 mg/dL (ref 0.61–1.24)
GFR, Estimated: >60 mL/min (ref >60)
Glucose, Bld: 129 mg/dL — ABNORMAL HIGH (ref 70–99)
Potassium: 4 mmol/L (ref 3.5–5.1)
Sodium: 135 mmol/L (ref 135–145)
Total Bilirubin: 0.4 mg/dL (ref 0.0–1.2)
Total Protein: 5.9 g/dL — ABNORMAL LOW (ref 6.5–8.1)

## 2024-03-06 LAB — MAGNESIUM: Magnesium: 1.5 mg/dL — ABNORMAL LOW (ref 1.7–2.4)

## 2024-03-06 LAB — PHOSPHORUS: Phosphorus: 4.2 mg/dL (ref 2.5–4.6)

## 2024-03-06 MED ORDER — NICOTINE 21 MG/24HR TD PT24
21.0000 mg | MEDICATED_PATCH | Freq: Every day | TRANSDERMAL | Status: DC
  Administered 2024-03-06 – 2024-03-08 (×3): 21 mg via TRANSDERMAL
  Filled 2024-03-06 (×3): qty 1

## 2024-03-06 MED ORDER — MAGNESIUM SULFATE 2 GM/50ML IV SOLN
2.0000 g | Freq: Once | INTRAVENOUS | Status: AC
  Administered 2024-03-06: 2 g via INTRAVENOUS
  Filled 2024-03-06: qty 50

## 2024-03-06 NOTE — Plan of Care (Signed)
  Problem: Education: Goal: Knowledge of General Education information will improve Description: Including pain rating scale, medication(s)/side effects and non-pharmacologic comfort measures Outcome: Progressing   Problem: Clinical Measurements: Goal: Cardiovascular complication will be avoided Outcome: Progressing   Problem: Activity: Goal: Risk for activity intolerance will decrease Outcome: Progressing   Problem: Nutrition: Goal: Adequate nutrition will be maintained Outcome: Progressing   Problem: Coping: Goal: Level of anxiety will decrease Outcome: Progressing   Problem: Elimination: Goal: Will not experience complications related to urinary retention Outcome: Progressing   Problem: Safety: Goal: Ability to remain free from injury will improve Outcome: Progressing

## 2024-03-06 NOTE — Progress Notes (Signed)
 PROGRESS NOTE    [name]  BJY:[id number] DOB: 06/21/1961 DOA: 02/28/2024 PCP: Claudell Cruz, MD  Chief Complaint  Patient presents with   Rectal Bleeding   Rectal Pain    Brief Narrative:   63 year old with Der Gagliano history of COPD with tobacco abuse, chronic hypoxia requiring 3 L nasal cannula with activity, HTN, hypothyroidism, depression/anxiety, right upper extremity DVT on low-dose Lovenox , and Anyia Gierke recent traumatic right subdural hematoma complicated by seizure activity requiring right sided bur holes for evacuation 02/18/2024 (D/C to SNF 02/24/24) who was transported to the ER from his SNF with rectal bleeding.  He was enjoying Arcangel Minion meal at the SNF when he developed the acute onset of left lower quadrant abdominal pain followed by an episode of bright red blood per rectum.  In the ER his blood pressure was stable.  Hemoglobin was found to be 12.2.  He was guaiac positive.  INR was 1.1.  CT of the abdomen/pelvis noted circumferential wall thickening of the descending colon and sigmoid colon compatible with infectious or inflammatory colitis.  CT head revealed [name] stable right sided SDH.   Assessment & Plan:   Principal Problem:   Acute lower GI bleeding Active Problems:   Colitis   Subdural hematoma (HCC)   Deep vein thrombosis (DVT) of right upper extremity (HCC)   Seizure disorder (HCC)   Essential hypertension, benign   Hypothyroidism   COPD (chronic obstructive pulmonary disease) (HCC)   GIB (gastrointestinal bleeding)   Ischemic colitis (HCC)   Rectal bleeding   Abdominal pain  BRBPR Likely due to combination of Lovenox  and acute ischemic colitis  hemoglobin stable at this time, resolved resumed prophylactic dose Lovenox  6/8   Acute colitis of descending and sigmoid colon - likely ischemic in nature 6/6 CT angio abd/pelvis with circumferential wall thickening involving the descending colon and sigmoid colon  Seen by GI, suspected ischemic colitis - recommending colonoscopy  outpatient with primary GI MD Stool studies ordered, but appear to have been canceled, likely due to lack of diarrhea?  Abx were discontinued with resolution of diarrhea Repeat CT 6/11 with findings c/w colitis (some improvement in proximal and mid descending colon  He's improving    Traumatic subdural hematoma requiring right sided bur hole for evacuation 02/18/2024 Intractable Headache  Follow-up CT head notes stable 8 mm low-density right cerebral SDH - operative drainage performed by Dr. [name] 02/18/2024  ? If nsgy saw him 6/10 (sounds like they did, staples removed - discussed with Dr. [name]) - will reach out to discuss his chronic headaches (may have headaches up to 6 weeks or so), opiates not great option long term with risk of rebound   Right upper extremity DVT related to PICC line PICC line was removed when the DVT was noted during recent hospital stay  He'd been on prophylactic dose anticoagulation after discussion with NSGY US  without DVT in upper extremity 6/9 (5/23 had DVT in R axillary and brachial veins) Will discuss anticoagulation options, but based on my conversation with patient's wife, they don't seem particularly interested in resuming anticoagulation.  Discussed that would technically recommend 3 months anticoagulation given his catheter related DVT (even with US  showing resolution).  Suggestion to use ppx dosing for 3 months noted, wife is agreeable to this.     Seizure disorder Due to above - continue Vimpat  and phenobarbital  -no clinical evidence of seizure activity during this admission   COPD with chronic hypoxic respiratory failure Requires 3 L O2 with activity at baseline (  currently on room air) - stable at present - continue usual meds   HTN BP fluctuating, will monitor  Continue on metop    Severe hypokalemia Improved, follow    Hypomagnesemia Replace and follow    Hypothyroidism Continue usual Synthroid  dose   Depression/anxiety Continue usual  Celexa  and Xanax  doses    DVT prophylaxis: lovenox  Code Status: full Family Communication: wife at bedside Disposition:   Status is: Inpatient Remains inpatient appropriate because: need for continued inpatient care   Consultants:  NSGY  Procedures:  gastroenterology  Antimicrobials:  Anti-infectives (From admission, onward)    Start     Dose/Rate Route Frequency Ordered Stop   02/28/24 2100  cefTRIAXone  (ROCEPHIN ) 2 g in sodium chloride  0.9 % 100 mL IVPB  Status:  Discontinued        2 g 200 mL/hr over 30 Minutes Intravenous Every 24 hours 02/28/24 2053 03/02/24 1529   02/28/24 2100  metroNIDAZOLE  (FLAGYL ) IVPB 500 mg  Status:  Discontinued        500 mg 100 mL/hr over 60 Minutes Intravenous Every 12 hours 02/28/24 2053 03/02/24 1529       Subjective: Continues to complain of HA Abd pain is better  Objective: Vitals:   03/05/24 2022 03/06/24 0454 03/06/24 0738 03/06/24 0741  BP: 131/63 125/74 (!) 147/72   Pulse: 75 66 69   Resp: 16 18 18    Temp: 98.4 F (36.9 C) 98.7 F (37.1 C) 98.1 F (36.7 C)   TempSrc: Oral Oral Oral   SpO2: 95% 96% 97% 97%  Weight:      Height:        Intake/Output Summary (Last 24 hours) at 03/06/2024 1414 Last data filed at 03/06/2024 1153 Gross per 24 hour  Intake --  Output 2750 ml  Net -2750 ml   Filed Weights   02/28/24 1445  Weight: 83 kg    Examination:  General: No acute distress.  Sitting up in chair  Cardiovascular: RRR Lungs: unlabored Abdomen: Soft, nontender.  Discomfort seems improved over past few days.   Neurological: Alert and oriented 3. Moves all extremities 4 with equal strength. Cranial nerves II through XII grossly intact. Extremities: No clubbing or cyanosis. No edema.    Data Reviewed: I have personally reviewed following labs and imaging studies  CBC: Recent Labs  Lab 02/28/24 1531 02/29/24 0510 03/02/24 0427 03/03/24 0349 03/04/24 0344 03/05/24 0346 03/06/24 0055  WBC 13.7*   < >  10.9* 9.0 10.0 9.4 9.8  NEUTROABS 11.2*  --   --   --   --  5.0 4.7  HGB 12.2*   < > 9.2* 9.2* 9.2* 9.2* 9.3*  HCT 36.3*   < > 28.7* 28.3* 28.9* 29.2* 29.0*  MCV 101.4*   < > 103.2* 102.9* 103.2* 102.5* 102.8*  PLT 286   < > 292 306 324 335 368   < > = values in this interval not displayed.    Basic Metabolic Panel: Recent Labs  Lab 03/02/24 0427 03/03/24 0349 03/04/24 0344 03/05/24 0346 03/06/24 0055  NA 136 136 138 136 135  K 3.1* 3.3* 3.8 4.4 4.0  CL 106 106 106 104 100  CO2 22 24 27 26 25   GLUCOSE 122* 114* 119* 115* 129*  BUN 6* 5* 10 8 15   CREATININE 0.62 0.53* 0.63 0.54* 0.78  CALCIUM  8.2* 7.9* 8.4* 8.4* 8.8*  MG 1.7 1.6* 1.6* 1.7 1.5*  PHOS  --   --   --  3.1 4.2  GFR: Estimated Creatinine Clearance: 93.1 mL/min (by C-G formula based on SCr of 0.78 mg/dL).  Liver Function Tests: Recent Labs  Lab 02/28/24 1531 03/05/24 0346 03/06/24 0055  AST 37 34 50*  ALT 55* 40 59*  ALKPHOS 97 94 105  BILITOT 0.9 0.5 0.4  PROT 7.4 5.9* 5.9*  ALBUMIN 3.6 2.6* 2.5*    CBG: No results for input(s): GLUCAP in the last 168 hours.   No results found for this or any previous visit (from the past 240 hours).       Radiology Studies: No results found.       Scheduled Meds:  acetaminophen   1,000 mg Oral Q8H   ALPRAZolam   0.5 mg Oral BID   amLODipine   10 mg Oral Daily   Chlorhexidine  Gluconate Cloth  6 each Topical Daily   citalopram   20 mg Oral Daily   enoxaparin  (LOVENOX ) injection  40 mg Subcutaneous Q24H   feeding supplement  237 mL Oral BID BM   lacosamide   200 mg Oral BID   levothyroxine   150 mcg Oral QAC breakfast   metoprolol  tartrate  50 mg Oral BID   nicotine   21 mg Transdermal Daily   PHENobarbital   64.8 mg Oral BID   potassium chloride   40 mEq Oral BID   sodium chloride  flush  10-40 mL Intracatheter Q12H   sodium chloride  flush  3 mL Intravenous Q12H   umeclidinium bromide   1 puff Inhalation Daily   Continuous Infusions:   LOS: 7 days     Time spent: over 30 min     Donnetta Gains, MD [facility]   To contact the attending provider between 7A-7P or the covering provider during after hours 7P-7A, please log into the web site [email or web address] and access using universal [place] password for that web site. If you do not have the password, please call the hospital operator.  03/06/2024, 2:14 PM
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
## 2024-03-06 NOTE — Progress Notes (Signed)
 Mobility Specialist: Progress Note   03/06/24 1602  Mobility  Activity Transferred from chair to bed  Level of Assistance Moderate assist, patient does 50-74%  Assistive Device Front wheel walker  Activity Response Tolerated well  Mobility Referral Yes  Mobility visit 1 Mobility  Mobility Specialist Start Time (ACUTE ONLY) 1415  Mobility Specialist Stop Time (ACUTE ONLY) 1420  Mobility Specialist Time Calculation (min) (ACUTE ONLY) 5 min    Pt received in chair, requesting assistance back to bed. ModA for STS, minA for stand pivot to bed. CG for bed mobility to assist BLEs. Left in bed with all needs met, call bell in reach. Wife present.   Deloria Fetch Mobility Specialist Please contact via SecureChat or Rehab office at [phone]

## 2024-03-06 NOTE — Progress Notes (Signed)
 Mobility Specialist: Progress Note   03/06/24 1600  Mobility  Activity Transferred from bed to chair  Level of Assistance Minimal assist, patient does 75% or more  Assistive Device Front wheel walker  Activity Response Tolerated well  Mobility Referral Yes  Mobility visit 1 Mobility  Mobility Specialist Start Time (ACUTE ONLY) 1119  Mobility Specialist Stop Time (ACUTE ONLY) 1130  Mobility Specialist Time Calculation (min) (ACUTE ONLY) 11 min    Pt received in bed, agreeable to mobility session with encouragement. SV for bed mobility. MinA for STS and stand pivot to chair. C/o 10/10 dizziness. Left in chair with all needs met, call bell in reach. Wife present.   Deloria Fetch Mobility Specialist Please contact via SecureChat or Rehab office at [phone]

## 2024-03-06 NOTE — TOC Progression Note (Signed)
 Transition of Care [facility]) - Progression Note    Patient Details  Name: [name] MRN: [id number] Date of Birth: 12-May-1961  Transition of Care [facility]) CM/SW Contact  Lakeshia Dohner A [place], LCSW Phone Number: 03/06/2024, 12:28 PM  Clinical Narrative:     CSW spoke with pt's wife [name] and [facility] regarding discharge. She now has decided that she cannot assist pt at home due to physical limitations. Stated she would be ok with [facility] for pt's placement to rehab. She said that pt has to be agreeable since she is the only caregiver to assist him at home at this time and other placement has fallen through.   CSW notified Tammy at [facility] of acceptance of bed offer and requested start of insurance authorization.   TOC contact information for CSW and RNCM provided to continue to assist with disposition needs.   TOC will continue to follow.   Expected Discharge Plan: Home w Home Health Services Barriers to Discharge: No Barriers Identified  Expected Discharge Plan and Services   Discharge Planning Services: CM Consult Post Acute Care Choice: Home Health                   DME Arranged: Wheelchair manual, Bedside commode DME Agency: [employer] Date DME Agency Contacted: 03/06/24 Time DME Agency Contacted: 1154 Representative spoke with at DME Agency: [employer] HH Arranged: PT, OT, Nurse's Aide, RN, Social Work [employer] Agency: [facility] Date HH Agency Contacted: 03/06/24 Time HH Agency Contacted: 1155 Representative spoke with at [facility] Agency: Imelda Man   Social Determinants of Health (SDOH) Interventions SDOH Screenings   Food Insecurity: Patient Unable To Answer (02/29/2024)  Housing: Patient Unable To Answer (02/29/2024)  Transportation Needs: Patient Unable To Answer (02/29/2024)  Utilities: Patient Unable To Answer (02/29/2024)  Financial Resource Strain: Low Risk  (12/31/2022)   Received from [facility]  Physical Activity: Unknown (03/27/2023)   Received from [facility]  Social Connections: Patient Unable To Answer (02/29/2024)  Stress: Stress Concern Present (03/27/2023)   Received from [facility]  Tobacco Use: High Risk (02/29/2024)    Readmission Risk Interventions     No data to display

## 2024-03-07 ENCOUNTER — Encounter (HOSPITAL_COMMUNITY): Payer: Self-pay | Admitting: Internal Medicine

## 2024-03-07 DIAGNOSIS — K922 Gastrointestinal hemorrhage, unspecified: Secondary | ICD-10-CM | POA: Diagnosis not present

## 2024-03-07 MED ORDER — PROCHLORPERAZINE EDISYLATE 10 MG/2ML IJ SOLN
10.0000 mg | Freq: Once | INTRAMUSCULAR | Status: AC | PRN
  Administered 2024-03-08: 10 mg via INTRAVENOUS
  Filled 2024-03-07: qty 2

## 2024-03-07 MED ORDER — DIPHENHYDRAMINE HCL 25 MG PO CAPS
25.0000 mg | ORAL_CAPSULE | Freq: Once | ORAL | Status: AC | PRN
  Administered 2024-03-08: 25 mg via ORAL
  Filled 2024-03-07: qty 1

## 2024-03-07 MED ORDER — LACTATED RINGERS IV BOLUS
250.0000 mL | Freq: Once | INTRAVENOUS | Status: AC | PRN
  Administered 2024-03-08: 250 mL via INTRAVENOUS

## 2024-03-07 NOTE — Plan of Care (Signed)

## 2024-03-07 NOTE — Progress Notes (Signed)
 Psychosocial Progressive/Outcome: ANOx4, calm and cooperative  Wife at bedside   Pain/Comfort Progression/Outcome: Pt did not complain of pain during shift   Clinical Progression/Outcome: Adequate fluid and diet intake  Encourage pt to turn and reposition Voids to male cath  Had BM Slept in between care Maintained safety

## 2024-03-07 NOTE — Progress Notes (Signed)
 PROGRESS NOTE    [name]  EXB:[id number] DOB: August 22, 1961 DOA: 02/28/2024 PCP: Danny Cruz, MD  Chief Complaint  Patient presents with   Rectal Bleeding   Rectal Pain    Brief Narrative:   63 year old with [name] history of COPD with tobacco abuse, chronic hypoxia requiring 3 L nasal cannula with activity, HTN, hypothyroidism, depression/anxiety, right upper extremity DVT on low-dose Lovenox , and [name] recent traumatic right subdural hematoma complicated by seizure activity requiring right sided bur holes for evacuation 02/18/2024 (D/C to SNF 02/24/24) who was transported to the ER from his SNF with rectal bleeding.  He was enjoying [name] meal at the SNF when he developed the acute onset of left lower quadrant abdominal pain followed by an episode of bright red blood per rectum.  In the ER his blood pressure was stable.  Hemoglobin was found to be 12.2.  He was guaiac positive.  INR was 1.1.  CT of the abdomen/pelvis noted circumferential wall thickening of the descending colon and sigmoid colon compatible with infectious or inflammatory colitis.  CT head revealed [name] stable right sided SDH.   Assessment & Plan:   Principal Problem:   Acute lower GI bleeding Active Problems:   Colitis   Subdural hematoma (HCC)   Deep vein thrombosis (DVT) of right upper extremity (HCC)   Seizure disorder (HCC)   Essential hypertension, benign   Hypothyroidism   COPD (chronic obstructive pulmonary disease) (HCC)   GIB (gastrointestinal bleeding)   Ischemic colitis (HCC)   Rectal bleeding   Abdominal pain  BRBPR Likely due to combination of Lovenox  and acute ischemic colitis  hemoglobin stable at this time, resolved resumed prophylactic dose Lovenox  6/8   Acute colitis of descending and sigmoid colon - likely ischemic in nature 6/6 CT angio abd/pelvis with circumferential wall thickening involving the descending colon and sigmoid colon  Seen by GI, suspected ischemic colitis - recommending colonoscopy  outpatient with primary GI MD Stool studies ordered, but appear to have been canceled, likely due to lack of diarrhea?  Abx were discontinued with resolution of diarrhea Repeat CT 6/11 with findings c/w colitis (some improvement in proximal and mid descending colon  He's improving    Traumatic subdural hematoma requiring right sided bur hole for evacuation 02/18/2024 Intractable Headache  Follow-up CT head notes stable 8 mm low-density right cerebral SDH - operative drainage performed by Dr. Andy Matthews 02/18/2024  ? If nsgy saw him 6/10 (sounds like they did, staples removed - discussed with Dr. Andy Matthews) - will reach out to discuss his chronic headaches (may have headaches up to 6 weeks or so), opiates not great option long term with risk of rebound Trial headache cocktail    Right upper extremity DVT related to PICC line PICC line was removed when the DVT was noted during recent hospital stay  He'd been on prophylactic dose anticoagulation after discussion with NSGY US  without DVT in upper extremity 6/9 (5/23 had DVT in R axillary and brachial veins) Will discuss anticoagulation options, but based on my conversation with patient's wife, they don't seem particularly interested in resuming anticoagulation.  Discussed that would technically recommend 3 months anticoagulation given his catheter related DVT (even with US  showing resolution).  Suggestion to use ppx dosing for 3 months noted, wife is agreeable to this.     Seizure disorder Due to above - continue Vimpat  and phenobarbital  -no clinical evidence of seizure activity during this admission   COPD with chronic hypoxic respiratory failure Requires 3 L O2  with activity at baseline (currently on room air) - stable at present - continue usual meds   HTN BP fluctuating, will monitor  Continue on metop    Severe hypokalemia Improved, follow    Hypomagnesemia Replace and follow    Hypothyroidism Continue usual Synthroid  dose    Depression/anxiety Continue usual Celexa  and Xanax  doses    DVT prophylaxis: lovenox  Code Status: full Family Communication: wife at bedside Disposition:   Status is: Inpatient Remains inpatient appropriate because: need for continued inpatient care   Consultants:  NSGY  Procedures:  gastroenterology  Antimicrobials:  Anti-infectives (From admission, onward)    Start     Dose/Rate Route Frequency Ordered Stop   02/28/24 2100  cefTRIAXone  (ROCEPHIN ) 2 g in sodium chloride  0.9 % 100 mL IVPB  Status:  Discontinued        2 g 200 mL/hr over 30 Minutes Intravenous Every 24 hours 02/28/24 2053 03/02/24 1529   02/28/24 2100  metroNIDAZOLE  (FLAGYL ) IVPB 500 mg  Status:  Discontinued        500 mg 100 mL/hr over 60 Minutes Intravenous Every 12 hours 02/28/24 2053 03/02/24 1529       Subjective: Persistent HA, abdominal pain improving   Objective: Vitals:   03/06/24 2016 03/07/24 0500 03/07/24 0823 03/07/24 0953  BP: 119/61 (!) 168/56 (!) 159/70 (!) 159/70  Pulse: 83 69 67   Resp:   17   Temp: 97.8 F (36.6 C) 98.6 F (37 C) 97.8 F (36.6 C)   TempSrc: Axillary Axillary    SpO2: 98% 96% 98%   Weight:      Height:        Intake/Output Summary (Last 24 hours) at 03/07/2024 1420 Last data filed at 03/07/2024 0939 Gross per 24 hour  Intake --  Output 900 ml  Net -900 ml   Filed Weights   02/28/24 1445  Weight: 83 kg    Examination:  General: No acute distress. Cardiovascular: RRR Lungs: unlabored Abdomen: Soft, nontender, nondistended Neurological: Alert and oriented 3. Moves all extremities 4 with equal strength. Cranial nerves II through XII grossly intact. Extremities: No clubbing or cyanosis. No edema.   Data Reviewed: I have personally reviewed following labs and imaging studies  CBC: Recent Labs  Lab 03/02/24 0427 03/03/24 0349 03/04/24 0344 03/05/24 0346 03/06/24 0055  WBC 10.9* 9.0 10.0 9.4 9.8  NEUTROABS  --   --   --  5.0 4.7  HGB  9.2* 9.2* 9.2* 9.2* 9.3*  HCT 28.7* 28.3* 28.9* 29.2* 29.0*  MCV 103.2* 102.9* 103.2* 102.5* 102.8*  PLT 292 306 324 335 368    Basic Metabolic Panel: Recent Labs  Lab 03/02/24 0427 03/03/24 0349 03/04/24 0344 03/05/24 0346 03/06/24 0055  NA 136 136 138 136 135  K 3.1* 3.3* 3.8 4.4 4.0  CL 106 106 106 104 100  CO2 22 24 27 26 25   GLUCOSE 122* 114* 119* 115* 129*  BUN 6* 5* 10 8 15   CREATININE 0.62 0.53* 0.63 0.54* 0.78  CALCIUM  8.2* 7.9* 8.4* 8.4* 8.8*  MG 1.7 1.6* 1.6* 1.7 1.5*  PHOS  --   --   --  3.1 4.2    GFR: Estimated Creatinine Clearance: 93.1 mL/min (by C-G formula based on SCr of 0.78 mg/dL).  Liver Function Tests: Recent Labs  Lab 03/05/24 0346 03/06/24 0055  AST 34 50*  ALT 40 59*  ALKPHOS 94 105  BILITOT 0.5 0.4  PROT 5.9* 5.9*  ALBUMIN 2.6* 2.5*  CBG: No results for input(s): GLUCAP in the last 168 hours.   No results found for this or any previous visit (from the past 240 hours).       Radiology Studies: No results found.       Scheduled Meds:  ALPRAZolam   0.5 mg Oral BID   amLODipine   10 mg Oral Daily   Chlorhexidine  Gluconate Cloth  6 each Topical Daily   citalopram   20 mg Oral Daily   enoxaparin  (LOVENOX ) injection  40 mg Subcutaneous Q24H   feeding supplement  237 mL Oral BID BM   lacosamide   200 mg Oral BID   levothyroxine   150 mcg Oral QAC breakfast   metoprolol  tartrate  50 mg Oral BID   nicotine   21 mg Transdermal Daily   PHENobarbital   64.8 mg Oral BID   potassium chloride   40 mEq Oral BID   sodium chloride  flush  10-40 mL Intracatheter Q12H   sodium chloride  flush  3 mL Intravenous Q12H   umeclidinium bromide   1 puff Inhalation Daily   Continuous Infusions:   LOS: 8 days    Time spent: over 30 min     Donnetta Gains, MD [facility]   To contact the attending provider between 7A-7P or the covering provider during after hours 7P-7A, please log into the web site [email or web address] and access using  universal [place] password for that web site. If you do not have the password, please call the hospital operator.  03/07/2024, 2:20 PM

## 2024-03-08 DIAGNOSIS — K922 Gastrointestinal hemorrhage, unspecified: Secondary | ICD-10-CM | POA: Diagnosis not present

## 2024-03-08 LAB — BASIC METABOLIC PANEL WITH GFR
Anion gap: 11 (ref 5–15)
BUN: 13 mg/dL (ref 8–23)
CO2: 24 mmol/L (ref 22–32)
Calcium: 9.3 mg/dL (ref 8.9–10.3)
Chloride: 100 mmol/L (ref 98–111)
Creatinine, Ser: 0.58 mg/dL — ABNORMAL LOW (ref 0.61–1.24)
GFR, Estimated: >60 mL/min (ref >60)
Glucose, Bld: 150 mg/dL — ABNORMAL HIGH (ref 70–99)
Potassium: 4.4 mmol/L (ref 3.5–5.1)
Sodium: 135 mmol/L (ref 135–145)

## 2024-03-08 MED ORDER — AMLODIPINE BESYLATE 10 MG PO TABS: 10.0000 mg | ORAL_TABLET | Freq: Every day | ORAL | Status: DC

## 2024-03-08 MED ORDER — METOPROLOL TARTRATE 25 MG PO TABS: 50.0000 mg | ORAL_TABLET | Freq: Two times a day (BID) | ORAL | Status: DC

## 2024-03-08 MED ORDER — ALPRAZOLAM 0.5 MG PO TABS: 0.5000 mg | ORAL_TABLET | Freq: Two times a day (BID) | ORAL | 0 refills | Status: DC

## 2024-03-08 MED ORDER — ENOXAPARIN SODIUM 40 MG/0.4ML IJ SOSY: 40.0000 mg | PREFILLED_SYRINGE | INTRAMUSCULAR | Status: DC

## 2024-03-08 MED ORDER — OXYCODONE HCL 5 MG PO TABS: 5.0000 mg | ORAL_TABLET | ORAL | 0 refills | Status: DC | PRN

## 2024-03-08 NOTE — TOC Transition Note (Signed)
 Transition of Care [facility]) - Discharge Note   Patient Details  Name: [name] MRN: [id number] Date of Birth: 1961-05-09  Transition of Care [facility]) CM/SW Contact:  Jeffory Mings, [place] Phone Number: 03/08/2024, 1:04 PM   Clinical Narrative: Pt for dc to [facility]. Spoke to Tammy in admissions who confirmed they are prepared to admit pt to room 101. [name] at bedside and agreeable to dc plan. RN provided with number for report and PTAR arranged for transport. SW signing off at dc.   Paullette Boston, MSW, LCSW [phone] (coverage)        Final next level of care: Skilled Nursing Facility Barriers to Discharge: Barriers Resolved   Patient Goals and CMS Choice     Choice offered to / list presented to : Spouse      Discharge Placement              Patient chooses bed at: Other - please specify in the comment section below: [facility]) Patient to be transferred to facility by: PTAR Name of family member notified: [name]/dtr Patient and family notified of of transfer: 03/08/24  Discharge Plan and Services Additional resources added to the After Visit Summary for     Discharge Planning Services: CM Consult Post Acute Care Choice: Home Health          DME Arranged: Wheelchair manual, Bedside commode DME Agency: [employer] Date DME Agency Contacted: 03/06/24 Time DME Agency Contacted: 1154 Representative spoke with at DME Agency: [employer] HH Arranged: PT, OT, Nurse's Aide, RN, Social Work [employer] Agency: [facility] Date HH Agency Contacted: 03/06/24 Time HH Agency Contacted: 1155 Representative spoke with at [facility] Agency: Imelda Man  Social Drivers of Health (SDOH) Interventions SDOH Screenings   Food Insecurity: Patient Unable To Answer (02/29/2024)  Housing: Patient Unable To Answer (02/29/2024)  Transportation Needs: Patient Unable To Answer (02/29/2024)  Utilities: Patient Unable To Answer (02/29/2024)  Financial Resource Strain: Low Risk   (12/31/2022)   Received from [facility]  Physical Activity: Unknown (03/27/2023)   Received from [facility]  Social Connections: Patient Unable To Answer (02/29/2024)  Stress: Stress Concern Present (03/27/2023)   Received from [facility]  Tobacco Use: High Risk (03/07/2024)     Readmission Risk Interventions     No data to display

## 2024-03-08 NOTE — Discharge Summary (Signed)
 Physician Discharge Summary  Emrik Erhard XBJ:[id number] DOB: June 09, 1961 DOA: 02/28/2024  PCP: Claudell Cruz, MD  Admit date: 02/28/2024 Discharge date: 03/08/2024  Time spent: 40 minutes  Recommendations for Outpatient Follow-up:  Follow outpatient CBC/CMP  Follow with GI outpatient for ischemic colitis - needs outpatient colonoscopy Follow with nsgy  Discharged on prophylactic lovenox , plan for 3 months given his catheter related DVT and his recent subdural  Follow blood pressure outpatient Follow chronic HA outpatient, additional w/u as indicated   Discharge Diagnoses:  Principal Problem:   Acute lower GI bleeding Active Problems:   Colitis   Subdural hematoma (HCC)   Deep vein thrombosis (DVT) of right upper extremity (HCC)   Seizure disorder (HCC)   Essential hypertension, benign   Hypothyroidism   COPD (chronic obstructive pulmonary disease) (HCC)   GIB (gastrointestinal bleeding)   Ischemic colitis (HCC)   Rectal bleeding   Abdominal pain   Discharge Condition: stable  Diet recommendation: heart healthy, diabetic  Filed Weights   02/28/24 1445  Weight: 83 kg    History of present illness:   63 year old with Savio Albrecht history of COPD with tobacco abuse, chronic hypoxia requiring 3 L nasal cannula with activity, HTN, hypothyroidism, depression/anxiety, right upper extremity DVT on low-dose Lovenox , and Aretta Stetzel recent traumatic right subdural hematoma complicated by seizure activity requiring right sided bur holes for evacuation 02/18/2024 (D/C to SNF 02/24/24) who was transported to the ER from his SNF with rectal bleeding. He was enjoying Dartha Rozzell meal at the SNF when he developed the acute onset of left lower quadrant abdominal pain followed by an episode of bright red blood per rectum. In the ER his blood pressure was stable.  CT of the abdomen/pelvis noted circumferential wall thickening of the descending colon and sigmoid colon compatible with infectious or inflammatory colitis.  He's  been seen by GI, suspects ischemic colitis.  Recommending outpatient colonoscopy.    He's gradually improved.  See below for additional details.   Hospital Course:  Assessment and Plan:  BRBPR Likely due to combination of Lovenox  and acute ischemic colitis  hemoglobin stable at this time, resolved resumed prophylactic dose Lovenox  6/8   Acute colitis of descending and sigmoid colon - likely ischemic in nature 6/6 CT angio abd/pelvis with circumferential wall thickening involving the descending colon and sigmoid colon  Seen by GI, suspected ischemic colitis - recommending colonoscopy outpatient with primary GI MD Stool studies ordered, but appear to have been canceled, likely due to lack of diarrhea?  Abx were discontinued with resolution of diarrhea Repeat CT 6/11 with findings c/w colitis (some improvement in proximal and mid descending colon  He's improving    Traumatic subdural hematoma requiring right sided bur hole for evacuation 02/18/2024 Intractable Headache  Follow-up CT head notes stable 8 mm low-density right cerebral SDH - operative drainage performed by Dr. [name] 02/18/2024  ? If nsgy saw him 6/10 (sounds like they did, staples removed - discussed with Dr. [name]) - will reach out to discuss his chronic headaches (may have headaches up to 6 weeks or so), opiates not great option long term with risk of rebound - would limit use of opiates for HA's   Right upper extremity DVT related to PICC line PICC line was removed when the DVT was noted during recent hospital stay  He'd been on prophylactic dose anticoagulation after discussion with NSGY US  without DVT in upper extremity 6/9 (5/23 had DVT in R axillary and brachial veins) Discussed anticoagulation options.  Given recent  subdural, risk of full dose anticoagulation currently outweight benefits.  Based on my discussion with nsgy, will continue prophylactic lovenox  x3 months, wife is agreeable to this.      Seizure  disorder Due to above - continue Vimpat  and phenobarbital  -no clinical evidence of seizure activity during this admission   COPD with chronic hypoxic respiratory failure Requires 3 L O2 with activity at baseline (currently on room air) - stable at present - continue usual meds   HTN Started on amlodipine , metop increased   Severe hypokalemia Follow outpatient   Hypomagnesemia Follow outpatient   Hypothyroidism Continue usual Synthroid  dose   Depression/anxiety Continue usual Celexa  and Xanax  doses     Procedures: US  Summary:    Right:  No evidence of deep vein thrombosis in the upper extremity. No evidence of  superficial vein thrombosis in the upper extremity.    Left:  No evidence of thrombosis in the subclavian    Consultations: GI nsgy  Discharge Exam: Vitals:   03/08/24 0416 03/08/24 0700  BP: 121/63 (!) 127/59  Pulse: 60 68  Resp: 18 18  Temp: 98.1 F (36.7 C) 97.6 F (36.4 C)  SpO2: 100% 98%   C/o continued HA Says abdominal pain getting bed.  He notes HA is also gradually improving. Wife at bedside, they're happy to be leaving  General: No acute distress. Cardiovascular: RRR Lungs: unlabored Abdomen: Soft, nontender, nondistended Neurological: Alert and oriented 3. Moves all extremities 4 with equal strength. Cranial nerves II through XII grossly intact. Extremities: No clubbing or cyanosis. No edema.   Discharge Instructions   Discharge Instructions     Call MD for:  difficulty breathing, headache or visual disturbances   Complete by: As directed    Call MD for:  extreme fatigue   Complete by: As directed    Call MD for:  hives   Complete by: As directed    Call MD for:  persistant dizziness or light-headedness   Complete by: As directed    Call MD for:  persistant nausea and vomiting   Complete by: As directed    Call MD for:  redness, tenderness, or signs of infection (pain, swelling, redness, odor or green/yellow discharge  around incision site)   Complete by: As directed    Call MD for:  severe uncontrolled pain   Complete by: As directed    Call MD for:  temperature >100.4   Complete by: As directed    Diet - low sodium heart healthy   Complete by: As directed    Discharge instructions   Complete by: As directed    You were seen for colitis.  We suspect this was ischemic colitis.  Your symptoms should gradually improve with time.    You should follow up with gastroenterology as an outpatient.  You'll need Chrstopher Malenfant colonoscopy as an outpatient.    Follow with Dr. [name] from neurosurgery outpatient.  He said your headaches may last up to 6 weeks or so.  I would expect gradual improvement so if you notice sudden worsening or headaches that aren't improving, please follow up with neurosurgery sooner (or seek emergent care).  Given your DVT (blood clot) you've been started on lovenox .  The dose you're on is not treatment dose, but lower dose (prophylactic dose to help prevent blood clots).  We're giving you Isaiha Asare dose that isn't quite typical for your situation due to your brain bleed.  We'll plan to continue this dose for 3 months.  You should  follow with your outpatient doctors and Dr. [name] for additional recommendations for your blood thinner.   Return for new, recurrent or worsening symptoms.  Please ask your PCP to request records from this hospitalization so they know what was done and what the next steps will be.   Increase activity slowly   Complete by: As directed    No wound care   Complete by: As directed       Allergies as of 03/08/2024       Reactions   Levetiracetam  Other (See Comments)   Severe anxiety, agitation, hyperactivity, very negative disposition   Tegretol [carbamazepine] Anxiety, Other (See Comments)   Increased seizures/ Induces seizures   Penicillins Other (See Comments)   Reaction not noted   Topiramate Other (See Comments)   Reaction not noted        Medication List      STOP taking these medications    traMADol  50 MG tablet Commonly known as: ULTRAM        TAKE these medications    acetaminophen  500 MG tablet Commonly known as: TYLENOL  Take 1 tablet (500 mg total) by mouth 3 (three) times daily.   albuterol  108 (90 Base) MCG/ACT inhaler Commonly known as: VENTOLIN  HFA Inhale 2 puffs into the lungs every 4 (four) hours as needed for wheezing or shortness of breath.   ALPRAZolam  0.5 MG tablet Commonly known as: XANAX  Take 1 tablet (0.5 mg total) by mouth 2 (two) times daily.   amLODipine  10 MG tablet Commonly known as: NORVASC  Take 1 tablet (10 mg total) by mouth daily. Start taking on: March 09, 2024   Calcium  High Potency/Vitamin D 600-5 MG-MCG Tabs Generic drug: Calcium  Carb-Cholecalciferol Take 1 tablet by mouth daily.   citalopram  20 MG tablet Commonly known as: CELEXA  Take 1 tablet (20 mg total) by mouth daily.   enoxaparin  40 MG/0.4ML injection Commonly known as: LOVENOX  Inject 0.4 mLs (40 mg total) into the skin daily. Until 05/16/2024.  Follow with outpatient providers for additional recommendations/decisions regarding anticoagulation. What changed: additional instructions   guaiFENesin  100 MG/5ML liquid Commonly known as: ROBITUSSIN Take 15 mLs by mouth every 6 (six) hours.   lacosamide  200 MG Tabs tablet Commonly known as: VIMPAT  Take 1 tablet (200 mg total) by mouth 2 (two) times daily.   levothyroxine  150 MCG tablet Commonly known as: SYNTHROID  Take 150 mcg by mouth daily before breakfast.   metFORMIN 500 MG 24 hr tablet Commonly known as: GLUCOPHAGE-XR Take 500 mg by mouth in the morning and at bedtime.   metoprolol  tartrate 25 MG tablet Commonly known as: LOPRESSOR  Take 2 tablets (50 mg total) by mouth 2 (two) times daily. What changed: how much to take   multivitamin with minerals Tabs tablet Take 1 tablet by mouth daily.   nicotine  21 mg/24hr patch Commonly known as: NICODERM CQ  - dosed in mg/24  hours Place 1 patch (21 mg total) onto the skin daily.   omeprazole 20 MG capsule Commonly known as: PRILOSEC Take 20 mg by mouth daily.   oxyCODONE  5 MG immediate release tablet Commonly known as: Oxy IR/ROXICODONE  Take 1-2 tablets (5-10 mg total) by mouth every 4 (four) hours as needed for severe pain (pain score 7-10).   pantoprazole  40 MG tablet Commonly known as: PROTONIX  Take 1 tablet (40 mg total) by mouth daily.   PHENobarbital  64.8 MG tablet Commonly known as: LUMINAL Take 1 tablet (64.8 mg total) by mouth 2 (two) times daily.   polyethylene glycol 17 g  packet Commonly known as: MIRALAX  / GLYCOLAX  Take 17 g by mouth daily.   Spiriva  Respimat 2.5 MCG/ACT Aers Generic drug: Tiotropium Bromide  Monohydrate Inhale 2 puffs into the lungs daily.   VITAMIN C PO Take 1 tablet by mouth daily.               Durable Medical Equipment  (From admission, onward)           Start     Ordered   03/06/24 1150  For home use only DME lightweight manual wheelchair with seat cushion  Once       Comments: Patient suffers from seizures, global weakness  which impairs their ability to perform daily activities like toileting in the home.  Zanyah Lentsch walker will not resolve  issue with performing activities of daily living. Laporshia Hogen wheelchair will allow patient to safely perform daily activities. Patient is not able to propel themselves in the home using Gwendlyn Hanback standard weight wheelchair due to general weakness. Patient can self propel in the lightweight wheelchair. Length of need Lifetime. Accessories: elevating leg rests (ELRs), wheel locks, extensions and anti-tippers.   03/06/24 1150   03/06/24 1148  For home use only DME Bedside commode  Once       Comments: The patient is confined to one room and  needs Deante Blough bedside commode  Question:  Patient needs Freeda Spivey bedside commode to treat with the following condition  Answer:  Weakness   03/06/24 1150           Allergies  Allergen Reactions    Levetiracetam  Other (See Comments)    Severe anxiety, agitation, hyperactivity, very negative disposition   Tegretol [Carbamazepine] Anxiety and Other (See Comments)    Increased seizures/ Induces seizures   Penicillins Other (See Comments)    Reaction not noted   Topiramate Other (See Comments)    Reaction not noted    Contact information for follow-up providers     Claudell Cruz, MD Follow up.   Specialty: Family Medicine Why: follow with your outpatient PCP Contact information: [address] [place] [address] [phone]         Van Gelinas, MD Follow up.   Specialty: Neurosurgery Why: follow up with neurosurgery Contact information: [address] [place] [address] [phone]              Contact information for after-discharge care     Destination     [facility] .   Service: Skilled Nursing Contact information: [address] [place]  [address] [phone]                      The results of significant diagnostics from this hospitalization (including imaging, microbiology, ancillary and laboratory) are listed below for reference.    Significant Diagnostic Studies: CT ABDOMEN PELVIS W CONTRAST Result Date: 03/04/2024 CLINICAL DATA:  Abdominal pain EXAM: CT ABDOMEN AND PELVIS WITH CONTRAST TECHNIQUE: Multidetector CT imaging of the abdomen and pelvis was performed using the standard protocol following bolus administration of intravenous contrast. RADIATION DOSE REDUCTION: This exam was performed according to the departmental dose-optimization program which includes automated exposure control, adjustment of the mA and/or kV according to patient size and/or use of iterative reconstruction technique. CONTRAST:  75mL OMNIPAQUE  IOHEXOL  350 MG/ML SOLN COMPARISON:  02/28/2024 FINDINGS: Lower chest: No acute findings Hepatobiliary: No focal hepatic abnormality. Gallbladder  unremarkable. Pancreas: No focal abnormality or ductal dilatation. Spleen: No focal abnormality.  Normal size. Adrenals/Urinary Tract: No adrenal abnormality. No focal renal abnormality. No stones or hydronephrosis. Urinary bladder is unremarkable. Stomach/Bowel: There is circumferential wall thickening involving the distal descending colon and much of the sigmoid colon compatible with colitis. This has Sheyenne Konz similar appearance to prior study except that less of the descending colon is involved. No bowel obstruction. Stomach and small bowel unremarkable. Vascular/Lymphatic: Aortic atherosclerosis. No evidence of aneurysm or adenopathy. Reproductive: No visible focal abnormality. Other: No free fluid or free air. Musculoskeletal: No acute bony abnormality. Again noted is soft tissue stranding overlying the left hip laterally, similar prior study which may be related to prior trauma. IMPRESSION: Continued wall thickening involving the distal descending colon and sigmoid colon compatible with colitis. Some improvement noted in the proximal and mid descending colon. Otherwise unchanged. No surrounding fluid collection to suggest abscess. Aortic atherosclerosis. Continued stranding over the left lateral hip in the overlying soft tissues, possibly related to recent trauma. Electronically Signed   By: Janeece Mechanic M.D.   On: 03/04/2024 17:19   VAS US  UPPER EXTREMITY VENOUS DUPLEX Result Date: 03/02/2024 UPPER VENOUS STUDY  Patient Name:  ANIS DEGIDIO  Date of Exam:   03/02/2024 Medical Rec #: [id number]      Accession #:    [id number] Date of Birth: 08-11-1961      Patient Gender: M Patient Age:   38 years Exam Location:  [facility] Procedure:      VAS US  UPPER EXTREMITY VENOUS DUPLEX Referring Phys: JEFFREY MCCLUNG --------------------------------------------------------------------------------  Indications: DVT Risk Factors: DVT. Limitations: Poor ultrasound/tissue interface. Comparison Study: 02/14/2024 - Right:                    No evidence of superficial vein thrombosis in the upper                   extremity.                   Findings                   consistent with acute deep vein thrombosis involving the right                   axillary                   vein and                   right brachial veins.                    Left:                   Findings consistent with acute superficial vein thrombosis                   involving the                   left                   cephalic vein. Performing Technologist: Lerry Ransom RVT  Examination Guidelines: Laurie Penado complete evaluation includes B-mode imaging, spectral Doppler, color Doppler, and power Doppler as needed of all accessible portions of each vessel. Bilateral testing is considered an integral part of Jaisean Monteforte complete examination. Limited examinations for reoccurring indications may be performed as noted.  Right Findings: +----------+------------+---------+-----------+----------+-------+ RIGHT     CompressiblePhasicitySpontaneousPropertiesSummary +----------+------------+---------+-----------+----------+-------+ IJV  Full       Yes       Yes                      +----------+------------+---------+-----------+----------+-------+ Subclavian               Yes       Yes                      +----------+------------+---------+-----------+----------+-------+ Axillary      Full       Yes       Yes                      +----------+------------+---------+-----------+----------+-------+ Brachial      Full                                          +----------+------------+---------+-----------+----------+-------+ Radial        Full                                          +----------+------------+---------+-----------+----------+-------+ Ulnar         Full                                          +----------+------------+---------+-----------+----------+-------+ Cephalic      Full                                           +----------+------------+---------+-----------+----------+-------+ Basilic       Full                                          +----------+------------+---------+-----------+----------+-------+  Left Findings: +----------+------------+---------+-----------+----------+-------+ LEFT      CompressiblePhasicitySpontaneousPropertiesSummary +----------+------------+---------+-----------+----------+-------+ Subclavian               Yes       Yes                      +----------+------------+---------+-----------+----------+-------+  Summary:  Right: No evidence of deep vein thrombosis in the upper extremity. No evidence of superficial vein thrombosis in the upper extremity.  Left: No evidence of thrombosis in the subclavian.  *See table(s) above for measurements and observations.  Diagnosing physician: Irvin Mantel Electronically signed by Irvin Mantel on 03/02/2024 at 4:54:23 PM.    Final    CT HEAD WO CONTRAST ( ) Result Date: 02/28/2024 CLINICAL DATA:  Headache, no red flags s/p craniotomy with headache EXAM: CT HEAD WITHOUT CONTRAST TECHNIQUE: Contiguous axial images were obtained from the base of the skull through the vertex without intravenous contrast. RADIATION DOSE REDUCTION: This exam was performed according to the departmental dose-optimization program which includes automated exposure control, adjustment of the mA and/or kV according to patient size and/or use of iterative reconstruction technique. COMPARISON:  02/24/2024 FINDINGS: Brain: Postoperative changes on the right. Low-density subdural hematoma again noted measuring 8 mm in thickness, stable. No midline shift. No new areas  of hemorrhage. No hydrocephalus or acute infarction. Previously seen blood layering in the left occipital horn of the lateral ventricle no longer visualized. Vascular: No hyperdense vessel or unexpected calcification. Skull: No acute calvarial abnormality. Postoperative changes in the right calvarium.  Sinuses/Orbits: No acute findings Other: None IMPRESSION: 8 mm low-density subdural hematoma over the right cerebral hemisphere is similar to prior study. No new areas of hemorrhage. Electronically Signed   By: Janeece Mechanic M.D.   On: 02/28/2024 20:00   CT Angio Abd/Pel W and/or Wo Contrast Result Date: 02/28/2024 CLINICAL DATA:  Lower GI bleed EXAM: CTA ABDOMEN AND PELVIS WITHOUT AND WITH CONTRAST TECHNIQUE: Multidetector CT imaging of the abdomen and pelvis was performed using the standard protocol during bolus administration of intravenous contrast. Multiplanar reconstructed images and MIPs were obtained and reviewed to evaluate the vascular anatomy. RADIATION DOSE REDUCTION: This exam was performed according to the departmental dose-optimization program which includes automated exposure control, adjustment of the mA and/or kV according to patient size and/or use of iterative reconstruction technique. CONTRAST:  70mL OMNIPAQUE  IOHEXOL  350 MG/ML SOLN COMPARISON:  09/06/2023 FINDINGS: VASCULAR Aorta: Irregular calcified and noncalcified plaque throughout the aorta. No aneurysm or dissection. Celiac: Patent without evidence of aneurysm, dissection, vasculitis or significant stenosis. SMA: Patent without evidence of aneurysm, dissection, vasculitis or significant stenosis. Renals: Both renal arteries are patent without evidence of aneurysm, dissection, vasculitis, fibromuscular dysplasia or significant stenosis. IMA: Patent without evidence of aneurysm, dissection, vasculitis or significant stenosis. Inflow: Calcified plaque.  No aneurysm or dissection. Proximal Outflow: Calcifications.  No aneurysm or dissection. Veins: No obvious venous abnormality within the limitations of this arterial phase study. Review of the MIP images confirms the above findings. NON-VASCULAR Lower chest: No acute findings Hepatobiliary: No focal hepatic abnormality. Gallbladder unremarkable. Pancreas: No focal abnormality or ductal  dilatation. Spleen: No focal abnormality.  Normal size. Adrenals/Urinary Tract: No adrenal abnormality. No focal renal abnormality. No stones or hydronephrosis. Urinary bladder is unremarkable. Stomach/Bowel: There is wall thickening and surrounding inflammation involving the descending colon and sigmoid colon compatible with infectious or inflammatory colitis. No active extravasation of contrast. Stomach and small bowel decompressed, unremarkable. Lymphatic: No adenopathy Reproductive: No visible focal abnormality. Other: No free fluid or free air. Musculoskeletal: No acute bony abnormality. The stranding noted in the lateral left hip soft tissues, possibly hematoma. Recommend clinical correlation for recent trauma. IMPRESSION: VASCULAR Irregular calcified and noncalcified plaque throughout the aorta. No aneurysm or dissection. No active extravasation of contrast. NON-VASCULAR Circumferential wall thickening involving the descending colon and sigmoid colon compatible with infectious or inflammatory colitis. Stranding in the subcutaneous soft tissues in the left lateral hip region, possibly hematoma. Recommend clinical correlation for recent trauma/injury in the area. Electronically Signed   By: Janeece Mechanic M.D.   On: 02/28/2024 19:57   CT HEAD WO CONTRAST ( ) Result Date: 02/24/2024 CLINICAL DATA:  63 year old male status post fall with subdural hematoma, postoperative day 6 status post operative subdural evacuation. EXAM: CT HEAD WITHOUT CONTRAST TECHNIQUE: Contiguous axial images were obtained from the base of the skull through the vertex without intravenous contrast. RADIATION DOSE REDUCTION: This exam was performed according to the departmental dose-optimization program which includes automated exposure control, adjustment of the mA and/or kV according to patient size and/or use of iterative reconstruction technique. COMPARISON:  02/21/2024 head CTs and earlier. FINDINGS: Brain: Decreased postoperative  pneumocephalus, trace residual. Mixed density left side subdural hematoma with Lamark Schue mildly lobulated component lateral to the right occipital pole is  now generally 6 mm in thickness along the right lateral convexity, 1 mm or so smaller since the initial postoperative head CT. Intracranial mass effect and leftward midline shift have also regressed since that time. Residual leftward midline shift is 3-4 mm, which also appears less than on 02/21/2024. Small volume hemorrhage layering in the left occipital horn is stable. Stable ventricle size and configuration. Basilar cisterns are patent. No new intracranial hemorrhage identified. Stable gray-white matter differentiation throughout the brain. No cortically based acute infarct identified. Vascular: No suspicious intracranial vascular hyperdensity. Calcified atherosclerosis at the skull base. Skull: Stable right convexity burr holes. Sinuses/Orbits: Visualized paranasal sinuses and mastoids are stable and well aerated. Other: Postoperative changes to the right scalp. Orbits appear negative. IMPRESSION: 1. Mild additional regression of mixed density Right Subdural Hematoma since postoperative day 1. Residual SDH now 6 mm at most levels. 2. Associated decreased intracranial mass effect. Residual leftward midline shift now 3-4 mm. 3. Stable small volume IVH. 4. No new intracranial abnormality. Electronically Signed   By: Marlise Simpers M.D.   On: 02/24/2024 10:09   CT HEAD WO CONTRAST ( ) Result Date: 02/21/2024 CLINICAL DATA:  Provided history: Subdural hematoma. EXAM: CT HEAD WITHOUT CONTRAST TECHNIQUE: Contiguous axial images were obtained from the base of the skull through the vertex without intravenous contrast. RADIATION DOSE REDUCTION: This exam was performed according to the departmental dose-optimization program which includes automated exposure control, adjustment of the mA and/or kV according to patient size and/or use of iterative reconstruction technique.  COMPARISON:  Prior head CT examinations 02/19/2024 and earlier. FINDINGS: Brain: Tadeo Besecker residual mixed density subdural hematoma along the right cerebral convexity has not significantly changed in size, again measuring up to 10 mm in thickness (remeasured on prior). Unchanged mass effect with 5 mm leftward midline shift. Persistent although decreased pneumocephalus overlying the right cerebral hemisphere. Small-volume hemorrhage layering within the left lateral ventricle occipital horn has undergone some interval redistribution, but has not changed in extent. Unchanged size and configuration of the ventricular system. No interval acute intracranial hemorrhage. No acute demarcated cortical infarct. No evidence of an intracranial mass. Vascular: No hyperdense vessel.  Atherosclerotic calcifications. Skull: Two right parietal burr holes. Sinuses/Orbits: No orbital mass or acute orbital finding. Mild mucosal thickening scattered within the paranasal sinuses at the imaged levels. Other: Scalp staples on the right. IMPRESSION: 1. Unchanged size of Fidencio Duddy residual mixed density subdural hematoma along the right cerebral convexity. Unchanged mass effect with 5 mm leftward midline shift. 2. Persistent although decreased post-operative pneumocephalus along the right cerebral convexity. 3. Small-volume hemorrhage within the left lateral ventricle has undergone some redistribution, but has not changed in extent. 4. No new acute intracranial abnormality. Electronically Signed   By: Bascom Lily D.O.   On: 02/21/2024 13:46   CT HEAD WO CONTRAST Result Date: 02/19/2024 CLINICAL DATA:  63 year old male with altered mental status, fall, subdural hematoma which had progressed yesterday. Postoperative day 1 status post subdural drainage/evacuation. EXAM: CT HEAD WITHOUT CONTRAST TECHNIQUE: Contiguous axial images were obtained from the base of the skull through the vertex without intravenous contrast. RADIATION DOSE REDUCTION: This exam was  performed according to the departmental dose-optimization program which includes automated exposure control, adjustment of the mA and/or kV according to patient size and/or use of iterative reconstruction technique. COMPARISON:  Head CT yesterday. FINDINGS: Brain: Interval right side burr hole placement and subdural drain in place. Mixed density right side subdural hematoma has decreased to 7-8 mm thickness now at most levels  on coronal images. Small volume postoperative pneumocephalus. Intracranial mass effect has significantly regressed, leftward midline shift has decreased to 5 mm (previously 10 mm) and right lateral ventricle patency has improved. Left lateral ventricle size stable to slightly smaller. Small volume of left occipital horn hemorrhage is stable. Basilar cisterns appear stable to improved. No new intracranial hemorrhage. Maintained gray-white differentiation. No cortically based acute infarct identified. Vascular: Calcified atherosclerosis at the skull base. No suspicious intracranial vascular hyperdensity. Skull: 2 new right superior convexity burr holes.  Otherwise intact. Sinuses/Orbits: Visualized paranasal sinuses and mastoids are stable and well aerated. Other: New postoperative changes to the right scalp with skin staples in place. Percutaneous drain in place. Disconjugate gaze. IMPRESSION: 1. Postoperative regression of mixed density right side SDH, residual now 7-8 mm at most levels. 2. Decreased intracranial mass effect, residual leftward midline shift is 5 mm. Stable small volume IVH. 3. No new intracranial abnormality. Electronically Signed   By: Marlise Simpers M.D.   On: 02/19/2024 04:58   CT HEAD WO CONTRAST ( ) Addendum Date: 02/18/2024 ADDENDUM REPORT: 02/18/2024 06:39 ADDENDUM: Study discussed by telephone with Dr. Sundil on 02/18/2024 at 0605 hours. Electronically Signed   By: Marlise Simpers M.D.   On: 02/18/2024 06:39   Result Date: 02/18/2024 CLINICAL DATA:  63 year old male with history  of altered mental status, fall, subdural hematomas. EXAM: CT HEAD WITHOUT CONTRAST TECHNIQUE: Contiguous axial images were obtained from the base of the skull through the vertex without intravenous contrast. RADIATION DOSE REDUCTION: This exam was performed according to the departmental dose-optimization program which includes automated exposure control, adjustment of the mA and/or kV according to patient size and/or use of iterative reconstruction technique. COMPARISON:  Head CT 02/04/2024 and earlier. FINDINGS: Brain: Increased and now mixed density right side subdural hematoma since 02/04/2024, up to 14 mm thickness now on coronal images (coronal image 39) versus 11 mm at approximately the same level 02/04/2024. Associated increased intracranial mass effect and leftward midline shift. Midline shift now 9-10 mm (previously 4-5 mm. Increased mass effect on the right lateral ventricle. Mildly trapped appearance of the left lateral ventricle which has not significantly changed along with Lizzie Cokley small volume of layering intraventricular hemorrhage (series 2, image 21). Basilar cistern patency not significantly changed, maintained. Stable gray-white matter differentiation throughout the brain. No other intracranial hemorrhage identified. Vascular: No suspicious intracranial vascular hyperdensity. Skull: Appears stable and intact. Sinuses/Orbits: Visualized paranasal sinuses and mastoids are stable and well aerated. Other: No acute orbit or scalp soft tissue finding identified. IMPRESSION: 1. Progressed and now mixed density Right Subdural Hematoma: 14 mm thickness (11 mm on 02/04/2024) with increased intracranial mass effect and leftward midline shift now 9-10 mm (previously 4-5 mm). 2. Increased mass effect on the right lateral ventricle. Mildly trapped appearance of the left lateral ventricle stable along with small volume layering intraventricular hemorrhage. 3. No skull fracture identified. Electronically Signed: By: Marlise Simpers M.D. On: 02/18/2024 05:55   VAS US  UPPER EXTREMITY VENOUS DUPLEX Result Date: 02/15/2024 UPPER VENOUS STUDY  Patient Name:  TRI CHITTICK  Date of Exam:   02/14/2024 Medical Rec #: [id number]      Accession #:    [id number] Date of Birth: [birth year]      Patient Gender: M Patient Age:   25 years Exam Location:  [facility] Procedure:      VAS US  UPPER EXTREMITY VENOUS DUPLEX Referring Phys: TRUNG VU --------------------------------------------------------------------------------  Indications: FUO Risk Factors: Right PICC. Limitations: Poor ultrasound/tissue interface  and patient positioning, patient immobility, patient guarding left arm. Comparison Study: No prior studies. Performing Technologist: Lerry Ransom RVT  Examination Guidelines: [place] complete evaluation includes B-mode imaging, spectral Doppler, color Doppler, and power Doppler as needed of all accessible portions of each vessel. Bilateral testing is considered an integral part of Permelia Bamba complete examination. Limited examinations for reoccurring indications may be performed as noted.  Right Findings: +----------+------------+---------+-----------+----------+-------+ RIGHT     CompressiblePhasicitySpontaneousPropertiesSummary +----------+------------+---------+-----------+----------+-------+ IJV           Full       Yes       Yes                      +----------+------------+---------+-----------+----------+-------+ Subclavian               Yes       Yes                      +----------+------------+---------+-----------+----------+-------+ Axillary      None       No        No                Acute  +----------+------------+---------+-----------+----------+-------+ Brachial    Partial      Yes       Yes               Acute  +----------+------------+---------+-----------+----------+-------+ Radial        Full                                           +----------+------------+---------+-----------+----------+-------+ Ulnar         Full                                          +----------+------------+---------+-----------+----------+-------+ Cephalic      Full                                          +----------+------------+---------+-----------+----------+-------+ Basilic       Full                                          +----------+------------+---------+-----------+----------+-------+  Left Findings: +----------+------------+---------+-----------+----------+--------------+ LEFT      CompressiblePhasicitySpontaneousProperties   Summary     +----------+------------+---------+-----------+----------+--------------+ IJV           Full       Yes       Yes                             +----------+------------+---------+-----------+----------+--------------+ Subclavian               Yes       Yes                             +----------+------------+---------+-----------+----------+--------------+ Axillary      Full       Yes       Yes                             +----------+------------+---------+-----------+----------+--------------+  Brachial                                            Not visualized +----------+------------+---------+-----------+----------+--------------+ Radial                                              Not visualized +----------+------------+---------+-----------+----------+--------------+ Ulnar                                               Not visualized +----------+------------+---------+-----------+----------+--------------+ Cephalic      None                                      Acute      +----------+------------+---------+-----------+----------+--------------+ Basilic       Full                                                 +----------+------------+---------+-----------+----------+--------------+ Thrombus located in the cephalic vein is noted to extend  from the proximal upper arm into the distal upper arm. Unable to visualize cephalic vein in the [facility] fossa to determine involvement.  Summary:  Right: No evidence of superficial vein thrombosis in the upper extremity. Findings consistent with acute deep vein thrombosis involving the right axillary vein and right brachial veins.  Left: Findings consistent with acute superficial vein thrombosis involving the left cephalic vein.  *See table(s) above for measurements and observations.  Diagnosing physician: Irvin Mantel Electronically signed by Irvin Mantel on 02/15/2024 at 10:14:30 AM.    Final    VAS US  LOWER EXTREMITY VENOUS (DVT) Result Date: 02/15/2024  Lower Venous DVT Study Patient Name:  LADARRION TELFAIR  Date of Exam:   02/14/2024 Medical Rec #: [id number]      Accession #:    [id number] Date of Birth: 18-Dec-1960      Patient Gender: M Patient Age:   63 years Exam Location:  [facility] Procedure:      VAS US  LOWER EXTREMITY VENOUS (DVT) Referring Phys: Kimberly Penna --------------------------------------------------------------------------------  Indications: Swelling, and fever.  Comparison Study: No priors. Performing Technologist: Franky Ivanoff Sturdivant-Jones RDMS, RVT  Examination Guidelines: Leyland Kenna complete evaluation includes B-mode imaging, spectral Doppler, color Doppler, and power Doppler as needed of all accessible portions of each vessel. Bilateral testing is considered an integral part of Ellouise Mcwhirter complete examination. Limited examinations for reoccurring indications may be performed as noted. The reflux portion of the exam is performed with the patient in reverse Trendelenburg.  +---------+---------------+---------+-----------+----------+--------------+ RIGHT    CompressibilityPhasicitySpontaneityPropertiesThrombus Aging +---------+---------------+---------+-----------+----------+--------------+ CFV      Full           Yes      Yes                                  +---------+---------------+---------+-----------+----------+--------------+ SFJ      Full                                                        +---------+---------------+---------+-----------+----------+--------------+  FV Prox  Full                                                        +---------+---------------+---------+-----------+----------+--------------+ FV Mid   Full                                                        +---------+---------------+---------+-----------+----------+--------------+ FV DistalFull                                                        +---------+---------------+---------+-----------+----------+--------------+ PFV      Full                                                        +---------+---------------+---------+-----------+----------+--------------+ POP      Full           Yes      Yes                                 +---------+---------------+---------+-----------+----------+--------------+ PTV      Full                                                        +---------+---------------+---------+-----------+----------+--------------+ PERO     Full                                                        +---------+---------------+---------+-----------+----------+--------------+   +---------+---------------+---------+-----------+----------+--------------+ LEFT     CompressibilityPhasicitySpontaneityPropertiesThrombus Aging +---------+---------------+---------+-----------+----------+--------------+ CFV      Full           Yes      Yes                                 +---------+---------------+---------+-----------+----------+--------------+ SFJ      Full                                                        +---------+---------------+---------+-----------+----------+--------------+ FV Prox  Full                                                         +---------+---------------+---------+-----------+----------+--------------+  FV Mid   Full                                                        +---------+---------------+---------+-----------+----------+--------------+ FV DistalFull                                                        +---------+---------------+---------+-----------+----------+--------------+ PFV      Full                                                        +---------+---------------+---------+-----------+----------+--------------+ POP      Full           Yes      Yes                                 +---------+---------------+---------+-----------+----------+--------------+ PTV      Full                                                        +---------+---------------+---------+-----------+----------+--------------+ PERO     Full                                                        +---------+---------------+---------+-----------+----------+--------------+     Summary: BILATERAL: - No evidence of deep vein thrombosis seen in the lower extremities, bilaterally. -No evidence of popliteal cyst, bilaterally.   *See table(s) above for measurements and observations. Electronically signed by Irvin Mantel on 02/15/2024 at 10:13:08 AM.    Final    CT CHEST WO CONTRAST Result Date: 02/13/2024 CLINICAL DATA:  Respiratory illness, nondiagnostic x-ray. Fever and cough, possible pneumonia. EXAM: CT CHEST WITHOUT CONTRAST TECHNIQUE: Multidetector CT imaging of the chest was performed following the standard protocol without IV contrast. RADIATION DOSE REDUCTION: This exam was performed according to the departmental dose-optimization program which includes automated exposure control, adjustment of the mA and/or kV according to patient size and/or use of iterative reconstruction technique. COMPARISON:  02/12/2024, 09/06/2023. FINDINGS: Cardiovascular: The heart is normal in size of there is Makeya Hilgert trace pericardial  effusion. Coronary artery calcifications are noted. The distal tip of Halana Deisher right PICC line terminates in the right atrium. There is atherosclerotic calcification of the aorta without evidence of aneurysm. The pulmonary trunk is normal in caliber. Mediastinum/Nodes: No mediastinal or axillary lymphadenopathy is seen. Evaluation of the hila is limited due to lack of IV contrast. The thyroid  gland, trachea, and esophagus are within normal limits. Lungs/Pleura: Paraseptal and centrilobular emphysematous changes are noted in the lungs. Atelectasis is noted bilaterally. No effusion or pneumothorax is seen. No definite pulmonary nodule  or mass is seen. Upper Abdomen: Dontrel Smethers few scattered subcentimeter hypodensities are present in the liver which are too small to further characterize. No acute abnormality. Musculoskeletal: There is stable bony deformity in the proximal left humerus. Degenerative changes are present in the thoracic spine. No acute osseous abnormality is seen. IMPRESSION: 1. No acute intrathoracic process. 2. Emphysema. 3. Coronary artery calcification. 4. Aortic atherosclerosis. Electronically Signed   By: Wyvonnia Heimlich M.D.   On: 02/13/2024 19:50   DG Chest Port 1V same Day Result Date: 02/12/2024 CLINICAL DATA:  Fever. EXAM: PORTABLE CHEST 1 VIEW COMPARISON:  Feb 09, 2024. FINDINGS: The heart size and mediastinal contours are within normal limits. Stable right-sided PICC line. Both lungs are clear. The visualized skeletal structures are unremarkable. IMPRESSION: No active disease. Electronically Signed   By: Rosalene Colon M.D.   On: 02/12/2024 14:55   DG Swallowing Func-Speech Pathology Result Date: 02/10/2024 Table formatting from the original result was not included. Modified Barium Swallow Study Patient Details Name: Sequan Auxier MRN: [id number] Date of Birth: Dec 17, 1960 Today's Date: 02/10/2024 HPI/PMH: HPI: Pt is Lisvet Rasheed 63 y.o. male who was brought to ED by EMS from home after Kyleigha Markert fall. CT head (01/30/23)  revealed Slightly increased size of right larger than left subdural hematomas. Small volume subarachnoid hemorrhage with mildly increased hemorrhage in the left lateral ventricle. Failed yale 02/01/24. PMH: Seizure disorder, COPD; ST f/u for po readiness in acute setting; pt currently has TF for nutritive/hydration purposes. Clinical Impression: Clinical Impression: Pt presents with functional airway protection but oral dysphagia, which is suspected to be secondary to cognitive factors. His positioning was suboptimal as his shoulders were drawn up, prohibiting Kensi Karr complete view of the trachea. Trace penetration was observed with thin liquids but was expelled as complete laryngeal elevation occurred (PAS 2, considered WFL). Of note, there was frequent coughing and throat clearance throughout the study in the absence of airway invasion. Ultimately, there was no significant residue but pt had increased difficulty with initiating mastication when presented solids. He initially declined to try solids so question effort. Attempted to present the 13 mm barium tablet with thin liquids but pt expectorated it before swallowing and it was not reattempted. Suspect his level of alertness may fluctuate given administration of antiepileptics which have been observed to cause lethargy but feel he could start Monicia Tse diet if given full supervision. Since observation of mastication was limited, recommend starting with Dys 2 solids and thin liquids. Recommend pills whole with puree or via Cortrak. SLP will continue following but do not anticipate post-acute needs for swallowing. Factors that may increase risk of adverse event in presence of aspiration Roderick Civatte & Jessy Morocco 2021): Factors that may increase risk of adverse event in presence of aspiration Roderick Civatte & Jessy Morocco 2021): Reduced cognitive function; Limited mobility; Dependence for feeding and/or oral hygiene; Presence of tubes (ETT, trach, NG, etc.) Recommendations/Plan: Swallowing  Evaluation Recommendations Swallowing Evaluation Recommendations Recommendations: PO diet PO Diet Recommendation: Dysphagia 2 (Finely chopped); Thin liquids (Level 0) Liquid Administration via: Cup; Straw Medication Administration: Whole meds with puree Supervision: Full supervision/cueing for swallowing strategies; Staff to assist with self-feeding Swallowing strategies  : Minimize environmental distractions; Slow rate Postural changes: Position pt fully upright for meals; Stay upright 30-60 min after meals Oral care recommendations: Oral care BID (2x/day) Treatment Plan Treatment Plan Treatment recommendations: Therapy as outlined in treatment plan below Follow-up recommendations: Skilled nursing-short term rehab (<3 hours/day) Functional status assessment: Patient has had Nickoles Gregori recent decline in their functional  status and demonstrates the ability to make significant improvements in function in Camari Quintanilla reasonable and predictable amount of time. Treatment frequency: Min 2x/week Treatment duration: 1 week Interventions: Compensatory techniques; Patient/family education; Trials of upgraded texture/liquids Recommendations Recommendations for follow up therapy are one component of Makiya Jeune multi-disciplinary discharge planning process, led by the attending physician.  Recommendations may be updated based on patient status, additional functional criteria and insurance authorization. Assessment: Orofacial Exam: Orofacial Exam Oral Cavity: Oral Hygiene: WFL Oral Cavity - Dentition: Edentulous; Dentures, not available Orofacial Anatomy: WFL Oral Motor/Sensory Function: WFL Anatomy: Anatomy: Suspected cervical osteophytes Boluses Administered: Boluses Administered Boluses Administered: Thin liquids (Level 0); Mildly thick liquids (Level 2, nectar thick); Moderately thick liquids (Level 3, honey thick); Puree; Solid  Oral Impairment Domain: Oral Impairment Domain Lip Closure: Interlabial escape, no progression to anterior lip Tongue control  during bolus hold: Not tested Bolus preparation/mastication: Timely and efficient chewing and mashing Bolus transport/lingual motion: Brisk tongue motion Oral residue: Complete oral clearance Location of oral residue : N/Minerva Bluett Initiation of pharyngeal swallow : Valleculae  Pharyngeal Impairment Domain: Pharyngeal Impairment Domain Soft palate elevation: No bolus between soft palate (SP)/pharyngeal wall (PW) Laryngeal elevation: Complete superior movement of thyroid  cartilage with complete approximation of arytenoids to epiglottic petiole Anterior hyoid excursion: Complete anterior movement Epiglottic movement: Complete inversion Laryngeal vestibule closure: Complete, no air/contrast in laryngeal vestibule Pharyngeal stripping wave : Present - complete Pharyngeal contraction (Trevin Gartrell/P view only): N/Aleja Yearwood Pharyngoesophageal segment opening: Complete distension and complete duration, no obstruction of flow Tongue base retraction: No contrast between tongue base and posterior pharyngeal wall (PPW) Pharyngeal residue: Complete pharyngeal clearance Location of pharyngeal residue: N/Lorma Heater  Esophageal Impairment Domain: No data recorded Pill: Pill Consistency administered: Thin liquids (Level 0) Thin liquids (Level 0): Impaired (see clinical impressions) Penetration/Aspiration Scale Score: Penetration/Aspiration Scale Score 1.  Material does not enter airway: Mildly thick liquids (Level 2, nectar thick); Moderately thick liquids (Level 3, honey thick); Puree; Solid 2.  Material enters airway, remains ABOVE vocal cords then ejected out: Thin liquids (Level 0) Compensatory Strategies: Compensatory Strategies Compensatory strategies: No   General Information: Caregiver present: No  Diet Prior to this Study: NPO; Cortrak/Small bore NG tube   Temperature : Normal   Respiratory Status: WFL   Supplemental O2: None (Room air)   History of Recent Intubation: No  Behavior/Cognition: Alert; Cooperative; Requires cueing Self-Feeding Abilities:  Dependent for feeding Baseline vocal quality/speech: Normal Volitional Cough: Able to elicit Volitional Swallow: Able to elicit Exam Limitations: Poor participation; Poor positioning Goal Planning: Prognosis for improved oropharyngeal function: Fair Barriers to Reach Goals: Cognitive deficits No data recorded Patient/Family Stated Goal: to have PO intake and Cortrak removed Consulted and agree with results and recommendations: Patient Pain: Pain Assessment Pain Assessment: Faces Pain Score: 10 Faces Pain Scale: 0 Pain Location: all over Pain Descriptors / Indicators: Aching Pain Intervention(s): Monitored during session End of Session: Start Time:SLP Start Time (ACUTE ONLY): 1345 Stop Time: SLP Stop Time (ACUTE ONLY): 1406 Time Calculation:SLP Time Calculation (min) (ACUTE ONLY): 21 min Charges: SLP Evaluations $ SLP Speech Visit: 1 Visit SLP Evaluations $MBS Swallow: 1 Procedure $Swallowing Treatment: 1 Procedure SLP visit diagnosis: SLP Visit Diagnosis: Dysphagia, oropharyngeal phase (R13.12) Past Medical History: Past Medical History: Diagnosis Date  Anemia   Depression   Diabetes mellitus without complication (HCC)   Emphysema of lung (HCC)   GERD (gastroesophageal reflux disease)   Hiatal hernia   Hyperlipidemia   Hypertension   Memory loss   Seizures (HCC)  Thyroid  disease  Past Surgical History: Past Surgical History: Procedure Laterality Date  APPENDECTOMY    DENTAL SURGERY    EYE SURGERY    TONSILLECTOMY AND ADENOIDECTOMY   Amil Kale, M.Ricketta Colantonio., CCC-SLP Speech Language Pathology, Acute Rehabilitation Services Secure Chat preferred [phone] 02/10/2024, 3:17 PM  DG Chest Port 1 View Result Date: 02/09/2024 CLINICAL DATA:  Fever. EXAM: PORTABLE CHEST 1 VIEW COMPARISON:  Feb 04, 2024. FINDINGS: Stable cardiomediastinal silhouette. Feeding tube is seen entering stomach. No acute pulmonary disease. Right-sided PICC line is noted with tip in expected position of cavoatrial junction. Bony thorax is  unremarkable. IMPRESSION: No active disease. Electronically Signed   By: Rosalene Colon M.D.   On: 02/09/2024 14:27    Microbiology: No results found for this or any previous visit (from the past 240 hours).   Labs: Basic Metabolic Panel: Recent Labs  Lab 03/02/24 0427 03/03/24 0349 03/04/24 0344 03/05/24 0346 03/06/24 0055  NA 136 136 138 136 135  K 3.1* 3.3* 3.8 4.4 4.0  CL 106 106 106 104 100  CO2 22 24 27 26 25   GLUCOSE 122* 114* 119* 115* 129*  BUN 6* 5* 10 8 15   CREATININE 0.62 0.53* 0.63 0.54* 0.78  CALCIUM  8.2* 7.9* 8.4* 8.4* 8.8*  MG 1.7 1.6* 1.6* 1.7 1.5*  PHOS  --   --   --  3.1 4.2   Liver Function Tests: Recent Labs  Lab 03/05/24 0346 03/06/24 0055  AST 34 50*  ALT 40 59*  ALKPHOS 94 105  BILITOT 0.5 0.4  PROT 5.9* 5.9*  ALBUMIN 2.6* 2.5*   No results for input(s): LIPASE, AMYLASE in the last 168 hours. No results for input(s): AMMONIA in the last 168 hours. CBC: Recent Labs  Lab 03/02/24 0427 03/03/24 0349 03/04/24 0344 03/05/24 0346 03/06/24 0055  WBC 10.9* 9.0 10.0 9.4 9.8  NEUTROABS  --   --   --  5.0 4.7  HGB 9.2* 9.2* 9.2* 9.2* 9.3*  HCT 28.7* 28.3* 28.9* 29.2* 29.0*  MCV 103.2* 102.9* 103.2* 102.5* 102.8*  PLT 292 306 324 335 368   Cardiac Enzymes: No results for input(s): CKTOTAL, CKMB, CKMBINDEX, TROPONINI in the last 168 hours. BNP: BNP (last 3 results) Recent Labs    02/03/24 0445 02/09/24 1322 02/10/24 0253  BNP 177.1* 20.9 46.0    ProBNP (last 3 results) No results for input(s): PROBNP in the last 8760 hours.  CBG: No results for input(s): GLUCAP in the last 168 hours.     Signed:  Donnetta Gains MD.  [facility] 03/08/2024, 10:55 AM

## 2024-03-08 NOTE — Progress Notes (Signed)
 Attempted to call report 3 times to [facility]. Keep getting transferred to a voicemail.

## 2024-03-08 NOTE — TOC Progression Note (Signed)
 Transition of Care [facility]) - Progression Note    Patient Details  Name: [name] MRN: [id number] Date of Birth: 05/20/1961  Transition of Care [facility]) CM/SW Contact  Paullette Boston [place], [place] Phone Number: 03/08/2024, 8:48 AM  Clinical Narrative:  Per Tammy with [facility], they have received auth and can admit pt today if medically stable. MD updated.   Paullette Boston, MSW, LCSW [phone] (coverage)       Expected Discharge Plan: Home w Home Health Services Barriers to Discharge: No Barriers Identified  Expected Discharge Plan and Services   Discharge Planning Services: CM Consult Post Acute Care Choice: Home Health                   DME Arranged: Wheelchair manual, Bedside commode DME Agency: [employer] Date DME Agency Contacted: 03/06/24 Time DME Agency Contacted: 1154 Representative spoke with at DME Agency: [employer] HH Arranged: PT, OT, Nurse's Aide, RN, Social Work [employer] Agency: [facility] Date HH Agency Contacted: 03/06/24 Time HH Agency Contacted: 1155 Representative spoke with at [facility] Agency: Imelda Man   Social Determinants of Health (SDOH) Interventions SDOH Screenings   Food Insecurity: Patient Unable To Answer (02/29/2024)  Housing: Patient Unable To Answer (02/29/2024)  Transportation Needs: Patient Unable To Answer (02/29/2024)  Utilities: Patient Unable To Answer (02/29/2024)  Financial Resource Strain: Low Risk  (12/31/2022)   Received from [facility]  Physical Activity: Unknown (03/27/2023)   Received from [facility]  Social Connections: Patient Unable To Answer (02/29/2024)  Stress: Stress Concern Present (03/27/2023)   Received from [facility]  Tobacco Use: High Risk (03/07/2024)    Readmission Risk Interventions     No data to display

## 2024-03-08 NOTE — Progress Notes (Addendum)
 DISCHARGE NOTE [facility] to be discharged Skilled nursing facility [facility] per MD order. Patient verbalized understanding.  Skin clean, dry and intact without evidence of skin break down, no evidence of skin tears noted. IV catheter discontinued intact. Site without signs and symptoms of complications. Dressing and pressure applied. Pt denies pain at the site currently. No complaints noted.  See LDA for wound at discharge Patient free of lines, drains, and wounds.   Discharge packet assembled. An After Visit Summary (AVS) was printed and given to the EMS personnel. Patient escorted via stretcher and discharged to [facility] via ambulance. Report called to accepting facility; all questions and concerns addressed.   Tonda Francisco, RN

## 2024-03-08 NOTE — Plan of Care (Signed)
  Problem: Education: Goal: Knowledge of General Education information will improve Description: Including pain rating scale, medication(s)/side effects and non-pharmacologic comfort measures Outcome: Adequate for Discharge   Problem: Health Behavior/Discharge Planning: Goal: Ability to manage health-related needs will improve Outcome: Adequate for Discharge   Problem: Clinical Measurements: Goal: Ability to maintain clinical measurements within normal limits will improve Outcome: Adequate for Discharge Goal: Will remain free from infection Outcome: Adequate for Discharge Goal: Diagnostic test results will improve Outcome: Adequate for Discharge Goal: Respiratory complications will improve Outcome: Adequate for Discharge Goal: Cardiovascular complication will be avoided Outcome: Adequate for Discharge   Problem: Activity: Goal: Risk for activity intolerance will decrease Outcome: Adequate for Discharge   Problem: Nutrition: Goal: Adequate nutrition will be maintained Outcome: Adequate for Discharge   Problem: Coping: Goal: Level of anxiety will decrease Outcome: Adequate for Discharge   Problem: Elimination: Goal: Will not experience complications related to bowel motility Outcome: Adequate for Discharge Goal: Will not experience complications related to urinary retention Outcome: Adequate for Discharge   Problem: Pain Managment: Goal: General experience of comfort will improve and/or be controlled Outcome: Adequate for Discharge   Problem: Safety: Goal: Ability to remain free from injury will improve Outcome: Adequate for Discharge   Problem: Skin Integrity: Goal: Risk for impaired skin integrity will decrease Outcome: Adequate for Discharge   Problem: Acute Rehab PT Goals(only PT should resolve) Goal: Pt Will Go Supine/Side To Sit Outcome: Adequate for Discharge Goal: Pt Will Go Sit To Supine/Side Outcome: Adequate for Discharge Goal: Patient Will Transfer  Sit To/From Stand Outcome: Adequate for Discharge Goal: Pt Will Perform Standing Balance Or Pre-Gait Outcome: Adequate for Discharge Goal: Pt Will Ambulate Outcome: Adequate for Discharge Goal: Pt/caregiver will Perform Home Exercise Program Outcome: Adequate for Discharge   Problem: Acute Rehab OT Goals (only OT should resolve) Goal: Pt. Will Perform Grooming Outcome: Adequate for Discharge Goal: Pt. Will Perform Upper Body Bathing Outcome: Adequate for Discharge Goal: Pt. Will Perform Lower Body Bathing Outcome: Adequate for Discharge Goal: Pt. Will Transfer To Toilet Outcome: Adequate for Discharge Goal: Pt. Will Perform Toileting-Clothing Manipulation Outcome: Adequate for Discharge Goal: OT Additional ADL Goal #1 Outcome: Adequate for Discharge

## 2024-03-11 ENCOUNTER — Encounter: Payer: Self-pay | Admitting: Physician Assistant

## 2024-03-17 ENCOUNTER — Emergency Department (HOSPITAL_COMMUNITY): Payer: Medicare (Managed Care)

## 2024-03-17 ENCOUNTER — Emergency Department (HOSPITAL_COMMUNITY)
Admission: EM | Admit: 2024-03-17 | Discharge: 2024-03-17 | Disposition: A | Discharge status: Skilled Nursing Facility | Payer: Medicare (Managed Care) | Attending: Emergency Medicine | Admitting: Emergency Medicine

## 2024-03-17 DIAGNOSIS — I10 Essential (primary) hypertension: Secondary | ICD-10-CM | POA: Diagnosis not present

## 2024-03-17 DIAGNOSIS — W19XXXA Unspecified fall, initial encounter: Secondary | ICD-10-CM

## 2024-03-17 DIAGNOSIS — M542 Cervicalgia: Secondary | ICD-10-CM | POA: Diagnosis not present

## 2024-03-17 DIAGNOSIS — W06XXXA Fall from bed, initial encounter: Secondary | ICD-10-CM | POA: Diagnosis not present

## 2024-03-17 DIAGNOSIS — E119 Type 2 diabetes mellitus without complications: Secondary | ICD-10-CM | POA: Insufficient documentation

## 2024-03-17 DIAGNOSIS — S0990XA Unspecified injury of head, initial encounter: Secondary | ICD-10-CM | POA: Diagnosis present

## 2024-03-17 DIAGNOSIS — F17210 Nicotine dependence, cigarettes, uncomplicated: Secondary | ICD-10-CM | POA: Diagnosis not present

## 2024-03-17 NOTE — ED Provider Notes (Signed)
 [facility]EM[facility]Y DEPT [facility] Emergency Department Provider Note MRN:  [id number]  Arrival date & time: 03/17/24     Chief Complaint   Fall History of Present Illness   [name] is a 63 y.o. year-old male with a history of hypertension, diabetes presenting to the ED with chief complaint of fall.  Patient reports falling when trying to get out of bed.  [place] and hit his head.  Endorsing head pain, neck pain.  Denies chest pain or shortness of breath, no abdominal pain, no back pain, no injuries to the arms or legs.  Takes Lovenox .  Review of Systems  A thorough review of systems was obtained and all systems are negative except as noted in the HPI and PMH.   Patient's Health History    Past Medical History:  Diagnosis Date   Anemia    Depression    Diabetes mellitus without complication (HCC)    Emphysema of lung (HCC)    GERD (gastroesophageal reflux disease)    Hiatal hernia    Hyperlipidemia    Hypertension    Memory loss    Seizures (HCC)    Thyroid  disease     Past Surgical History:  Procedure Laterality Date   APPENDECTOMY     CRANIOTOMY Right 02/18/2024   Procedure: CRANIOTOMY HEMATOMA EVACUATION SUBDURAL;  Surgeon: Debby Dorn MATSU, MD;  Location: [facility] OR;  Service: Neurosurgery;  Laterality: Right;  RIGHT BURR HOLES ,   DENTAL SURGERY     EYE SURGERY     TONSILLECTOMY AND ADENOIDECTOMY      Family History  Problem Relation Age of Onset   Stroke Mother    Heart disease Father    Thyroid  disease Father    Thyroid  disease Brother     Social History   Socioeconomic History   Marital status: Married    Spouse name: suzanne    Number of children: 2   Years of education: GED   Highest education level: Not on file  Occupational History    Comment: Disabled  Tobacco Use   Smoking status: Every Day    Types: E-cigarettes, Cigarettes   Smokeless tobacco: Never   Tobacco comments:    11/01/2015 did smoke 3 ppd; he's been vaping for 4 years; no  nicotine  for the past year; smoked a cigarette 2 days ago  Substance and Sexual Activity   Alcohol use: No    Alcohol/week: 0.0 standard drinks of alcohol   Drug use: No   Sexual activity: Not on file  Other Topics Concern   Not on file  Social History Narrative   Patient lives at home with his wife Albino).    Disabled.   Education GED.   Left handed.   Caffeine sweet tea daily and diet coke.   Social Drivers of Corporate investment banker Strain: Low Risk  (12/31/2022)   Received from [facility]   Overall Financial Resource Strain (CARDIA)    Difficulty of Paying Living Expenses: Not hard at all  Food Insecurity: Patient Unable To Answer (02/29/2024)   Hunger Vital Sign    Worried About Running Out of Food in the Last Year: Patient unable to answer    Ran Out of Food in the Last Year: Patient unable to answer  Transportation Needs: Patient Unable To Answer (02/29/2024)   PRAPARE - Transportation    Lack of Transportation (Medical): Patient unable to answer    Lack of Transportation (Non-Medical): Patient unable to answer  Physical Activity: Unknown (03/27/2023)  [facility]   Exercise Vital Sign    On average, how many days per week do you engage in moderate to strenuous exercise (like a brisk walk)?: 0 days    Minutes of Exercise per Session: Not on file  Stress: Stress Concern Present (03/27/2023)   Received from [facility] - Occupational Stress Questionnaire    Feeling of Stress : To some extent  Social Connections: Patient Unable To Answer (02/29/2024)   Social Connection and Isolation Panel    Frequency of Communication with Friends and Family: Patient unable to answer    Frequency of Social Gatherings with Friends and Family: Patient unable to answer    Attends Religious Services: Patient unable to answer    Active Member of Clubs or Organizations: Patient unable to answer    Attends Banker  Meetings: Patient unable to answer    Marital Status: Patient unable to answer  Intimate Partner Violence: Patient Unable To Answer (02/29/2024)   Humiliation, Afraid, Rape, and Kick questionnaire    Fear of Current or Ex-Partner: Patient unable to answer    Emotionally Abused: Patient unable to answer    Physically Abused: Patient unable to answer    Sexually Abused: Patient unable to answer     Physical Exam   Vitals:   03/17/24 0253 03/17/24 0300  BP: 120/60 129/64  Pulse:  64  Resp:  14  Temp: 98.7 F (37.1 C)   SpO2:  100%    CONSTITUTIONAL: Chronically ill-appearing, NAD, pale NEURO/PSYCH:  Alert and oriented x 3, no focal deficits EYES:  eyes equal and reactive ENT/NECK:  no LAD, no JVD CARDIO: Regular rate, well-perfused, normal S1 and S2 PULM:  CTAB no wheezing or rhonchi GI/GU:  non-distended, non-tender MSK/SPINE:  No gross deformities, no edema SKIN:  no rash, atraumatic   *Additional and/or pertinent findings included in MDM below  Diagnostic and Interventional Summary    EKG Interpretation Date/Time:  Tuesday March 17 2024 02:53:49 EDT Ventricular Rate:  61 PR Interval:  204 QRS Duration:  95 QT Interval:  410 QTC Calculation: 413 R Axis:   35  Text Interpretation: Sinus rhythm Confirmed by Theadore Sharper [phone]) on 03/17/2024 3:40:42 AM       Labs Reviewed - No data to display  CT HEAD WO CONTRAST ( )  Final Result    CT CERVICAL SPINE WO CONTRAST  Final Result    DG Chest Port 1 View  Final Result      Medications - No data to display   Procedures  /  Critical Care Procedures  ED Course and Medical Decision Making  Initial Impression and Ddx Recently discharged after GI bleeding here with mechanical fall.  Reassuring vital signs.  Does take Lovenox  per report.  Level 2 trauma activation given the head trauma.  Awaiting CT imaging.  Past medical/surgical history that increases complexity of ED encounter: Diabetes, DVT  Interpretation  of Diagnostics I personally reviewed the Chest Xray and my interpretation is as follows: No pneumothorax or lobar opacity  CT head and CT cervical spine are without significant acute traumatic injury  Patient Reassessment and Ultimate Disposition/Management     Patient resting comfortably with normal vital signs, no real indication for further diagnostics given the ground-level mechanical fall.  Appropriate for discharge.  Patient management required discussion with the following services or consulting groups:  None  Complexity of Problems Addressed Acute illness or injury that poses threat  of life of bodily function  Additional Data Reviewed and Analyzed Further history obtained from: Prior labs/imaging results  Additional Factors Impacting ED Encounter Risk Consideration of hospitalization  Ozell HERO. Theadore, MD [facility] Emergency Medicine [facility] [email or web address]  Final Clinical Impressions(s) / ED Diagnoses     ICD-10-CM   1. Fall, initial encounter  W19.XXXA     2. Injury of head, initial encounter  S09.90XA     3. Neck pain  M54.2       ED Discharge Orders     None        Discharge Instructions Discussed with and Provided to Patient:     Discharge Instructions      You were evaluated in the Emergency Department and after careful evaluation, we did not find any emergent condition requiring admission or further testing in the hospital.  Your exam/testing today was overall reassuring.  No significant injuries from your fall.  Recommend Tylenol  at home as needed for soreness  Please return to the Emergency Department if you experience any worsening of your condition.  Thank you for allowing us  to be a part of your care.        Theadore Ozell HERO, MD 03/17/24 [phone]

## 2024-03-17 NOTE — ED Triage Notes (Signed)
 Pt BIB [facility] from [facility]. Per facility staff pt rolled out of bed, hitting his head. Small hematoma noted to head. Pt dos take lovenox  daily.

## 2024-03-17 NOTE — Progress Notes (Signed)
   03/17/24 0305  Spiritual Encounters  Type of Visit Initial  Care provided to: Pt not available  Referral source Trauma page  Reason for visit Trauma  OnCall Visit No   Chaplain responded to level two trauma. The patient was attended to by the medical team.  No family is present. If a chaplain is requested someone will respond.   Carley [facility]  [phone]

## 2024-03-17 NOTE — Discharge Instructions (Addendum)
 You were evaluated in the Emergency Department and after careful evaluation, we did not find any emergent condition requiring admission or further testing in the hospital.  Your exam/testing today was overall reassuring.  No significant injuries from your fall.  Recommend Tylenol  at home as needed for soreness  Please return to the Emergency Department if you experience any worsening of your condition.  Thank you for allowing us  to be a part of your care.

## 2024-03-31 ENCOUNTER — Telehealth: Payer: Self-pay | Admitting: Neurology

## 2024-03-31 NOTE — Telephone Encounter (Signed)
 Patient Requesting to see Dr. Gregg for seizure management, received new referral from [facility]. Last seen Dr. Chalice 2016 for same. Had also seen [facility] Dr. Georjean in 2016 for same. Please advise if this is acceptable, thank you

## 2024-03-31 NOTE — Telephone Encounter (Signed)
 I can see him if he does not want to go back to [facility].

## 2024-04-24 ENCOUNTER — Telehealth: Payer: Self-pay | Admitting: Neurology

## 2024-04-24 ENCOUNTER — Encounter: Payer: Self-pay | Admitting: Neurology

## 2024-04-24 ENCOUNTER — Institutional Professional Consult (permissible substitution): Payer: Medicare (Managed Care) | Admitting: Neurology

## 2024-04-24 NOTE — Telephone Encounter (Signed)
 Pt wife called , they were on their way to appt and got lost . Pt wife states that Pt really need  medication and need a sooner appt . Pt wife was unaware that  she would not know how to get to  MD office .  Wife is requesting earlier appt

## 2024-04-24 NOTE — Telephone Encounter (Signed)
 Dr. Gregg- You currently have no openings next week. Is there a day/time you would like to double book before I call?

## 2024-04-24 NOTE — Telephone Encounter (Signed)
 Please inquire which medication he needs refill on and we will add him to the schedule next week. Thanks

## 2024-04-27 NOTE — Telephone Encounter (Signed)
 Call to patient, no answer. Left message offering 8/5 appt with dr. Gregg. Advised to call back asap

## 2024-04-27 NOTE — Telephone Encounter (Signed)
 Pt wife called back to reschedule appt   Appt Scheduled

## 2024-04-27 NOTE — Telephone Encounter (Signed)
 Can you please check with referral and see if you can use tomorrow slot that they blocked for urgent referral. Thanks.

## 2024-04-28 ENCOUNTER — Ambulatory Visit: Payer: Medicare (Managed Care) | Admitting: Neurology

## 2024-04-28 ENCOUNTER — Encounter: Payer: Self-pay | Admitting: Neurology

## 2024-04-28 VITALS — BP 212/102 | HR 75 | Resp 15 | Ht 67.0 in

## 2024-04-28 DIAGNOSIS — Z5181 Encounter for therapeutic drug level monitoring: Secondary | ICD-10-CM | POA: Diagnosis not present

## 2024-04-28 DIAGNOSIS — G40009 Localization-related (focal) (partial) idiopathic epilepsy and epileptic syndromes with seizures of localized onset, not intractable, without status epilepticus: Secondary | ICD-10-CM

## 2024-04-28 DIAGNOSIS — S065XAA Traumatic subdural hemorrhage with loss of consciousness status unknown, initial encounter: Secondary | ICD-10-CM | POA: Diagnosis not present

## 2024-04-28 MED ORDER — PHENOBARBITAL 64.8 MG PO TABS: 64.8000 mg | ORAL_TABLET | Freq: Two times a day (BID) | ORAL | 3 refills | Status: DC

## 2024-04-28 MED ORDER — LACOSAMIDE 200 MG PO TABS: 200.0000 mg | ORAL_TABLET | Freq: Two times a day (BID) | ORAL | 3 refills | Status: DC

## 2024-04-28 NOTE — Progress Notes (Signed)
 [facility]  PATIENT: [name] DOB: 11/27/1960  REQUESTING CLINICIAN: Rena Luke POUR, MD HISTORY FROM: Patient/spouse  REASON FOR VISIT: Breakthrough seizures    HISTORICAL  CHIEF COMPLAINT:  Chief Complaint  Patient presents with   New Patient (Initial Visit)    Room # 13, wife present, Former [facility]meier pt who requested to switch to [facility]. Also a referral from [facility]/Sara Johnanna NP [phone] for seizure management. Pt stated that his last sz occurred in late June in the nursing home he had silent, focal sz. Denied missing medication.     HISTORY OF PRESENT ILLNESS:   [name] is a 63 year old male with a history of seizure disorder, hypertension, hypothyroidism, diabetes, anxiety  who presents with recent seizure exacerbation after being admitted for fall and slipped sustaining subdural hematoma.  He is accompanied by his wife who provided most of the history.   He has a history of seizure disorder diagnosed in 2005 after experiencing his first grand mal seizure. Initially, his seizures were managed with Depakote , which controlled the grand mal seizures until May 2025. In May 2025, he experienced a severe grand mal seizure at home, leading to a fall and subsequent head injury resulting in a brain bleed. He was hospitalized for 48 hours of continuous seizures that were unresponsive to initial treatments. During hospitalization, phenobarbital  was administered, successfully stopping the seizures. He underwent surgery for hematomas which involved boreholes to relieve pressure. Post-surgery, he was switched from Depakote  to Vimpat  (lacosamide ) and phenobarbital  for seizure control. Since the medication change, he has not experienced any grand mal seizures, although he has had episodes of staring into space, particularly when stressed in the nursing home environment.  Wife reports confusion and memory issues, such as difficulty recalling dates  and spelling words backward. 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  Hospital Summary  63 year old who presents by EMS from home after a fall.  On admission patient was lethargic unable to provide history.  He was found to be bradycardic heart rate 40s to 80s, hypothermic, multiple bruises, of multiple ages left hip, left thigh, left shoulder.  UDS positive for benzos.  CT showed 9 mm right subdural hematoma with 3 mm midline shift and a small volume acute SAH, cervical spine was negative.  CCM admitted patient.  Patient was found to have subdural hematoma and seizure.  His care was subsequently transferred to [facility] on 5/12.  Complicated course by fever of unknown origin ultimately found to have right upper extremity DVT probably due to PICC line, fever resolved after PICC line was removed.  He was not a candidate for anticoagulation due to moderate-sized SDH.  Neurosurgery recommendation repeated CT head was done on 5/27 which showed further worsening of SDH subsequently reevaluated by neurosurgery and underwent burr hole and placement of subdural drain for evacuation of subdural hematoma by Dr. Debby on 5/27.  Postprocedure care was resumed by ICU team.   Patient care transferred again to [facility] on 5/30. He remain stable, still requiring pain management for headaches. Stable for rehab. Repeated CT head today stable.   Traumatic SDH/SAH: Seizure.  -Patient presented lethargic, CT head showed 9 mm right subdural hematoma.  -Fail to improved with conservative measure.  -Underwent burr hole and placement of subdural drain for evacuation of subdural hematoma by Dr. Debby on 5/27. -still having significant headaches, post procedure per family.  And not better. Neurosurgery order CT head.  -pain management, needs rehab.  Continue Phenobarbital  and Vimpat .  EVD removed  5/29. Repeated CT head 5/30; reviewed by neurosurgery stable. Ok to start low dose Lovenox  for prophylasix.  Continue to have headaches, discussed with neurology could try IV reglan . Pain likely post Increase oxycodone  to 5-10 mg PRN , tylenol , tramadol .  Stable for discharge    Handedness: left handed   Onset: 2005, worsening of seizure after recent fall and subdural hematoma  Seizure Type: Generalized convulsion   Current frequency: Last seizure during admission on May 2025  Any injuries from seizures: Fall, Subdural hematoma, shoulder injury   Previous ASMs: Depakote ,   Currenty ASMs: Phenobarbital , Lacosamide    ASMs side effects: Denies   Brain Images: 9 mm subdural hemorrhage with midline shift   Previous EEGs: EEG captured seizures arising from right posterior quadrant during recent admission    OTHER MEDICAL CONDITIONS: Epilepsy, hypertension, hyperlipidemia, diabetes, Hypothyroidism, anxiety   REVIEW OF SYSTEMS: Full 14 system review of systems performed and negative with exception of: As noted in the HPI   ALLERGIES: Allergies  Allergen Reactions   Levetiracetam  Other (See Comments)    Severe anxiety, agitation, hyperactivity, very negative disposition   Tegretol [Carbamazepine] Anxiety and Other (See Comments)    Increased seizures/ Induces seizures   Penicillins Other (See Comments)    Reaction not noted   Topiramate Other (See Comments)    Reaction not noted    HOME MEDICATIONS: Outpatient Medications Prior to Visit  Medication Sig Dispense Refill   acetaminophen  (TYLENOL ) 500 MG tablet Take 1 tablet (500 mg total) by mouth 3 (three) times daily. 30 tablet 0   albuterol  (PROVENTIL  HFA;VENTOLIN  HFA) 108 (90 BASE) MCG/ACT inhaler Inhale 2 puffs into the lungs every 4 (four) hours as needed for wheezing or shortness of breath.     ALPRAZolam  (XANAX ) 0.5 MG tablet Take 1 tablet (0.5 mg total) by mouth 2 (two) times daily. 2 tablet 0   Ascorbic Acid (VITAMIN C  PO) Take 1 tablet by mouth daily.     Calcium  Carb-Cholecalciferol (CALCIUM  HIGH POTENCY/VITAMIN D) 600-5 MG-MCG TABS Take 1 tablet by mouth daily.     citalopram  (CELEXA ) 20 MG tablet Take 1 tablet (20 mg total) by mouth daily. 30 tablet 0   guaiFENesin  (ROBITUSSIN) 100 MG/5ML liquid Take 15 mLs by mouth every 6 (six) hours. 120 mL 0   levothyroxine  (SYNTHROID , LEVOTHROID) 150 MCG tablet Take 150 mcg by mouth daily before breakfast.      metFORMIN (GLUCOPHAGE-XR) 500 MG 24 hr tablet Take 500 mg by mouth in the morning and at bedtime.     metoprolol  tartrate (LOPRESSOR ) 25 MG tablet Take 2 tablets (50 mg total) by mouth 2 (two) times daily.     Multiple Vitamin (MULTIVITAMIN WITH MINERALS) TABS tablet Take 1 tablet by mouth daily. 30 tablet 0   nicotine  (NICODERM CQ  - DOSED IN MG/24 HOURS) 21 mg/24hr patch Place 1 patch (21 mg total) onto the skin daily. 28 patch 0   omeprazole (PRILOSEC) 20 MG capsule Take 20 mg by mouth daily.     polyethylene glycol (MIRALAX  / GLYCOLAX ) 17 g packet Take 17 g by mouth daily. 14 each 0   Tiotropium Bromide  Monohydrate (SPIRIVA  RESPIMAT) 2.5 MCG/ACT AERS Inhale 2 puffs into the lungs daily.     lacosamide  (VIMPAT ) 200 MG TABS tablet Take 1 tablet (200 mg total) by mouth 2 (two) times daily. 60 tablet 0   PHENobarbital  (  LUMINAL) 64.8 MG tablet Take 1 tablet (64.8 mg total) by mouth 2 (two) times daily. 60 tablet 0   amLODipine  (NORVASC ) 10 MG tablet Take 1 tablet (10 mg total) by mouth daily.     enoxaparin  (LOVENOX ) 40 MG/0.4ML injection Inject 0.4 mLs (40 mg total) into the skin daily. Until 05/16/2024.  Follow with outpatient providers for additional recommendations/decisions regarding anticoagulation.     omeprazole (PRILOSEC) 20 MG capsule Take 20 mg by mouth daily.     oxyCODONE  (OXY IR/ROXICODONE ) 5 MG immediate release tablet Take 1-2 tablets (5-10 mg total) by mouth every 4 (four) hours as needed for severe pain (pain score 7-10). 6 tablet 0   pantoprazole   (PROTONIX ) 40 MG tablet Take 1 tablet (40 mg total) by mouth daily. 30 tablet 0   No facility-administered medications prior to visit.    PAST MEDICAL HISTORY: Past Medical History:  Diagnosis Date   Anemia    Depression    Diabetes mellitus without complication (HCC)    Emphysema of lung (HCC)    GERD (gastroesophageal reflux disease)    Hiatal hernia    Hyperlipidemia    Hypertension    Memory loss    Seizures (HCC)    Thyroid  disease     PAST SURGICAL HISTORY: Past Surgical History:  Procedure Laterality Date   APPENDECTOMY     CRANIOTOMY Right 02/18/2024   Procedure: CRANIOTOMY HEMATOMA EVACUATION SUBDURAL;  Surgeon: Debby Dorn MATSU, MD;  [facility];  Service: Neurosurgery;  Laterality: Right;  RIGHT BURR HOLES ,   DENTAL SURGERY     EYE SURGERY     TONSILLECTOMY AND ADENOIDECTOMY      FAMILY HISTORY: Family History  Problem Relation Age of Onset   Stroke Mother    Heart disease Father    Thyroid  disease Father    Thyroid  disease Brother     SOCIAL HISTORY: Social History   Socioeconomic History   Marital status: Married    Spouse name: suzanne    Number of children: 2   Years of education: GED   Highest education level: Not on file  Occupational History    Comment: Disabled  Tobacco Use   Smoking status: Former    Types: E-cigarettes, Cigarettes   Smokeless tobacco: Never   Tobacco comments:    11/01/2015 did smoke 3 ppd; he's been vaping for 4 years; no nicotine  for the past year; smoked a cigarette 2 days ago  Substance and Sexual Activity   Alcohol use: No    Alcohol/week: 0.0 standard drinks of alcohol   Drug use: No   Sexual activity: Not on file  Other Topics Concern   Not on file  Social History Narrative   Patient lives at home with his wife Albino).    Disabled.   Education GED.   Left handed.   Caffeine sweet tea daily and diet coke.   Social Drivers of Corporate investment banker Strain: Low Risk  (03/30/2024)    Received from [facility]   Overall Financial Resource Strain (CARDIA)    Difficulty of Paying Living Expenses: Not hard at all  Food Insecurity: No Food Insecurity (03/30/2024)   Received from [facility]   Hunger Vital Sign    Within the past 12 months, you worried that your food would run out before you got the money to buy more.: Never true    Within the past 12 months, the food you bought just didn't last and you didn't have money  to get more.: Never true  Transportation Needs: No Transportation Needs (03/30/2024)   Received from [facility]   PRAPARE - Transportation    Lack of Transportation (Medical): No    Lack of Transportation (Non-Medical): No  Physical Activity: Unknown (03/27/2023)   Received from [facility]   Exercise Vital Sign    On average, how many days per week do you engage in moderate to strenuous exercise (like a brisk walk)?: 0 days    Minutes of Exercise per Session: Not on file  Stress: Stress Concern Present (03/27/2023)   Received from [facility] - Occupational Stress Questionnaire    Feeling of Stress : To some extent  Social Connections: Patient Unable To Answer (02/29/2024)   Social Connection and Isolation Panel    Frequency of Communication with Friends and Family: Patient unable to answer    Frequency of Social Gatherings with Friends and Family: Patient unable to answer    Attends Religious Services: Patient unable to answer    Active Member of Clubs or Organizations: Patient unable to answer    Attends Banker Meetings: Patient unable to answer    Marital Status: Patient unable to answer  Intimate Partner Violence: Patient Unable To Answer (02/29/2024)   Humiliation, Afraid, Rape, and Kick questionnaire    Fear of Current or Ex-Partner: Patient unable to answer    Emotionally Abused: Patient unable to answer    Physically Abused: Patient unable to answer    Sexually Abused: Patient unable to  answer    PHYSICAL EXAM  GENERAL EXAM/CONSTITUTIONAL: Vitals:  Vitals:   04/28/24 1025 04/28/24 1031  BP: (!) 203/97 (!) 212/102  Pulse: 75   Resp: 15   SpO2: 96%   Height: 5' 7 (1.702 m)    Body mass index is 28.66 kg/m. Wt Readings from Last 3 Encounters:  03/17/24 182 lb 15.7 oz (83 kg)  02/28/24 182 lb 15.7 oz (83 kg)  02/24/24 183 lb 10.3 oz (83.3 kg)   Patient is in no distress; well developed, nourished and groomed; neck is supple  MUSCULOSKELETAL: Gait, strength, tone, movements noted in Neurologic exam below  NEUROLOGIC: MENTAL STATUS:     03/07/2015    3:23 PM 11/15/2014    2:30 PM  MMSE - Mini Mental State Exam  Orientation to time 3  3   Orientation to Place 5  5   Registration 3  3   Attention/ Calculation 4  2   Recall 0  0   Language- name 2 objects 2  2   Language- repeat 1 0  Language- follow 3 step command 3  3   Language- read & follow direction 1  1   Write a sentence 1  0   Copy design 0  0   Total score 23  19      Data saved with a previous flowsheet row definition   awake, alert, oriented to person, difficulty with date Difficulty with concentration and attention  language fluent, comprehension intact, naming intact fund of knowledge appropriate  CRANIAL NERVE:  2nd, 3rd, 4th, 6th - Visual fields full to confrontation, extraocular muscles intact, no nystagmus 5th - facial sensation symmetric 7th - facial strength symmetric 8th - hearing intact 9th - palate elevates symmetrically, uvula midline 11th - shoulder shrug symmetric 12th - tongue protrusion midline  MOTOR:  normal bulk and tone, full strength in the BUE, BLE  SENSORY:  normal and symmetric to  light touch  COORDINATION:  finger-nose-finger, fine finger movements normal  GAIT/STATION:  Uses a walker   DIAGNOSTIC DATA (LABS, IMAGING, TESTING) - I reviewed patient records, labs, notes, testing and imaging myself where available.  Lab Results  Component Value  Date   WBC 9.8 03/06/2024   HGB 9.3 (L) 03/06/2024   HCT 29.0 (L) 03/06/2024   MCV 102.8 (H) 03/06/2024   PLT 368 03/06/2024      Component Value Date/Time   NA 135 03/08/2024 1337   K 4.4 03/08/2024 1337   CL 100 03/08/2024 1337   CO2 24 03/08/2024 1337   GLUCOSE 150 (H) 03/08/2024 1337   BUN 13 03/08/2024 1337   CREATININE 0.58 (L) 03/08/2024 1337   CALCIUM  9.3 03/08/2024 1337   PROT 5.9 (L) 03/06/2024 0055   ALBUMIN 2.5 (L) 03/06/2024 0055   AST 50 (H) 03/06/2024 0055   ALT 59 (H) 03/06/2024 0055   ALKPHOS 105 03/06/2024 0055   BILITOT 0.4 03/06/2024 0055   GFRNONAA >60 03/08/2024 1337   GFRAA >60 04/19/2019 1848   No results found for: CHOL, HDL, LDLCALC, LDLDIRECT, TRIG Lab Results  Component Value Date   HGBA1C 6.1 (H) 01/31/2024   Lab Results  Component Value Date   VITAMINB12 845 01/22/2016   Lab Results  Component Value Date   TSH 30.797 (H) 02/03/2024   Head CT 01/30/2024 1. Acute 9 mm thick right cerebral convexity subdural hemorrhage which extends along the right tentorial leaflet. Resulting 3 mm leftward midline shift.  EEG 02/05/2024 - Seizures without clinical signs, right posterior quadrant.  - Lateralized rhythmic delta slowing, right hemisphere ( LRDA) - Continuous slow, generalized and lateralized right hemisphere   ASSESSMENT AND PLAN  63 y.o. year old male  with Epilepsy, hypertension, hyperlipidemia, diabetes, Hypothyroidism, anxiety   Epilepsy with recent breakthrough seizures Epilepsy with grand mal seizures since 2005, previously controlled with Depakote . Recent breakthrough seizures in May 2025, resulting in subdural hematoma. Depakote  was discontinued due to lack of efficacy. Currently managed with phenobarbital  and Vimpat  (lacosamide ), effectively controlling grand mal seizures. - Order blood work to check phenobarbital  and Vimpat  levels - Prescribe 90-day supply of phenobarbital  - Attempt to prescribe 90-day supply of  Vimpat  - Instruct to contact pharmacy to ensure timely medication delivery  Subdural hematomas status post surgical intervention Subdural hematomas developed following a fall during a grand mal seizure in May 2025, leading to emergency surgical intervention with boreholes due to hematoma formation and brain swelling. Post-surgical recovery has been significant, with improvement in motor function and cessation of seizures.  Cognitive impairment Cognitive impairment with difficulty recalling dates and performing simple calculations.  Deconditioning and generalized weakness Deconditioning and generalized weakness following prolonged hospitalization and recovery from seizures and surgical intervention. Difficulty with mobility and activities of daily living. - Order blood work - Recommend physical therapy for strengthening - Follow up home rehabilitation if insurance permits - Advise to continue home exercises as instructed    1. Partial idiopathic epilepsy with seizures of localized onset, not intractable, without status epilepticus (HCC)   2. Therapeutic drug monitoring   3. Subdural hematoma [facility])     Patient Instructions  Continue current medications Will check Phenobarbital  and Lacosamide  levels  Please call for any seizures  Return in 6 to 8 months or sooner if worse    Per   [place]V statutes, patients with seizures are not allowed to drive until they have been seizure-free for six months.  Other recommendations include using caution when  using heavy equipment or power tools. Avoid working on ladders or at heights. Take showers instead of baths.  Do not swim alone.  Ensure the water  temperature is not too high on the home water  heater. Do not go swimming alone. Do not lock yourself in a room alone (i.e. bathroom). When caring for infants or small children, sit down when holding, feeding, or changing them to minimize risk of injury to the child in the event you have a seizure.  Maintain good sleep hygiene. Avoid alcohol.  Also recommend adequate sleep, hydration, good diet and minimize stress.   During the Seizure  - First, ensure adequate ventilation and place patients on the floor on their left side  Loosen clothing around the neck and ensure the airway is patent. If the patient is clenching the teeth, do not force the mouth open with any object as this can cause severe damage - Remove all items from the surrounding that can be hazardous. The patient may be oblivious to what's happening and may not even know what he or she is doing. If the patient is confused and wandering, either gently guide him/her away and block access to outside areas - Reassure the individual and be comforting - Call 911. In most cases, the seizure ends before EMS arrives. However, there are cases when seizures may last over 3 to 5 minutes. Or the individual may have developed breathing difficulties or severe injuries. If a pregnant patient or a person with diabetes develops a seizure, it is prudent to call an ambulance. - Finally, if the patient does not regain full consciousness, then call EMS. Most patients will remain confused for about 45 to 90 minutes after a seizure, so you must use judgment in calling for help. - Avoid restraints but make sure the patient is in a bed with padded side rails - Place the individual in a lateral position with the neck slightly flexed; this will help the saliva drain from the mouth and prevent the tongue from falling backward - Remove all nearby furniture and other hazards from the area - Provide verbal assurance as the individual is regaining consciousness - Provide the patient with privacy if possible - Call for help and start treatment as ordered by the caregiver   After the Seizure (Postictal Stage)  After a seizure, most patients experience confusion, fatigue, muscle pain and/or a headache. Thus, one should permit the individual to sleep. For the next  few days, reassurance is essential. Being calm and helping reorient the person is also of importance.  Most seizures are painless and end spontaneously. Seizures are not harmful to others but can lead to complications such as stress on the lungs, brain and the heart. Individuals with prior lung problems may develop labored breathing and respiratory distress.    Discussed Patients with epilepsy have a small risk of sudden unexpected death, a condition referred to as sudden unexpected death in epilepsy (SUDEP). SUDEP is defined specifically as the sudden, unexpected, witnessed or unwitnessed, nontraumatic and nondrowning death in patients with epilepsy with or without evidence for a seizure, and excluding documented status epilepticus, in which post mortem examination does not reveal a structural or toxicologic cause for death     Orders Placed This Encounter  Procedures   Lacosamide    Phenobarbital  level   CMP    Meds ordered this encounter  Medications   lacosamide  (VIMPAT ) 200 MG TABS tablet    Sig: Take 1 tablet (200 mg total) by mouth 2 (two)  times daily.    Dispense:  180 tablet    Refill:  3   DISCONTD: PHENobarbital  (LUMINAL) 64.8 MG tablet    Sig: Take 1 tablet (64.8 mg total) by mouth 2 (two) times daily.    Dispense:  180 tablet    Refill:  3   PHENobarbital  (LUMINAL) 64.8 MG tablet    Sig: Take 1 tablet (64.8 mg total) by mouth 2 (two) times daily.    Dispense:  180 tablet    Refill:  3    Return in about 6 months (around 10/29/2024).   Pastor Falling, MD 04/28/2024, 7:54 PM  [facility] [address] [place], [place] [address] [phone]

## 2024-04-28 NOTE — Patient Instructions (Signed)
 Continue current medications Will check Phenobarbital  and Lacosamide  levels  Please call for any seizures  Return in 6 to 8 months or sooner if worse

## 2024-04-29 ENCOUNTER — Other Ambulatory Visit: Payer: Self-pay | Admitting: Neurology

## 2024-04-29 ENCOUNTER — Telehealth: Payer: Self-pay | Admitting: Neurology

## 2024-04-29 MED ORDER — PHENOBARBITAL 64.8 MG PO TABS: 64.8000 mg | ORAL_TABLET | Freq: Two times a day (BID) | ORAL | 3 refills | Status: DC

## 2024-04-29 NOTE — Telephone Encounter (Signed)
 Last seen yesterday, next appt 12/16/24 Dispenses   Dispensed Days Supply Quantity Provider Pharmacy  PHENOBARBITAL  64.8 MG TABLET 03/31/2024 30 60 each Rena Luke POUR, MD [facility] [phone] - G...  phenobarbital  64.8 mg tablet 02/25/2024 30  Brown, Amber K, NP [facility] .SABRASABRA

## 2024-04-29 NOTE — Telephone Encounter (Signed)
 Pt wife called to  request medication  be sent to [facility] since [facility]  informed Pt that  medication is on Back order and will not be able to give Pt medication  right now the patient wife explained Pt  only have one day left of medication is it possible to send order to   [address], [place], [place] [address]  [phone] Today's hours Store & Photo: Open, closes at 10:00 PM Pharmacy: Open, closes at 9:00 PM Pharmacy closes for lunch from 1:30 PM to 2:00 PM   PHENobarbital  (LUMINAL) 64.8 MG tablet

## 2024-04-29 NOTE — Telephone Encounter (Signed)
 Done

## 2024-04-29 NOTE — Telephone Encounter (Signed)
 Notified patient medication has been sent.

## 2024-04-30 ENCOUNTER — Institutional Professional Consult (permissible substitution): Payer: Medicare (Managed Care) | Admitting: Neurology

## 2024-05-01 ENCOUNTER — Ambulatory Visit: Payer: Medicare (Managed Care) | Admitting: Physician Assistant

## 2024-05-01 ENCOUNTER — Ambulatory Visit: Payer: Self-pay | Admitting: Neurology

## 2024-05-01 LAB — COMPREHENSIVE METABOLIC PANEL WITH GFR
ALT: 9 IU/L (ref 0–44)
AST: 11 IU/L (ref 0–40)
Albumin: 4.6 g/dL (ref 3.9–4.9)
Alkaline Phosphatase: 76 IU/L (ref 44–121)
BUN/Creatinine Ratio: 11 (ref 10–24)
BUN: 7 mg/dL — ABNORMAL LOW (ref 8–27)
Bilirubin Total: <0.2 mg/dL (ref 0.0–1.2)
CO2: 24 mmol/L (ref 20–29)
Calcium: 9.5 mg/dL (ref 8.6–10.2)
Chloride: 99 mmol/L (ref 96–106)
Creatinine, Ser: 0.61 mg/dL — ABNORMAL LOW (ref 0.76–1.27)
Globulin, Total: 3 g/dL (ref 1.5–4.5)
Glucose: 87 mg/dL (ref 70–99)
Potassium: 3.9 mmol/L (ref 3.5–5.2)
Sodium: 138 mmol/L (ref 134–144)
Total Protein: 7.6 g/dL (ref 6.0–8.5)
eGFR: 107 mL/min/1.73 (ref >59)

## 2024-05-01 LAB — LACOSAMIDE: Lacosamide: 4.6 ug/mL — AB (ref 5.0–10.0)

## 2024-05-01 LAB — PHENOBARBITAL LEVEL: Phenobarbital, Serum: 21 ug/mL (ref 15–40)

## 2024-05-13 ENCOUNTER — Encounter (HOSPITAL_COMMUNITY): Payer: Self-pay

## 2024-05-13 ENCOUNTER — Emergency Department (HOSPITAL_COMMUNITY): Payer: Medicare (Managed Care)

## 2024-05-13 ENCOUNTER — Emergency Department (HOSPITAL_COMMUNITY)
Admission: EM | Admit: 2024-05-13 | Discharge: 2024-05-13 | Disposition: A | Discharge status: Home / Self Care | Payer: Medicare (Managed Care) | Attending: Emergency Medicine | Admitting: Emergency Medicine

## 2024-05-13 ENCOUNTER — Other Ambulatory Visit: Payer: Self-pay

## 2024-05-13 DIAGNOSIS — Z79899 Other long term (current) drug therapy: Secondary | ICD-10-CM | POA: Insufficient documentation

## 2024-05-13 DIAGNOSIS — I10 Essential (primary) hypertension: Secondary | ICD-10-CM | POA: Insufficient documentation

## 2024-05-13 DIAGNOSIS — E119 Type 2 diabetes mellitus without complications: Secondary | ICD-10-CM | POA: Diagnosis not present

## 2024-05-13 DIAGNOSIS — J449 Chronic obstructive pulmonary disease, unspecified: Secondary | ICD-10-CM | POA: Insufficient documentation

## 2024-05-13 DIAGNOSIS — W19XXXA Unspecified fall, initial encounter: Secondary | ICD-10-CM | POA: Insufficient documentation

## 2024-05-13 DIAGNOSIS — Y92002 Bathroom of unspecified non-institutional (private) residence single-family (private) house as the place of occurrence of the external cause: Secondary | ICD-10-CM | POA: Diagnosis not present

## 2024-05-13 DIAGNOSIS — Z7984 Long term (current) use of oral hypoglycemic drugs: Secondary | ICD-10-CM | POA: Diagnosis not present

## 2024-05-13 DIAGNOSIS — S0003XA Contusion of scalp, initial encounter: Secondary | ICD-10-CM | POA: Diagnosis not present

## 2024-05-13 DIAGNOSIS — S80211A Abrasion, right knee, initial encounter: Secondary | ICD-10-CM | POA: Diagnosis not present

## 2024-05-13 DIAGNOSIS — E039 Hypothyroidism, unspecified: Secondary | ICD-10-CM | POA: Diagnosis not present

## 2024-05-13 DIAGNOSIS — S0990XA Unspecified injury of head, initial encounter: Secondary | ICD-10-CM | POA: Diagnosis present

## 2024-05-13 DIAGNOSIS — S80212A Abrasion, left knee, initial encounter: Secondary | ICD-10-CM | POA: Diagnosis not present

## 2024-05-13 DIAGNOSIS — S0083XA Contusion of other part of head, initial encounter: Secondary | ICD-10-CM

## 2024-05-13 LAB — CBC
HCT: 38.2 % — ABNORMAL LOW (ref 39.0–52.0)
Hemoglobin: 12.4 g/dL — ABNORMAL LOW (ref 13.0–17.0)
MCH: 32 pg (ref 26.0–34.0)
MCHC: 32.5 g/dL (ref 30.0–36.0)
MCV: 98.7 fL (ref 80.0–100.0)
Platelets: 226 K/uL (ref 150–400)
RBC: 3.87 MIL/uL — ABNORMAL LOW (ref 4.22–5.81)
RDW: 13.1 % (ref 11.5–15.5)
WBC: 6.7 K/uL (ref 4.0–10.5)
nRBC: 0 % (ref 0.0–0.2)

## 2024-05-13 LAB — COMPREHENSIVE METABOLIC PANEL WITH GFR
ALT: 13 U/L (ref 0–44)
AST: 19 U/L (ref 15–41)
Albumin: 3.8 g/dL (ref 3.5–5.0)
Alkaline Phosphatase: 55 U/L (ref 38–126)
Anion gap: 12 (ref 5–15)
BUN: 10 mg/dL (ref 8–23)
CO2: 29 mmol/L (ref 22–32)
Calcium: 9.3 mg/dL (ref 8.9–10.3)
Chloride: 101 mmol/L (ref 98–111)
Creatinine, Ser: 0.75 mg/dL (ref 0.61–1.24)
GFR, Estimated: >60 mL/min (ref >60)
Glucose, Bld: 91 mg/dL (ref 70–99)
Potassium: 3.9 mmol/L (ref 3.5–5.1)
Sodium: 142 mmol/L (ref 135–145)
Total Bilirubin: 0.2 mg/dL (ref 0.0–1.2)
Total Protein: 6.9 g/dL (ref 6.5–8.1)

## 2024-05-13 LAB — ETHANOL: Alcohol, Ethyl (B): <15 mg/dL (ref <15)

## 2024-05-13 LAB — PROTIME-INR
INR: 1 (ref 0.8–1.2)
Prothrombin Time: 13.2 s (ref 11.4–15.2)

## 2024-05-13 LAB — PHENOBARBITAL LEVEL: Phenobarbital: 24 ug/mL (ref 15.0–40.0)

## 2024-05-13 LAB — TSH: TSH: 22.872 u[IU]/mL — ABNORMAL HIGH (ref 0.350–4.500)

## 2024-05-13 MED ORDER — SODIUM CHLORIDE 0.9 % IV BOLUS: 500.0000 mL | Freq: Once | INTRAVENOUS | Status: DC

## 2024-05-13 NOTE — ED Triage Notes (Signed)
 Patient BIB EMS from home after an accidental fall while wife was helping to the bathroom. Wife states he hit his head on the shower when he fell. No thinners. Patient c/o lower back pain. Patient does have a history of memory loss, but has been AXOX4.

## 2024-05-13 NOTE — ED Notes (Signed)
 Patient assisted onto bedpan.
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
## 2024-05-13 NOTE — ED Notes (Signed)
 Upon walking in patient's room this writer noticed the patient has taken off all of his monitoring devices and has taken his IV out. When questioned, patient states, I don't like the way I have been treated here and I want to go home.

## 2024-05-13 NOTE — ED Notes (Signed)
 CCMD called.

## 2024-05-13 NOTE — ED Notes (Signed)
 Patient was assisted to the bathroom by NT, educated where the call bell was in the bathroom and then left unmonitored. Staff member passing bathroom and heard the patient yelp, upon opening the door to the bathroom staff noticed patient on the floor where he had fallen off the toilet. Patient assisted off the floor, back in the bed and back to the room where EDP assessed.
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
## 2024-05-26 ENCOUNTER — Encounter (HOSPITAL_COMMUNITY): Payer: Self-pay

## 2024-05-26 ENCOUNTER — Other Ambulatory Visit: Payer: Self-pay

## 2024-05-26 ENCOUNTER — Emergency Department (HOSPITAL_COMMUNITY)
Admission: EM | Admit: 2024-05-26 | Discharge: 2024-05-26 | Disposition: A | Discharge status: Home / Self Care | Payer: Medicare (Managed Care) | Attending: Emergency Medicine | Admitting: Emergency Medicine

## 2024-05-26 DIAGNOSIS — R569 Unspecified convulsions: Secondary | ICD-10-CM | POA: Diagnosis present

## 2024-05-26 DIAGNOSIS — R451 Restlessness and agitation: Secondary | ICD-10-CM | POA: Insufficient documentation

## 2024-05-26 DIAGNOSIS — R296 Repeated falls: Secondary | ICD-10-CM | POA: Insufficient documentation

## 2024-05-26 NOTE — ED Provider Notes (Signed)
 [place] EMERGENCY DEPARTMENT AT [facility] Provider Note   CSN: [id number] Arrival date & time: 05/26/24  1640     Patient presents with: Fall and Seizures   [name] is a 63 y.o. male.   HPI Patient's wife reports that she is home with the patient almost all the time.  He does have seizure disorder and frequent falls.  She reports that she instructs him to stay on the couch anytime she is not in the near vicinity.  She needed to go to the grocery store briefly and while she was gone the patient apparently got up and reports that he had seizure activity.  He was by himself.  He then called 911 to come and assist him.  He now reports that he did not want to be transported to the hospital and they got there and that they dragged him out of the house.  Patient's wife reports that there are no new symptoms and this is typical for his seizure and falls but no indication that he has any new injury.  Patient denies any areas of pain from fall or different from baseline.  He is very agitated because he reports he cannot use a urinal lying down and he must have his socks and shoes on with a diaper underneath him seated on a commode.  Patient's wife reports that that has just always been what he needs to urinate, and it is more of a psychological barrier he has around urinating.  He denies he has been having difficulty or inability to urinate when at home.    Prior to Admission medications   Medication Sig Start Date End Date Taking? Authorizing Provider  acetaminophen  (TYLENOL ) 500 MG tablet Take 1 tablet (500 mg total) by mouth 3 (three) times daily. 02/24/24   Regalado, Belkys A, MD  albuterol  (PROVENTIL  HFA;VENTOLIN  HFA) 108 (90 BASE) MCG/ACT inhaler Inhale 2 puffs into the lungs every 4 (four) hours as needed for wheezing or shortness of breath.    [provider]  ALPRAZolam  (XANAX ) 0.5 MG tablet Take 1 tablet (0.5 mg total) by mouth 2 (two) times daily. 03/08/24   Perri DELENA Meliton Mickey., MD  Ascorbic Acid (VITAMIN C PO) Take 1 tablet by mouth daily.    [provider]  Calcium  Carb-Cholecalciferol (CALCIUM  HIGH POTENCY/VITAMIN D) 600-5 MG-MCG TABS Take 1 tablet by mouth daily. 05/16/21   [provider]  citalopram  (CELEXA ) 20 MG tablet Take 1 tablet (20 mg total) by mouth daily. 02/24/24 04/28/25  Regalado, Belkys A, MD  guaiFENesin  (ROBITUSSIN) 100 MG/5ML liquid Take 15 mLs by mouth every 6 (six) hours. 02/24/24   Regalado, Belkys A, MD  lacosamide  (VIMPAT ) 200 MG TABS tablet Take 1 tablet (200 mg total) by mouth 2 (two) times daily. 04/28/24   Gregg Lek, MD  levothyroxine  (SYNTHROID , LEVOTHROID) 150 MCG tablet Take 150 mcg by mouth daily before breakfast.  12/30/14   [provider]  metFORMIN (GLUCOPHAGE-XR) 500 MG 24 hr tablet Take 500 mg by mouth in the morning and at bedtime. 02/19/19   [provider]  metoprolol  tartrate (LOPRESSOR ) 25 MG tablet Take 2 tablets (50 mg total) by mouth 2 (two) times daily. 03/08/24   Perri DELENA Meliton Mickey., MD  Multiple Vitamin (MULTIVITAMIN WITH MINERALS) TABS tablet Take 1 tablet by mouth daily. 02/25/24   Regalado, Belkys A, MD  nicotine  (NICODERM CQ  - DOSED IN MG/24 HOURS) 21 mg/24hr patch Place 1 patch (21 mg total) onto the  skin daily. 02/25/24   Regalado, Belkys A, MD  omeprazole (PRILOSEC) 20 MG capsule Take 20 mg by mouth daily.    [provider]  PHENobarbital  (LUMINAL) 64.8 MG tablet Take 1 tablet (64.8 mg total) by mouth 2 (two) times daily. 04/29/24 04/24/25  Camara, Amadou, MD  polyethylene glycol (MIRALAX  / GLYCOLAX ) 17 g packet Take 17 g by mouth daily. 02/25/24   Regalado, Belkys A, MD  Tiotropium Bromide  Monohydrate (SPIRIVA  RESPIMAT) 2.5 MCG/ACT AERS Inhale 2 puffs into the lungs daily.    [provider]    Allergies: Levetiracetam , Tegretol [carbamazepine], Penicillins, and Topiramate    Review of Systems  Updated Vital Signs BP (!) 185/89   Pulse (!) 101   Resp  14   SpO2 99%   Physical Exam Constitutional:      Comments: Patient is awake and alert.  He is mildly agitated and hostile.  No respiratory distress at rest.  HENT:     Head: Normocephalic and atraumatic.     Mouth/Throat:     Pharynx: Oropharynx is clear.  Eyes:     Extraocular Movements: Extraocular movements intact.  Cardiovascular:     Rate and Rhythm: Normal rate and regular rhythm.  Pulmonary:     Effort: Pulmonary effort is normal.     Breath sounds: Normal breath sounds.  Chest:     Chest wall: No tenderness.  Abdominal:     General: There is no distension.     Palpations: Abdomen is soft.     Tenderness: There is no abdominal tenderness. There is no guarding.  Musculoskeletal:     Comments: No extremity deformities.  Patient does not have any significant peripheral edema.  Calves are soft and pliable.  Patient does have skin changes of chronic venous stasis with thinning and small bruises but no active wounds.  Skin:    General: Skin is warm and dry.  Neurological:     Comments: Patient is awake and alert.  He is mildly agitated and slightly hostile but cooperative.  He is goal-directed.  He is slightly tremulous but using both upper extremities purposely to readjust the urinal that is between his legs although he reports he cannot pee into it.     (all labs ordered are listed, but only abnormal results are displayed) Labs Reviewed - No data to display  EKG: None  Radiology: No results found.   Procedures   Medications Ordered in the ED - No data to display                                  Medical Decision Making  Patient presents as outlined.  Patient has prior history of seizure and frequent falls.  Patient's wife reports that they have frequently had to have EMS to the house for the similar circumstances.  At this time they do not report any injury associated with this and did not feel that there are any acute changes with the patient's baseline  function.  Patient's wife did not feel that further diagnostic testing or imaging is needed today.  She reports that she is almost always home with him and typically they would not call EMS under the circumstance but since she was not home and he called they ended up transporting him.  After review of EMR and exam of the patient with discussion, I do feel patient is stable to discharge at this time per patient  and his wife's wishes.  Patient and wife are encouraged to follow-up with PCP and return if they have any concerning or changing symptoms.     Final diagnoses:  Seizure [facility])    ED Discharge Orders     None          Armenta Canning, MD 05/26/24 [id number]

## 2024-05-26 NOTE — Discharge Instructions (Signed)
 1.  Follow-up with your family doctor as needed.  Continue your regular medications. 2.  Return if you feel that there are any changing or new symptoms compared to normal.

## 2024-05-26 NOTE — ED Triage Notes (Signed)
 Patient BIB EMS from home after a fall and possible seizure. Patient's wife was away from home when he fell and states that he has been having increased confusion today. Patient's wife states that the only time he, gets like this is when he has a seizure.

## 2024-09-30 ENCOUNTER — Emergency Department (HOSPITAL_COMMUNITY)

## 2024-09-30 ENCOUNTER — Other Ambulatory Visit: Payer: Self-pay

## 2024-09-30 ENCOUNTER — Emergency Department (HOSPITAL_COMMUNITY)
Admission: EM | Admit: 2024-09-30 | Discharge: 2024-09-30 | Disposition: A | Discharge status: Home / Self Care | Attending: Emergency Medicine | Admitting: Emergency Medicine

## 2024-09-30 DIAGNOSIS — S0990XA Unspecified injury of head, initial encounter: Secondary | ICD-10-CM | POA: Insufficient documentation

## 2024-09-30 DIAGNOSIS — Z7984 Long term (current) use of oral hypoglycemic drugs: Secondary | ICD-10-CM | POA: Diagnosis not present

## 2024-09-30 DIAGNOSIS — E119 Type 2 diabetes mellitus without complications: Secondary | ICD-10-CM | POA: Insufficient documentation

## 2024-09-30 DIAGNOSIS — W19XXXA Unspecified fall, initial encounter: Secondary | ICD-10-CM | POA: Insufficient documentation

## 2024-09-30 DIAGNOSIS — J449 Chronic obstructive pulmonary disease, unspecified: Secondary | ICD-10-CM | POA: Insufficient documentation

## 2024-09-30 DIAGNOSIS — R296 Repeated falls: Secondary | ICD-10-CM

## 2024-09-30 DIAGNOSIS — Y92019 Unspecified place in single-family (private) house as the place of occurrence of the external cause: Secondary | ICD-10-CM | POA: Insufficient documentation

## 2024-09-30 LAB — COMPREHENSIVE METABOLIC PANEL WITH GFR
ALT: 23 U/L (ref 0–44)
AST: 32 U/L (ref 15–41)
Albumin: 3.9 g/dL (ref 3.5–5.0)
Alkaline Phosphatase: 107 U/L (ref 38–126)
Anion gap: 9 (ref 5–15)
BUN: 15 mg/dL (ref 8–23)
CO2: 27 mmol/L (ref 22–32)
Calcium: 8.7 mg/dL — ABNORMAL LOW (ref 8.9–10.3)
Chloride: 99 mmol/L (ref 98–111)
Creatinine, Ser: 0.87 mg/dL (ref 0.61–1.24)
GFR, Estimated: >60 mL/min
Glucose, Bld: 89 mg/dL (ref 70–99)
Potassium: 3.8 mmol/L (ref 3.5–5.1)
Sodium: 134 mmol/L — ABNORMAL LOW (ref 135–145)
Total Bilirubin: 0.3 mg/dL (ref 0.0–1.2)
Total Protein: 6.5 g/dL (ref 6.5–8.1)

## 2024-09-30 LAB — CBC WITH DIFFERENTIAL/PLATELET
Abs Immature Granulocytes: 0.01 K/uL (ref 0.00–0.07)
Basophils Absolute: 0.1 K/uL (ref 0.0–0.1)
Basophils Relative: 1 %
Eosinophils Absolute: 0.1 K/uL (ref 0.0–0.5)
Eosinophils Relative: 1 %
HCT: 36 % — ABNORMAL LOW (ref 39.0–52.0)
Hemoglobin: 12.1 g/dL — ABNORMAL LOW (ref 13.0–17.0)
Immature Granulocytes: 0 %
Lymphocytes Relative: 48 %
Lymphs Abs: 2.4 K/uL (ref 0.7–4.0)
MCH: 31.3 pg (ref 26.0–34.0)
MCHC: 33.6 g/dL (ref 30.0–36.0)
MCV: 93 fL (ref 80.0–100.0)
Monocytes Absolute: 0.3 K/uL (ref 0.1–1.0)
Monocytes Relative: 7 %
Neutro Abs: 2.1 K/uL (ref 1.7–7.7)
Neutrophils Relative %: 43 %
Platelets: 256 K/uL (ref 150–400)
RBC: 3.87 MIL/uL — ABNORMAL LOW (ref 4.22–5.81)
RDW: 15.3 % (ref 11.5–15.5)
WBC: 5 K/uL (ref 4.0–10.5)
nRBC: 0 % (ref 0.0–0.2)

## 2024-09-30 LAB — CBG MONITORING, ED: Glucose-Capillary: 83 mg/dL (ref 70–99)

## 2024-09-30 MED ORDER — PHENOBARBITAL 32.4 MG PO TABS
64.8000 mg | ORAL_TABLET | Freq: Once | ORAL | Status: AC
  Administered 2024-09-30: 64.8 mg via ORAL
  Filled 2024-09-30: qty 2

## 2024-09-30 MED ORDER — LACOSAMIDE 50 MG PO TABS
200.0000 mg | ORAL_TABLET | Freq: Once | ORAL | Status: AC
  Administered 2024-09-30: 200 mg via ORAL
  Filled 2024-09-30: qty 4

## 2024-09-30 NOTE — ED Notes (Signed)
 Help get patient undressed into a gown on the monitor did EKG patient got patient a warm blanket patient has call bell in reach

## 2024-09-30 NOTE — ED Triage Notes (Signed)
 Pt BIBA from home for witnessed fall that happened last night. Wife called ems as pt reported of headache. Hx of brain bleed and seizures. Witnessed fall, no head injury 110/80, 94% RA, HR 66 RR 16. Not on thinners. Per ems pt unsteady on feet

## 2024-09-30 NOTE — Discharge Instructions (Signed)
 You were seen in the ER after a fall with a head injury We gave you your morning seizure medications here There is no evidence of head injury on the CAT scan and your blood work looked okay Follow-up with your doctor within 1 week Continue taking all previously prescribed medications Return to the Emergency Department for repeated falls or other concerns

## 2024-09-30 NOTE — ED Notes (Signed)
"  Provided pt with apple juice.   "

## 2024-09-30 NOTE — ED Notes (Signed)
 Attempted phone call to wife for pickup. No answer

## 2024-09-30 NOTE — ED Provider Notes (Signed)
 " [name] Provider Note   CSN: [id number] Arrival date & time: 09/30/24  9088     Patient presents with: No chief complaint on file.   [name] is a 64 y.o. male.  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  Patient had suffered a witnessed fall at home last night.  He was not evaluated last night but began to complain of a posterior headache to his wife at home who called EMS.  Denies other injuries no recent illness.  No neck pain chest pain shortness of breath abdominal pain or pain in extremities.  No recent seizure activity compliant with antiepileptic regimen.  No anticoagulation   HPI     Prior to Admission medications  Medication Sig Start Date End Date Taking? Authorizing Provider  acetaminophen  (TYLENOL ) 500 MG tablet Take 1 tablet (500 mg total) by mouth 3 (three) times daily. 02/24/24   Regalado, Belkys A, MD  albuterol  (PROVENTIL  HFA;VENTOLIN  HFA) 108 (90 BASE) MCG/ACT inhaler Inhale 2 puffs into the lungs every 4 (four) hours as needed for wheezing or shortness of breath.    [provider]  ALPRAZolam  (XANAX ) 0.5 MG tablet Take 1 tablet (0.5 mg total) by mouth 2 (two) times daily. 03/08/24   Perri DELENA Meliton Mickey., MD  Ascorbic Acid (VITAMIN C PO) Take 1 tablet by mouth daily.    [provider]  Calcium  Carb-Cholecalciferol (CALCIUM  HIGH POTENCY/VITAMIN D) 600-5 MG-MCG TABS Take 1 tablet by mouth daily. 05/16/21   [provider]  citalopram  (CELEXA ) 20 MG tablet Take 1 tablet (20 mg total) by mouth daily. 02/24/24 04/28/25  Regalado, Belkys A, MD  guaiFENesin  (ROBITUSSIN) 100 MG/5ML liquid Take 15 mLs by mouth every 6 (six) hours. 02/24/24   Regalado, Belkys A, MD  lacosamide  (VIMPAT ) 200 MG TABS tablet Take 1 tablet (200 mg total) by mouth 2 (two) times daily. 04/28/24    Gregg Lek, MD  levothyroxine  (SYNTHROID , LEVOTHROID) 150 MCG tablet Take 150 mcg by mouth daily before breakfast.  12/30/14   [provider]  metFORMIN (GLUCOPHAGE-XR) 500 MG 24 hr tablet Take 500 mg by mouth in the morning and at bedtime. 02/19/19   [provider]  metoprolol  tartrate (LOPRESSOR ) 25 MG tablet Take 2 tablets (50 mg total) by mouth 2 (two) times daily. 03/08/24   Perri DELENA Meliton Mickey., MD  Multiple Vitamin (MULTIVITAMIN WITH MINERALS) TABS tablet Take 1 tablet by mouth daily. 02/25/24   Regalado, Belkys A, MD  nicotine  (NICODERM CQ  - DOSED IN MG/24 HOURS) 21 mg/24hr patch Place 1 patch (21 mg total) onto the skin daily. 02/25/24   Regalado, Belkys A, MD  omeprazole (PRILOSEC) 20 MG capsule Take 20 mg by mouth daily.    [provider]  PHENobarbital  (LUMINAL) 64.8 MG tablet Take 1 tablet (64.8 mg total) by mouth 2 (two) times daily. 04/29/24 04/24/25  Camara, Amadou, MD  polyethylene glycol (MIRALAX  / GLYCOLAX ) 17 g packet Take 17 g by mouth daily. 02/25/24   Regalado, Belkys A, MD  Tiotropium Bromide  Monohydrate (SPIRIVA  RESPIMAT) 2.5 MCG/ACT AERS Inhale 2 puffs into the lungs daily.    [provider]    Allergies: Levetiracetam , Tegretol [carbamazepine], Penicillins, and Topiramate    Review of Systems  Updated Vital Signs BP (!) 148/115   Pulse 65   Temp 97.7 F (  36.5 C) (Oral)   Resp 17   SpO2 96%   Physical Exam Vitals and nursing note reviewed.  HENT:     Head: Normocephalic and atraumatic.  Eyes:     Pupils: Pupils are equal, round, and reactive to light.  Cardiovascular:     Rate and Rhythm: Normal rate and regular rhythm.  Pulmonary:     Effort: Pulmonary effort is normal.     Breath sounds: Normal breath sounds.  Abdominal:     Palpations: Abdomen is soft.     Tenderness: There is no abdominal tenderness.  Musculoskeletal:     Cervical back: Neck supple. No tenderness.     Comments: Full active range of motion upper and  lower extremities with no evidence of trauma No midline tenderness step-off deformity of back  Skin:    General: Skin is warm and dry.  Neurological:     Mental Status: He is alert.  Psychiatric:        Mood and Affect: Mood normal.     (all labs ordered are listed, but only abnormal results are displayed) Labs Reviewed  COMPREHENSIVE METABOLIC PANEL WITH GFR - Abnormal; Notable for the following components:      Result Value   Sodium 134 (*)    Calcium  8.7 (*)    All other components within normal limits  CBC WITH DIFFERENTIAL/PLATELET - Abnormal; Notable for the following components:   RBC 3.87 (*)    Hemoglobin 12.1 (*)    HCT 36.0 (*)    All other components within normal limits  CBG MONITORING, ED    EKG: None  Radiology: CT Cervical Spine Wo Contrast Result Date: 09/30/2024 EXAM: CT CERVICAL SPINE WITHOUT CONTRAST 09/30/2024 10:50:01 AM TECHNIQUE: CT of the cervical spine was performed without the administration of intravenous contrast. Multiplanar reformatted images are provided for review. Automated exposure control, iterative reconstruction, and/or weight based adjustment of the mA/kV was utilized to reduce the radiation dose to as low as reasonably achievable. COMPARISON: None available. CLINICAL HISTORY: Neck trauma (Age >= 65y) FINDINGS: BONES AND ALIGNMENT: No acute fracture or traumatic malalignment. DEGENERATIVE CHANGES: Multilevel mild degenerative changes of the spine with facet arthropathy and uncovertebral arthropathy. Multilevel moderate-to-severe osseous neural foraminal stenosis on the left at the C3-C4 and C4-C5 levels. No severe osseous central canal stenosis. SOFT TISSUES: No prevertebral soft tissue swelling. LUNGS: Paraseptal and centrilobular emphysematous changes. VASCULATURE: Atherosclerotic plaque of the aorta. IMPRESSION: 1. No evidence of acute traumatic injury. 2. Multilevel moderate-to-severe osseous neural foraminal stenosis on the left at the C3-C4  and C4-C5 levels. 3. Pulmonary emphysema is an independent risk factor for lung cancer. Recommend consideration for evaluation for a low-dose CT lung cancer screening program. Electronically signed by: Morgane Naveau MD 09/30/2024 11:00 AM EST RP Workstation: [id number]   CT Head Wo Contrast Result Date: 09/30/2024 EXAM: CT HEAD WITHOUT CONTRAST 09/30/2024 10:50:01 AM TECHNIQUE: CT of the head was performed without the administration of intravenous contrast. Automated exposure control, iterative reconstruction, and/or weight based adjustment of the mA/kV was utilized to reduce the radiation dose to as low as reasonably achievable. COMPARISON: 05/13/2024 CLINICAL HISTORY: Head trauma, minor (Age >= 65y) Non con. witnessed fall that happened last night. Wife called ems as pt reported of headache. Hx of brain bleed and seizures. No thinners. FINDINGS: BRAIN AND VENTRICLES: No acute hemorrhage. No evidence of acute infarct. No hydrocephalus. No extra-axial collection. No mass effect or midline shift. Generalized cerebral atrophy and chronic white matter ischemic change. ORBITS:  No acute abnormality. SINUSES: Mild mucosal thickening in left sphenoid sinus. SOFT TISSUES AND SKULL: No acute soft tissue abnormality. No skull fracture. Postsurgical changes in right calvarium. IMPRESSION: 1. No acute intracranial abnormality. Electronically signed by: Morgane Naveau MD 09/30/2024 10:58 AM EST RP Workstation: [id number]   DG Chest Portable 1 View Result Date: 09/30/2024 CLINICAL DATA:  Fall. EXAM: PORTABLE CHEST 1 VIEW COMPARISON:  05/13/2024 FINDINGS: Both lungs are clear. Heart size is normal. The trachea is midline. Negative for a pneumothorax. Old fracture involving the proximal left humerus. IMPRESSION: No active disease. Electronically Signed   By: Juliene Balder M.D.   On: 09/30/2024 09:49     Procedures   Medications Ordered in the ED  lacosamide  (VIMPAT ) tablet 200 mg (200 mg Oral Given 09/30/24 1051)   PHENobarbital  (LUMINAL) tablet 64.8 mg (64.8 mg Oral Given 09/30/24 1050)    Clinical Course as of 09/30/24 1103  Wed Sep 30, 2024  0956 Patient has not taken her morning medications.  Will order antiepileptics here [MP]  1102 Laboratory workup unremarkable.  CT head C-spine chest x-ray showed no acute traumatic findings.  Patient has remained stable here.  Headache better controlled after he took Tylenol  prior to arrival.  Appropriate for discharge with PCP follow-up [MP]    Clinical Course User Index [MP] Pamella Ozell LABOR, DO                                 Medical Decision Making 64 year old male with history as above presenting after fall at home last night.  Witnessed by wife.  Reporting posterior headaches today.  No anticoagulation.  No evidence of head trauma on my exam but given his history of brain bleed status postcraniotomy last year will evaluate for traumatic injury with CT head C-spine and chest x-ray.  No other acute traumatic findings on my exam.  Will obtain laboratory workup to evaluate for metabolic cause of fall as well as EKG to look for dysrhythmia.  Suspect if there is no acute injury or significant lab abnormalities patient would be appropriate for discharge home  Amount and/or Complexity of Data Reviewed Labs: ordered. Radiology: ordered.  Risk Prescription drug management.        Final diagnoses:  Injury of head, initial encounter  Fall in elderly patient    ED Discharge Orders     None          Pamella Ozell LABOR, DO 09/30/24 1103  "

## 2024-09-30 NOTE — ED Notes (Signed)
 Call placed to pt's spouse at [phone] per pt request.  Pt currently on the phone with her

## 2024-10-15 ENCOUNTER — Other Ambulatory Visit: Payer: Self-pay

## 2024-10-15 ENCOUNTER — Other Ambulatory Visit: Payer: Self-pay | Admitting: Neurology

## 2024-10-15 NOTE — Telephone Encounter (Signed)
 Requested Prescriptions   Pending Prescriptions Disp Refills   lacosamide  (VIMPAT ) 200 MG TABS tablet [Pharmacy Med Name: LACOSAMIDE  200MG  TAB] 180 tablet 4    Sig: TAKE ONE TABLET BY MOUTH TWICE A DAY   Last seen 04/28/24 Next appt 12/16/24  Dispenses   Dispensed Days Supply Quantity Provider Pharmacy  lacosamide  200 mg tablet 02/25/2024 30  Brown, Amber K, NP [facility] .[id number]

## 2024-10-16 ENCOUNTER — Emergency Department (HOSPITAL_COMMUNITY)

## 2024-10-16 ENCOUNTER — Inpatient Hospital Stay (HOSPITAL_COMMUNITY)
Admission: EM | Admit: 2024-10-16 | Discharge: 2024-10-21 | DRG: 100 | Disposition: A | Discharge status: Home Health | Attending: Internal Medicine | Admitting: Internal Medicine

## 2024-10-16 DIAGNOSIS — S0990XA Unspecified injury of head, initial encounter: Secondary | ICD-10-CM

## 2024-10-16 DIAGNOSIS — Z7409 Other reduced mobility: Secondary | ICD-10-CM | POA: Diagnosis present

## 2024-10-16 DIAGNOSIS — Z79890 Hormone replacement therapy (postmenopausal): Secondary | ICD-10-CM

## 2024-10-16 DIAGNOSIS — G40909 Epilepsy, unspecified, not intractable, without status epilepticus: Principal | ICD-10-CM | POA: Diagnosis present

## 2024-10-16 DIAGNOSIS — R410 Disorientation, unspecified: Secondary | ICD-10-CM | POA: Diagnosis not present

## 2024-10-16 DIAGNOSIS — Z79899 Other long term (current) drug therapy: Secondary | ICD-10-CM

## 2024-10-16 DIAGNOSIS — S0302XA Dislocation of jaw, left side, initial encounter: Secondary | ICD-10-CM | POA: Diagnosis present

## 2024-10-16 DIAGNOSIS — K219 Gastro-esophageal reflux disease without esophagitis: Secondary | ICD-10-CM | POA: Diagnosis present

## 2024-10-16 DIAGNOSIS — S0269XA Fracture of mandible of other specified site, initial encounter for closed fracture: Secondary | ICD-10-CM | POA: Diagnosis present

## 2024-10-16 DIAGNOSIS — S0993XA Unspecified injury of face, initial encounter: Secondary | ICD-10-CM

## 2024-10-16 DIAGNOSIS — Z8249 Family history of ischemic heart disease and other diseases of the circulatory system: Secondary | ICD-10-CM

## 2024-10-16 DIAGNOSIS — I110 Hypertensive heart disease with heart failure: Secondary | ICD-10-CM | POA: Diagnosis present

## 2024-10-16 DIAGNOSIS — R296 Repeated falls: Secondary | ICD-10-CM | POA: Diagnosis present

## 2024-10-16 DIAGNOSIS — E785 Hyperlipidemia, unspecified: Secondary | ICD-10-CM | POA: Diagnosis present

## 2024-10-16 DIAGNOSIS — E876 Hypokalemia: Secondary | ICD-10-CM | POA: Diagnosis present

## 2024-10-16 DIAGNOSIS — Z7984 Long term (current) use of oral hypoglycemic drugs: Secondary | ICD-10-CM

## 2024-10-16 DIAGNOSIS — Z91138 Patient's unintentional underdosing of medication regimen for other reason: Secondary | ICD-10-CM

## 2024-10-16 DIAGNOSIS — R569 Unspecified convulsions: Principal | ICD-10-CM

## 2024-10-16 DIAGNOSIS — Z87891 Personal history of nicotine dependence: Secondary | ICD-10-CM

## 2024-10-16 DIAGNOSIS — M1990 Unspecified osteoarthritis, unspecified site: Secondary | ICD-10-CM | POA: Diagnosis present

## 2024-10-16 DIAGNOSIS — R001 Bradycardia, unspecified: Secondary | ICD-10-CM | POA: Diagnosis present

## 2024-10-16 DIAGNOSIS — W1830XA Fall on same level, unspecified, initial encounter: Secondary | ICD-10-CM | POA: Diagnosis present

## 2024-10-16 DIAGNOSIS — J439 Emphysema, unspecified: Secondary | ICD-10-CM | POA: Diagnosis present

## 2024-10-16 DIAGNOSIS — T423X6A Underdosing of barbiturates, initial encounter: Secondary | ICD-10-CM | POA: Diagnosis present

## 2024-10-16 DIAGNOSIS — R9431 Abnormal electrocardiogram [ECG] [EKG]: Secondary | ICD-10-CM | POA: Diagnosis present

## 2024-10-16 DIAGNOSIS — F32A Depression, unspecified: Secondary | ICD-10-CM | POA: Diagnosis present

## 2024-10-16 DIAGNOSIS — Z823 Family history of stroke: Secondary | ICD-10-CM

## 2024-10-16 DIAGNOSIS — G9341 Metabolic encephalopathy: Secondary | ICD-10-CM | POA: Diagnosis present

## 2024-10-16 DIAGNOSIS — Z888 Allergy status to other drugs, medicaments and biological substances status: Secondary | ICD-10-CM

## 2024-10-16 DIAGNOSIS — E039 Hypothyroidism, unspecified: Secondary | ICD-10-CM | POA: Diagnosis present

## 2024-10-16 DIAGNOSIS — F0394 Unspecified dementia, unspecified severity, with anxiety: Secondary | ICD-10-CM | POA: Diagnosis present

## 2024-10-16 DIAGNOSIS — Z880 Allergy status to penicillin: Secondary | ICD-10-CM

## 2024-10-16 DIAGNOSIS — S0101XA Laceration without foreign body of scalp, initial encounter: Secondary | ICD-10-CM | POA: Diagnosis present

## 2024-10-16 DIAGNOSIS — F0393 Unspecified dementia, unspecified severity, with mood disturbance: Secondary | ICD-10-CM | POA: Diagnosis present

## 2024-10-16 DIAGNOSIS — Y92009 Unspecified place in unspecified non-institutional (private) residence as the place of occurrence of the external cause: Secondary | ICD-10-CM

## 2024-10-16 DIAGNOSIS — S02602A Fracture of unspecified part of body of left mandible, initial encounter for closed fracture: Secondary | ICD-10-CM

## 2024-10-16 DIAGNOSIS — E871 Hypo-osmolality and hyponatremia: Secondary | ICD-10-CM | POA: Diagnosis present

## 2024-10-16 DIAGNOSIS — Z8349 Family history of other endocrine, nutritional and metabolic diseases: Secondary | ICD-10-CM

## 2024-10-16 DIAGNOSIS — T381X6A Underdosing of thyroid hormones and substitutes, initial encounter: Secondary | ICD-10-CM | POA: Diagnosis present

## 2024-10-16 DIAGNOSIS — E119 Type 2 diabetes mellitus without complications: Secondary | ICD-10-CM | POA: Diagnosis present

## 2024-10-16 DIAGNOSIS — R197 Diarrhea, unspecified: Secondary | ICD-10-CM | POA: Diagnosis present

## 2024-10-16 LAB — URINALYSIS, ROUTINE W REFLEX MICROSCOPIC
Bacteria, UA: NONE SEEN
Bilirubin Urine: NEGATIVE
Glucose, UA: NEGATIVE mg/dL
Hgb urine dipstick: NEGATIVE
Ketones, ur: 5 mg/dL — AB
Leukocytes,Ua: NEGATIVE
Nitrite: NEGATIVE
Protein, ur: 100 mg/dL — AB
Specific Gravity, Urine: 1.024 (ref 1.005–1.030)
pH: 6 (ref 5.0–8.0)

## 2024-10-16 LAB — COMPREHENSIVE METABOLIC PANEL WITH GFR
ALT: 26 U/L (ref 0–44)
AST: 42 U/L — ABNORMAL HIGH (ref 15–41)
Albumin: 4.6 g/dL (ref 3.5–5.0)
Alkaline Phosphatase: 114 U/L (ref 38–126)
Anion gap: 17 — ABNORMAL HIGH (ref 5–15)
BUN: 11 mg/dL (ref 8–23)
CO2: 21 mmol/L — ABNORMAL LOW (ref 22–32)
Calcium: 9 mg/dL (ref 8.9–10.3)
Chloride: 91 mmol/L — ABNORMAL LOW (ref 98–111)
Creatinine, Ser: 0.78 mg/dL (ref 0.61–1.24)
GFR, Estimated: >60 mL/min
Glucose, Bld: 123 mg/dL — ABNORMAL HIGH (ref 70–99)
Potassium: 3 mmol/L — ABNORMAL LOW (ref 3.5–5.1)
Sodium: 129 mmol/L — ABNORMAL LOW (ref 135–145)
Total Bilirubin: 0.8 mg/dL (ref 0.0–1.2)
Total Protein: 7.8 g/dL (ref 6.5–8.1)

## 2024-10-16 LAB — CBC WITH DIFFERENTIAL/PLATELET
Abs Immature Granulocytes: 0.04 K/uL (ref 0.00–0.07)
Basophils Absolute: 0 K/uL (ref 0.0–0.1)
Basophils Relative: 0 %
Eosinophils Absolute: 0 K/uL (ref 0.0–0.5)
Eosinophils Relative: 0 %
HCT: 32.8 % — ABNORMAL LOW (ref 39.0–52.0)
Hemoglobin: 11.8 g/dL — ABNORMAL LOW (ref 13.0–17.0)
Immature Granulocytes: 0 %
Lymphocytes Relative: 14 %
Lymphs Abs: 1.3 K/uL (ref 0.7–4.0)
MCH: 31.2 pg (ref 26.0–34.0)
MCHC: 36 g/dL (ref 30.0–36.0)
MCV: 86.8 fL (ref 80.0–100.0)
Monocytes Absolute: 0.7 K/uL (ref 0.1–1.0)
Monocytes Relative: 7 %
Neutro Abs: 7 K/uL (ref 1.7–7.7)
Neutrophils Relative %: 79 %
Platelets: 279 K/uL (ref 150–400)
RBC: 3.78 MIL/uL — ABNORMAL LOW (ref 4.22–5.81)
RDW: 15.5 % (ref 11.5–15.5)
WBC: 9.1 K/uL (ref 4.0–10.5)
nRBC: 0 % (ref 0.0–0.2)

## 2024-10-16 LAB — PHENOBARBITAL LEVEL: Phenobarbital: 13 ug/mL — ABNORMAL LOW (ref 15.0–40.0)

## 2024-10-16 LAB — T4, FREE: Free T4: <0.25 ng/dL — ABNORMAL LOW (ref 0.80–2.00)

## 2024-10-16 LAB — PROTIME-INR
INR: 1 (ref 0.8–1.2)
Prothrombin Time: 13.2 s (ref 11.4–15.2)

## 2024-10-16 LAB — TSH: TSH: 39.2 u[IU]/mL — ABNORMAL HIGH (ref 0.350–4.500)

## 2024-10-16 LAB — CBG MONITORING, ED: Glucose-Capillary: 133 mg/dL — ABNORMAL HIGH (ref 70–99)

## 2024-10-16 LAB — ETHANOL: Alcohol, Ethyl (B): <15 mg/dL

## 2024-10-16 MED ORDER — LORAZEPAM 2 MG/ML IJ SOLN
1.0000 mg | Freq: Once | INTRAMUSCULAR | Status: AC
  Administered 2024-10-16: 1 mg via INTRAMUSCULAR
  Filled 2024-10-16: qty 1

## 2024-10-16 MED ORDER — LIDOCAINE-EPINEPHRINE (PF) 2 %-1:200000 IJ SOLN
10.0000 mL | Freq: Once | INTRAMUSCULAR | Status: AC
  Administered 2024-10-16: 10 mL via INTRADERMAL
  Filled 2024-10-16: qty 20

## 2024-10-16 MED ORDER — SODIUM CHLORIDE 0.9 % IV SOLN
200.0000 mg | Freq: Once | INTRAVENOUS | Status: AC
  Administered 2024-10-16: 200 mg via INTRAVENOUS
  Filled 2024-10-16: qty 20

## 2024-10-16 MED ORDER — SODIUM CHLORIDE 0.9 % IV SOLN
650.0000 mg | Freq: Once | INTRAVENOUS | Status: AC
  Administered 2024-10-16: 650 mg via INTRAVENOUS
  Filled 2024-10-16: qty 5

## 2024-10-16 MED ORDER — PHENOBARBITAL 32.4 MG PO TABS: 64.8000 mg | ORAL_TABLET | Freq: Once | ORAL | Status: DC

## 2024-10-16 MED ORDER — ALPRAZOLAM 0.25 MG PO TABS: 0.5000 mg | ORAL_TABLET | Freq: Once | ORAL | Status: DC

## 2024-10-16 MED ORDER — POTASSIUM CHLORIDE 20 MEQ PO PACK
40.0000 meq | PACK | Freq: Two times a day (BID) | ORAL | Status: DC
  Administered 2024-10-17 – 2024-10-19 (×4): 40 meq via ORAL
  Filled 2024-10-16 (×7): qty 2

## 2024-10-16 MED ORDER — LACOSAMIDE 50 MG PO TABS: 200.0000 mg | ORAL_TABLET | Freq: Two times a day (BID) | ORAL | Status: DC

## 2024-10-16 NOTE — H&P (Addendum)
 " History and Physical    [name] FMW:[id number] DOB: [birth year] DOA: 10/16/2024  I have briefly reviewed the patient's prior medical records in [facility]  Patient seen and examined on 10/16/2024  PCP: Rena Luke POUR, MD  Patient coming from: home  Chief Complaint: seizures  HPI: [name] is a 64 y.o. male with medical history significant of hypothyroidism, seizure HTN, HLD, DM2 who comes into the hospital after having a seizure at home and a ground-level fall and hitting his head.  Patient is slightly confused in the ED and unable to fully contribute to the story, but I spoke with his wife over the phone.  She tells me that he has run out of phenobarbital  for the past few days but still has been having his Vimpat .  She has been having difficulties getting this refilled and she tells me she could not reach neurology office.  She also tells me that he has changed his insurance and had to drop his old PCP in the process to change his primary MDs, and ran out of Synthroid  about 3 months ago and could not get it refilled as his insurance dropped his PCP.  Wife tells me that she has hit his head/face so hard onto the floor that she is surprised at he is still alive.  On my evaluation patient is slightly confused, has mittens on he is extremely upset about the mittens.  He can tell me where he is, the year, but does not know the month.  He denies any recent fever or chills.  He denies any alcohol consumption, drugs or tobacco use.  He has no chest pain, no shortness of breath, denies any abdominal discomfort, nausea or vomiting  ED Course: in the ED he is afebrile, BP 150-160s, sating well on room air, blood work showing sodium of 129 potassium 3.0.  His white count is normal. TSH is elevated at 39.2, free T4 is undetectably low.  Urinalysis is normal. CT head with generalized atrophy and microvascular disease changes, no acute intracranial findings.  He had mild right occipital scalp soft  tissue swelling.  Maxillofacial CT showed an acute displaced fracture of the neck of the mandible on the left with dislocation of the left mandibular head and an associated 1 x 1.9 cm hematoma to the right of the mandibular symphysis.  ED discussed with neurology who recommended resumption of his home AEDs.  Dr. Roark with ENT was also consulted and he will see patient in consultation in the morning.  We have been asked to admit.  Review of Systems: All systems reviewed, and apart from HPI, all negative  Past Medical History:  Diagnosis Date   Anemia    Depression    Diabetes mellitus without complication (HCC)    Emphysema of lung (HCC)    GERD (gastroesophageal reflux disease)    Hiatal hernia    Hyperlipidemia    Hypertension    Memory loss    Seizures (HCC)    Thyroid  disease     Past Surgical History:  Procedure Laterality Date   APPENDECTOMY     CRANIOTOMY Right 02/18/2024   Procedure: CRANIOTOMY HEMATOMA EVACUATION SUBDURAL;  Surgeon: Debby Dorn MATSU, MD;  Location[facility];  Service: Neurosurgery;  Laterality: Right;  RIGHT BURR HOLES ,   DENTAL SURGERY     EYE SURGERY     TONSILLECTOMY AND ADENOIDECTOMY       reports that he has quit smoking. His smoking use included e-cigarettes and  cigarettes. He has never used smokeless tobacco. He reports that he does not drink alcohol and does not use drugs.  Allergies[1]  Family History  Problem Relation Age of Onset   Stroke Mother    Heart disease Father    Thyroid  disease Father    Thyroid  disease Brother     Prior to Admission medications  Medication Sig Start Date End Date Taking? Authorizing Provider  acetaminophen  (TYLENOL ) 500 MG tablet Take 1 tablet (500 mg total) by mouth 3 (three) times daily. 02/24/24   Regalado, Belkys A, MD  albuterol  (PROVENTIL  HFA;VENTOLIN  HFA) 108 (90 BASE) MCG/ACT inhaler Inhale 2 puffs into the lungs every 4 (four) hours as needed for wheezing or shortness of breath.    [provider]  ALPRAZolam  (XANAX ) 0.5 MG tablet Take 1 tablet (0.5 mg total) by mouth 2 (two) times daily. 03/08/24   Perri DELENA Meliton Mickey., MD  Ascorbic Acid (VITAMIN C PO) Take 1 tablet by mouth daily.    [provider]  Calcium  Carb-Cholecalciferol (CALCIUM  HIGH POTENCY/VITAMIN D) 600-5 MG-MCG TABS Take 1 tablet by mouth daily. 05/16/21   [provider]  citalopram  (CELEXA ) 20 MG tablet Take 1 tablet (20 mg total) by mouth daily. 02/24/24 04/28/25  Regalado, Belkys A, MD  guaiFENesin  (ROBITUSSIN) 100 MG/5ML liquid Take 15 mLs by mouth every 6 (six) hours. 02/24/24   Regalado, Belkys A, MD  lacosamide  (VIMPAT ) 200 MG TABS tablet TAKE ONE TABLET BY MOUTH TWICE A DAY 10/15/24   Onita Duos, MD  levothyroxine  (SYNTHROID , LEVOTHROID) 150 MCG tablet Take 150 mcg by mouth daily before breakfast.  12/30/14   [provider]  metFORMIN (GLUCOPHAGE-XR) 500 MG 24 hr tablet Take 500 mg by mouth in the morning and at bedtime. 02/19/19   [provider]  metoprolol  tartrate (LOPRESSOR ) 25 MG tablet Take 2 tablets (50 mg total) by mouth 2 (two) times daily. 03/08/24   Perri DELENA Meliton Mickey., MD  Multiple Vitamin (MULTIVITAMIN WITH MINERALS) TABS tablet Take 1 tablet by mouth daily. 02/25/24   Regalado, Belkys A, MD  nicotine  (NICODERM CQ  - DOSED IN MG/24 HOURS) 21 mg/24hr patch Place 1 patch (21 mg total) onto the skin daily. 02/25/24   Regalado, Belkys A, MD  omeprazole (PRILOSEC) 20 MG capsule Take 20 mg by mouth daily.    [provider]  PHENobarbital  (LUMINAL) 64.8 MG tablet Take 1 tablet (64.8 mg total) by mouth 2 (two) times daily. 04/29/24 04/24/25  Camara, Amadou, MD  polyethylene glycol (MIRALAX  / GLYCOLAX ) 17 g packet Take 17 g by mouth daily. 02/25/24   Regalado, Belkys A, MD  Tiotropium Bromide  Monohydrate (SPIRIVA  RESPIMAT) 2.5 MCG/ACT AERS Inhale 2 puffs into the lungs daily.    [provider]    Physical Exam: Vitals:   10/16/24 1637 10/16/24 1957 10/16/24 2000 10/16/24  2245  BP:   (!) 151/94 137/72  Pulse:   100 (!) 51  Resp:   16 15  Temp: 98.8 F (37.1 C) 99.5 F (37.5 C)    TempSrc:  Axillary    SpO2:   100% 99%  Weight:      Height:       Constitutional: Slightly agitated Eyes: PERRL, lids and conjunctivae normal ENMT: Mucous membranes are moist.   Neck: normal, supple Respiratory: clear to auscultation bilaterally, no wheezing, no crackles.  Cardiovascular: Regular rate and rhythm, no murmurs / rubs / gallops. No extremity edema.  Abdomen: no tenderness, no masses palpated. Bowel sounds positive.  Musculoskeletal: no clubbing / cyanosis. Normal muscle tone.  Skin: no rashes, lesions, ulcers. No induration Neurologic: non focal   Labs on Admission: I have personally reviewed following labs and imaging studies  CBC: Recent Labs  Lab 10/16/24 1646  WBC 9.1  NEUTROABS 7.0  HGB 11.8*  HCT 32.8*  MCV 86.8  PLT 279   Basic Metabolic Panel: Recent Labs  Lab 10/16/24 1646  NA 129*  K 3.0*  CL 91*  CO2 21*  GLUCOSE 123*  BUN 11  CREATININE 0.78  CALCIUM  9.0   Liver Function Tests: Recent Labs  Lab 10/16/24 1646  AST 42*  ALT 26  ALKPHOS 114  BILITOT 0.8  PROT 7.8  ALBUMIN 4.6   Coagulation Profile: Recent Labs  Lab 10/16/24 1646  INR 1.0   BNP (last 3 results) No results for input(s): PROBNP in the last 8760 hours. CBG: Recent Labs  Lab 10/16/24 1637  GLUCAP 133*   Thyroid  Function Tests: Recent Labs    10/16/24 1646 10/16/24 1816  TSH 39.200*  --   FREET4  --  <0.25*   Urine analysis:    Component Value Date/Time   COLORURINE YELLOW 10/16/2024 2016   APPEARANCEUR CLEAR 10/16/2024 2016   LABSPEC 1.024 10/16/2024 2016   PHURINE 6.0 10/16/2024 2016   GLUCOSEU NEGATIVE 10/16/2024 2016   HGBUR NEGATIVE 10/16/2024 2016   BILIRUBINUR NEGATIVE 10/16/2024 2016   KETONESUR 5 (A) 10/16/2024 2016   PROTEINUR 100 (A) 10/16/2024 2016   UROBILINOGEN 0.2 03/03/2014 1750   NITRITE NEGATIVE 10/16/2024 2016    LEUKOCYTESUR NEGATIVE 10/16/2024 2016     Radiological Exams on Admission: DG Pelvis Portable Result Date: 10/16/2024 EXAM: 1 or 2 VIEW(S) XRAY OF THE PELVIS 10/16/2024 05:50:00 PM COMPARISON: 05/13/2024 CLINICAL HISTORY: Patient fell. FINDINGS: BONES AND JOINTS: No acute fracture. No malalignment. Osteophytosis of the superior acetabulum. Mild degenerative changes of bilateral hip joints with mild joint space narrowing. SOFT TISSUES: Unremarkable. IMPRESSION: 1. No evidence of acute traumatic injury. Electronically signed by: Elsie Gravely MD 10/16/2024 06:12 PM EST RP Workstation: [id number]   DG Chest Portable 1 View Result Date: 10/16/2024 EXAM: 1 VIEW(S) XRAY OF THE CHEST 10/16/2024 05:50:00 PM COMPARISON: 09/30/2024 CLINICAL HISTORY: Patient fell down. FINDINGS: LUNGS AND PLEURA: No focal pulmonary opacity. No pleural effusion. No pneumothorax. HEART AND MEDIASTINUM: No acute abnormality of the cardiac and mediastinal silhouettes. BONES AND SOFT TISSUES: Old left humeral neck fracture. Degenerative changes in the spine and shoulders. No acute osseous abnormality. IMPRESSION: 1. No acute cardiopulmonary abnormality. 2. Chronic healed left humeral neck fracture. Electronically signed by: Elsie Gravely MD 10/16/2024 06:11 PM EST RP Workstation: [id number]   CT Cervical Spine Wo Contrast Addendum Date: 10/16/2024 ADDENDUM REPORT: 10/16/2024 17:54 ADDENDUM: Upon further evaluation, an acute, displaced fracture deformity is seen extending through the neck of the mandible on the left. Dislocation of the left mandibular head is also noted. Electronically Signed   By: Suzen Dials M.D.   On: 10/16/2024 17:54   Result Date: 10/16/2024 CLINICAL DATA:  Status post trauma. EXAM: CT CERVICAL SPINE WITHOUT CONTRAST TECHNIQUE: Multidetector CT imaging of the cervical spine was performed without intravenous contrast. Multiplanar CT image reconstructions were also generated. RADIATION DOSE REDUCTION:  This exam was performed according to the departmental dose-optimization program which includes automated exposure control, adjustment of the mA and/or kV according to patient size and/or use of iterative reconstruction technique. COMPARISON:  September 30, 2024 FINDINGS: Alignment: Normal. Skull base and vertebrae: No acute fracture. No primary  bone lesion or focal pathologic process. Soft tissues and spinal canal: No prevertebral fluid or swelling. No visible canal hematoma. Disc levels: Mild anterior osteophyte formation is seen at the levels of C5-C6 and C6-C7. Mild multilevel intervertebral disc space narrowing is seen throughout the cervical spine. Bilateral moderate to marked severity multilevel facet joint hypertrophy is noted. Upper chest: There is evidence of paraseptal and centrilobular emphysematous lung disease. Other: None. IMPRESSION: 1. No acute fracture or subluxation in the cervical spine. 2. Moderate severity multilevel degenerative changes. 3. Emphysematous lung disease. Electronically Signed: By: Suzen Dials M.D. On: 10/16/2024 17:46   CT Head Wo Contrast Addendum Date: 10/16/2024 ADDENDUM REPORT: 10/16/2024 17:53 ADDENDUM: Upon further evaluation, acute, displaced fracture deformity is seen extending through the neck of the mandible on the left. Dislocation of the left mandibular head is also noted. Electronically Signed   By: Suzen Dials M.D.   On: 10/16/2024 17:53   Result Date: 10/16/2024 CLINICAL DATA:  Status post trauma. EXAM: CT HEAD WITHOUT CONTRAST TECHNIQUE: Contiguous axial images were obtained from the base of the skull through the vertex without intravenous contrast. RADIATION DOSE REDUCTION: This exam was performed according to the departmental dose-optimization program which includes automated exposure control, adjustment of the mA and/or kV according to patient size and/or use of iterative reconstruction technique. COMPARISON:  None Available. FINDINGS: Brain: There  is generalized cerebral atrophy with widening of the extra-axial spaces and ventricular dilatation. There are areas of decreased attenuation within the white matter tracts of the supratentorial brain, consistent with microvascular disease changes. Vascular: No hyperdense vessel or unexpected calcification. Skull: Right frontal parietal and right parieto-occipital burr holes are seen. Sinuses/Orbits: Mild sphenoid sinus mucosal thickening is noted. Other: There is mild right occipital scalp soft tissue swelling. IMPRESSION: 1. Generalized cerebral atrophy and microvascular disease changes of the supratentorial brain. 2. Mild right occipital scalp soft tissue swelling without evidence of an acute fracture or acute intracranial abnormality. 3. Mild sphenoid sinus disease. Electronically Signed: By: Suzen Dials M.D. On: 10/16/2024 17:40   CT Maxillofacial Wo Contrast Result Date: 10/16/2024 CLINICAL DATA:  Facial trauma. EXAM: CT MAXILLOFACIAL WITHOUT CONTRAST TECHNIQUE: Multidetector CT imaging of the maxillofacial structures was performed. Multiplanar CT image reconstructions were also generated. RADIATION DOSE REDUCTION: This exam was performed according to the departmental dose-optimization program which includes automated exposure control, adjustment of the mA and/or kV according to patient size and/or use of iterative reconstruction technique. COMPARISON:  None Available. FINDINGS: Osseous: An acute displaced fracture deformity is seen extending through the neck of the mandible on the left. Dislocation of the left mandibular head is also noted. Orbits: Negative. No traumatic or inflammatory finding. Sinuses: Clear. Soft tissues: Anterior para mandibular soft tissue swelling is noted bilaterally with an associated 1.0 cm x 1.9 cm hematoma seen to the right of the mandibular symphysis (axial CT image 14, CT series 7). Mild soft tissue swelling is seen along the posterior fossa on the left. Limited  intracranial: No significant or unexpected finding. IMPRESSION: 1. Acute displaced fracture of the neck of the mandible on the left with dislocation of the left mandibular head. 2. Anterior para mandibular soft tissue swelling with an associated 1.0 cm x 1.9 cm hematoma seen to the right of the mandibular symphysis. Correlation with physical examination is recommended to exclude the presence of a soft tissue mass. Electronically Signed   By: Suzen Dials M.D.   On: 10/16/2024 17:52    EKG: Independently reviewed.  Sinus rhythm  Assessment/Plan Principal problem Breakthrough seizures -this is likely in the setting of running out of phenobarbital .  He received a dose in the ED, home medications, continue Vimpat  - Closely monitor  Active problems Acute metabolic encephalopathy -likely due to #1, monitor mental status  Hypothyroidism -as he ran out of medication in the setting of insurance change and not covering his PCP.  He does not have any signs of myxedema coma, but he is hyponatremic - Give Synthroid  x 1 IV, resume oral Synthroid  in the morning  Hyponatremia-possibly due to #2.  Monitor sodium  Hypokalemia-replenish potassium and continue to monitor  DM 2, controlled -hold metformin.  Most recent A1c 6.1 in 2025  Anxiety-resume home Xanax   Depression-hold Celexa  given hyponatremia   DVT prophylaxis: SCDs  Code Status: Full code  Family Communication: Discussed with wife over the phone Bed Type: Telemetry Consults called: Neurology, ENT  Nilda Fendt, MD, PhD [facility]  Contact via [email or web address]  10/16/2024, 11:54 PM         [1]  Allergies Allergen Reactions   Levetiracetam  Other (See Comments)    Severe anxiety, agitation, hyperactivity, very negative disposition   Tegretol [Carbamazepine] Anxiety and Other (See Comments)    Increased seizures/ Induces seizures   Penicillins Other (See Comments)    Reaction not noted   Topiramate Other (See  Comments)    Reaction not noted   "

## 2024-10-16 NOTE — ED Notes (Signed)
 Attempted to rewrap pts head and pt took bandage off for the second time

## 2024-10-16 NOTE — ED Notes (Addendum)
 Pt verbally aggressive and hitting at staff

## 2024-10-16 NOTE — ED Notes (Signed)
 Pt placed in mitts due to pulling at cords

## 2024-10-16 NOTE — ED Triage Notes (Addendum)
 Pt to ED via [facility] from home c/o seizure activity witnessed by wife while at home, aprox 5 seizures in 2 days, history of the same, off seizure meds x 2 days. Pt falling with seizure activity. Abraisons noted to back of head , top of head, chin, bleeding controlled. A&O X 3 , Pt baseline.. new agitation. Last known well Wednesday. #20 R forearm. No medications given by EMS. Not on blood thinner.   C-COLLAR in place.   Last VS: 164/96, P80, rr20, cbg 113.

## 2024-10-16 NOTE — ED Provider Notes (Signed)
 "  EMERGENCY DEPARTMENT AT [facility] Provider Note   CSN: [id number] Arrival date & time: 10/16/24  1626     Patient presents with: Fall and Seizures   [name] is a 64 y.o. male With a history of subdural hematoma status postcraniotomy May 2025, seizure disorder (on Vimpat  200mg  BID, phenobarbital  64.8mg  BID), type 2 diabetes, COPD, cognitive disorder and memory loss presents to ED for evaluation of 4 falls and 8 seizures since yesterday. HPI is mostly obtained by patient's wife who manages patient medications and assists with caretaking at home. Patient is followed by Dr. Gregg with [facility] and last saw them on 04/28/24  Wife reports that patient has been out of phenobarbital  for past two days. Patient has had 8 absent seizures where he stares off and does not respond since yesterday. At baseline, patient has poor balance and requires a walker and another individual's assistance to ambulate. He fell three times prior to doctor's appointment yesterday where he hit his chin and once today on the way back from bathroom. Patient's wife called EMS as she required assistance getting patient from ground and bleeding from back of his head. Last seizure at 1600 today. Patient's wife reports that patient tends to be agitated following seizures and has been agitated since yesterday. She also notes he has been having decreased appetite, 2-3 episodes of diarrhea for past three days. Denies fevers, thinners  HPI mostly obtained by patient's wife via telephone. 2 identifiers obtained per HIPAA guidelines who provided a substantial amount of HPI    Fall  Seizures     Prior to Admission medications  Medication Sig Start Date End Date Taking? Authorizing Provider  acetaminophen  (TYLENOL ) 500 MG tablet Take 1 tablet (500 mg total) by mouth 3 (three) times daily. 02/24/24   Regalado, Belkys A, MD  albuterol  (PROVENTIL  HFA;VENTOLIN  HFA) 108 (90 BASE) MCG/ACT inhaler Inhale  2 puffs into the lungs every 4 (four) hours as needed for wheezing or shortness of breath.    [provider]  ALPRAZolam  (XANAX ) 0.5 MG tablet Take 1 tablet (0.5 mg total) by mouth 2 (two) times daily. 03/08/24   Perri DELENA Meliton Mickey., MD  Ascorbic Acid (VITAMIN C PO) Take 1 tablet by mouth daily.    [provider]  Calcium  Carb-Cholecalciferol (CALCIUM  HIGH POTENCY/VITAMIN D) 600-5 MG-MCG TABS Take 1 tablet by mouth daily. 05/16/21   [provider]  citalopram  (CELEXA ) 20 MG tablet Take 1 tablet (20 mg total) by mouth daily. 02/24/24 04/28/25  Regalado, Belkys A, MD  guaiFENesin  (ROBITUSSIN) 100 MG/5ML liquid Take 15 mLs by mouth every 6 (six) hours. 02/24/24   Regalado, Belkys A, MD  lacosamide  (VIMPAT ) 200 MG TABS tablet TAKE ONE TABLET BY MOUTH TWICE A DAY 10/15/24   Onita Duos, MD  levothyroxine  (SYNTHROID , LEVOTHROID) 150 MCG tablet Take 150 mcg by mouth daily before breakfast.  12/30/14   [provider]  metFORMIN (GLUCOPHAGE-XR) 500 MG 24 hr tablet Take 500 mg by mouth in the morning and at bedtime. 02/19/19   [provider]  metoprolol  tartrate (LOPRESSOR ) 25 MG tablet Take 2 tablets (50 mg total) by mouth 2 (two) times daily. 03/08/24   Perri DELENA Meliton Mickey., MD  Multiple Vitamin (MULTIVITAMIN WITH MINERALS) TABS tablet Take 1 tablet by mouth daily. 02/25/24   Regalado, Belkys A, MD  nicotine  (NICODERM CQ  - DOSED IN MG/24 HOURS) 21 mg/24hr patch Place 1 patch (21 mg total) onto the skin daily. 02/25/24  Regalado, Belkys A, MD  omeprazole (PRILOSEC) 20 MG capsule Take 20 mg by mouth daily.    [provider]  PHENobarbital  (LUMINAL) 64.8 MG tablet Take 1 tablet (64.8 mg total) by mouth 2 (two) times daily. 04/29/24 04/24/25  Camara, Amadou, MD  polyethylene glycol (MIRALAX  / GLYCOLAX ) 17 g packet Take 17 g by mouth daily. 02/25/24   Regalado, Belkys A, MD  Tiotropium Bromide  Monohydrate (SPIRIVA  RESPIMAT) 2.5 MCG/ACT AERS Inhale 2 puffs into the lungs  daily.    [provider]    Allergies: Levetiracetam , Tegretol [carbamazepine], Penicillins, and Topiramate    Review of Systems  Neurological:  Positive for seizures.    Updated Vital Signs BP 137/72   Pulse (!) 51   Temp 99.5 F (37.5 C) (Axillary)   Resp 15   Ht 5' 7 (1.702 m)   Wt 81.6 kg   SpO2 99%   BMI 28.19 kg/m   Physical Exam Vitals and nursing note reviewed.  Constitutional:      General: He is not in acute distress.    Appearance: Normal appearance. He is not ill-appearing.     Comments: Mildly agitated  HENT:     Head: Normocephalic. No raccoon eyes or Battle's sign.     Comments: 3cm slowly bleeding laceration to right occipital region. No pulsatile bleeding. Mild swelling and abrasions to chin.     Right Ear: No hemotympanum. Tympanic membrane is not perforated.     Left Ear: No hemotympanum. Tympanic membrane is not perforated.     Ears:     Comments: EOM intact Eyes:     General: Lids are normal. Vision grossly intact.     Conjunctiva/sclera: Conjunctivae normal.  Neck:     Comments: C collar PTA Cardiovascular:     Rate and Rhythm: Normal rate.  Pulmonary:     Effort: Pulmonary effort is normal. No respiratory distress.     Breath sounds: Normal breath sounds.  Abdominal:     Tenderness: There is no abdominal tenderness. There is no guarding or rebound.  Musculoskeletal:     Cervical back: No bony tenderness.     Thoracic back: No bony tenderness.     Lumbar back: No bony tenderness.     Right lower leg: No edema.     Left lower leg: No edema.  Skin:    Coloration: Skin is not jaundiced or pale.     Comments: Old bruise to left hip and left shoulder. No new ecchymosis to chest, abdomen, nor back.  Neurological:     Mental Status: He is alert and oriented to person, place, and time.     Comments: A&Ox3. Follows commands appropriately. No slurred speech nor visual deficits. Motor 5/5 and sensation 2/2 of BUE and BLE     (all labs  ordered are listed, but only abnormal results are displayed) Labs Reviewed  COMPREHENSIVE METABOLIC PANEL WITH GFR - Abnormal; Notable for the following components:      Result Value   Sodium 129 (*)    Potassium 3.0 (*)    Chloride 91 (*)    CO2 21 (*)    Glucose, Bld 123 (*)    AST 42 (*)    Anion gap 17 (*)    All other components within normal limits  CBC WITH DIFFERENTIAL/PLATELET - Abnormal; Notable for the following components:   RBC 3.78 (*)    Hemoglobin 11.8 (*)    HCT 32.8 (*)    All other components within  normal limits  PHENOBARBITAL  LEVEL - Abnormal; Notable for the following components:   Phenobarbital  13.0 (*)    All other components within normal limits  URINALYSIS, ROUTINE W REFLEX MICROSCOPIC - Abnormal; Notable for the following components:   Ketones, ur 5 (*)    Protein, ur 100 (*)    All other components within normal limits  TSH - Abnormal; Notable for the following components:   TSH 39.200 (*)    All other components within normal limits  T4, FREE - Abnormal; Notable for the following components:   Free T4 <0.25 (*)    All other components within normal limits  CBG MONITORING, ED - Abnormal; Notable for the following components:   Glucose-Capillary 133 (*)    All other components within normal limits  PROTIME-INR  ETHANOL  T3  LACOSAMIDE     EKG: None  Radiology: DG Pelvis Portable Result Date: 10/16/2024 EXAM: 1 or 2 VIEW(S) XRAY OF THE PELVIS 10/16/2024 05:50:00 PM COMPARISON: 05/13/2024 CLINICAL HISTORY: Patient fell. FINDINGS: BONES AND JOINTS: No acute fracture. No malalignment. Osteophytosis of the superior acetabulum. Mild degenerative changes of bilateral hip joints with mild joint space narrowing. SOFT TISSUES: Unremarkable. IMPRESSION: 1. No evidence of acute traumatic injury. Electronically signed by: Elsie Gravely MD 10/16/2024 06:12 PM EST RP Workstation: [id number]   DG Chest Portable 1 View Result Date: 10/16/2024 EXAM: 1 VIEW(S)  XRAY OF THE CHEST 10/16/2024 05:50:00 PM COMPARISON: 09/30/2024 CLINICAL HISTORY: Patient fell down. FINDINGS: LUNGS AND PLEURA: No focal pulmonary opacity. No pleural effusion. No pneumothorax. HEART AND MEDIASTINUM: No acute abnormality of the cardiac and mediastinal silhouettes. BONES AND SOFT TISSUES: Old left humeral neck fracture. Degenerative changes in the spine and shoulders. No acute osseous abnormality. IMPRESSION: 1. No acute cardiopulmonary abnormality. 2. Chronic healed left humeral neck fracture. Electronically signed by: Elsie Gravely MD 10/16/2024 06:11 PM EST RP Workstation: [id number]   CT Cervical Spine Wo Contrast Addendum Date: 10/16/2024 ADDENDUM REPORT: 10/16/2024 17:54 ADDENDUM: Upon further evaluation, an acute, displaced fracture deformity is seen extending through the neck of the mandible on the left. Dislocation of the left mandibular head is also noted. Electronically Signed   By: Suzen Dials M.D.   On: 10/16/2024 17:54   Result Date: 10/16/2024 CLINICAL DATA:  Status post trauma. EXAM: CT CERVICAL SPINE WITHOUT CONTRAST TECHNIQUE: Multidetector CT imaging of the cervical spine was performed without intravenous contrast. Multiplanar CT image reconstructions were also generated. RADIATION DOSE REDUCTION: This exam was performed according to the departmental dose-optimization program which includes automated exposure control, adjustment of the mA and/or kV according to patient size and/or use of iterative reconstruction technique. COMPARISON:  September 30, 2024 FINDINGS: Alignment: Normal. Skull base and vertebrae: No acute fracture. No primary bone lesion or focal pathologic process. Soft tissues and spinal canal: No prevertebral fluid or swelling. No visible canal hematoma. Disc levels: Mild anterior osteophyte formation is seen at the levels of C5-C6 and C6-C7. Mild multilevel intervertebral disc space narrowing is seen throughout the cervical spine. Bilateral moderate to  marked severity multilevel facet joint hypertrophy is noted. Upper chest: There is evidence of paraseptal and centrilobular emphysematous lung disease. Other: None. IMPRESSION: 1. No acute fracture or subluxation in the cervical spine. 2. Moderate severity multilevel degenerative changes. 3. Emphysematous lung disease. Electronically Signed: By: Suzen Dials M.D. On: 10/16/2024 17:46   CT Head Wo Contrast Addendum Date: 10/16/2024 ADDENDUM REPORT: 10/16/2024 17:53 ADDENDUM: Upon further evaluation, acute, displaced fracture deformity is seen extending through the neck of  the mandible on the left. Dislocation of the left mandibular head is also noted. Electronically Signed   By: Suzen Dials M.D.   On: 10/16/2024 17:53   Result Date: 10/16/2024 CLINICAL DATA:  Status post trauma. EXAM: CT HEAD WITHOUT CONTRAST TECHNIQUE: Contiguous axial images were obtained from the base of the skull through the vertex without intravenous contrast. RADIATION DOSE REDUCTION: This exam was performed according to the departmental dose-optimization program which includes automated exposure control, adjustment of the mA and/or kV according to patient size and/or use of iterative reconstruction technique. COMPARISON:  None Available. FINDINGS: Brain: There is generalized cerebral atrophy with widening of the extra-axial spaces and ventricular dilatation. There are areas of decreased attenuation within the white matter tracts of the supratentorial brain, consistent with microvascular disease changes. Vascular: No hyperdense vessel or unexpected calcification. Skull: Right frontal parietal and right parieto-occipital burr holes are seen. Sinuses/Orbits: Mild sphenoid sinus mucosal thickening is noted. Other: There is mild right occipital scalp soft tissue swelling. IMPRESSION: 1. Generalized cerebral atrophy and microvascular disease changes of the supratentorial brain. 2. Mild right occipital scalp soft tissue swelling  without evidence of an acute fracture or acute intracranial abnormality. 3. Mild sphenoid sinus disease. Electronically Signed: By: Suzen Dials M.D. On: 10/16/2024 17:40   CT Maxillofacial Wo Contrast Result Date: 10/16/2024 CLINICAL DATA:  Facial trauma. EXAM: CT MAXILLOFACIAL WITHOUT CONTRAST TECHNIQUE: Multidetector CT imaging of the maxillofacial structures was performed. Multiplanar CT image reconstructions were also generated. RADIATION DOSE REDUCTION: This exam was performed according to the departmental dose-optimization program which includes automated exposure control, adjustment of the mA and/or kV according to patient size and/or use of iterative reconstruction technique. COMPARISON:  None Available. FINDINGS: Osseous: An acute displaced fracture deformity is seen extending through the neck of the mandible on the left. Dislocation of the left mandibular head is also noted. Orbits: Negative. No traumatic or inflammatory finding. Sinuses: Clear. Soft tissues: Anterior para mandibular soft tissue swelling is noted bilaterally with an associated 1.0 cm x 1.9 cm hematoma seen to the right of the mandibular symphysis (axial CT image 14, CT series 7). Mild soft tissue swelling is seen along the posterior fossa on the left. Limited intracranial: No significant or unexpected finding. IMPRESSION: 1. Acute displaced fracture of the neck of the mandible on the left with dislocation of the left mandibular head. 2. Anterior para mandibular soft tissue swelling with an associated 1.0 cm x 1.9 cm hematoma seen to the right of the mandibular symphysis. Correlation with physical examination is recommended to exclude the presence of a soft tissue mass. Electronically Signed   By: Suzen Dials M.D.   On: 10/16/2024 17:52     Medications Ordered in the ED  potassium chloride  (KLOR-CON ) packet 40 mEq (40 mEq Oral Patient Refused/Not Given 10/16/24 2128)  lacosamide  (VIMPAT ) tablet 200 mg (has no  administration in time range)  PHENobarbital  (LUMINAL) tablet 64.8 mg (has no administration in time range)  lacosamide  (VIMPAT ) 200 mg in sodium chloride  0.9 % 25 mL IVPB (0 mg Intravenous Stopped 10/16/24 2023)  PHENObarbital  (LUMINAL) 650 mg in sodium chloride  0.9 % 100 mL IVPB (0 mg Intravenous Stopped 10/16/24 1947)  lidocaine -EPINEPHrine  (XYLOCAINE  W/EPI) 2 %-1:200000 (PF) injection 10 mL (10 mLs Intradermal Given by Other 10/16/24 1850)  LORazepam  (ATIVAN ) injection 1 mg (1 mg Intramuscular Given 10/16/24 2010)  Medical Decision Making Amount and/or Complexity of Data Reviewed Labs: ordered. Radiology: ordered.  Risk Prescription drug management. Decision regarding hospitalization.   Patient presents to the ED for concern of seizure, head injury, this involves an extensive number of treatment options, and is a complaint that carries with it a high risk of complications and morbidity.  The differential diagnosis includes ICH, SAH, subtherapeutic seizure medication level   Co morbidities that complicate the patient evaluation  Seizure history See HPI   Additional history obtained:  Additional history obtained from EMS, Family, Nursing, and Outside Medical Records   External records from outside source obtained and reviewed including EMS note, triage RN note, neurology note from 04/2024, wife via telephone   Lab Tests:  I Ordered, and personally interpreted labs.  The pertinent results include:   TSH 39.2 Sodium 129 Potassium 3 CBG 123 Phenobarbital  13 Hgb 11.8   Imaging Studies ordered:  I ordered imaging studies including CT head, cervical spine, maxillofacial, CXR, pelvis XR  I independently visualized and interpreted imaging which showed  Acute displaced fracture of the neck of the mandible on the left with dislocation of the left mandibular head. Anterior para mandibular soft tissue swelling with an associated 1.0 cm x 1.9 cm  hematoma seen to the right of the mandibular symphysis I agree with the radiologist interpretation   Cardiac Monitoring:  The patient was maintained on a cardiac monitor.  I personally viewed and interpreted the cardiac monitored which showed an underlying rhythm of: NSR with prolonged QT   Medicines ordered and prescription drug management:  I ordered medication including vinpat, phenobarbital   for seizure  Reevaluation of the patient after these medicines showed that the patient improved I have reviewed the patients home medicines and have made adjustments as needed   .Laceration Repair  Date/Time: 10/16/2024 7:29 PM  Performed by: Minnie Tinnie BRAVO, PA Authorized by: Minnie Tinnie BRAVO, PA   Consent:    Consent obtained:  Verbal   Consent given by:  Patient   Risks, benefits, and alternatives were discussed: yes     Risks discussed:  Infection, pain, poor cosmetic result and poor wound healing Universal protocol:    Procedure explained and questions answered to patient or proxy's satisfaction: yes     Patient identity confirmed:  Verbally with patient and arm band Anesthesia:    Anesthesia method:  Local infiltration   Local anesthetic:  Lidocaine  1% WITH epi Laceration details:    Location:  Scalp   Scalp location:  Occipital   Length (cm):  3 Treatment:    Area cleansed with:  Saline   Irrigation solution:  Sterile saline   Irrigation volume:  1000 Skin repair:    Repair method:  Sutures and staples   Suture size:  5-0   Suture material:  Prolene   Suture technique:  Figure eight   Number of sutures:  1   Number of staples:  4 Approximation:    Approximation:  Close Repair type:    Repair type:  Simple Post-procedure details:    Dressing:  Bulky dressing   Procedure completion:  Tolerated well, no immediate complications   Consultations Obtained:  I requested consultation with neurology Dr. Vanessa,  and discussed lab and imaging findings as well as  pertinent plan - they recommend:  Phenobarbital  10mg /kg IV loading dose and home dose tonight Vinpat 200mg  IV loading dose and home medication tonight Monitor for 6-8 hrs following last seizure time If has seizure following antiepileptics then  medicine admission for obs overnight  I requested consultation with ENT Dr. Roark,  and discussed lab and imaging findings as well as pertinent plan - they recommend:  Provided MRN via telephone and imaging will be reviewed by Dr. Roark Will consult on patient during admission  I requested consultation with family medicine service however they report that patient is not a part of our service and to consult hospitalist for admission  I requested consultation with hospitalist Dr. Trixie  and discussed lab and imaging findings as well as pertinent plan - they accept patient for admission   Problem List / ED Course:  Seizure 8 absent seizures since yesterday. No tonic clonic activity VS hemodynamically stable with no fever nor tachycardia. A&Ox3 but is confused yelling out for his wife who is not present and agitated EKG wo ischemic changes. Prolonged QT. No complaints of cp, shob CBG 123 No signs of UTI. No leukocytosis Phenobarbital  level low. Vimpat  level pending. Both obtained prior to loading doses in ED Patient has missed 2 doses of phenobarbital  as he ran out of home medication. Provided loading dose of phenobarbital  and vimpat  IV In ED Is supposed to have nighttime PO seizure meds however patient refuses No grand mal seizures while in ED. Has been having absent type seizures as reported by wife Patient continues to remain agitated which occurs following seizure and at night per wife however required soft mitts and mild chemical restraints to calm patient Patient would likely benefit from PT/OT as he has coordination/balance difficulties at baseline contributing to frequent falls. Patient is cared for by similarly aged wife at home  Head  injury Scalp laceration No arterial bleeding however required figure eight stitch to stop consistent venous bleeding Laceration repair as noted above. Patient's head wrapped with bulky dressing for continued hemostasis and to keep area clean Staples and one figure eight suture to be removed in 7-10 days CT head wo ICH. Cervical spine wo fx nor traumatic subluxation CXR and pelvis XR wo traumatic injury nor PNA  Mandibular fracture CT imaging notable for acute displaced fracture of the neck of the mandible on the left with dislocation of the left mandibular head Able to open and close jaw wo difficulty. Endorses some pain with movement.  Per wife, patient stated I broke my jaw following a fall yesterday but has been talking, moving jaw normally since. ENT to see tomorrow  Hypothyroidism Per wife, has not had his synthroid  in past 3 months No hypothermia. HR 51-100. No signs of thyroid  crisis TSH 39.2 T4 <0.25  Hypokalemia 3.0 No flat T waves May be d/t a few episodes of diarrhea over past couple days Attempted PO supplementation in ED however patient refused and continues to be agitated  Hyponatremia 129 Ethanol negative. No hx of etoh abuse UA specific gravity WNL Most recently 134 two weeks ago. Normally 135-142 over past 7 mo   Reevaluation:  After the interventions noted above, I reevaluated the patient and found that they have :improved    Dispostion:  After consideration of the diagnostic results and the patients response to treatment, I feel that the patent would benefit from admission for continued agitation, seizure observation, mandibular fracture/ENT consult, multiple falls, PT/OT.   Discussed ED workup with patient and patient wife via telephone who express understanding and agrees with plan  Final diagnoses:  Injury of head, initial encounter  Scalp laceration, initial encounter  Seizure (HCC)  Hypokalemia  Hypothyroidism, unspecified type  Closed  fracture of left side  of mandibular body, initial encounter [facility])    ED Discharge Orders     None        Minnie Tinnie BRAVO, [place] 10/16/24 2359  "

## 2024-10-17 DIAGNOSIS — E876 Hypokalemia: Secondary | ICD-10-CM | POA: Diagnosis present

## 2024-10-17 DIAGNOSIS — E785 Hyperlipidemia, unspecified: Secondary | ICD-10-CM | POA: Diagnosis present

## 2024-10-17 DIAGNOSIS — S0101XA Laceration without foreign body of scalp, initial encounter: Secondary | ICD-10-CM | POA: Diagnosis present

## 2024-10-17 DIAGNOSIS — M1990 Unspecified osteoarthritis, unspecified site: Secondary | ICD-10-CM | POA: Diagnosis present

## 2024-10-17 DIAGNOSIS — W19XXXA Unspecified fall, initial encounter: Secondary | ICD-10-CM | POA: Diagnosis not present

## 2024-10-17 DIAGNOSIS — S0269XA Fracture of mandible of other specified site, initial encounter for closed fracture: Secondary | ICD-10-CM | POA: Diagnosis present

## 2024-10-17 DIAGNOSIS — F0394 Unspecified dementia, unspecified severity, with anxiety: Secondary | ICD-10-CM | POA: Diagnosis present

## 2024-10-17 DIAGNOSIS — E871 Hypo-osmolality and hyponatremia: Secondary | ICD-10-CM | POA: Diagnosis present

## 2024-10-17 DIAGNOSIS — Y92009 Unspecified place in unspecified non-institutional (private) residence as the place of occurrence of the external cause: Secondary | ICD-10-CM | POA: Diagnosis not present

## 2024-10-17 DIAGNOSIS — E039 Hypothyroidism, unspecified: Secondary | ICD-10-CM | POA: Diagnosis present

## 2024-10-17 DIAGNOSIS — W1830XA Fall on same level, unspecified, initial encounter: Secondary | ICD-10-CM | POA: Diagnosis present

## 2024-10-17 DIAGNOSIS — Z87891 Personal history of nicotine dependence: Secondary | ICD-10-CM | POA: Diagnosis not present

## 2024-10-17 DIAGNOSIS — E119 Type 2 diabetes mellitus without complications: Secondary | ICD-10-CM | POA: Diagnosis present

## 2024-10-17 DIAGNOSIS — Z8249 Family history of ischemic heart disease and other diseases of the circulatory system: Secondary | ICD-10-CM | POA: Diagnosis not present

## 2024-10-17 DIAGNOSIS — Z79890 Hormone replacement therapy (postmenopausal): Secondary | ICD-10-CM | POA: Diagnosis not present

## 2024-10-17 DIAGNOSIS — Z7409 Other reduced mobility: Secondary | ICD-10-CM | POA: Diagnosis present

## 2024-10-17 DIAGNOSIS — G40909 Epilepsy, unspecified, not intractable, without status epilepticus: Secondary | ICD-10-CM | POA: Diagnosis present

## 2024-10-17 DIAGNOSIS — I110 Hypertensive heart disease with heart failure: Secondary | ICD-10-CM | POA: Diagnosis present

## 2024-10-17 DIAGNOSIS — F32A Depression, unspecified: Secondary | ICD-10-CM | POA: Diagnosis present

## 2024-10-17 DIAGNOSIS — R001 Bradycardia, unspecified: Secondary | ICD-10-CM | POA: Diagnosis present

## 2024-10-17 DIAGNOSIS — R569 Unspecified convulsions: Secondary | ICD-10-CM | POA: Diagnosis not present

## 2024-10-17 DIAGNOSIS — G9341 Metabolic encephalopathy: Secondary | ICD-10-CM | POA: Diagnosis present

## 2024-10-17 DIAGNOSIS — Z79899 Other long term (current) drug therapy: Secondary | ICD-10-CM | POA: Diagnosis not present

## 2024-10-17 DIAGNOSIS — K219 Gastro-esophageal reflux disease without esophagitis: Secondary | ICD-10-CM | POA: Diagnosis present

## 2024-10-17 DIAGNOSIS — Z7984 Long term (current) use of oral hypoglycemic drugs: Secondary | ICD-10-CM | POA: Diagnosis not present

## 2024-10-17 DIAGNOSIS — J439 Emphysema, unspecified: Secondary | ICD-10-CM | POA: Diagnosis present

## 2024-10-17 DIAGNOSIS — F0393 Unspecified dementia, unspecified severity, with mood disturbance: Secondary | ICD-10-CM | POA: Diagnosis present

## 2024-10-17 DIAGNOSIS — S0302XA Dislocation of jaw, left side, initial encounter: Secondary | ICD-10-CM | POA: Diagnosis present

## 2024-10-17 LAB — T3: T3, Total: <20 ng/dL — ABNORMAL LOW (ref 71–180)

## 2024-10-17 LAB — CBC
HCT: 29.7 % — ABNORMAL LOW (ref 39.0–52.0)
Hemoglobin: 10.5 g/dL — ABNORMAL LOW (ref 13.0–17.0)
MCH: 31.7 pg (ref 26.0–34.0)
MCHC: 35.4 g/dL (ref 30.0–36.0)
MCV: 89.7 fL (ref 80.0–100.0)
Platelets: 264 10*3/uL (ref 150–400)
RBC: 3.31 MIL/uL — ABNORMAL LOW (ref 4.22–5.81)
RDW: 16 % — ABNORMAL HIGH (ref 11.5–15.5)
WBC: 7.5 10*3/uL (ref 4.0–10.5)
nRBC: 0 % (ref 0.0–0.2)

## 2024-10-17 LAB — COMPREHENSIVE METABOLIC PANEL WITH GFR
ALT: 23 U/L (ref 0–44)
AST: 34 U/L (ref 15–41)
Albumin: 4.3 g/dL (ref 3.5–5.0)
Alkaline Phosphatase: 101 U/L (ref 38–126)
Anion gap: 9 (ref 5–15)
BUN: 13 mg/dL (ref 8–23)
CO2: 28 mmol/L (ref 22–32)
Calcium: 8.7 mg/dL — ABNORMAL LOW (ref 8.9–10.3)
Chloride: 93 mmol/L — ABNORMAL LOW (ref 98–111)
Creatinine, Ser: 0.84 mg/dL (ref 0.61–1.24)
GFR, Estimated: >60 mL/min
Glucose, Bld: 87 mg/dL (ref 70–99)
Potassium: 3.3 mmol/L — ABNORMAL LOW (ref 3.5–5.1)
Sodium: 130 mmol/L — ABNORMAL LOW (ref 135–145)
Total Bilirubin: 0.6 mg/dL (ref 0.0–1.2)
Total Protein: 7.2 g/dL (ref 6.5–8.1)

## 2024-10-17 LAB — CBG MONITORING, ED: Glucose-Capillary: 107 mg/dL — ABNORMAL HIGH (ref 70–99)

## 2024-10-17 LAB — CORTISOL: Cortisol, Plasma: 20.1 ug/dL

## 2024-10-17 MED ORDER — QUETIAPINE FUMARATE 25 MG PO TABS
25.0000 mg | ORAL_TABLET | Freq: Every evening | ORAL | Status: DC | PRN
  Administered 2024-10-17: 25 mg via ORAL
  Filled 2024-10-17: qty 1

## 2024-10-17 MED ORDER — LORAZEPAM 2 MG/ML IJ SOLN
1.0000 mg | Freq: Once | INTRAMUSCULAR | Status: AC
  Administered 2024-10-17: 1 mg via INTRAVENOUS
  Filled 2024-10-17: qty 1

## 2024-10-17 MED ORDER — LACOSAMIDE 200 MG PO TABS
200.0000 mg | ORAL_TABLET | Freq: Two times a day (BID) | ORAL | Status: DC
  Administered 2024-10-17 – 2024-10-20 (×8): 200 mg via ORAL
  Filled 2024-10-17 (×5): qty 1
  Filled 2024-10-17: qty 4
  Filled 2024-10-17 (×2): qty 1

## 2024-10-17 MED ORDER — ACETAMINOPHEN 650 MG RE SUPP: 650.0000 mg | Freq: Four times a day (QID) | RECTAL | Status: DC | PRN

## 2024-10-17 MED ORDER — LEVOTHYROXINE SODIUM 75 MCG PO TABS
150.0000 ug | ORAL_TABLET | Freq: Every day | ORAL | Status: DC
  Administered 2024-10-18 – 2024-10-20 (×3): 150 ug via ORAL
  Filled 2024-10-17 (×4): qty 2

## 2024-10-17 MED ORDER — ALPRAZOLAM 0.25 MG PO TABS
0.5000 mg | ORAL_TABLET | Freq: Two times a day (BID) | ORAL | Status: DC
  Administered 2024-10-17 – 2024-10-18 (×4): 0.5 mg via ORAL
  Filled 2024-10-17 (×4): qty 2

## 2024-10-17 MED ORDER — PHENOBARBITAL 32.4 MG PO TABS
64.8000 mg | ORAL_TABLET | Freq: Two times a day (BID) | ORAL | Status: DC
  Administered 2024-10-17 – 2024-10-20 (×8): 64.8 mg via ORAL
  Filled 2024-10-17 (×8): qty 2

## 2024-10-17 MED ORDER — OLANZAPINE 10 MG IM SOLR
2.5000 mg | Freq: Once | INTRAMUSCULAR | Status: AC
  Administered 2024-10-17: 2.5 mg via INTRAVENOUS
  Filled 2024-10-17: qty 10

## 2024-10-17 MED ORDER — HALOPERIDOL LACTATE 5 MG/ML IJ SOLN: 2.0000 mg | Freq: Four times a day (QID) | INTRAMUSCULAR | Status: DC | PRN

## 2024-10-17 MED ORDER — ALBUTEROL SULFATE (2.5 MG/3ML) 0.083% IN NEBU: 2.5000 mg | INHALATION_SOLUTION | RESPIRATORY_TRACT | Status: DC | PRN

## 2024-10-17 MED ORDER — ALBUTEROL SULFATE HFA 108 (90 BASE) MCG/ACT IN AERS: 2.0000 | INHALATION_SPRAY | RESPIRATORY_TRACT | Status: DC | PRN

## 2024-10-17 MED ORDER — LORAZEPAM 2 MG/ML IJ SOLN
1.0000 mg | INTRAMUSCULAR | Status: DC | PRN
  Administered 2024-10-17 – 2024-10-19 (×5): 1 mg via INTRAVENOUS
  Filled 2024-10-17 (×5): qty 1

## 2024-10-17 MED ORDER — LEVOTHYROXINE SODIUM 100 MCG/5ML IV SOLN
50.0000 ug | Freq: Once | INTRAVENOUS | Status: AC
  Administered 2024-10-17: 50 ug via INTRAVENOUS
  Filled 2024-10-17: qty 5

## 2024-10-17 MED ORDER — METOPROLOL TARTRATE 25 MG PO TABS
25.0000 mg | ORAL_TABLET | Freq: Two times a day (BID) | ORAL | Status: DC
  Administered 2024-10-17: 25 mg via ORAL
  Filled 2024-10-17 (×2): qty 1

## 2024-10-17 MED ORDER — ACETAMINOPHEN 325 MG PO TABS
650.0000 mg | ORAL_TABLET | Freq: Four times a day (QID) | ORAL | Status: DC | PRN
  Administered 2024-10-17 – 2024-10-18 (×3): 650 mg via ORAL
  Filled 2024-10-17 (×3): qty 2

## 2024-10-17 MED ORDER — STERILE WATER FOR INJECTION IJ SOLN
INTRAMUSCULAR | Status: AC
  Administered 2024-10-17: 10 mL
  Filled 2024-10-17: qty 10

## 2024-10-17 MED ORDER — ONDANSETRON HCL 4 MG/2ML IJ SOLN: 4.0000 mg | Freq: Four times a day (QID) | INTRAMUSCULAR | Status: DC | PRN

## 2024-10-17 MED ORDER — ONDANSETRON HCL 4 MG PO TABS: 4.0000 mg | ORAL_TABLET | Freq: Four times a day (QID) | ORAL | Status: DC | PRN

## 2024-10-17 MED ORDER — UMECLIDINIUM BROMIDE 62.5 MCG/ACT IN AEPB
1.0000 | INHALATION_SPRAY | Freq: Every day | RESPIRATORY_TRACT | Status: DC
  Administered 2024-10-17 – 2024-10-20 (×4): 1 via RESPIRATORY_TRACT
  Filled 2024-10-17 (×2): qty 7

## 2024-10-17 MED ORDER — SODIUM CHLORIDE 0.9 % IV SOLN: INTRAVENOUS | Status: DC

## 2024-10-17 MED ORDER — HALOPERIDOL LACTATE 5 MG/ML IJ SOLN
5.0000 mg | Freq: Once | INTRAMUSCULAR | Status: AC
  Administered 2024-10-17: 5 mg via INTRAMUSCULAR
  Filled 2024-10-17: qty 1

## 2024-10-17 MED ORDER — MELATONIN 3 MG PO TABS
3.0000 mg | ORAL_TABLET | Freq: Every day | ORAL | Status: DC
  Administered 2024-10-17 – 2024-10-20 (×4): 3 mg via ORAL
  Filled 2024-10-17 (×4): qty 1

## 2024-10-17 MED ORDER — HALOPERIDOL LACTATE 5 MG/ML IJ SOLN: 5.0000 mg | Freq: Once | INTRAMUSCULAR | Status: DC

## 2024-10-17 NOTE — ED Notes (Signed)
 Attending provider notified pt is being combative, refusing PO medications, pulling out IV. New orders placed.

## 2024-10-17 NOTE — ED Notes (Signed)
 Pt combative, swinging at this RN while verbally threatening to strike her with his right hand. Pt A&O x4 but keeps repeating to call his wife, talking about president Trump being a huge homosexual & his pillow is made of rocks.

## 2024-10-17 NOTE — Progress Notes (Signed)
 " PROGRESS NOTE  [name]  DOB: December 07, 1960  PCP: Rena Luke POUR, MD FMW:[id number]  DOA: 10/16/2024  LOS: 0 days  Hospital Day: 2  Subjective: Patient was seen and examined this morning. I was in communication with the nurse earlier this morning.  Patient was restless, agitated not responding to Ativan .  1 dose of Haldol  5 mg was given after which patient got some rest. At the time of my evaluation, patient was somnolent, tried to present command.  Family not at bedside. Remains afebrile, hemodynamically stable  Brief narrative: Joel Cowin is a 64 y.o. male with PMH significant for DM2, HTN, HLD, COPD, GERD, CHF, hypothyroidism. Patient lives at home, significantly limited in mobility, sometimes uses walker.  Wife has noticed worsening dementia..  1/23, patient was brought to the ED from home after seizure activity leading to ground-level fall. Seizure was witnessed by wife. Wife reported that he fell hard on the floor face forward Patient apparently ran out of phenobarbital  few days ago but was still taking Vimpat .   Since patient changed insurance this year, he was dropped by his old PCP and hence not been able to refill phenobarbital  for the last few days.  Wife tried but was not able to get in touch with neurology office.  Also reports he ran out of Synthroid  3 months ago.    In the ED, patient was afebrile, blood pressure 160s, breathing on room air Labs with WBC count 9.1, hemoglobin 11.8, sodium 129, potassium 3, phenobarbital  level was low at 13, TSH elevated to 39 with free T4 and T3 significantly low. Urinalysis unremarkable  CT head did not show any acute intracranial findings, showed generalized atrophy and microvascular disease changes. He had mild right occipital scalp soft tissue swelling.   Maxillofacial CT showed an acute displaced fracture of the neck of the mandible on the left with dislocation of the left mandibular head and an associated 1 x 1.9 cm hematoma to  the right of the mandibular symphysis.   EDP discussed with neurology who recommended resumption of his home AEDs.   Dr. Roark with ENT was also consulted Admitted to [facility]  Assessment and plan: Breakthrough seizures  H/o seizure disorder Supposed to be on Vimpat  and phenobarbital  at home but apparently ran out of phenobarbital  for few days.  Presented with breakthrough seizure Phenobarbital  level subtherapeutic  Vimpat  and phenobarbital  have both been resumed  Acute metabolic encephalopathy  Underlying dementia At baseline, patient seems to be having progressive dementia.  Wife states that sometimes he gets bad sundowning.  He gets restless and verbally abusive. In the ED, he was restless, agitated and required IV Ativan , IV Haldol . I will order for melatonin 3 mg nightly and Seroquel  25 mg as needed  Acute mandibular fracture Secondary to fall after seizure Maxillofacial CT showed an acute displaced fracture of the neck of the mandible on the left with dislocation of the left mandibular head and an associated 1 x 1.9 cm hematoma to the right of the mandibular symphysis.  ENT Dr. Roark was consulted.  Per his note, since patient does not have any teeth and does not wear dentures, he is not in MMF.  He can stay on soft diet and follow-up as an outpatient.  Severe hypothyroidism Apparently ran out of Synthroid  few months ago. TSH elevated to 39 with free T4 and T3 significantly low. No signs of myxedema coma except for hyponatremia 1 dose of IV Synthroid  was given Ordered for Synthroid  150 mcg daily  Obtain cortisol level Recent Labs    01/30/24 2032 02/03/24 0445 05/13/24 0841 10/16/24 1646  TSH 48.029* 30.797* 22.872* 39.200*    Hyponatremia Hypokalemia Hyponatremia likely due to severe hypothyroidism Potassium replacement given Continue monitor electrolytes Recent Labs  Lab 10/16/24 1646 10/17/24 0439  NA 129* 130*  K 3.0* 3.3*  CL 91* 93*  CO2 21* 28  GLUCOSE 123*  87  BUN 11 13  CREATININE 0.78 0.84  CALCIUM  9.0 8.7*   Type 2 diabetes mellitus A1c 6.1 in 01/31/2024.  Repeat A1c PTA meds- metformin Currently on SSI/Accu-Cheks Recent Labs  Lab 10/16/24 1637  GLUCAP 133*   HTN On metoprolol  25 mg twice daily  HLD  COPD Continue bronchodilators  GERD  Anxiety/depression Celexa , Xanax      Nutrition Status:         Mobility:   PT Orders: Active   PT Follow up Rec:      Goals of care   Code Status: Full Code     DVT prophylaxis:  SCDs Start: 10/17/24 0027   Antimicrobials: None Fluid: Start NS at 75 mL/h Consultants: ENT Family Communication: Called and discussed with patient's wife Mrs. Hazel Wrinkle this afternoon.  Status: Observation Level of care:  Telemetry   Patient is from: Home Needs to continue in-hospital care: Ongoing workup and management Anticipated d/c to: Pending clinical course, pending PT eval      Diet:  Diet Order             DIET DYS 3 Room service appropriate? Yes; Fluid consistency: Thin  Diet effective now                   Scheduled Meds:  ALPRAZolam   0.5 mg Oral BID   lacosamide   200 mg Oral BID   levothyroxine   150 mcg Oral QAC breakfast   melatonin  3 mg Oral QHS   metoprolol  tartrate  25 mg Oral BID   PHENobarbital   64.8 mg Oral BID   potassium chloride   40 mEq Oral BID   umeclidinium bromide   1 puff Inhalation Daily    PRN meds: acetaminophen  **OR** acetaminophen , albuterol , LORazepam , ondansetron  **OR** ondansetron  (ZOFRAN ) IV, QUEtiapine    Infusions:   sodium chloride  75 mL/hr at 10/17/24 1351    Antimicrobials: Anti-infectives (From admission, onward)    None       Objective: Vitals:   10/17/24 1407 10/17/24 1414  BP: (!) 154/70   Pulse: (!) 58   Resp: 14   Temp:  98.3 F (36.8 C)  SpO2: 100%    No intake or output data in the 24 hours ending 10/17/24 1449 Filed Weights   10/16/24 1632  Weight: 81.6 kg   Weight change:  Body mass  index is 28.19 kg/m.   Physical Exam: General exam: Pleasant, elderly Caucasian male. Skin: No rashes, lesions or ulcers. HEENT: Atraumatic, normocephalic, no obvious bleeding Lungs: Clear to auscultation bilaterally,  CVS: S1, S2, no murmur,   GI/Abd: Soft, nontender, nondistended, bowel sound present,   CNS: Tries to open eyes on command.  Sedated after Haldol  was given earlier Extremities: No pedal edema, no calf tenderness,   Data Review: I have personally reviewed the laboratory data and studies available.  F/u labs ordered Unresulted Labs (From admission, onward)     Start     Ordered   10/17/24 1208  Cortisol, Random  Add-on,   AD        10/17/24 1207   10/16/24 1801  Lacosamide   ONCE - STAT,   URGENT        10/16/24 1800            Signed, Chapman Rota, MD [facility] 10/17/2024  "

## 2024-10-17 NOTE — Evaluation (Signed)
 Physical Therapy Evaluation Patient Details Name: [name] MRN: [id number] DOB: 1961-05-24 Today's Date: 10/17/2024  History of Present Illness  64 y.o. male presents to [facility] hospital on 10/16/2024 with seizure-like activity resulting in a fall. CT scan with findings of acute displaced fx of the neck of the mandible on the left with dislocation of the L mandibular head and hematoma. PMH includes DMI, HTN, HLD, COPD, GERD, CHF, hypothyroidism.  Clinical Impression  Pt presents to PT with deficits in functional mobility, gait, balance, endurance. PT evaluation is limited as the pt refuses attempts at standing or ambulating due to a reported fear of falling. Pt does mobilize in bed well and laterally scoots at the edge of bed without physical assistance. PT will follow up for further assessment of mobility during admission however due to reports of significant mobility challenges at home PT initially recommends short term inpatient PT services at the time of discharge. Pt does not appear agreeable to this suggestion currently.        If plan is discharge home, recommend the following: A lot of help with walking and/or transfers;A lot of help with bathing/dressing/bathroom;Assistance with cooking/housework;Direct supervision/assist for medications management;Assist for transportation;Direct supervision/assist for financial management;Help with stairs or ramp for entrance;Supervision due to cognitive status   Can travel by private vehicle   No    Equipment Recommendations Wheelchair (measurements PT);BSC/3in1;Wheelchair cushion (measurements PT)  Recommendations for Other Services       Functional Status Assessment Patient has had a recent decline in their functional status and demonstrates the ability to make significant improvements in function in a reasonable and predictable amount of time.     Precautions / Restrictions Precautions Precautions: Fall Recall of Precautions/Restrictions:  Impaired Precaution/Restrictions Comments: siezure Restrictions Weight Bearing Restrictions Per Provider Order: No      Mobility  Bed Mobility Overal bed mobility: Needs Assistance Bed Mobility: Supine to Sit, Sit to Supine     Supine to sit: Supervision Sit to supine: Supervision        Transfers Overall transfer level: Needs assistance Equipment used: None               General transfer comment: pt refuses attempts at standing due to reported fear of falling, does laterally scoot at the edge of bed side to side ~2' each direction    Ambulation/Gait                  Stairs            Wheelchair Mobility     Tilt Bed    Modified Rankin (Stroke Patients Only)       Balance Overall balance assessment: Needs assistance Sitting-balance support: No upper extremity supported, Feet supported Sitting balance-Leahy Scale: Good                                       Pertinent Vitals/Pain Pain Assessment Pain Assessment: Faces Faces Pain Scale: Hurts even more Pain Location: jaw, head Pain Descriptors / Indicators: Aching Pain Intervention(s): Monitored during session    Home Living Family/patient expects to be discharged to:: Private residence Living Arrangements: Spouse/significant other Available Help at Discharge: Family;Available 24 hours/day Type of Home: House Home Access: Stairs to enter Entrance Stairs-Rails: Doctor, General Practice of Steps: 8   Home Layout: One level Home Equipment: Agricultural [employer] (2 wheels);[place] - single point      Prior Function  Prior Level of Function : Needs assist             Mobility Comments: pt ambulates with a RW, reports requiring assistance to ambulate, history of falls ADLs Comments: assistance for all ADLs, bathes at sink     Extremity/Trunk Assessment   Upper Extremity Assessment Upper Extremity Assessment: Generalized weakness    Lower Extremity  Assessment Lower Extremity Assessment: Generalized weakness    Cervical / Trunk Assessment Cervical / Trunk Assessment: Kyphotic  Communication   Communication Communication: Impaired Factors Affecting Communication: Reduced clarity of speech    Cognition Arousal: Alert Behavior During Therapy: WFL for tasks assessed/performed   PT - Cognitive impairments: No family/caregiver present to determine baseline                       PT - Cognition Comments: alert, oriented to person, place, situation Following commands: Intact       Cueing Cueing Techniques: Verbal cues     General Comments General comments (skin integrity, edema, etc.): pt in NAD    Exercises     Assessment/Plan    PT Assessment Patient needs continued PT services  PT Problem List Decreased strength;Decreased activity tolerance;Decreased balance;Decreased mobility;Decreased knowledge of use of DME;Decreased safety awareness;Decreased knowledge of precautions       PT Treatment Interventions DME instruction;Gait training;Functional mobility training;Stair training;Therapeutic activities;Therapeutic exercise;Balance training;Neuromuscular re-education;Cognitive remediation;Patient/family education;Wheelchair mobility training    PT Goals (Current goals can be found in the Care Plan section)  Acute Rehab PT Goals Patient Stated Goal: to return home PT Goal Formulation: With patient Time For Goal Achievement: 10/31/24 Potential to Achieve Goals: Fair    Frequency Min 2X/week     Co-evaluation               AM-PAC PT 6 Clicks Mobility  Outcome Measure Help needed turning from your back to your side while in a flat bed without using bedrails?: A Little Help needed moving from lying on your back to sitting on the side of a flat bed without using bedrails?: A Little Help needed moving to and from a bed to a chair (including a wheelchair)?: A Lot Help needed standing up from a chair using  your arms (e.g., wheelchair or bedside chair)?: A Lot Help needed to walk in hospital room?: Total Help needed climbing 3-5 steps with a railing? : Total 6 Click Score: 12    End of Session   Activity Tolerance: Other (comment) (pt refuses to stand or attempt ambulation due to fear of falling) Patient left: in bed;with call bell/phone within reach;with bed alarm set Nurse Communication: Mobility status PT Visit Diagnosis: Other abnormalities of gait and mobility (R26.89);Muscle weakness (generalized) (M62.81);Pain    Time: 1550-1609 PT Time Calculation (min) (ACUTE ONLY): 19 min   Charges:   PT Evaluation $PT Eval Low Complexity: 1 Low   PT General Charges $$ ACUTE PT VISIT: 1 Visit         Bernardino JINNY Ruth, PT, DPT Acute Rehabilitation Office [phone]   Bernardino JINNY Ruth 10/17/2024, 5:36 PM

## 2024-10-17 NOTE — ED Notes (Signed)
 Pillow provided & warm blanket

## 2024-10-17 NOTE — Consult Note (Signed)
 Reason for Consult: Mandible fracture Referring Physician: Dr. Scarlett Bare Dunivan is an 65 y.o. male.  HPI: Patient with a fall that resulted in a injury to the left condyle of the mandible.  The patient seems to build up open his mouth well prior to being sedated.  At this point he is sedated to nonarousable.  Patient is edentulous.  He does not have dentures.  He does not seem to have any other injuries to his face or neck.  Past Medical History:  Diagnosis Date   Anemia    Depression    Diabetes mellitus without complication (HCC)    Emphysema of lung (HCC)    GERD (gastroesophageal reflux disease)    Hiatal hernia    Hyperlipidemia    Hypertension    Memory loss    Seizures (HCC)    Thyroid  disease     Past Surgical History:  Procedure Laterality Date   APPENDECTOMY     CRANIOTOMY Right 02/18/2024   Procedure: CRANIOTOMY HEMATOMA EVACUATION SUBDURAL;  Surgeon: Debby Dorn MATSU, MD;  Location: [facility] OR;  Service: Neurosurgery;  Laterality: Right;  RIGHT BURR HOLES ,   DENTAL SURGERY     EYE SURGERY     TONSILLECTOMY AND ADENOIDECTOMY      Family History  Problem Relation Age of Onset   Stroke Mother    Heart disease Father    Thyroid  disease Father    Thyroid  disease Brother     Social History:  reports that he has quit smoking. His smoking use included e-cigarettes and cigarettes. He has never used smokeless tobacco. He reports that he does not drink alcohol and does not use drugs.  Allergies: Allergies[1]   Results for orders placed or performed during the hospital encounter of 10/16/24 (from the past 48 hours)  CBG monitoring, ED     Status: Abnormal   Collection Time: 10/16/24  4:37 PM  Result Value Ref Range   Glucose-Capillary 133 (H) 70 - 99 mg/dL    Comment: Glucose reference range applies only to samples taken after fasting for at least 8 hours.  Comprehensive metabolic panel     Status: Abnormal   Collection Time: 10/16/24  4:46 PM  Result Value Ref  Range   Sodium 129 (L) 135 - 145 mmol/L   Potassium 3.0 (L) 3.5 - 5.1 mmol/L    Comment: HEMOLYSIS AT THIS LEVEL MAY AFFECT RESULT   Chloride 91 (L) 98 - 111 mmol/L   CO2 21 (L) 22 - 32 mmol/L   Glucose, Bld 123 (H) 70 - 99 mg/dL    Comment: Glucose reference range applies only to samples taken after fasting for at least 8 hours.   BUN 11 8 - 23 mg/dL   Creatinine, Ser 9.21 0.61 - 1.24 mg/dL   Calcium  9.0 8.9 - 10.3 mg/dL   Total Protein 7.8 6.5 - 8.1 g/dL   Albumin 4.6 3.5 - 5.0 g/dL   AST 42 (H) 15 - 41 U/L    Comment: HEMOLYSIS AT THIS LEVEL MAY AFFECT RESULT   ALT 26 0 - 44 U/L   Alkaline Phosphatase 114 38 - 126 U/L   Total Bilirubin 0.8 0.0 - 1.2 mg/dL   GFR, Estimated >39 >39 mL/min    Comment: (NOTE) Calculated using the CKD-EPI Creatinine Equation (2021)    Anion gap 17 (H) 5 - 15    Comment: Performed at [facility], [address] [place], [place] [address]  CBC with Differential/Platelet  Status: Abnormal   Collection Time: 10/16/24  4:46 PM  Result Value Ref Range   WBC 9.1 4.0 - 10.5 K/uL   RBC 3.78 (L) 4.22 - 5.81 MIL/uL   Hemoglobin 11.8 (L) 13.0 - 17.0 g/dL   HCT 67.1 (L) 60.9 - 47.9 %   MCV 86.8 80.0 - 100.0 fL   MCH 31.2 26.0 - 34.0 pg   MCHC 36.0 30.0 - 36.0 g/dL   RDW 84.4 88.4 - 84.4 %   Platelets 279 150 - 400 K/uL   nRBC 0.0 0.0 - 0.2 %   Neutrophils Relative % 79 %   Neutro Abs 7.0 1.7 - 7.7 K/uL   Lymphocytes Relative 14 %   Lymphs Abs 1.3 0.7 - 4.0 K/uL   Monocytes Relative 7 %   Monocytes Absolute 0.7 0.1 - 1.0 K/uL   Eosinophils Relative 0 %   Eosinophils Absolute 0.0 0.0 - 0.5 K/uL   Basophils Relative 0 %   Basophils Absolute 0.0 0.0 - 0.1 K/uL   Immature Granulocytes 0 %   Abs Immature Granulocytes 0.04 0.00 - 0.07 K/uL    Comment: Performed at [facility], [address], [place], [place] [address]  Protime-INR     Status: None   Collection Time: 10/16/24  4:46 PM  Result Value Ref Range   Prothrombin Time 13.2  11.4 - 15.2 seconds   INR 1.0 0.8 - 1.2    Comment: (NOTE) INR goal varies based on device and disease states. Performed at [facility], [address] [place], [place] [address]   Ethanol     Status: None   Collection Time: 10/16/24  4:46 PM  Result Value Ref Range   Alcohol, Ethyl (B) <15 <15 mg/dL    Comment: (NOTE) For medical purposes only. Performed at [facility], [address], [place], [place] [address]   Phenobarbital  level     Status: Abnormal   Collection Time: 10/16/24  4:46 PM  Result Value Ref Range   Phenobarbital  13.0 (L) 15.0 - 40.0 ug/mL    Comment: Performed at [facility], [address] [place], [place] [address]  TSH     Status: Abnormal   Collection Time: 10/16/24  4:46 PM  Result Value Ref Range   TSH 39.200 (H) 0.350 - 4.500 uIU/mL    Comment: Performed at [facility], [address], [place], [place] [address]  T3     Status: Abnormal   Collection Time: 10/16/24  6:16 PM  Result Value Ref Range   T3, Total <20 (L) 71 - 180 ng/dL    Comment: (NOTE) Performed At: [facility] [address] [place], [place] [id number] Jennette Shorter MD Ey:[id number]   T4, free     Status: Abnormal   Collection Time: 10/16/24  6:16 PM  Result Value Ref Range   Free T4 <0.25 (L) 0.80 - 2.00 ng/dL    Comment: Performed at [facility], [address], [place], [place] [address]  Urinalysis, Routine w reflex microscopic -Urine, Clean Catch     Status: Abnormal   Collection Time: 10/16/24  8:16 PM  Result Value Ref Range   Color, Urine YELLOW YELLOW   APPearance CLEAR CLEAR   Specific Gravity, Urine 1.024 1.005 - 1.030   pH 6.0 5.0 - 8.0   Glucose, UA NEGATIVE NEGATIVE mg/dL   Hgb urine dipstick NEGATIVE NEGATIVE   Bilirubin Urine NEGATIVE NEGATIVE   Ketones, ur  5 (A) NEGATIVE mg/dL   Protein, ur 899 (A) NEGATIVE mg/dL   Nitrite NEGATIVE NEGATIVE   Leukocytes,Ua NEGATIVE NEGATIVE   RBC / HPF 0-5 0 - 5 RBC/hpf    WBC, UA 0-5 0 - 5 WBC/hpf   Bacteria, UA NONE SEEN NONE SEEN   Squamous Epithelial / HPF 0-5 0 - 5 /HPF   Mucus PRESENT     Comment: Performed at [facility], [address], [place], [place] [address]  Comprehensive metabolic panel     Status: Abnormal   Collection Time: 10/17/24  4:39 AM  Result Value Ref Range   Sodium 130 (L) 135 - 145 mmol/L   Potassium 3.3 (L) 3.5 - 5.1 mmol/L   Chloride 93 (L) 98 - 111 mmol/L   CO2 28 22 - 32 mmol/L   Glucose, Bld 87 70 - 99 mg/dL    Comment: Glucose reference range applies only to samples taken after fasting for at least 8 hours.   BUN 13 8 - 23 mg/dL   Creatinine, Ser 9.15 0.61 - 1.24 mg/dL   Calcium  8.7 (L) 8.9 - 10.3 mg/dL   Total Protein 7.2 6.5 - 8.1 g/dL   Albumin 4.3 3.5 - 5.0 g/dL   AST 34 15 - 41 U/L   ALT 23 0 - 44 U/L   Alkaline Phosphatase 101 38 - 126 U/L   Total Bilirubin 0.6 0.0 - 1.2 mg/dL   GFR, Estimated >39 >39 mL/min    Comment: (NOTE) Calculated using the CKD-EPI Creatinine Equation (2021)    Anion gap 9 5 - 15    Comment: Performed at [facility], [address] [place], [place] [address]  CBC     Status: Abnormal   Collection Time: 10/17/24  4:39 AM  Result Value Ref Range   WBC 7.5 4.0 - 10.5 K/uL   RBC 3.31 (L) 4.22 - 5.81 MIL/uL   Hemoglobin 10.5 (L) 13.0 - 17.0 g/dL   HCT 70.2 (L) 60.9 - 47.9 %   MCV 89.7 80.0 - 100.0 fL   MCH 31.7 26.0 - 34.0 pg   MCHC 35.4 30.0 - 36.0 g/dL   RDW 83.9 (H) 88.4 - 84.4 %   Platelets 264 150 - 400 K/uL   nRBC 0.0 0.0 - 0.2 %    Comment: Performed at [facility], [address] [place], [place] [address]    DG Pelvis Portable Result Date: 10/16/2024 EXAM: 1 or 2 VIEW(S) XRAY OF THE PELVIS 10/16/2024 05:50:00 PM COMPARISON: 05/13/2024 CLINICAL HISTORY: Patient fell. FINDINGS: BONES AND JOINTS: No acute fracture. No malalignment. Osteophytosis of the superior acetabulum. Mild degenerative changes of bilateral hip joints with mild joint space narrowing.  SOFT TISSUES: Unremarkable. IMPRESSION: 1. No evidence of acute traumatic injury. Electronically signed by: Elsie Gravely MD 10/16/2024 06:12 PM EST RP Workstation: [id number]   DG Chest Portable 1 View Result Date: 10/16/2024 EXAM: 1 VIEW(S) XRAY OF THE CHEST 10/16/2024 05:50:00 PM COMPARISON: 09/30/2024 CLINICAL HISTORY: Patient fell down. FINDINGS: LUNGS AND PLEURA: No focal pulmonary opacity. No pleural effusion. No pneumothorax. HEART AND MEDIASTINUM: No acute abnormality of the cardiac and mediastinal silhouettes. BONES AND SOFT TISSUES: Old left humeral neck fracture. Degenerative changes in the spine and shoulders. No acute osseous abnormality. IMPRESSION: 1. No acute cardiopulmonary abnormality. 2. Chronic healed left humeral neck fracture. Electronically signed by: Elsie Gravely MD 10/16/2024 06:11 PM EST RP Workstation: [id number]   CT Cervical Spine Wo Contrast Addendum Date: 10/16/2024 ADDENDUM  REPORT: 10/16/2024 17:54 ADDENDUM: Upon further evaluation, an acute, displaced fracture deformity is seen extending through the neck of the mandible on the left. Dislocation of the left mandibular head is also noted. Electronically Signed   By: Suzen Dials M.D.   On: 10/16/2024 17:54   Result Date: 10/16/2024 CLINICAL DATA:  Status post trauma. EXAM: CT CERVICAL SPINE WITHOUT CONTRAST TECHNIQUE: Multidetector CT imaging of the cervical spine was performed without intravenous contrast. Multiplanar CT image reconstructions were also generated. RADIATION DOSE REDUCTION: This exam was performed according to the departmental dose-optimization program which includes automated exposure control, adjustment of the mA and/or kV according to patient size and/or use of iterative reconstruction technique. COMPARISON:  September 30, 2024 FINDINGS: Alignment: Normal. Skull base and vertebrae: No acute fracture. No primary bone lesion or focal pathologic process. Soft tissues and spinal canal: No prevertebral  fluid or swelling. No visible canal hematoma. Disc levels: Mild anterior osteophyte formation is seen at the levels of C5-C6 and C6-C7. Mild multilevel intervertebral disc space narrowing is seen throughout the cervical spine. Bilateral moderate to marked severity multilevel facet joint hypertrophy is noted. Upper chest: There is evidence of paraseptal and centrilobular emphysematous lung disease. Other: None. IMPRESSION: 1. No acute fracture or subluxation in the cervical spine. 2. Moderate severity multilevel degenerative changes. 3. Emphysematous lung disease. Electronically Signed: By: Suzen Dials M.D. On: 10/16/2024 17:46   CT Head Wo Contrast Addendum Date: 10/16/2024 ADDENDUM REPORT: 10/16/2024 17:53 ADDENDUM: Upon further evaluation, acute, displaced fracture deformity is seen extending through the neck of the mandible on the left. Dislocation of the left mandibular head is also noted. Electronically Signed   By: Suzen Dials M.D.   On: 10/16/2024 17:53   Result Date: 10/16/2024 CLINICAL DATA:  Status post trauma. EXAM: CT HEAD WITHOUT CONTRAST TECHNIQUE: Contiguous axial images were obtained from the base of the skull through the vertex without intravenous contrast. RADIATION DOSE REDUCTION: This exam was performed according to the departmental dose-optimization program which includes automated exposure control, adjustment of the mA and/or kV according to patient size and/or use of iterative reconstruction technique. COMPARISON:  None Available. FINDINGS: Brain: There is generalized cerebral atrophy with widening of the extra-axial spaces and ventricular dilatation. There are areas of decreased attenuation within the white matter tracts of the supratentorial brain, consistent with microvascular disease changes. Vascular: No hyperdense vessel or unexpected calcification. Skull: Right frontal parietal and right parieto-occipital burr holes are seen. Sinuses/Orbits: Mild sphenoid sinus mucosal  thickening is noted. Other: There is mild right occipital scalp soft tissue swelling. IMPRESSION: 1. Generalized cerebral atrophy and microvascular disease changes of the supratentorial brain. 2. Mild right occipital scalp soft tissue swelling without evidence of an acute fracture or acute intracranial abnormality. 3. Mild sphenoid sinus disease. Electronically Signed: By: Suzen Dials M.D. On: 10/16/2024 17:40   CT Maxillofacial Wo Contrast Result Date: 10/16/2024 CLINICAL DATA:  Facial trauma. EXAM: CT MAXILLOFACIAL WITHOUT CONTRAST TECHNIQUE: Multidetector CT imaging of the maxillofacial structures was performed. Multiplanar CT image reconstructions were also generated. RADIATION DOSE REDUCTION: This exam was performed according to the departmental dose-optimization program which includes automated exposure control, adjustment of the mA and/or kV according to patient size and/or use of iterative reconstruction technique. COMPARISON:  None Available. FINDINGS: Osseous: An acute displaced fracture deformity is seen extending through the neck of the mandible on the left. Dislocation of the left mandibular head is also noted. Orbits: Negative. No traumatic or inflammatory finding. Sinuses: Clear. Soft tissues: Anterior  para mandibular soft tissue swelling is noted bilaterally with an associated 1.0 cm x 1.9 cm hematoma seen to the right of the mandibular symphysis (axial CT image 14, CT series 7). Mild soft tissue swelling is seen along the posterior fossa on the left. Limited intracranial: No significant or unexpected finding. IMPRESSION: 1. Acute displaced fracture of the neck of the mandible on the left with dislocation of the left mandibular head. 2. Anterior para mandibular soft tissue swelling with an associated 1.0 cm x 1.9 cm hematoma seen to the right of the mandibular symphysis. Correlation with physical examination is recommended to exclude the presence of a soft tissue mass. Electronically Signed    By: Suzen Dials M.D.   On: 10/16/2024 17:52    ROS Blood pressure (!) 187/162, pulse 82, temperature 98.3 F (36.8 C), temperature source Oral, resp. rate (!) 32, height 5' 7 (1.702 m), weight 81.6 kg, SpO2 100%. Physical Exam Constitutional:      Appearance: Little bit of swelling in the mental area HENT:     Head: Normocephalic and atraumatic.  Patient is so sedate that I cannot arouse him.    Right Ear: Tympanic membrane is without lesions and middle ear aerated, ear canal and external ear normal.     Left Ear: Tympanic membrane is without lesions and middle ear aerated, ear canal and external ear normal.     Nose: Nose without deviation of septum.  Turbinates with mild hypertrophy, No significant swelling or masses.     Oral cavity/oropharynx: Mucous membranes are moist. No lesions or masses.  Patient is edentulous but obviously cannot cooperate with any movement because of his level of sedation      Eyes:        Conjunctiva/sclera: Conjunctivae normal.     Pupils: Pupils are equal, round, and reactive to light.  Cardiovascular:     Rate and Rhythm: Normal rate.  Pulmonary:     Effort: Pulmonary effort is normal.  Lymphadenopathy:     Cervical: No cervical adenopathy or masses.salivary glands without lesions. .     Salivary glands- no mass or swelling Neurological:     Mental Status: He is completely sedated.      Assessment/Plan: Mandible fracture-he has a left condyle fracture that is displaced.  His other condyle is seated normally.  The patient currently is far too sedated with multiple medications for full evaluation.  He does not have any teeth and does not wear dentures.  This would mean that no MMF is needed.  He will stay on a soft diet.  He can be discharged on a soft diet once he is medically stable and follow-up in my office in 1-2 weeks [phone]. Norleen Notice 10/17/2024, 10:01 AM         [1]  Allergies Allergen Reactions   Levetiracetam  Other (See  Comments)    Severe anxiety, agitation, hyperactivity, very negative disposition   Tegretol [Carbamazepine] Anxiety and Other (See Comments)    Increased seizures/ Induces seizures   Penicillins Other (See Comments)    Reaction not noted   Topiramate Other (See Comments)    Reaction not noted

## 2024-10-18 ENCOUNTER — Other Ambulatory Visit: Payer: Self-pay

## 2024-10-18 DIAGNOSIS — R569 Unspecified convulsions: Secondary | ICD-10-CM | POA: Diagnosis not present

## 2024-10-18 MED ORDER — ENOXAPARIN SODIUM 40 MG/0.4ML IJ SOSY
40.0000 mg | PREFILLED_SYRINGE | INTRAMUSCULAR | Status: DC
  Administered 2024-10-18 – 2024-10-19 (×2): 40 mg via SUBCUTANEOUS
  Filled 2024-10-18 (×2): qty 0.4

## 2024-10-18 MED ORDER — METOPROLOL TARTRATE 12.5 MG HALF TABLET
12.5000 mg | ORAL_TABLET | Freq: Two times a day (BID) | ORAL | Status: DC
  Administered 2024-10-18 – 2024-10-20 (×5): 12.5 mg via ORAL
  Filled 2024-10-18 (×5): qty 1

## 2024-10-18 MED ORDER — QUETIAPINE FUMARATE 25 MG PO TABS
25.0000 mg | ORAL_TABLET | Freq: Every day | ORAL | Status: DC
  Administered 2024-10-18 – 2024-10-20 (×3): 25 mg via ORAL
  Filled 2024-10-18 (×3): qty 1

## 2024-10-18 MED ORDER — HALOPERIDOL LACTATE 5 MG/ML IJ SOLN
2.0000 mg | Freq: Once | INTRAMUSCULAR | Status: AC
  Administered 2024-10-18: 2 mg via INTRAVENOUS
  Filled 2024-10-18: qty 1

## 2024-10-18 MED ORDER — ENSURE PLUS HIGH PROTEIN PO LIQD
237.0000 mL | Freq: Two times a day (BID) | ORAL | Status: DC
  Administered 2024-10-18 – 2024-10-20 (×6): 237 mL via ORAL

## 2024-10-18 NOTE — Progress Notes (Signed)
 " PROGRESS NOTE  [name]  DOB: 13-Aug-1961  PCP: Rena Luke POUR, MD FMW:[id number]  DOA: 10/16/2024  LOS: 1 day  Hospital Day: 3  Subjective: Patient was seen and examined this morning. Propped up in bed.  Alert, awake, slow to respond.  Oriented to place only but able to have a yes/no conversation.  Not in distress.  Family not at bedside. Per RN at bedside, patient had restlessness and agitation last night. Afebrile, heart rate in 40s and 50s, blood pressure 130s, breathing on room air. It seems metoprolol  dose was reduced to 12.5 mg twice daily last night because of significant bradycardia No labs today  Brief narrative: Edmar Blankenburg is a 64 y.o. male with PMH significant for DM2, HTN, HLD, COPD, GERD, CHF, hypothyroidism. Patient lives at home, significantly limited in mobility, sometimes uses walker.  Wife has noticed worsening dementia..  1/23, patient was brought to the ED from home after seizure activity leading to ground-level fall. Seizure was witnessed by wife. Wife reported that he fell hard on the floor face forward Patient apparently ran out of phenobarbital  few days ago but was still taking Vimpat .   Since patient changed insurance this year, he was dropped by his old PCP and hence not been able to refill phenobarbital  for the last few days.  Wife tried but was not able to get in touch with neurology office.  Also reports he ran out of Synthroid  3 months ago.    In the ED, patient was afebrile, blood pressure 160s, breathing on room air Labs with WBC count 9.1, hemoglobin 11.8, sodium 129, potassium 3, phenobarbital  level was low at 13, TSH elevated to 39 with free T4 and T3 significantly low. Urinalysis unremarkable  CT head did not show any acute intracranial findings, showed generalized atrophy and microvascular disease changes. He had mild right occipital scalp soft tissue swelling.   Maxillofacial CT showed an acute displaced fracture of the neck of the  mandible on the left with dislocation of the left mandibular head and an associated 1 x 1.9 cm hematoma to the right of the mandibular symphysis.   EDP discussed with neurology who recommended resumption of his home AEDs.   Dr. Roark with ENT was also consulted Admitted to [facility]  Assessment and plan: Breakthrough seizures  H/o seizure disorder Supposed to be on Vimpat  and phenobarbital  at home but apparently ran out of phenobarbital  for few days.  Presented with breakthrough seizure Phenobarbital  level was subtherapeutic as expected Vimpat  and phenobarbital  were both resumed  Acute metabolic encephalopathy  Underlying dementia Anxiety/depression At baseline, patient seems to be having progressive dementia.  Wife states that sometimes he gets bad sundowning.  He gets restless and verbally abusive. In the ED, he was restless, agitated and required IV Ativan , IV Haldol .  Slept for 7 hours and later woke up to participate with PT. Currently on Xanax  0.5 mg twice daily, melatonin 3 mg nightly and Seroquel  25 mg as needed, Ativan  1 mg every 4 hours PRN.  I will schedule Seroquel  25 mg for nighttime  Acute mandibular fracture Secondary to fall after seizure Maxillofacial CT showed an acute displaced fracture of the neck of the mandible on the left with dislocation of the left mandibular head and an associated 1 x 1.9 cm hematoma to the right of the mandibular symphysis.  ENT Dr. Roark was consulted.  Per his note, since patient does not have any teeth and does not wear dentures, he is not in MMF.  He can stay on soft diet and follow-up as an outpatient.  Severe hypothyroidism Apparently ran out of Synthroid  few months ago. TSH elevated to 39 with free T4 and T3 significantly low. No signs of myxedema coma except for hyponatremia 1 dose of IV Synthroid  was given Currently on Synthroid  150 mcg daily Normal random cortisol level Recent Labs    01/30/24 2032 02/03/24 0445 05/13/24 0841  10/16/24 1646  TSH 48.029* 30.797* 22.872* 39.200*    Hyponatremia Hypokalemia Hyponatremia likely due to severe hypothyroidism Potassium replacement given Continue monitor electrolytes.  Repeat labs tomorrow Recent Labs  Lab 10/16/24 1646 10/17/24 0439  NA 129* 130*  K 3.0* 3.3*  CL 91* 93*  CO2 21* 28  GLUCOSE 123* 87  BUN 11 13  CREATININE 0.78 0.84  CALCIUM  9.0 8.7*   Type 2 diabetes mellitus A1c 6.1 in 01/31/2024.   PTA meds- metformin Currently on SSI/Accu-Cheks Recent Labs  Lab 10/16/24 1637 10/17/24 1448  GLUCAP 133* 107*   HTN Sinus bradycardia Heart rate in 40s and 50s,  metoprolol  dose reduced to 12.5 mg mg twice daily  COPD Continue bronchodilators      Nutrition Status:         Mobility:   PT Orders: Active   PT Follow up Rec: Skilled Nursing-Short Term Rehab (<3 Hours/Day) (Hhpt If Pt Refuses Placement)10/17/2024 1730     Goals of care   Code Status: Full Code     DVT prophylaxis:  SCDs Start: 10/17/24 0027   Antimicrobials: None Fluid: Start NS at 75 mL/h Consultants: ENT Family Communication: Called and discussed with patient's wife Mrs. Elvie Gay on 1/24.  Status: Observation Level of care:  Telemetry   Patient is from: Home Needs to continue in-hospital care: Ongoing workup and management Anticipated d/c to: Pending clinical course, pending PT eval      Diet:  Diet Order             DIET DYS 3 Room service appropriate? No; Fluid consistency: Thin  Diet effective 1400                   Scheduled Meds:  ALPRAZolam   0.5 mg Oral BID   feeding supplement  237 mL Oral BID BM   lacosamide   200 mg Oral BID   levothyroxine   150 mcg Oral QAC breakfast   melatonin  3 mg Oral QHS   metoprolol  tartrate  12.5 mg Oral BID   PHENobarbital   64.8 mg Oral BID   potassium chloride   40 mEq Oral BID   QUEtiapine   25 mg Oral QHS   umeclidinium bromide   1 puff Inhalation Daily    PRN meds: acetaminophen  **OR**  acetaminophen , albuterol , LORazepam , ondansetron  **OR** ondansetron  (ZOFRAN ) IV   Infusions:   sodium chloride  75 mL/hr at 10/18/24 0316    Antimicrobials: Anti-infectives (From admission, onward)    None       Objective: Vitals:   10/18/24 0750 10/18/24 0753  BP: 139/63   Pulse:    Resp: 14 12  Temp: 98 F (36.7 C)   SpO2: 97%     Intake/Output Summary (Last 24 hours) at 10/18/2024 1000 Last data filed at 10/18/2024 0215 Gross per 24 hour  Intake 360 ml  Output 350 ml  Net 10 ml   Filed Weights   10/16/24 1632  Weight: 81.6 kg   Weight change:  Body mass index is 28.19 kg/m.   Physical Exam: General exam: Pleasant, elderly Caucasian male. Skin: No rashes,  lesions or ulcers. HEENT: Atraumatic, normocephalic, no obvious bleeding Lungs: Clear to auscultation bilaterally,  CVS: S1, S2, no murmur,   GI/Abd: Soft, nontender, nondistended, bowel sound present,   CNS: Alert, awake, oriented to place only.  Slow but able to have yes no conversation.  Not restless or agitated at this time. Extremities: No pedal edema, no calf tenderness,   Data Review: I have personally reviewed the laboratory data and studies available.  F/u labs ordered Unresulted Labs (From admission, onward)     Start     Ordered   10/19/24 0500  CBC with Differential/Platelet  Tomorrow morning,   R        10/18/24 0902   10/19/24 0500  Basic metabolic panel with GFR  Tomorrow morning,   R        10/18/24 0902   10/16/24 1801  Lacosamide   ONCE - STAT,   URGENT        10/16/24 1800            Signed, Chapman Rota, MD [facility] 10/18/2024  "

## 2024-10-18 NOTE — Plan of Care (Signed)
" °  Problem: Clinical Measurements: Goal: Ability to maintain clinical measurements within normal limits will improve Outcome: Progressing Goal: Will remain free from infection Outcome: Progressing Goal: Diagnostic test results will improve Outcome: Progressing Goal: Respiratory complications will improve Outcome: Progressing Goal: Cardiovascular complication will be avoided Outcome: Progressing   Problem: Nutrition: Goal: Adequate nutrition will be maintained Outcome: Progressing   Problem: Elimination: Goal: Will not experience complications related to bowel motility Outcome: Progressing Goal: Will not experience complications related to urinary retention Outcome: Progressing   Problem: Pain Managment: Goal: General experience of comfort will improve and/or be controlled Outcome: Progressing   Problem: Safety: Goal: Ability to remain free from injury will improve Outcome: Progressing   Problem: Education: Goal: Knowledge of General Education information will improve Description: Including pain rating scale, medication(s)/side effects and non-pharmacologic comfort measures Outcome: Not Progressing   Problem: Health Behavior/Discharge Planning: Goal: Ability to manage health-related needs will improve Outcome: Not Progressing   Problem: Activity: Goal: Risk for activity intolerance will decrease Outcome: Not Progressing   Problem: Coping: Goal: Level of anxiety will decrease Outcome: Not Progressing   "

## 2024-10-18 NOTE — TOC CAGE-AID Note (Signed)
 Transition of Care [facility]) - CAGE-AID Screening  Patient Details  Name: [name] MRN: [id number] Date of Birth: 03-Aug-1961  Clinical Narrative:  Patient denies any current alcohol or drug use, substance abuse resources not provided at this time.  CAGE-AID Screening:   Have You Ever Felt You Ought to Cut Down on Your Drinking or Drug Use?: No Have People Annoyed You By Critizing Your Drinking Or Drug Use?: No Have You Felt Bad Or Guilty About Your Drinking Or Drug Use?: No Have You Ever Had a Drink or Used Drugs First Thing In The Morning to Steady Your Nerves or to Get Rid of a Hangover?: No CAGE-AID Score: 0  Substance Abuse Education Offered: No

## 2024-10-18 NOTE — TOC Initial Note (Addendum)
 Transition of Care [facility]) - Initial/Assessment Note    Patient Details  Name: [name] MRN: [id number] Date of Birth: [birth year]  Transition [facility]) CM/SW Contact:    Inocente GORMAN Kindle, LCSW Phone Number: 10/18/2024, 3:25 PM  Clinical Narrative:                 CSW spoke with patient's wife via phone as she is not in the room due to the weather. She shared patient's history at nursing facilities and a bad experience with a nurse at [facility] that she had to report. She reported patient refuses to go to a nursing facility ever again. Sadly, she needs a hip replacement and does not walk well but she is patient's full time caregiver. Her stepdaughters are not able to provide assistance and neither are her children. SNF called [facility] on patient and a social worker came out and provided resources but spouse has not had time to look into them. She stated they cannot afford in home care. CSW suggested she look into private duty home care as they likely make too much for Medicaid (she reported he makes over $2000/month and that she makes $9000/month but unsure if that is accurate; they live in a modular home). CSW provided private duty contact numbers and info for the [facility] program and she reported she will look into. He has a rollator, walker, BSC. There is no room for a hospital bed. She reported agreement with setting up home health services. Patient will require PTAR for transport home. Will update RNCM.   Expected Discharge Plan: Home w Home Health Services Barriers to Discharge: Continued Medical Work up   Patient Goals and CMS Choice Patient states their goals for this hospitalization and ongoing recovery are:: get stronger CMS Medicare.gov Compare Post Acute Care list provided to:: Patient Represenative (must comment) Choice offered to / list presented to : Spouse [place] ownership interest in [facility].provided to:: Spouse    Expected Discharge Plan and  Services In-house Referral: Clinical Social Work   Post Acute Care Choice: Skilled Nursing Facility Living arrangements for the past 2 months: Single Family Home                                      Prior Living Arrangements/Services Living arrangements for the past 2 months: Single Family Home Lives with:: Spouse Patient language and need for interpreter reviewed:: Yes Do you feel safe going back to the place where you live?: Yes      Need for Family Participation in Patient Care: Yes (Comment) Care giver support system in place?: Yes (comment)   Criminal Activity/Legal Involvement Pertinent to Current Situation/Hospitalization: No - Comment as needed  Activities of Daily Living   ADL Screening (condition at time of admission) Independently performs ADLs?: No Does the patient have a NEW difficulty with bathing/dressing/toileting/self-feeding that is expected to last >3 days?: Yes (Initiates electronic notice to provider for possible OT consult) Does the patient have a NEW difficulty with getting in/out of bed, walking, or climbing stairs that is expected to last >3 days?: Yes (Initiates electronic notice to provider for possible PT consult) Does the patient have a NEW difficulty with communication that is expected to last >3 days?: No Is the patient deaf or have difficulty hearing?: No Does the patient have difficulty seeing, even when wearing glasses/contacts?: Yes Does the patient have difficulty concentrating, remembering,  or making decisions?: Yes  Permission Sought/Granted Permission sought to share information with : Facility Medical Sales Representative, Family Supports Permission granted to share information with : No  Share Information with NAME: Kristen, [place] [phone]  [phone]  Permission granted to share info w AGENCY: SNFs        Emotional Assessment Appearance:: Appears stated age Attitude/Demeanor/Rapport: Unable to Assess Affect (typically  observed): Unable to Assess Orientation: : Oriented to Self, Oriented to Place Alcohol / Substance Use: Not Applicable Psych Involvement: No (comment)  Admission diagnosis:  Hypokalemia [E87.6] Seizure (HCC) [R56.9] Injury of head, initial encounter [S09.90XA] Scalp laceration, initial encounter [S01.01XA] Hypothyroidism, unspecified type [E03.9] Closed fracture of left side of mandibular body, initial encounter (HCC) [S02.602A] Patient Active Problem List   Diagnosis Date Noted   Seizure (HCC) 10/16/2024   GIB (gastrointestinal bleeding) 02/29/2024   Ischemic colitis 02/29/2024   Rectal bleeding 02/29/2024   Abdominal pain 02/29/2024   Acute lower GI bleeding 02/28/2024   Colitis 02/28/2024   Deep vein thrombosis (DVT) of right upper extremity (HCC) 02/28/2024   Subdural hematoma (HCC) 01/30/2024   Toxic metabolic encephalopathy 01/24/2016   Pressure ulcer 01/23/2016   Non-insulin  dependent type 2 diabetes mellitus (HCC)    Altered mental status 01/22/2016   COPD (chronic obstructive pulmonary disease) (HCC) 01/22/2016   Thrombocytopenia 01/22/2016   Acute encephalopathy 11/01/2015   Chronic daily headache 03/08/2015   Localization-related symptomatic 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, intractable, without status epilepticus (HCC) 03/07/2015   Memory loss 03/07/2015   Seizure disorder (HCC) 03/05/2014   Other and unspecified hyperlipidemia 03/05/2014   Essential hypertension, benign 03/05/2014   Diabetes (HCC) 03/05/2014   Depression 03/05/2014   Generalized anxiety disorder 03/05/2014   Hypothyroidism 03/05/2014   Poor dentition 03/05/2014   PCP:  Rena Luke POUR, MD Pharmacy:   [facility] - [place], [place] - [address] [facility] [address] [place] [address] Phone: [phone] Fax: [phone]  [id number] - [place], [place] - [address] AT [address] [place] [address] Phone: [phone] Fax: [phone]     Social Drivers of Health (SDOH) Social History: SDOH Screenings   Food Insecurity: Patient Unable To Answer (10/18/2024)  Housing: Low Risk (03/30/2024)   Received from [facility]  Transportation Needs: No Transportation Needs (03/30/2024)   Received from [facility]  Utilities: Not At Risk (03/30/2024)   Received from [facility]  Financial Resource Strain: Low Risk (03/30/2024)   Received from [facility]  Physical Activity: Unknown (03/27/2023)   Received from [facility]  Social Connections: Patient Unable To Answer (02/29/2024)  Stress: Stress Concern Present (03/27/2023)   Received from [facility]  Tobacco Use: Medium Risk (05/26/2024)   SDOH Interventions:     Readmission Risk Interventions     No data to display

## 2024-10-18 NOTE — Progress Notes (Signed)
 [facility] night cross cover note:   I was notified by the patient's RN that the patient developed some sinus bradycardia after receiving his outpatient metoprolol  tartrate 25 mg p.o. twice daily, which he takes for essential hypertension.  Leading up to tonight's dose of metoprolol  to tartrate, the patient was noted to be in normal sinus rhythm with heart rates in the 60s to 80s.  Following his evening dose of metoprolol  tartrate, heart rate decreased into the 40s, with EKG showing sinus bradycardia without overt evidence of acute ischemic changes.  Not associate with any chest pain.  Other vital signs appear stable, including afebrile, with systolic blood pressures in the 150s, respiratory rate 17, and oxygen saturations in the high 90s on room air.  Noted at time of admission to have significant hypothyroidism after running out of Synthroid  as an outpatient, potentially contributing to the patient's lower heart rates following his scheduled evening dose of metoprolol  to tartrate.   For now, will reduce dose of metoprolol  tartrate from 25 mg p.o. twice daily to 12.5 mg p.o. twice daily, with parameters to hold this beta-blocker for heart rate less than 60.     Eva Pore, DO Hospitalist

## 2024-10-19 ENCOUNTER — Other Ambulatory Visit (HOSPITAL_COMMUNITY): Payer: Self-pay

## 2024-10-19 ENCOUNTER — Telehealth (HOSPITAL_COMMUNITY): Payer: Self-pay

## 2024-10-19 DIAGNOSIS — R569 Unspecified convulsions: Secondary | ICD-10-CM | POA: Diagnosis not present

## 2024-10-19 LAB — BASIC METABOLIC PANEL WITH GFR
Anion gap: 10 (ref 5–15)
BUN: 14 mg/dL (ref 8–23)
CO2: 23 mmol/L (ref 22–32)
Calcium: 8.2 mg/dL — ABNORMAL LOW (ref 8.9–10.3)
Chloride: 98 mmol/L (ref 98–111)
Creatinine, Ser: 0.81 mg/dL (ref 0.61–1.24)
GFR, Estimated: >60 mL/min
Glucose, Bld: 108 mg/dL — ABNORMAL HIGH (ref 70–99)
Potassium: 4.5 mmol/L (ref 3.5–5.1)
Sodium: 131 mmol/L — ABNORMAL LOW (ref 135–145)

## 2024-10-19 LAB — CBC WITH DIFFERENTIAL/PLATELET
Abs Immature Granulocytes: 0.02 10*3/uL (ref 0.00–0.07)
Basophils Absolute: 0.1 10*3/uL (ref 0.0–0.1)
Basophils Relative: 1 %
Eosinophils Absolute: 0 10*3/uL (ref 0.0–0.5)
Eosinophils Relative: 0 %
HCT: 28.5 % — ABNORMAL LOW (ref 39.0–52.0)
Hemoglobin: 9.9 g/dL — ABNORMAL LOW (ref 13.0–17.0)
Immature Granulocytes: 0 %
Lymphocytes Relative: 47 %
Lymphs Abs: 3.9 10*3/uL (ref 0.7–4.0)
MCH: 31.8 pg (ref 26.0–34.0)
MCHC: 34.7 g/dL (ref 30.0–36.0)
MCV: 91.6 fL (ref 80.0–100.0)
Monocytes Absolute: 0.6 10*3/uL (ref 0.1–1.0)
Monocytes Relative: 7 %
Neutro Abs: 3.8 10*3/uL (ref 1.7–7.7)
Neutrophils Relative %: 45 %
Platelets: 255 10*3/uL (ref 150–400)
RBC: 3.11 MIL/uL — ABNORMAL LOW (ref 4.22–5.81)
RDW: 16.1 % — ABNORMAL HIGH (ref 11.5–15.5)
WBC: 8.4 10*3/uL (ref 4.0–10.5)
nRBC: 0 % (ref 0.0–0.2)

## 2024-10-19 MED ORDER — CITALOPRAM HYDROBROMIDE 10 MG PO TABS
20.0000 mg | ORAL_TABLET | Freq: Every day | ORAL | Status: DC
  Administered 2024-10-19 – 2024-10-20 (×2): 20 mg via ORAL
  Filled 2024-10-19 (×2): qty 2

## 2024-10-19 MED ORDER — ALPRAZOLAM 0.25 MG PO TABS
0.5000 mg | ORAL_TABLET | Freq: Two times a day (BID) | ORAL | Status: DC | PRN
  Administered 2024-10-19 – 2024-10-20 (×3): 0.5 mg via ORAL
  Filled 2024-10-19 (×3): qty 2

## 2024-10-19 MED ORDER — HYDRALAZINE HCL 20 MG/ML IJ SOLN
10.0000 mg | Freq: Four times a day (QID) | INTRAMUSCULAR | Status: DC | PRN
  Administered 2024-10-19: 10 mg via INTRAVENOUS
  Filled 2024-10-19: qty 1

## 2024-10-19 MED ORDER — ACETAMINOPHEN 500 MG PO TABS
1000.0000 mg | ORAL_TABLET | Freq: Three times a day (TID) | ORAL | Status: DC
  Administered 2024-10-19 – 2024-10-20 (×6): 1000 mg via ORAL
  Filled 2024-10-19 (×6): qty 2

## 2024-10-19 MED ORDER — TRAZODONE HCL 100 MG PO TABS
100.0000 mg | ORAL_TABLET | Freq: Every day | ORAL | Status: DC
  Administered 2024-10-19 – 2024-10-20 (×2): 100 mg via ORAL
  Filled 2024-10-19 (×2): qty 1

## 2024-10-19 NOTE — Plan of Care (Signed)

## 2024-10-19 NOTE — Progress Notes (Signed)
 " PROGRESS NOTE  [name]  DOB: 1961/08/05  PCP: Rena Luke POUR, MD FMW:[id number]  DOA: 10/16/2024  LOS: 2 days  Hospital Day: 4  Subjective: Patient was seen and examined this morning. Sitting up in recliner.  Alert, awake, knows he is in the hospital.  Able to answer yes/no questions.  Family not at bedside. Patient states he did not sleep well last night, not sure why.  Afebrile, blood pressure in 140s to 160s, breathing on room air. Labs this morning with sodium 131, hemoglobin 9.9  Brief narrative: [name] is a 64 y.o. male with PMH significant for DM2, HTN, HLD, COPD, GERD, CHF, hypothyroidism. Patient lives at home, significantly limited in mobility, sometimes uses walker.  Wife has noticed worsening dementia..  1/23, patient was brought to the ED from home after seizure activity leading to ground-level fall. Seizure was witnessed by wife. Wife reported that he fell hard on the floor face forward Patient apparently ran out of phenobarbital  few days ago but was still taking Vimpat .   Since patient changed insurance this year, he was dropped by his old PCP and hence not been able to refill phenobarbital  for the last few days.  Wife tried but was not able to get in touch with neurology office.  Also reports he ran out of Synthroid  3 months ago.    In the ED, patient was afebrile, blood pressure 160s, breathing on room air Labs with WBC count 9.1, hemoglobin 11.8, sodium 129, potassium 3, phenobarbital  level was low at 13, TSH elevated to 39 with free T4 and T3 significantly low. Urinalysis unremarkable  CT head did not show any acute intracranial findings, showed generalized atrophy and microvascular disease changes. He had mild right occipital scalp soft tissue swelling.   Maxillofacial CT showed an acute displaced fracture of the neck of the mandible on the left with dislocation of the left mandibular head and an associated 1 x 1.9 cm hematoma to the right of the  mandibular symphysis.   EDP discussed with neurology who recommended resumption of his home AEDs.   Dr. Roark with ENT was also consulted Admitted to [facility]  Assessment and plan: Breakthrough seizures  H/o seizure disorder Supposed to be on Vimpat  and phenobarbital  at home but apparently ran out of phenobarbital  for few days.  Presented with breakthrough seizure Phenobarbital  level was subtherapeutic as expected Vimpat  and phenobarbital  were both resumed.   Acute mandibular fracture Secondary to fall after seizure Maxillofacial CT showed an acute displaced fracture of the neck of the mandible on the left with dislocation of the left mandibular head and an associated 1 x 1.9 cm hematoma to the right of the mandibular symphysis.  ENT Dr. Roark was consulted.  Per his note, since patient does not have any teeth and does not wear dentures, he is not in MMF.  He can stay on soft diet and follow-up as an outpatient.  Acute metabolic encephalopathy  Underlying dementia Anxiety/depression Based on wife's account of patient's overall health, patient probably has progressive dementia.  Wife states that sometimes he gets bad sundowning.  He gets restless and verbally abusive. In the hospital, he has had episodes of agitation requiring IV Ativan , IV Haldol  Currently on Seroquel  25 mg nightly, melatonin 3 mg nightly 1/26, mental status improving but in the last 24 hours, he has had intermittent confusion, delirium.  Denies fever last night.  Resume Celexa  20 mg nightly and trazodone  100 mg nightly.  Switch Xanax  0.5 mg twice daily  to as needed.  Stop IV Ativan  as needed.  Severe hypothyroidism Apparently ran out of Synthroid  few months ago. TSH elevated to 39 with free T4 and T3 significantly low. No signs of myxedema coma except for hyponatremia 1 dose of IV Synthroid  was given Currently on Synthroid  150 mcg daily Normal random cortisol level Recent Labs    01/30/24 2032 02/03/24 0445  05/13/24 0841 10/16/24 1646  TSH 48.029* 30.797* 22.872* 39.200*    Hyponatremia Hypokalemia Hyponatremia likely due to severe hypothyroidism Potassium level improved with replacement. Continue monitor electrolytes.  Repeat labs tomorrow Recent Labs  Lab 10/16/24 1646 10/17/24 0439 10/19/24 0127  NA 129* 130* 131*  K 3.0* 3.3* 4.5  CL 91* 93* 98  CO2 21* 28 23  GLUCOSE 123* 87 108*  BUN 11 13 14   CREATININE 0.78 0.84 0.81  CALCIUM  9.0 8.7* 8.2*   Type 2 diabetes mellitus A1c 6.1 in 01/31/2024.   PTA meds- metformin.  Currently on hold.  His A1c is well-controlled.  I do not see a need to resume metformin at discharge Currently on SSI/Accu-Cheks Recent Labs  Lab 10/16/24 1637 10/17/24 1448  GLUCAP 133* 107*   HTN Sinus bradycardia PTA med list showed metoprolol  25 mg twice daily.  He was resumed on that but his heart rate started to dip down to 40s and hence metoprolol  dose was reduced to 12.5 mg twice daily.  COPD Continue bronchodilators  Impaired mobility Osteoarthritis Needs hip replacement but has not been able to get yet. Mobility limited at baseline.  Further limited with hospitalization.  Was seen by PT.  Recommended SNF but patient apparently had a bad experience at one of the SNFs.  He wants to return home at discharge. Home health services ordered. Pain regimen --- Scheduled: Tylenol  1 g 3 times daily --- PRN:    Impaired mobility  Seen by PT.  Recommended SNF but patient apparently had a bad experience with an SNF in the past and wants to go home when ready. PT Follow up Rec: Skilled Nursing-Short Term Rehab (<3 Hours/Day) (Hhpt If Pt Refuses Placement)10/17/2024 1730    Goals of care   Code Status: Full Code     DVT prophylaxis:  enoxaparin  (LOVENOX ) injection 40 mg Start: 10/18/24 1545 SCDs Start: 10/17/24 0027   Antimicrobials: None Fluid: Stop IV fluid Consultants: ENT Family Communication: Called and discussed with patient's wife Mrs.  [name] on 1/24.  Status: Inpatient Level of care:  Telemetry   Patient is from: Home Needs to continue in-hospital care: Improve mental status.  Hopefully stable for discharge in 1 to 2 days. Anticipated d/c to: Home with home health services      Diet:  Diet Order             DIET DYS 3 Room service appropriate? No; Fluid consistency: Thin  Diet effective 1400                   Scheduled Meds:  acetaminophen   1,000 mg Oral TID   citalopram   20 mg Oral QHS   enoxaparin  (LOVENOX ) injection  40 mg Subcutaneous Q24H   feeding supplement  237 mL Oral BID BM   lacosamide   200 mg Oral BID   levothyroxine   150 mcg Oral QAC breakfast   melatonin  3 mg Oral QHS   metoprolol  tartrate  12.5 mg Oral BID   PHENobarbital   64.8 mg Oral BID   potassium chloride   40 mEq Oral BID   QUEtiapine   25 mg Oral QHS   traZODone   100 mg Oral QHS   umeclidinium bromide   1 puff Inhalation Daily    PRN meds: albuterol , ALPRAZolam , hydrALAZINE , ondansetron  **OR** ondansetron  (ZOFRAN ) IV   Infusions:   sodium chloride  75 mL/hr at 10/19/24 0059    Antimicrobials: Anti-infectives (From admission, onward)    None       Objective: Vitals:   10/19/24 0812 10/19/24 0843  BP: (!) 169/66 (!) 149/74  Pulse: 64 (!) 52  Resp:    Temp:    SpO2:      Intake/Output Summary (Last 24 hours) at 10/19/2024 0950 Last data filed at 10/19/2024 0455 Gross per 24 hour  Intake 2919.89 ml  Output 1925 ml  Net 994.89 ml   Filed Weights   10/16/24 1632  Weight: 81.6 kg   Weight change:  Body mass index is 28.19 kg/m.   Physical Exam: General exam: Pleasant, elderly Caucasian male.  Not in distress Skin: No rashes, lesions or ulcers. HEENT: Atraumatic, normocephalic, no obvious bleeding Lungs: Clear to auscultation bilaterally,  CVS: S1, S2, no murmur,   GI/Abd: Soft, nontender, nondistended, bowel sound present,   CNS: Alert, awake, oriented to place only. Not restless or agitated  at this time. Extremities: No pedal edema, no calf tenderness,   Data Review: I have personally reviewed the laboratory data and studies available.  F/u labs ordered Unresulted Labs (From admission, onward)     Start     Ordered   10/16/24 1801  Lacosamide   ONCE - STAT,   URGENT        10/16/24 1800            Signed, Chapman Rota, MD [facility] 10/19/2024  "

## 2024-10-19 NOTE — Progress Notes (Signed)
 Physical Therapy Treatment Patient Details Name: [name] MRN: [id number] DOB: 1960/11/09 Today's Date: 10/19/2024   History of Present Illness 64 y.o. male presents to [facility] hospital on 10/16/2024 with seizure-like activity resulting in a fall. CT scan with findings of acute displaced fx of the neck of the mandible on the left with dislocation of the L mandibular head and hematoma. PMH includes DMI, HTN, HLD, COPD, GERD, CHF, hypothyroidism.    PT Comments  Patient remains fearful of falling, yet with +2 for safety he was able to ambulate 40 ft with RW and min assist for balance and cues for safest use of RW. He reports he has a wheelchair at home and it was unclear if he can use wheelchair throughout the house. Recommend HHPT as pt/wife are refusing SNF. He is agreeable to HHPT.     If plan is discharge home, recommend the following: A lot of help with bathing/dressing/bathroom;Assistance with cooking/housework;Direct supervision/assist for medications management;Assist for transportation;Direct supervision/assist for financial management;Help with stairs or ramp for entrance;Supervision due to cognitive status;A little help with walking and/or transfers   Can travel by private vehicle        Equipment Recommendations  Other (comment) (pt reports has a wheelchair at home)    Recommendations for Other Services       Precautions / Restrictions Precautions Precautions: Fall Recall of Precautions/Restrictions: Impaired Precaution/Restrictions Comments: siezure Restrictions Weight Bearing Restrictions Per Provider Order: No     Mobility  Bed Mobility Overal bed mobility: Needs Assistance Bed Mobility: Supine to Sit     Supine to sit: Supervision          Transfers Overall transfer level: Needs assistance Equipment used: None, Rolling walker (2 wheels) Transfers: Sit to/from Stand, Bed to chair/wheelchair/BSC Sit to Stand: Contact guard assist   Step pivot transfers: Min  assist       General transfer comment: stand-pivot to chair with slight unsteadiness; stood from chair to RW    Ambulation/Gait Ambulation/Gait assistance: Min Chemical Engineer (Feet): 40 Feet Assistive device: Rolling walker (2 wheels) Gait Pattern/deviations: Step-through pattern, Decreased stride length, Wide base of support, Drifts right/left   Gait velocity interpretation: <1.8 ft/sec, indicate of risk for recurrent falls       Stairs             Wheelchair Mobility     Tilt Bed    Modified Rankin (Stroke Patients Only)       Balance Overall balance assessment: Needs assistance Sitting-balance support: No upper extremity supported, Feet supported Sitting balance-Leahy Scale: Good     Standing balance support: Bilateral upper extremity supported, Reliant on assistive device for balance Standing balance-Leahy Scale: Poor                              Communication Communication Communication: Impaired Factors Affecting Communication: Reduced clarity of speech  Cognition Arousal: Alert Behavior During Therapy: WFL for tasks assessed/performed   PT - Cognitive impairments: No family/caregiver present to determine baseline                       PT - Cognition Comments: alert, oriented to person, place, situation Following commands: Intact      Cueing Cueing Techniques: Verbal cues  Exercises      General Comments        Pertinent Vitals/Pain Pain Assessment Pain Assessment: Faces Faces Pain Scale: Hurts little more Pain Location:  head Pain Descriptors / Indicators: Aching Pain Intervention(s): Limited activity within patient's tolerance, Monitored during session    Home Living                   Home Equipment: Rolling Walker (2 wheels);Cane - single point;Wheelchair - manual      Prior Function            PT Goals (current goals can now be found in the care plan section) Acute Rehab PT Goals Patient  Stated Goal: to return home Time For Goal Achievement: 10/31/24 Potential to Achieve Goals: Fair Progress towards PT goals: Progressing toward goals    Frequency    Min 2X/week      PT Plan      Co-evaluation              AM-PAC PT 6 Clicks Mobility   Outcome Measure  Help needed turning from your back to your side while in a flat bed without using bedrails?: A Little Help needed moving from lying on your back to sitting on the side of a flat bed without using bedrails?: A Little Help needed moving to and from a bed to a chair (including a wheelchair)?: A Little Help needed standing up from a chair using your arms (e.g., wheelchair or bedside chair)?: A Little Help needed to walk in hospital room?: A Little Help needed climbing 3-5 steps with a railing? : A Little 6 Click Score: 18    End of Session Equipment Utilized During Treatment: Gait belt Activity Tolerance: Patient tolerated treatment well Patient left: with call bell/phone within reach;in chair;with chair alarm set Nurse Communication: Mobility status PT Visit Diagnosis: Other abnormalities of gait and mobility (R26.89);Muscle weakness (generalized) (M62.81);Pain Pain - part of body:  (head)     Time: 9179-9158 PT Time Calculation (min) (ACUTE ONLY): 21 min  Charges:    $Gait Training: 8-22 mins PT General Charges $$ ACUTE PT VISIT: 1 Visit                      Macario RAMAN, PT Acute Rehabilitation Services  Office [phone]    Macario SHAUNNA Soja 10/19/2024, 10:16 AM

## 2024-10-19 NOTE — Plan of Care (Signed)
 At times become tearful, anxious and yells out often. Received anti anxiety medicine.  Calm down after talking with family on phone.   Problem: Education: Goal: Knowledge of General Education information will improve Description: Including pain rating scale, medication(s)/side effects and non-pharmacologic comfort measures Outcome: Progressing   Problem: Coping: Goal: Level of anxiety will decrease Outcome: Progressing   Problem: Clinical Measurements: Goal: Will remain free from infection Outcome: Progressing   Problem: Activity: Goal: Risk for activity intolerance will decrease Outcome: Progressing   Problem: Elimination: Goal: Will not experience complications related to urinary retention Outcome: Progressing   Problem: Skin Integrity: Goal: Risk for impaired skin integrity will decrease Outcome: Progressing   Problem: Safety: Goal: Ability to remain free from injury will improve Outcome: Progressing

## 2024-10-19 NOTE — Telephone Encounter (Signed)
 Pharmacy Patient Advocate Encounter  Insurance verification completed.    The patient is insured through [employer]. Patient has Medicare and is not eligible for a copay card, but may be able to apply for patient assistance or Medicare RX Payment Plan (Patient Must reach out to their plan, if eligible for payment plan), if available.    Ran test claim for Incruse Ellipta  62.47mcg and the current 30 day co-pay is $12.65.   This test claim was processed through [facility]- copay amounts may vary at other pharmacies due to pharmacy/plan contracts, or as the patient moves through the different stages of their insurance plan.

## 2024-10-19 NOTE — Evaluation (Signed)
 Occupational Therapy Evaluation Patient Details Name: [name] MRN: [id number] DOB: 02/09/1961 Today's Date: 10/19/2024   History of Present Illness   64 y.o. male presents to [facility] hospital on 10/16/2024 with seizure-like activity resulting in a fall. CT scan with findings of acute displaced fx of the neck of the mandible on the left with dislocation of the L mandibular head and hematoma. PMH includes DMI, HTN, HLD, COPD, GERD, CHF, hypothyroidism.     Clinical Impressions This 64 yo male admitted with above presents to acute OT with PLOF truly unknown due to pt's dementia but he reports he walks all over with RW (currently fear of falling when up even with RW), does most of his ADLs (says wife A's sometime). He currently is setup-mod A for basic ADLs from a RW level (min A). He will continue to benefit from acute OT with follow up HHOT (SNF probably a better option, but wife does not want).      If plan is discharge home, recommend the following:   A little help with walking and/or transfers;A lot of help with bathing/dressing/bathroom;Assistance with cooking/housework;Assist for transportation;Help with stairs or ramp for entrance;Direct supervision/assist for financial management;Direct supervision/assist for medications management     Functional Status Assessment   Patient has had a recent decline in their functional status and demonstrates the ability to make significant improvements in function in a reasonable and predictable amount of time.     Equipment Recommendations   None recommended by OT      Precautions/Restrictions   Precautions Precautions: Fall Recall of Precautions/Restrictions: Impaired Precaution/Restrictions Comments: seizure Restrictions Weight Bearing Restrictions Per Provider Order: No     Mobility Bed Mobility Overal bed mobility: Needs Assistance Bed Mobility: Supine to Sit     Supine to sit: Supervision Sit to supine: Supervision         Transfers Overall transfer level: Needs assistance Equipment used: None, Rolling walker (2 wheels) Transfers: Sit to/from Stand, Bed to chair/wheelchair/BSC Sit to Stand: Contact guard assist     Step pivot transfers: Min assist     General transfer comment: stand-pivot to chair with slight unsteadiness; stood from chair to RW      Balance Overall balance assessment: Needs assistance Sitting-balance support: No upper extremity supported, Feet supported Sitting balance-Leahy Scale: Good     Standing balance support: Bilateral upper extremity supported, Reliant on assistive device for balance Standing balance-Leahy Scale: Poor                             ADL either performed or assessed with clinical judgement   ADL Overall ADL's : Needs assistance/impaired Eating/Feeding: Set up;Sitting   Grooming: Set up;Supervision/safety;Sitting   Upper Body Bathing: Set up;Supervision/ safety;Sitting   Lower Body Bathing: Minimal assistance;Sit to/from stand   Upper Body Dressing : Supervision/safety;Set up;Sitting   Lower Body Dressing: Moderate assistance Lower Body Dressing Details (indicate cue type and reason): min A sit<>stand Toilet Transfer: Minimal assistance;Ambulation;Rolling walker (2 wheels) Toilet Transfer Details (indicate cue type and reason): simulated bed>chair>door>chair Toileting- Clothing Manipulation and Hygiene: Moderate assistance Toileting - Clothing Manipulation Details (indicate cue type and reason): min A sit<>stand             Vision Baseline Vision/History:  (says he should wear glasses but cannot afford them) Ability to See in Adequate Light: 2 Moderately impaired Patient Visual Report: No change from baseline  Pertinent Vitals/Pain Pain Assessment Pain Assessment: Faces Faces Pain Scale: Hurts little more Pain Location: head Pain Descriptors / Indicators: Aching Pain Intervention(s): Limited activity within  patient's tolerance, Monitored during session     Extremity/Trunk Assessment Upper Extremity Assessment Upper Extremity Assessment: Overall WFL for tasks assessed           Communication Communication Communication: Impaired Factors Affecting Communication: Reduced clarity of speech   Cognition Arousal: Alert Behavior During Therapy: WFL for tasks assessed/performed Cognition: History of cognitive impairments                               Following commands: Intact       Cueing  General Comments   Cueing Techniques: Verbal cues  pt in NAD, but does have a fear of falling           Home Living Family/patient expects to be discharged to:: Unsure Living Arrangements: Spouse/significant other Available Help at Discharge: Family;Available 24 hours/day Type of Home: House Home Access: Stairs to enter [employer] of Steps: 8 Entrance Stairs-Rails: Right;Left Home Layout: One level     Bathroom Shower/Tub: Producer, Television/film/video: Standard     Home Equipment: Agricultural Consultant (2 wheels);Cane - single point;Wheelchair - manual          Prior Functioning/Environment Prior Level of Function : Needs assist             Mobility Comments: pt ambulates with a RW, reports requiring assistance to ambulate, history of falls ADLs Comments: assistance for all ADLs, bathes at sink sometimes gets in shower    OT Problem List: Impaired balance (sitting and/or standing);Impaired vision/perception;Decreased cognition;Decreased safety awareness;Pain   OT Treatment/Interventions: Self-care/ADL training;DME and/or AE instruction;Balance training;Patient/family education      OT Goals(Current goals can be found in the care plan section)   Acute Rehab OT Goals Patient Stated Goal: to not fall OT Goal Formulation: With patient Time For Goal Achievement: 11/02/24 Potential to Achieve Goals: Fair   OT Frequency:  Min 2X/week        AM-PAC OT 6 Clicks Daily Activity     Outcome Measure Help from another person eating meals?: A Little Help from another person taking care of personal grooming?: A Little Help from another person toileting, which includes using toliet, bedpan, or urinal?: A Little Help from another person bathing (including washing, rinsing, drying)?: A Little Help from another person to put on and taking off regular upper body clothing?: A Little Help from another person to put on and taking off regular lower body clothing?: A Lot 6 Click Score: 17   End of Session Equipment Utilized During Treatment: Gait belt;Rolling walker (2 wheels) Nurse Communication: Mobility status (lap belt chair alarm--told in person)  Activity Tolerance: Patient tolerated treatment well Patient left: in chair;with call bell/phone within reach;with chair alarm set (lap belt chair alarm)  OT Visit Diagnosis: Unsteadiness on feet (R26.81);Other abnormalities of gait and mobility (R26.89);Muscle weakness (generalized) (M62.81);Other symptoms and signs involving cognitive function;Pain Pain - part of body:  (headache (back of head))                Time: 9179-9158 OT Time Calculation (min): 21 min Charges:  OT General Charges $OT Visit: 1 Visit OT Evaluation $OT Eval Moderate Complexity: 1 Mod  Cathy L. OT Acute Rehabilitation Services Office [phone]    Rodgers Dorothyann Distel 10/19/2024, 10:54 AM

## 2024-10-20 ENCOUNTER — Other Ambulatory Visit (HOSPITAL_COMMUNITY): Payer: Self-pay

## 2024-10-20 MED ORDER — PHENOBARBITAL 64.8 MG PO TABS
64.8000 mg | ORAL_TABLET | Freq: Two times a day (BID) | ORAL | 0 refills | Status: AC
  Filled 2024-10-20: qty 180, 90d supply, fill #0

## 2024-10-20 MED ORDER — MELATONIN 3 MG PO TABS
3.0000 mg | ORAL_TABLET | Freq: Every day | ORAL | 0 refills | Status: AC
  Filled 2024-10-20: qty 90, 90d supply, fill #0

## 2024-10-20 MED ORDER — CITALOPRAM HYDROBROMIDE 20 MG PO TABS
20.0000 mg | ORAL_TABLET | Freq: Every day | ORAL | 0 refills | Status: AC
  Filled 2024-10-20: qty 30, 30d supply, fill #0

## 2024-10-20 MED ORDER — METOPROLOL TARTRATE 25 MG PO TABS
12.5000 mg | ORAL_TABLET | Freq: Two times a day (BID) | ORAL | 0 refills | Status: AC
  Filled 2024-10-20: qty 60, 60d supply, fill #0

## 2024-10-20 MED ORDER — ACETAMINOPHEN 500 MG PO TABS: 1000.0000 mg | ORAL_TABLET | Freq: Three times a day (TID) | ORAL | Status: AC

## 2024-10-20 MED ORDER — TRAZODONE HCL 100 MG PO TABS
100.0000 mg | ORAL_TABLET | Freq: Every day | ORAL | 0 refills | Status: AC
  Filled 2024-10-20: qty 30, 30d supply, fill #0

## 2024-10-20 MED ORDER — AMLODIPINE BESYLATE 2.5 MG PO TABS
2.5000 mg | ORAL_TABLET | Freq: Every day | ORAL | Status: DC
  Administered 2024-10-20: 2.5 mg via ORAL
  Filled 2024-10-20: qty 1

## 2024-10-20 MED ORDER — LEVOTHYROXINE SODIUM 150 MCG PO TABS
150.0000 ug | ORAL_TABLET | Freq: Every day | ORAL | 0 refills | Status: AC
  Filled 2024-10-20: qty 30, 30d supply, fill #0

## 2024-10-20 MED ORDER — SPIRIVA RESPIMAT 2.5 MCG/ACT IN AERS: 2.0000 | INHALATION_SPRAY | Freq: Every day | RESPIRATORY_TRACT | 0 refills | Status: AC

## 2024-10-20 MED ORDER — QUETIAPINE FUMARATE 25 MG PO TABS
25.0000 mg | ORAL_TABLET | Freq: Every day | ORAL | 0 refills | Status: AC
  Filled 2024-10-20: qty 30, 30d supply, fill #0

## 2024-10-20 MED ORDER — AMLODIPINE BESYLATE 2.5 MG PO TABS
2.5000 mg | ORAL_TABLET | Freq: Every day | ORAL | 0 refills | Status: AC
  Filled 2024-10-20: qty 30, 30d supply, fill #0

## 2024-10-20 MED ORDER — ALBUTEROL SULFATE HFA 108 (90 BASE) MCG/ACT IN AERS
2.0000 | INHALATION_SPRAY | RESPIRATORY_TRACT | 0 refills | Status: AC | PRN
  Filled 2024-10-20: qty 6.7, 25d supply, fill #0

## 2024-10-20 MED ORDER — LACOSAMIDE 200 MG PO TABS
200.0000 mg | ORAL_TABLET | Freq: Two times a day (BID) | ORAL | 0 refills | Status: AC
  Filled 2024-10-20: qty 60, 30d supply, fill #0

## 2024-10-20 MED ORDER — ALPRAZOLAM 0.5 MG PO TABS
0.5000 mg | ORAL_TABLET | Freq: Two times a day (BID) | ORAL | 0 refills | Status: AC | PRN
  Filled 2024-10-20: qty 10, 5d supply, fill #0

## 2024-10-20 NOTE — Discharge Summary (Signed)
 "  Physician Discharge Summary  [name] FMW:[id number] DOB: 01-30-1961 DOA: 10/16/2024  PCP: Rena Luke POUR, MD  Admit date: 10/16/2024 Discharge date: 10/20/2024  Admitted from: Home Discharge disposition: Home with home health services  Recommendations at discharge:  Ensure compliance to all medications including antiseizure medicines.  Need to follow-up with neurology as an outpatient for dose adjustment and refills. Metformin has been stopped Metoprolol  dose has been reduced to 12.5 mg twice daily.  Amlodipine  2.5 mg daily has been added for blood pressure control Continue neuropsych medicines as ordered for dementia, insomnia.. Continue Synthroid .  Needs thyroid  function test repeated in next few weeks with your primary care provider. Schedule Tylenol  1 g 3 times daily for pain control  Subjective: Patient was seen and examined this morning. Sitting up in bed.  Not in distress.  Alert, awake.  Not restless or agitated this morning. Afebrile, hemodynamically stable, breathing on room air. I called and spoke to his wife on the phone this morning.  Brief narrative: [name] is a 63 y.o. male with PMH significant for DM2, HTN, HLD, COPD, GERD, CHF, hypothyroidism. Patient lives at home, significantly limited in mobility, sometimes uses walker.  Wife has noticed worsening dementia..  1/23, patient was brought to the ED from home after seizure activity leading to ground-level fall. Seizure was witnessed by wife. Wife reported that he fell hard on the floor face forward Patient apparently ran out of phenobarbital  few days ago but was still taking Vimpat .   Since patient changed insurance this year, he was dropped by his old PCP and hence not been able to refill phenobarbital  for the last few days.  Wife tried but was not able to get in touch with neurology office.  Also reports he ran out of Synthroid  3 months ago.    In the ED, patient was afebrile, blood pressure 160s,  breathing on room air Labs with WBC count 9.1, hemoglobin 11.8, sodium 129, potassium 3, phenobarbital  level was low at 13, TSH elevated to 39 with free T4 and T3 significantly low. Urinalysis unremarkable  CT head did not show any acute intracranial findings, showed generalized atrophy and microvascular disease changes. He had mild right occipital scalp soft tissue swelling.   Maxillofacial CT showed an acute displaced fracture of the neck of the mandible on the left with dislocation of the left mandibular head and an associated 1 x 1.9 cm hematoma to the right of the mandibular symphysis.   EDP discussed with neurology who recommended resumption of his home AEDs.   Dr. Roark with ENT was also consulted Admitted to [facility]  Hospital course Breakthrough seizures  H/o seizure disorder Supposed to be on Vimpat  and phenobarbital  at home but apparently ran out of phenobarbital  for few days.  Presented with breakthrough seizure Phenobarbital  level was subtherapeutic as expected Vimpat  and phenobarbital  were both resumed.  No seizures since then.  Prescriptions sent for both. Seizure precautions reinforced   Acute mandibular fracture Secondary to fall after seizure Maxillofacial CT showed an acute displaced fracture of the neck of the mandible on the left with dislocation of the left mandibular head and an associated 1 x 1.9 cm hematoma to the right of the mandibular symphysis.  ENT Dr. Roark was consulted.  Per his note, since patient does not have any teeth and does not wear dentures, he is not in MMF.  He can stay on soft diet and follow-up as an outpatient.  Acute metabolic encephalopathy  Underlying dementia Anxiety/depression Based  on wife's account of patient's overall health, patient probably has progressive dementia.  Wife states that sometimes he gets bad sundowning.  He gets restless and verbally abusive. In the hospital, he has had episodes of agitation requiring IV Ativan , IV  Haldol . Home meds were gradually resumed. Currently on 4 bedtime pills -Seroquel  25 mg , melatonin 3 mg , Celexa  20 mg and trazodone  100 mg .  Also on Xanax  0.5 mg twice daily to as needed.   Continue the same at discharge.  Severe hypothyroidism Apparently ran out of Synthroid  few months ago. TSH elevated to 39 with free T4 and T3 significantly low. No signs of myxedema coma except for hyponatremia 1 dose of IV Synthroid  was given Currently on Synthroid  150 mcg daily. Normal random cortisol level Needs repeat thyroid  function test in next few weeks. Recent Labs    01/30/24 2032 02/03/24 0445 05/13/24 0841 10/16/24 1646  TSH 48.029* 30.797* 22.872* 39.200*    Hyponatremia Hypokalemia Hyponatremia likely due to severe hypothyroidism.  Gradually improving. Potassium level improved with replacement. Recent Labs  Lab 10/16/24 1646 10/17/24 0439 10/19/24 0127  NA 129* 130* 131*  K 3.0* 3.3* 4.5  CL 91* 93* 98  CO2 21* 28 23  GLUCOSE 123* 87 108*  BUN 11 13 14   CREATININE 0.78 0.84 0.81  CALCIUM  9.0 8.7* 8.2*   Type 2 diabetes mellitus A1c 6.1 in 01/31/2024.   PTA meds- metformin.  Currently on hold.  His A1c is well-controlled.  I do not see a need to resume metformin at discharge Recent Labs  Lab 10/16/24 1637 10/17/24 1448  GLUCAP 133* 107*   HTN Sinus bradycardia PTA med list showed metoprolol  25 mg twice daily.  He was resumed on that but his heart rate started to dip down to 40s and hence metoprolol  dose was reduced to 12.5 mg twice daily. Heart rate and blood pressure fluctuating but blood pressure is mostly in 150s.  I have added 2.5 mg of amlodipine .  COPD Continue bronchodilators  Impaired mobility Osteoarthritis Needs hip replacement but has not been able to get yet. Mobility limited at baseline.  Further limited with hospitalization.  Was seen by PT.  Recommended SNF but patient apparently had a bad experience at one of the SNFs.  He wants to return  home at discharge. Last seen by PT yesterday 1/26.  Patient was fearful of falling but yet was able to ambulate 40 feet with rolling walker and assistance. Home health services ordered. Pain regimen --- Scheduled: Tylenol  1 g 3 times daily --- PRN:   PT Follow up Rec: Home Health Pt (Per Toc Note, Pt/Wife Refusing Snf Which Would Be Safest Option)10/19/2024 1009    Goals of care   Code Status: Full Code   Diet:  Diet Order             Diet general           DIET DYS 3 Room service appropriate? No; Fluid consistency: Thin  Diet effective 1400                   Nutritional status:  Body mass index is 28.19 kg/m.       Wounds:  - Wound 10/17/24 1540 Other (Comment) Head Posterior;Right (Active)  Date First Assessed/Time First Assessed: 10/17/24 1540   Present on Original Admission: Yes  Primary Wound Type: (c) Other (Comment)  Location: Head  Location Orientation: Posterior;Right    Assessments 10/17/2024  3:40 PM 10/19/2024  7:53  PM  Wound Image     Site / Wound Assessment Dry;Other (Comment) Dry  Peri-wound Assessment Intact Intact  Wound Length (cm) 1 cm --  Wound Width (cm) 1 cm --  Wound Surface Area (cm^2) 0.79 cm^2 --  Wound Depth (cm) 0 cm --  Wound Volume (cm^3) 0 cm^3 --  Drainage Amount None None  Treatments Site care Site care  Dressing Type None None     No associated orders.    Discharge Medications:   Allergies as of 10/20/2024       Reactions   Levetiracetam  Other (See Comments)   Severe anxiety, agitation, hyperactivity, very negative disposition   Tegretol [carbamazepine] Anxiety, Other (See Comments)   Increased seizures/ Induces seizures   Penicillins Other (See Comments)   Reaction not noted   Topiramate Other (See Comments)   Reaction not noted        Medication List     STOP taking these medications    metFORMIN 500 MG 24 hr tablet Commonly known as: GLUCOPHAGE-XR       TAKE these medications    acetaminophen  500 MG  tablet Commonly known as: TYLENOL  Take 2 tablets (1,000 mg total) by mouth 3 (three) times daily.   albuterol  108 (90 Base) MCG/ACT inhaler Commonly known as: VENTOLIN  HFA Inhale 2 puffs into the lungs every 4 (four) hours as needed for wheezing or shortness of breath.   ALPRAZolam  0.5 MG tablet Commonly known as: XANAX  Take 1 tablet (0.5 mg total) by mouth 2 (two) times daily as needed for anxiety. What changed:  when to take this reasons to take this Another medication with the same name was removed. Continue taking this medication, and follow the directions you see here.   amLODipine  2.5 MG tablet Commonly known as: NORVASC  Take 1 tablet (2.5 mg total) by mouth daily.   citalopram  20 MG tablet Commonly known as: CELEXA  Take 1 tablet (20 mg total) by mouth at bedtime.   lacosamide  200 MG Tabs tablet Commonly known as: VIMPAT  Take 1 tablet (200 mg total) by mouth 2 (two) times daily.   levothyroxine  150 MCG tablet Commonly known as: SYNTHROID  Take 1 tablet (150 mcg total) by mouth daily before breakfast.   melatonin 3 MG Tabs tablet Take 1 tablet (3 mg total) by mouth at bedtime.   metoprolol  tartrate 25 MG tablet Commonly known as: LOPRESSOR  Take 0.5 tablets (12.5 mg total) by mouth 2 (two) times daily. What changed: how much to take   PHENobarbital  64.8 MG tablet Commonly known as: LUMINAL Take 1 tablet (64.8 mg total) by mouth 2 (two) times daily.   QUEtiapine  25 MG tablet Commonly known as: SEROQUEL  Take 1 tablet (25 mg total) by mouth at bedtime.   Spiriva  Respimat 2.5 MCG/ACT Aers Generic drug: Tiotropium Bromide  Inhale 2 puffs into the lungs daily.   traZODone  100 MG tablet Commonly known as: DESYREL  Take 1 tablet (100 mg total) by mouth at bedtime.               Discharge Care Instructions  (From admission, onward)           Start     Ordered   10/20/24 0000  Discharge wound care:        10/20/24 1042             Follow ups:     Follow-up Information     Roark Rush, MD Follow up.   Specialty: Otolaryngology Why: Call for appointment 1-2 weeks  after discharge Contact information: [address], [address] [place] [address] [phone]         Rena Luke POUR, MD Follow up.   Specialty: Family Medicine Contact information: [address] [place] [address] [phone]         [facility] Follow up.   Specialty: Neurology Contact information: [address] [place]  [address] [phone]                Discharge Instructions:   Discharge Instructions     Call MD for:  difficulty breathing, headache or visual disturbances   Complete by: As directed    Call MD for:  extreme fatigue   Complete by: As directed    Call MD for:  hives   Complete by: As directed    Call MD for:  persistant dizziness or light-headedness   Complete by: As directed    Call MD for:  persistant nausea and vomiting   Complete by: As directed    Call MD for:  severe uncontrolled pain   Complete by: As directed    Call MD for:  temperature >100.4   Complete by: As directed    Diet general   Complete by: As directed    Discharge instructions   Complete by: As directed    Recommendations at discharge:   Ensure compliance to all medications including antiseizure medicines.  Need to follow-up with neurology as an outpatient for dose adjustment and refills.  Metformin has been stopped  Metoprolol  dose has been reduced to 12.5 mg twice daily.  Amlodipine  2.5 mg daily has been added for blood pressure control  Continue neuropsych medicines as ordered for dementia, insomnia..  Continue Synthroid .  Needs thyroid  function test repeated in next few weeks with your primary care provider.  Schedule Tylenol  1 g 3 times daily for pain control  Seizure precautions: -Per [place]  DMV statutes, patients with seizures  are not allowed to drive until they have been seizure-free for six months.  -Use caution when using heavy equipment or power tools.  -Avoid working on ladders or at heights.  -Take showers instead of baths. Ensure the water  temperature is not too high on the home water  heater.  -Do not go swimming alone.  -Do not lock yourself in a room alone (i.e. bathroom). -When caring for infants or small children, sit down when holding, feeding, or changing them to minimize risk of injury to the child in the event you have a seizure.  -Maintain good sleep hygiene. -Avoid alcohol.    If patient has another seizure, call 911 and bring them back to the ED if: A.  The seizure lasts longer than 5 minutes.      B.  The patient doesn't wake shortly after the seizure or has new problems such as difficulty seeing, speaking or moving following the seizure C.  The patient was injured during the seizure D.  The patient has a temperature over 102 F (39C) E.  The patient vomited during the seizure and now is having trouble breathing      General discharge instructions: Follow with Primary MD Rena Luke POUR, MD in 7 days  Please request your PCP  to go over your hospital tests, procedures, radiology results at the follow up. Please get your medicines reviewed and adjusted.  Your PCP may decide to repeat certain labs or tests as needed. Do not drive, operate heavy machinery, perform  activities at heights, swimming or participation in water  activities or provide baby sitting services if your were admitted for syncope or siezures until you have seen by Primary MD or a Neurologist and advised to do so again. [place]  Controlled Substance Reporting System database was reviewed. Do not drive, operate heavy machinery, perform activities at heights, swim, participate in water  activities or provide baby-sitting services while on medications for pain, sleep and mood until your outpatient physician has reevaluated you and  advised to do so again.  You are strongly recommended to comply with the dose, frequency and duration of prescribed medications. Activity: As tolerated with Full fall precautions use walker/cane & assistance as needed Avoid using any recreational substances like cigarette, tobacco, alcohol, or non-prescribed drug. If you experience worsening of your admission symptoms, develop shortness of breath, life threatening emergency, suicidal or homicidal thoughts you must seek medical attention immediately by calling 911 or calling your MD immediately  if symptoms less severe. You must read complete instructions/literature along with all the possible adverse reactions/side effects for all the medicines you take and that have been prescribed to you. Take any new medicine only after you have completely understood and accepted all the possible adverse reactions/side effects.  Wear Seat belts while driving. You were cared for by a hospitalist during your hospital stay. If you have any questions about your discharge medications or the care you received while you were in the hospital after you are discharged, you can call the unit and ask to speak with the hospitalist or the covering physician. Once you are discharged, your primary care physician will handle any further medical issues. Please note that NO REFILLS for any discharge medications will be authorized once you are discharged, as it is imperative that you return to your primary care physician (or establish a relationship with a primary care physician if you do not have one).   Discharge wound care:   Complete by: As directed    Increase activity slowly   Complete by: As directed        Discharge Exam:   Vitals:   10/19/24 2335 10/20/24 0409 10/20/24 0806 10/20/24 0835  BP: 134/67 (!) 140/77 (!) 154/73   Pulse: 61 (!) 54 66   Resp:  19 16   Temp: 97.8 F (36.6 C) 97.7 F (36.5 C) 98.5 F (36.9 C)   TempSrc: Oral Oral    SpO2: 98% 97% 97% 96%   Weight:      Height:        Body mass index is 28.19 kg/m.  General exam: Pleasant, elderly Caucasian male.  Not in distress Skin: No rashes, lesions or ulcers. HEENT: Atraumatic, normocephalic, no obvious bleeding Lungs: Clear to auscultation bilaterally,  CVS: S1, S2, no murmur,   GI/Abd: Soft, nontender, nondistended, bowel sound present,   CNS: Alert, awake, oriented to place only. Not restless or agitated at this time. Extremities: No pedal edema, no calf tenderness,    The results of significant diagnostics from this hospitalization (including imaging, microbiology, ancillary and laboratory) are listed below for reference.    Procedures and Diagnostic Studies:   DG Pelvis Portable Result Date: 10/16/2024 EXAM: 1 or 2 VIEW(S) XRAY OF THE PELVIS 10/16/2024 05:50:00 PM COMPARISON: 05/13/2024 CLINICAL HISTORY: Patient fell. FINDINGS: BONES AND JOINTS: No acute fracture. No malalignment. Osteophytosis of the superior acetabulum. Mild degenerative changes of bilateral hip joints with mild joint space narrowing. SOFT TISSUES: Unremarkable. IMPRESSION: 1. No evidence of acute traumatic injury. Electronically signed  by: Elsie Gravely MD 10/16/2024 06:12 PM EST RP Workstation: [id number]   DG Chest Portable 1 View Result Date: 10/16/2024 EXAM: 1 VIEW(S) XRAY OF THE CHEST 10/16/2024 05:50:00 PM COMPARISON: 09/30/2024 CLINICAL HISTORY: Patient fell down. FINDINGS: LUNGS AND PLEURA: No focal pulmonary opacity. No pleural effusion. No pneumothorax. HEART AND MEDIASTINUM: No acute abnormality of the cardiac and mediastinal silhouettes. BONES AND SOFT TISSUES: Old left humeral neck fracture. Degenerative changes in the spine and shoulders. No acute osseous abnormality. IMPRESSION: 1. No acute cardiopulmonary abnormality. 2. Chronic healed left humeral neck fracture. Electronically signed by: Elsie Gravely MD 10/16/2024 06:11 PM EST RP Workstation: [id number]   CT Cervical Spine Wo  Contrast Addendum Date: 10/16/2024 ADDENDUM REPORT: 10/16/2024 17:54 ADDENDUM: Upon further evaluation, an acute, displaced fracture deformity is seen extending through the neck of the mandible on the left. Dislocation of the left mandibular head is also noted. Electronically Signed   By: Suzen Dials M.D.   On: 10/16/2024 17:54   Result Date: 10/16/2024 CLINICAL DATA:  Status post trauma. EXAM: CT CERVICAL SPINE WITHOUT CONTRAST TECHNIQUE: Multidetector CT imaging of the cervical spine was performed without intravenous contrast. Multiplanar CT image reconstructions were also generated. RADIATION DOSE REDUCTION: This exam was performed according to the departmental dose-optimization program which includes automated exposure control, adjustment of the mA and/or kV according to patient size and/or use of iterative reconstruction technique. COMPARISON:  September 30, 2024 FINDINGS: Alignment: Normal. Skull base and vertebrae: No acute fracture. No primary bone lesion or focal pathologic process. Soft tissues and spinal canal: No prevertebral fluid or swelling. No visible canal hematoma. Disc levels: Mild anterior osteophyte formation is seen at the levels of C5-C6 and C6-C7. Mild multilevel intervertebral disc space narrowing is seen throughout the cervical spine. Bilateral moderate to marked severity multilevel facet joint hypertrophy is noted. Upper chest: There is evidence of paraseptal and centrilobular emphysematous lung disease. Other: None. IMPRESSION: 1. No acute fracture or subluxation in the cervical spine. 2. Moderate severity multilevel degenerative changes. 3. Emphysematous lung disease. Electronically Signed: By: Suzen Dials M.D. On: 10/16/2024 17:46   CT Head Wo Contrast Addendum Date: 10/16/2024 ADDENDUM REPORT: 10/16/2024 17:53 ADDENDUM: Upon further evaluation, acute, displaced fracture deformity is seen extending through the neck of the mandible on the left. Dislocation of the left  mandibular head is also noted. Electronically Signed   By: Suzen Dials M.D.   On: 10/16/2024 17:53   Result Date: 10/16/2024 CLINICAL DATA:  Status post trauma. EXAM: CT HEAD WITHOUT CONTRAST TECHNIQUE: Contiguous axial images were obtained from the base of the skull through the vertex without intravenous contrast. RADIATION DOSE REDUCTION: This exam was performed according to the departmental dose-optimization program which includes automated exposure control, adjustment of the mA and/or kV according to patient size and/or use of iterative reconstruction technique. COMPARISON:  None Available. FINDINGS: Brain: There is generalized cerebral atrophy with widening of the extra-axial spaces and ventricular dilatation. There are areas of decreased attenuation within the white matter tracts of the supratentorial brain, consistent with microvascular disease changes. Vascular: No hyperdense vessel or unexpected calcification. Skull: Right frontal parietal and right parieto-occipital burr holes are seen. Sinuses/Orbits: Mild sphenoid sinus mucosal thickening is noted. Other: There is mild right occipital scalp soft tissue swelling. IMPRESSION: 1. Generalized cerebral atrophy and microvascular disease changes of the supratentorial brain. 2. Mild right occipital scalp soft tissue swelling without evidence of an acute fracture or acute intracranial abnormality. 3. Mild sphenoid sinus disease. Electronically Signed: By: Suzen  Houston M.D. On: 10/16/2024 17:40   CT Maxillofacial Wo Contrast Result Date: 10/16/2024 CLINICAL DATA:  Facial trauma. EXAM: CT MAXILLOFACIAL WITHOUT CONTRAST TECHNIQUE: Multidetector CT imaging of the maxillofacial structures was performed. Multiplanar CT image reconstructions were also generated. RADIATION DOSE REDUCTION: This exam was performed according to the departmental dose-optimization program which includes automated exposure control, adjustment of the mA and/or kV according to  patient size and/or use of iterative reconstruction technique. COMPARISON:  None Available. FINDINGS: Osseous: An acute displaced fracture deformity is seen extending through the neck of the mandible on the left. Dislocation of the left mandibular head is also noted. Orbits: Negative. No traumatic or inflammatory finding. Sinuses: Clear. Soft tissues: Anterior para mandibular soft tissue swelling is noted bilaterally with an associated 1.0 cm x 1.9 cm hematoma seen to the right of the mandibular symphysis (axial CT image 14, CT series 7). Mild soft tissue swelling is seen along the posterior fossa on the left. Limited intracranial: No significant or unexpected finding. IMPRESSION: 1. Acute displaced fracture of the neck of the mandible on the left with dislocation of the left mandibular head. 2. Anterior para mandibular soft tissue swelling with an associated 1.0 cm x 1.9 cm hematoma seen to the right of the mandibular symphysis. Correlation with physical examination is recommended to exclude the presence of a soft tissue mass. Electronically Signed   By: Suzen Dials M.D.   On: 10/16/2024 17:52     Labs:   Basic Metabolic Panel: Recent Labs  Lab 10/16/24 1646 10/17/24 0439 10/19/24 0127  NA 129* 130* 131*  K 3.0* 3.3* 4.5  CL 91* 93* 98  CO2 21* 28 23  GLUCOSE 123* 87 108*  BUN 11 13 14   CREATININE 0.78 0.84 0.81  CALCIUM  9.0 8.7* 8.2*   GFR Estimated Creatinine Clearance: 93 mL/min (by C-G formula based on SCr of 0.81 mg/dL). Liver Function Tests: Recent Labs  Lab 10/16/24 1646 10/17/24 0439  AST 42* 34  ALT 26 23  ALKPHOS 114 101  BILITOT 0.8 0.6  PROT 7.8 7.2  ALBUMIN 4.6 4.3   No results for input(s): LIPASE, AMYLASE in the last 168 hours. No results for input(s): AMMONIA in the last 168 hours. Coagulation profile Recent Labs  Lab 10/16/24 1646  INR 1.0    CBC: Recent Labs  Lab 10/16/24 1646 10/17/24 0439 10/19/24 0127  WBC 9.1 7.5 8.4  NEUTROABS 7.0   --  3.8  HGB 11.8* 10.5* 9.9*  HCT 32.8* 29.7* 28.5*  MCV 86.8 89.7 91.6  PLT 279 264 255   Cardiac Enzymes: No results for input(s): CKTOTAL, CKMB, CKMBINDEX, TROPONINI in the last 168 hours. BNP: Invalid input(s): POCBNP CBG: Recent Labs  Lab 10/16/24 1637 10/17/24 1448  GLUCAP 133* 107*   D-Dimer No results for input(s): DDIMER in the last 72 hours. Hgb A1c No results for input(s): HGBA1C in the last 72 hours. Lipid Profile No results for input(s): CHOL, HDL, LDLCALC, TRIG, CHOLHDL, LDLDIRECT in the last 72 hours. Thyroid  function studies No results for input(s): TSH, T4TOTAL, T3FREE, THYROIDAB in the last 72 hours.  Invalid input(s): FREET3 Anemia work up No results for input(s): VITAMINB12, FOLATE, FERRITIN, TIBC, IRON, RETICCTPCT in the last 72 hours. Microbiology No results found for this or any previous visit (from the past 240 hours).  Time coordinating discharge: 45 minutes  Signed: Ajay Strubel  [facility] 10/20/2024, 10:42 AM  "

## 2024-10-20 NOTE — Plan of Care (Signed)
 Was anxious, agitated through out the day. Wife by side. Waiting for [facility] to get to home.   Problem: Education: Goal: Knowledge of General Education information will improve Description: Including pain rating scale, medication(s)/side effects and non-pharmacologic comfort measures Outcome: Progressing   Problem: Health Behavior/Discharge Planning: Goal: Ability to manage health-related needs will improve Outcome: Progressing   Problem: Clinical Measurements: Goal: Will remain free from infection Outcome: Progressing   Problem: Clinical Measurements: Goal: Respiratory complications will improve Outcome: Progressing   Problem: Activity: Goal: Risk for activity intolerance will decrease Outcome: Progressing   Problem: Nutrition: Goal: Adequate nutrition will be maintained Outcome: Progressing   Problem: Coping: Goal: Level of anxiety will decrease Outcome: Progressing   Problem: Pain Managment: Goal: General experience of comfort will improve and/or be controlled Outcome: Progressing   Problem: Safety: Goal: Ability to remain free from injury will improve Outcome: Progressing   Problem: Skin Integrity: Goal: Risk for impaired skin integrity will decrease Outcome: Progressing   Problem: Safety: Goal: Ability to remain free from injury will improve Outcome: Progressing

## 2024-10-20 NOTE — Plan of Care (Signed)

## 2024-10-20 NOTE — Progress Notes (Signed)
 Patient discharging home via [facility].

## 2024-10-20 NOTE — TOC Progression Note (Addendum)
 Transition of Care [facility]) - Progression Note    Patient Details  Name: [name] MRN: [id number] Date of Birth: 10/13/1960  Transition of [facility]) CM/SW Contact  Nola Devere Hands, RN Phone Number: 10/20/2024, 11:29 AM  Clinical Narrative:    Ase Manager has spoken to patient's wife concerning discharge plan and need for [facility] therapy. No preference, referrals sent via HUB, waiting for responses. She states that she can not come get patient because of ice, driveway blocked and she has bad hip and other medical issues and can not handle shoveling or trying to move ice. Wants patient transported via [facility], but they wont walk up driveway of ice. CM has updated MD. Wife and patient upset that he can't get home, she states she will keep trying, CM did ask if she could reach out to a neighbr for assistance.   12:52:[facility] accepted patient for HHPT/OT. Added to AVS.     Expected Discharge Plan: Home w Home Health Services Barriers to Discharge: Continued Medical Work up               Expected Discharge Plan and Services In-house Referral: Clinical Social Work   Post Acute Care Choice: Skilled Nursing Facility Living arrangements for the past 2 months: Single Family Home Expected Discharge Date: 10/20/24                                     Social Drivers of Health (SDOH) Interventions SDOH Screenings   Food Insecurity: Patient Unable To Answer (10/18/2024)  Housing: Low Risk (03/30/2024)   Received from [facility]  Transportation Needs: No Transportation Needs (03/30/2024)   Received from [facility]  Utilities: Not At Risk (03/30/2024)   Received from [facility]  Financial Resource Strain: Low Risk (03/30/2024)   Received from [facility]  Physical Activity: Unknown (03/27/2023)   Received from [facility]  Social Connections: Patient Unable To Answer (02/29/2024)  Stress: Stress Concern Present (03/27/2023)   Received from [facility]  Tobacco  Use: Medium Risk (05/26/2024)    Readmission Risk Interventions     No data to display

## 2024-10-21 LAB — LACOSAMIDE: Lacosamide: 7.4 ug/mL (ref 5.0–10.0)

## 2024-10-21 NOTE — Care Management Important Message (Addendum)
 Important Message  Patient Details  Name: [name] MRN: [id number] Date of Birth: Aug 05, 1961   Important Message Given:  Yes - Medicare IM  IM was given on 09/19/2026   Claretta Deed 10/21/2024, 8:24 AM

## 2024-12-16 ENCOUNTER — Ambulatory Visit: Admitting: Neurology
